# Patient Record
Sex: Female | Born: 1975
Health system: Southern US, Community
[De-identification: ages and names within clinical notes are randomized; demographics above are authoritative.]

## PROBLEM LIST (undated history)

## (undated) DIAGNOSIS — F32A Depression, unspecified: Secondary | ICD-10-CM

## (undated) DIAGNOSIS — J45909 Unspecified asthma, uncomplicated: Secondary | ICD-10-CM

## (undated) DIAGNOSIS — R87629 Unspecified abnormal cytological findings in specimens from vagina: Secondary | ICD-10-CM

## (undated) DIAGNOSIS — G459 Transient cerebral ischemic attack, unspecified: Secondary | ICD-10-CM

## (undated) DIAGNOSIS — F419 Anxiety disorder, unspecified: Secondary | ICD-10-CM

## (undated) DIAGNOSIS — M549 Dorsalgia, unspecified: Secondary | ICD-10-CM

## (undated) DIAGNOSIS — E119 Type 2 diabetes mellitus without complications: Secondary | ICD-10-CM

## (undated) DIAGNOSIS — G43109 Migraine with aura, not intractable, without status migrainosus: Secondary | ICD-10-CM

## (undated) DIAGNOSIS — E785 Hyperlipidemia, unspecified: Secondary | ICD-10-CM

## (undated) DIAGNOSIS — L732 Hidradenitis suppurativa: Secondary | ICD-10-CM

## (undated) HISTORY — DX: Depression, unspecified: F32.A

## (undated) HISTORY — DX: Migraine with aura, not intractable, without status migrainosus: G43.109

## (undated) HISTORY — PX: CERVICAL BIOPSY  W/ LOOP ELECTRODE EXCISION: SUR135

## (undated) HISTORY — DX: Type 2 diabetes mellitus without complications: E11.9

## (undated) HISTORY — PX: LEEP: SHX91

## (undated) HISTORY — DX: Hyperlipidemia, unspecified: E78.5

## (undated) HISTORY — DX: Anxiety disorder, unspecified: F41.9

## (undated) HISTORY — DX: Unspecified abnormal cytological findings in specimens from vagina: R87.629

---

## 1998-01-22 ENCOUNTER — Emergency Department (HOSPITAL_COMMUNITY): Admission: EM | Admit: 1998-01-22 | Discharge: 1998-01-22 | Payer: Self-pay | Admitting: Emergency Medicine

## 1998-07-03 ENCOUNTER — Emergency Department (HOSPITAL_COMMUNITY): Admission: EM | Admit: 1998-07-03 | Discharge: 1998-07-03 | Payer: Self-pay | Admitting: Emergency Medicine

## 1998-09-29 ENCOUNTER — Encounter: Admission: RE | Admit: 1998-09-29 | Discharge: 1998-09-29 | Payer: Self-pay | Admitting: Obstetrics

## 1999-02-11 ENCOUNTER — Inpatient Hospital Stay (HOSPITAL_COMMUNITY): Admission: AD | Admit: 1999-02-11 | Discharge: 1999-02-11 | Payer: Self-pay | Admitting: *Deleted

## 1999-03-19 ENCOUNTER — Inpatient Hospital Stay (HOSPITAL_COMMUNITY): Admission: AD | Admit: 1999-03-19 | Discharge: 1999-03-19 | Payer: Self-pay | Admitting: Obstetrics & Gynecology

## 1999-07-25 ENCOUNTER — Inpatient Hospital Stay (HOSPITAL_COMMUNITY): Admission: AD | Admit: 1999-07-25 | Discharge: 1999-07-25 | Payer: Self-pay | Admitting: Obstetrics & Gynecology

## 1999-07-27 ENCOUNTER — Inpatient Hospital Stay (HOSPITAL_COMMUNITY): Admission: AD | Admit: 1999-07-27 | Discharge: 1999-07-27 | Payer: Self-pay | Admitting: Obstetrics & Gynecology

## 2001-06-25 ENCOUNTER — Emergency Department (HOSPITAL_COMMUNITY): Admission: EM | Admit: 2001-06-25 | Discharge: 2001-06-25 | Payer: Self-pay | Admitting: Emergency Medicine

## 2001-11-07 ENCOUNTER — Encounter: Payer: Self-pay | Admitting: Emergency Medicine

## 2001-11-07 ENCOUNTER — Emergency Department (HOSPITAL_COMMUNITY): Admission: EM | Admit: 2001-11-07 | Discharge: 2001-11-08 | Payer: Self-pay | Admitting: Emergency Medicine

## 2002-03-09 ENCOUNTER — Emergency Department (HOSPITAL_COMMUNITY): Admission: EM | Admit: 2002-03-09 | Discharge: 2002-03-09 | Payer: Self-pay

## 2002-08-07 ENCOUNTER — Emergency Department (HOSPITAL_COMMUNITY): Admission: EM | Admit: 2002-08-07 | Discharge: 2002-08-07 | Payer: Self-pay | Admitting: Emergency Medicine

## 2002-11-22 ENCOUNTER — Emergency Department (HOSPITAL_COMMUNITY): Admission: EM | Admit: 2002-11-22 | Discharge: 2002-11-23 | Payer: Self-pay | Admitting: Emergency Medicine

## 2003-03-08 ENCOUNTER — Emergency Department (HOSPITAL_COMMUNITY): Admission: EM | Admit: 2003-03-08 | Discharge: 2003-03-08 | Payer: Self-pay | Admitting: Emergency Medicine

## 2003-08-03 ENCOUNTER — Encounter: Admission: RE | Admit: 2003-08-03 | Discharge: 2003-08-03 | Payer: Self-pay | Admitting: Obstetrics and Gynecology

## 2003-08-03 ENCOUNTER — Other Ambulatory Visit: Admission: RE | Admit: 2003-08-03 | Discharge: 2003-08-03 | Payer: Self-pay | Admitting: Obstetrics & Gynecology

## 2003-08-03 ENCOUNTER — Encounter (INDEPENDENT_AMBULATORY_CARE_PROVIDER_SITE_OTHER): Payer: Self-pay | Admitting: *Deleted

## 2004-01-16 ENCOUNTER — Emergency Department (HOSPITAL_COMMUNITY): Admission: EM | Admit: 2004-01-16 | Discharge: 2004-01-16 | Payer: Self-pay | Admitting: Family Medicine

## 2004-01-17 ENCOUNTER — Inpatient Hospital Stay (HOSPITAL_COMMUNITY): Admission: AD | Admit: 2004-01-17 | Discharge: 2004-01-17 | Payer: Self-pay | Admitting: Family Medicine

## 2004-02-08 ENCOUNTER — Emergency Department (HOSPITAL_COMMUNITY): Admission: EM | Admit: 2004-02-08 | Discharge: 2004-02-08 | Payer: Self-pay | Admitting: *Deleted

## 2004-06-13 ENCOUNTER — Ambulatory Visit: Payer: Self-pay | Admitting: Obstetrics & Gynecology

## 2004-08-08 ENCOUNTER — Ambulatory Visit: Payer: Self-pay | Admitting: Obstetrics & Gynecology

## 2004-08-08 ENCOUNTER — Other Ambulatory Visit: Admission: RE | Admit: 2004-08-08 | Discharge: 2004-08-08 | Payer: Self-pay | Admitting: Obstetrics & Gynecology

## 2004-08-16 ENCOUNTER — Inpatient Hospital Stay (HOSPITAL_COMMUNITY): Admission: AD | Admit: 2004-08-16 | Discharge: 2004-08-16 | Payer: Self-pay | Admitting: Obstetrics and Gynecology

## 2004-08-22 ENCOUNTER — Ambulatory Visit: Payer: Self-pay | Admitting: Obstetrics and Gynecology

## 2004-09-24 ENCOUNTER — Emergency Department (HOSPITAL_COMMUNITY): Admission: EM | Admit: 2004-09-24 | Discharge: 2004-09-24 | Payer: Self-pay | Admitting: Emergency Medicine

## 2004-12-12 ENCOUNTER — Ambulatory Visit: Payer: Self-pay | Admitting: Obstetrics and Gynecology

## 2005-06-04 ENCOUNTER — Emergency Department (HOSPITAL_COMMUNITY): Admission: EM | Admit: 2005-06-04 | Discharge: 2005-06-05 | Payer: Self-pay | Admitting: Emergency Medicine

## 2005-06-12 ENCOUNTER — Encounter (INDEPENDENT_AMBULATORY_CARE_PROVIDER_SITE_OTHER): Payer: Self-pay | Admitting: *Deleted

## 2005-06-12 ENCOUNTER — Ambulatory Visit: Payer: Self-pay | Admitting: Obstetrics & Gynecology

## 2006-05-14 ENCOUNTER — Emergency Department (HOSPITAL_COMMUNITY): Admission: EM | Admit: 2006-05-14 | Discharge: 2006-05-14 | Payer: Self-pay | Admitting: Emergency Medicine

## 2006-05-16 ENCOUNTER — Inpatient Hospital Stay (HOSPITAL_COMMUNITY): Admission: AD | Admit: 2006-05-16 | Discharge: 2006-05-16 | Payer: Self-pay | Admitting: Obstetrics and Gynecology

## 2006-05-30 ENCOUNTER — Ambulatory Visit: Payer: Self-pay | Admitting: Obstetrics and Gynecology

## 2006-06-26 ENCOUNTER — Ambulatory Visit (HOSPITAL_COMMUNITY): Admission: RE | Admit: 2006-06-26 | Discharge: 2006-06-26 | Payer: Self-pay | Admitting: Obstetrics and Gynecology

## 2006-10-31 ENCOUNTER — Emergency Department (HOSPITAL_COMMUNITY): Admission: EM | Admit: 2006-10-31 | Discharge: 2006-10-31 | Payer: Self-pay | Admitting: Family Medicine

## 2007-02-13 ENCOUNTER — Emergency Department (HOSPITAL_COMMUNITY): Admission: EM | Admit: 2007-02-13 | Discharge: 2007-02-13 | Payer: Self-pay | Admitting: Emergency Medicine

## 2008-11-01 ENCOUNTER — Emergency Department (HOSPITAL_COMMUNITY): Admission: EM | Admit: 2008-11-01 | Discharge: 2008-11-01 | Payer: Self-pay | Admitting: Emergency Medicine

## 2009-07-08 ENCOUNTER — Emergency Department (HOSPITAL_COMMUNITY): Admission: EM | Admit: 2009-07-08 | Discharge: 2009-07-08 | Payer: Self-pay | Admitting: Emergency Medicine

## 2009-07-11 ENCOUNTER — Emergency Department (HOSPITAL_COMMUNITY): Admission: EM | Admit: 2009-07-11 | Discharge: 2009-07-11 | Payer: Self-pay | Admitting: Emergency Medicine

## 2009-08-01 ENCOUNTER — Emergency Department (HOSPITAL_COMMUNITY): Admission: EM | Admit: 2009-08-01 | Discharge: 2009-08-01 | Payer: Self-pay | Admitting: Emergency Medicine

## 2009-11-14 ENCOUNTER — Emergency Department (HOSPITAL_COMMUNITY): Admission: EM | Admit: 2009-11-14 | Discharge: 2009-11-14 | Payer: Self-pay | Admitting: Emergency Medicine

## 2009-11-15 ENCOUNTER — Emergency Department (HOSPITAL_COMMUNITY): Admission: EM | Admit: 2009-11-15 | Discharge: 2009-11-15 | Payer: Self-pay | Admitting: Emergency Medicine

## 2009-12-29 ENCOUNTER — Ambulatory Visit (HOSPITAL_COMMUNITY): Admission: RE | Admit: 2009-12-29 | Discharge: 2009-12-29 | Payer: Self-pay | Admitting: Specialist

## 2010-02-14 ENCOUNTER — Emergency Department (HOSPITAL_COMMUNITY): Admission: EM | Admit: 2010-02-14 | Discharge: 2010-02-14 | Payer: Self-pay | Admitting: Family Medicine

## 2010-08-14 ENCOUNTER — Emergency Department (HOSPITAL_COMMUNITY)
Admission: EM | Admit: 2010-08-14 | Discharge: 2010-08-14 | Payer: Self-pay | Source: Home / Self Care | Admitting: Emergency Medicine

## 2010-08-24 ENCOUNTER — Emergency Department (HOSPITAL_COMMUNITY)
Admission: EM | Admit: 2010-08-24 | Discharge: 2010-08-24 | Payer: Self-pay | Source: Home / Self Care | Admitting: Emergency Medicine

## 2010-09-24 ENCOUNTER — Emergency Department (HOSPITAL_COMMUNITY)
Admission: EM | Admit: 2010-09-24 | Discharge: 2010-09-24 | Payer: Self-pay | Source: Home / Self Care | Admitting: Emergency Medicine

## 2010-09-30 ENCOUNTER — Encounter: Payer: Self-pay | Admitting: *Deleted

## 2010-10-01 ENCOUNTER — Encounter: Payer: Self-pay | Admitting: Specialist

## 2010-10-19 ENCOUNTER — Inpatient Hospital Stay (HOSPITAL_COMMUNITY)
Admission: AD | Admit: 2010-10-19 | Discharge: 2010-10-19 | Disposition: A | Discharge status: Home / Self Care | Payer: Medicaid Other | Source: Ambulatory Visit | Attending: Obstetrics & Gynecology | Admitting: Obstetrics & Gynecology

## 2010-10-19 DIAGNOSIS — R350 Frequency of micturition: Secondary | ICD-10-CM

## 2010-10-19 DIAGNOSIS — A5901 Trichomonal vulvovaginitis: Secondary | ICD-10-CM

## 2010-10-19 DIAGNOSIS — N3090 Cystitis, unspecified without hematuria: Secondary | ICD-10-CM

## 2010-10-19 LAB — URINE MICROSCOPIC-ADD ON

## 2010-10-19 LAB — URINALYSIS, ROUTINE W REFLEX MICROSCOPIC
Ketones, ur: 15 mg/dL — AB
Protein, ur: NEGATIVE mg/dL

## 2010-10-19 LAB — POCT PREGNANCY, URINE: Preg Test, Ur: NEGATIVE

## 2010-12-14 LAB — URINALYSIS, ROUTINE W REFLEX MICROSCOPIC
Bilirubin Urine: NEGATIVE
Glucose, UA: NEGATIVE mg/dL
Ketones, ur: NEGATIVE mg/dL
Protein, ur: NEGATIVE mg/dL
Specific Gravity, Urine: 1.02 (ref 1.005–1.030)

## 2010-12-14 LAB — URINE MICROSCOPIC-ADD ON

## 2010-12-14 LAB — PREGNANCY, URINE: Preg Test, Ur: NEGATIVE

## 2011-01-26 NOTE — Group Therapy Note (Signed)
NAME:  Samantha [name], Samantha [name] NO.:  [id number]   MEDICAL RECORD NO.:  [id number]          PATIENT TYPE:  WOC   LOCATION:  [facility]                   FACILITY:  [facility]   PHYSICIAN:  Elsie Lincoln, MD      DATE OF BIRTH:  December 12, 1975   DATE OF SERVICE:  06/13/2004                                    CLINIC NOTE   REASON FOR VISIT:  The patient is a 35 year old G1 P1-0-0-1 female who was  [facility] for high-grade dysplasia on biopsy.  Pap  history is as follows:  Normal Pap smear July 2001; inflammatory changes  July 2002; inflammation on August 2003; high-grade SIL on November 2004; low-  grade SIL on May 2005; ASC-H on August 2005.  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  Today we discussed  options of LEEP.  The patient understands that there will be an excision of  the cervix and a burning of the margins.  I do not believe this patient is a  good candidate for cryo, given it is a high-grade lesion.  The patient  understands that future pregnancies may be at risk for cervical stenosis or  cervical incompetence.  She also understands that high-grade lesions have  approximately 10% chance of developing cancer in the next 10 years.  The  patient does elect to go ahead with LEEP.  Will schedule in the following  month.      KL/MEDQ  D:  06/13/2004  T:  06/13/2004  Job:  [id number]

## 2011-01-26 NOTE — Group Therapy Note (Signed)
NAME:  Samantha [name], Samantha [name] NO.:  [id number]   MEDICAL RECORD NO.:  [id number]          PATIENT TYPE:  WOC   LOCATION:  [facility]                   FACILITY:  [facility]   PHYSICIAN:  Argentina Donovan, MD        DATE OF BIRTH:  08-11-1976   DATE OF SERVICE:  05/30/2006                                    CLINIC NOTE   The patient is a 35 year old, gravida 1, para 1-0-0-1, with child age 39,  African American female who was recently seen in the [facility]U with the abrupt  onset of right lower quadrant pain.  She was evaluated there and an  ultrasound showed that she had a complex cyst and solid mass of the right  adnexa measuring 3 x 5 x 4.  Diagnosis of that was hydrosalpinx cystic  ovarian neoplasm or endometrioma.  The patient has fairly regular periods,  has been trying to get pregnant for a year without success, with a partner  who has a 92-year-old by another partner, and since she was seen in the MAU,  the pain has gotten much, much better and the patient is almost asymptomatic  at this point.  She [facility] which  was done months after her last normal one, which was done following a LEEP  procedure for CIN I.  I have told her that if this one turns out to be  normal she can go back to once a year on her Pap smears.   PLAN:  1. To follow up the ovarian cyst with an ultrasound in mid October, i.e. 6      weeks after the first.  2. Order a hysterosalpingogram as the patient has regular periods, a      probable fertile partner, and no other significant past history except      for the LEEP, as to why she might not be getting pregnant.  3. We will see her for a follow-up near the end of October following the      hysterosalpingogram and ultrasound.           ______________________________  Argentina Donovan, MD     PR/MEDQ  D:  05/30/2006  T:  06/01/2006  Job:  [id number]

## 2011-01-26 NOTE — Group Therapy Note (Signed)
NAME:  Samantha [name], Samantha [name] NO.:  [id number]   MEDICAL RECORD NO.:  [id number]           PATIENT TYPE:   LOCATION:  [facility]                     FACILITY:   PHYSICIAN:  Elsie Lincoln, MD           DATE OF BIRTH:   DATE OF SERVICE:                                    CLINIC NOTE   ADDENDUM:  job [phone]   PHYSICAL EXAMINATION:  ABDOMEN:  Soft, nontender, nondistended.  Inguinal  region no enlarged lymph nodes.  GENITALIA:  Tanner V.  Vagina pink, no lesions.  Normal rugae.  Cervix  closed, nontender.  Difficult to pass the endocervical brush secondary to  history of a LEEP.  Uterus small, anteverted, nontender.  Adnexa no masses,  nontender.  Bladder nontender.  Urethra nontender.           ______________________________  Elsie Lincoln, MD     KL/MEDQ  D:  06/12/2005  T:  06/13/2005  Job:  [id number]

## 2011-01-26 NOTE — Group Therapy Note (Signed)
NAME:  Samantha [name], Samantha [name] NO.:  [id number]   MEDICAL RECORD NO.:  [id number]          PATIENT TYPE:  WOC   LOCATION:  [facility]                   FACILITY:  [facility]   PHYSICIAN:  Argentina Donovan, MD        DATE OF BIRTH:  Aug 18, 1976   DATE OF SERVICE:  12/12/2004                                    CLINIC NOTE   CHIEF COMPLAINT:  Followup Pap smear from LEEP procedure done in November of  2005.   HISTORY OF PRESENT ILLNESS:  The patient is in today to get a followup Pap  smear.  She had a LEEP procedure done in November which showed low-grade  squamous intraepithelial lesion and margins free of dysplasia.  At that  time, it was recommended that she undergo routine Pap smear testing, and  that is why she is returning to clinic today.  She also complained today of  lower abdominal pain and urinary frequency and also some vaginal discharge.  She denies recent period.  She is on the Depo shot.  Denies fevers or  chills.  Denies nausea, vomiting, diarrhea or constipation.  Nothing seems  to make her pain better.  Nothing seems to make it worse.  She describes a  sharp, tight pain that is described in her lower abdomen, pointing to the  suprapubic region.   PHYSICAL EXAMINATION:  GENERAL:  This is a well-developed, well-nourished  African-American female in no acute distress.  CARDIOVASCULAR:  Regular rate and rhythm.  No murmurs, rubs or gallops  noted.  LUNGS:  Clear to auscultation bilaterally.  ABDOMEN:  Soft, nontender, and nondistended with normoactive bowel sounds.  PELVIC:  On speculum exam, the patient has normal vaginal mucosa with normal  rugations.  Her cervix appears to have changes consistent with being status  post LEEP, but no lesions noted today.  She has no discharge from the  cervical os.  When attempting to test for GC and chlamydia, it was noted  that the os was tightly closed, and this was difficult to collect.  A Pap  smear, wet prep and GC and  chlamydia are collected as well as a urinalysis.   ASSESSMENT:  1.  Repeat Pap.  Given the patient's history, the repeat Pap is considered      prudent at this time.  We have collected this, and we will update her      with the results when they become available.  2.  Urinary frequency, and abdominal pain.  I am concerned that she may have      a urinary tract infection or some vaginal type of infection.  Therefore,      I have collected the urinalysis as well as the GC, chlamydia and wet      prep.  These results should be available tomorrow with the exception of      the GC and chlamydia, and the patient is advised to call back at that      time for the results.  She will do this, and any needed medications will      be called  in for her.  3.  Pre-pregnancy planning.  The patient has begun to think about      considering a pregnancy.  She has some questions about this relating to      her LEEP procedure.  I have advised her to return either to family      planning or to make an appointment here for pre-pregnancy counseling.   DICTATED BY:  Ursula Beath, MD.      PR/MEDQ  D:  12/12/2004  T:  12/12/2004  Job:  [id number]
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
## 2011-01-29 ENCOUNTER — Emergency Department (HOSPITAL_COMMUNITY)
Admission: EM | Admit: 2011-01-29 | Discharge: 2011-01-29 | Discharge status: Against Medical Advice | Payer: Medicaid Other | Attending: Emergency Medicine | Admitting: Emergency Medicine

## 2011-01-29 DIAGNOSIS — R51 Headache: Secondary | ICD-10-CM | POA: Insufficient documentation

## 2011-01-29 DIAGNOSIS — R1110 Vomiting, unspecified: Secondary | ICD-10-CM | POA: Insufficient documentation

## 2011-01-29 DIAGNOSIS — R5381 Other malaise: Secondary | ICD-10-CM | POA: Insufficient documentation

## 2011-04-26 ENCOUNTER — Inpatient Hospital Stay (HOSPITAL_COMMUNITY)
Admission: AD | Admit: 2011-04-26 | Discharge: 2011-04-27 | Disposition: A | Discharge status: Home / Self Care | Payer: Medicaid Other | Source: Ambulatory Visit | Attending: Obstetrics & Gynecology | Admitting: Obstetrics & Gynecology

## 2011-04-26 ENCOUNTER — Encounter (HOSPITAL_COMMUNITY): Payer: Self-pay | Admitting: *Deleted

## 2011-04-26 DIAGNOSIS — N760 Acute vaginitis: Secondary | ICD-10-CM | POA: Insufficient documentation

## 2011-04-26 DIAGNOSIS — A084 Viral intestinal infection, unspecified: Secondary | ICD-10-CM | POA: Diagnosis present

## 2011-04-26 DIAGNOSIS — B9689 Other specified bacterial agents as the cause of diseases classified elsewhere: Secondary | ICD-10-CM | POA: Insufficient documentation

## 2011-04-26 DIAGNOSIS — A499 Bacterial infection, unspecified: Secondary | ICD-10-CM | POA: Insufficient documentation

## 2011-04-26 DIAGNOSIS — A088 Other specified intestinal infections: Secondary | ICD-10-CM | POA: Insufficient documentation

## 2011-04-26 DIAGNOSIS — R109 Unspecified abdominal pain: Secondary | ICD-10-CM | POA: Insufficient documentation

## 2011-04-26 DIAGNOSIS — N730 Acute parametritis and pelvic cellulitis: Secondary | ICD-10-CM

## 2011-04-26 HISTORY — DX: Dorsalgia, unspecified: M54.9

## 2011-04-26 LAB — URINALYSIS, ROUTINE W REFLEX MICROSCOPIC
Ketones, ur: NEGATIVE mg/dL
Protein, ur: NEGATIVE mg/dL
Urobilinogen, UA: 1 mg/dL (ref 0.0–1.0)

## 2011-04-26 LAB — CBC
MCH: 31 pg (ref 26.0–34.0)
MCHC: 33.7 g/dL (ref 30.0–36.0)
WBC: 20.3 10*3/uL — ABNORMAL HIGH (ref 4.0–10.5)

## 2011-04-26 LAB — POCT PREGNANCY, URINE: Preg Test, Ur: NEGATIVE

## 2011-04-26 LAB — URINE MICROSCOPIC-ADD ON

## 2011-04-26 MED ORDER — DIPHENOXYLATE-ATROPINE 2.5-0.025 MG PO TABS
2.0000 | ORAL_TABLET | Freq: Once | ORAL | Status: AC
  Administered 2011-04-26: 2 via ORAL
  Filled 2011-04-26: qty 2

## 2011-04-26 MED ORDER — ONDANSETRON 8 MG PO TBDP
8.0000 mg | ORAL_TABLET | Freq: Once | ORAL | Status: AC
  Administered 2011-04-26: 8 mg via ORAL
  Filled 2011-04-26: qty 1

## 2011-04-26 MED ORDER — IBUPROFEN 800 MG PO TABS
800.0000 mg | ORAL_TABLET | Freq: Once | ORAL | Status: AC
  Administered 2011-04-26: 800 mg via ORAL
  Filled 2011-04-26: qty 1

## 2011-04-26 MED ORDER — ACETAMINOPHEN 500 MG PO TABS
1000.0000 mg | ORAL_TABLET | Freq: Once | ORAL | Status: AC
  Administered 2011-04-26: 1000 mg via ORAL
  Filled 2011-04-26: qty 2

## 2011-04-26 NOTE — Progress Notes (Signed)
Pt in c/o lower abdominal cramping since 2 days ago.  States pain has progressively gotten worse.  Reports nausea and vomiting x2 days.  Reports fever of a 100.6 since 1930.  Took advil this morning, did not help. LMP was last Monday- Friday, had sex on Saturday and condom broke, reports bleeding x3 days after that.  No bleeding or discharge presently.

## 2011-04-26 NOTE — Progress Notes (Signed)
PT  SAYS HAD CYCLE ON 8-6- THRU 8-10  THEN STOPPED . HAD SEX ON 8-11- CONDOM BROKE . BEGAN BLEEDING AGAIN  ON Sunday 8-12 UNTIL YESTERDAY. TODAY - NO BLEEDING.  HAS CRAMPS- STARTED YESTERDAY AND NAUSEA. . NO VOMITING.  SAYS HER TEMP  AT 8PM WAS 100.6-   TOOK ADVIL THIS AM- NO RELIEF.

## 2011-04-27 DIAGNOSIS — A084 Viral intestinal infection, unspecified: Secondary | ICD-10-CM | POA: Diagnosis present

## 2011-04-27 LAB — GC/CHLAMYDIA PROBE AMP, URINE: GC Probe Amp, Urine: NEGATIVE

## 2011-04-27 MED ORDER — PROMETHAZINE HCL 12.5 MG PO TABS: 25.0000 mg | ORAL_TABLET | Freq: Four times a day (QID) | ORAL | Status: DC | PRN

## 2011-04-27 MED ORDER — DIPHENOXYLATE-ATROPINE 2.5-0.025 MG PO TABS: 1.0000 | ORAL_TABLET | Freq: Four times a day (QID) | ORAL | Status: AC | PRN

## 2011-04-27 MED ORDER — ACETAMINOPHEN 500 MG PO TABS: 500.0000 mg | ORAL_TABLET | Freq: Four times a day (QID) | ORAL | Status: AC | PRN

## 2011-04-27 MED ORDER — METRONIDAZOLE 500 MG PO TABS: 500.0000 mg | ORAL_TABLET | Freq: Two times a day (BID) | ORAL | Status: AC

## 2011-04-27 MED ORDER — DOXYCYCLINE HYCLATE 50 MG PO CAPS: 100.0000 mg | ORAL_CAPSULE | Freq: Two times a day (BID) | ORAL | Status: AC

## 2011-04-27 MED ORDER — IBUPROFEN 100 MG PO TABS: 800.0000 mg | ORAL_TABLET | Freq: Three times a day (TID) | ORAL | Status: DC | PRN

## 2011-04-27 NOTE — ED Provider Notes (Signed)
History     Chief Complaint  Patient presents with  . Abdominal Pain   HPI N/V/D x 2 days. + abdominal pain. Fever today of 100.6. Took Ibuprofen for pain this morning, no other meds. No vaginal bleeding or discharge.    OB History    Grav Para Term Preterm Abortions TAB SAB Ect Mult Living   2    1  1   1       Past Medical History  Diagnosis Date  . Back pain     Past Surgical History  Procedure Date  . No past surgeries     No family history on file.  History  Substance Use Topics  . Smoking status: Current Everyday Smoker    Types: Cigars  . Smokeless tobacco: Not on file  . Alcohol Use: Yes     occasionally    Allergies: No Known Allergies  Prescriptions prior to admission  Medication Sig Dispense Refill  . ibuprofen (ADVIL,MOTRIN) 100 MG tablet Take 100 mg by mouth every 6 (six) hours as needed.          Review of Systems  Constitutional: Positive for fever, chills and malaise/fatigue.  HENT: Negative.   Eyes: Negative.   Respiratory: Negative.   Cardiovascular: Negative.   Gastrointestinal: Positive for nausea, vomiting, abdominal pain and diarrhea. Negative for constipation and blood in stool.  Genitourinary: Negative.   Musculoskeletal: Negative.   Neurological: Negative.   Psychiatric/Behavioral: Negative.    Physical Exam   Blood pressure 114/66, pulse 91, temperature 98.6 F (37 C), temperature source Oral, resp. rate 18, height 5\' 4"  (1.626 m), weight 67.359 kg (148 lb 8 oz), last menstrual period 04/16/2011.  Physical Exam  Constitutional: She appears well-developed and well-nourished. She appears distressed.  Cardiovascular: Normal rate, regular rhythm and normal heart sounds.   Respiratory: Effort normal and breath sounds normal.  GI: Soft. She exhibits no distension and no mass. Bowel sounds are increased. There is no tenderness. There is no rebound and no guarding.  Genitourinary: There is no rash or lesion on the right labia. There  is no rash or lesion on the left labia. Uterus is tender. Uterus is not enlarged. Right adnexum displays tenderness. Right adnexum displays no mass and no fullness. Left adnexum displays tenderness. Left adnexum displays no mass and no fullness. No bleeding around the vagina. Vaginal discharge found.    MAU Course  Procedures Results for orders placed during the hospital encounter of 04/26/11 (from the past 24 hour(s))  URINALYSIS, ROUTINE W REFLEX MICROSCOPIC     Status: Abnormal   Collection Time   04/26/11  9:18 PM      Component Value Range   Color, Urine YELLOW  YELLOW    Appearance CLEAR  CLEAR    Specific Gravity, Urine 1.020  1.005 - 1.030    pH 6.0  5.0 - 8.0    Glucose, UA NEGATIVE  NEGATIVE (mg/dL)   Hgb urine dipstick MODERATE (*) NEGATIVE    Bilirubin Urine NEGATIVE  NEGATIVE    Ketones, ur NEGATIVE  NEGATIVE (mg/dL)   Protein, ur NEGATIVE  NEGATIVE (mg/dL)   Urobilinogen, UA 1.0  0.0 - 1.0 (mg/dL)   Nitrite NEGATIVE  NEGATIVE    Leukocytes, UA NEGATIVE  NEGATIVE   URINE MICROSCOPIC-ADD ON     Status: Abnormal   Collection Time   04/26/11  9:18 PM      Component Value Range   Squamous Epithelial / LPF RARE  RARE  WBC, UA 0-2  <3 (WBC/hpf)   RBC / HPF 3-6  <3 (RBC/hpf)   Bacteria, UA FEW (*) RARE   CBC     Status: Abnormal   Collection Time   04/26/11 10:05 PM      Component Value Range   WBC 20.3 (*) 4.0 - 10.5 (K/uL)   RBC 4.48  3.87 - 5.11 (MIL/uL)   Hemoglobin 13.9  12.0 - 15.0 (g/dL)   HCT 46.9  62.9 - 52.8 (%)   MCV 92.2  78.0 - 100.0 (fL)   MCH 31.0  26.0 - 34.0 (pg)   MCHC 33.7  30.0 - 36.0 (g/dL)   RDW 41.3  24.4 - 01.0 (%)   Platelets 225  150 - 400 (K/uL)  POCT PREGNANCY, URINE     Status: Normal   Collection Time   04/26/11 10:07 PM      Component Value Range   Preg Test, Ur NEGATIVE      Offered pt pelvic u/s d/t adnexal tenderness, pt declined d/t transportation  Assessment and Plan  35 yo female with viral gastroenteritis and BV, ? PID Rx  as below F/U at [facility] if GI symptoms worsening, otherwise f/u with PCP Medication List  As of 04/27/2011  3:31 AM   START taking these medications         acetaminophen 500 MG tablet   Commonly known as: TYLENOL   Take 1 tablet (500 mg total) by mouth every 6 (six) hours as needed for pain or fever.      diphenoxylate-atropine 2.5-0.025 MG per tablet   Commonly known as: LOMOTIL   Take 1 tablet by mouth 4 (four) times daily as needed for diarrhea/loose stools.      doxycycline 50 MG capsule   Commonly known as: VIBRAMYCIN   Take 2 capsules (100 mg total) by mouth 2 (two) times daily.      ibuprofen 100 MG tablet   Commonly known as: ADVIL,MOTRIN   Take 8 tablets (800 mg total) by mouth every 8 (eight) hours as needed for pain or fever.      metroNIDAZOLE 500 MG tablet   Commonly known as: FLAGYL   Take 1 tablet (500 mg total) by mouth 2 (two) times daily.      promethazine 12.5 MG tablet   Commonly known as: PHENERGAN   Take 2 tablets (25 mg total) by mouth every 6 (six) hours as needed for nausea.          Where to get your medications    These are the prescriptions that you need to pick up.   You may get these medications from any pharmacy.         acetaminophen 500 MG tablet   doxycycline 50 MG capsule   ibuprofen 100 MG tablet   metroNIDAZOLE 500 MG tablet   promethazine 12.5 MG tablet         Information on where to get these meds is not yet available. Ask your nurse or doctor.         diphenoxylate-atropine 2.5-0.025 MG per tablet             Nevan Creighton 04/27/2011, 12:41 AM

## 2011-11-22 ENCOUNTER — Inpatient Hospital Stay (HOSPITAL_COMMUNITY): Payer: Medicaid Other

## 2011-11-22 ENCOUNTER — Inpatient Hospital Stay (HOSPITAL_COMMUNITY)
Admission: AD | Admit: 2011-11-22 | Discharge: 2011-11-22 | Disposition: A | Discharge status: Home / Self Care | Payer: Medicaid Other | Source: Ambulatory Visit | Attending: Obstetrics & Gynecology | Admitting: Obstetrics & Gynecology

## 2011-11-22 ENCOUNTER — Encounter (HOSPITAL_COMMUNITY): Payer: Self-pay | Admitting: *Deleted

## 2011-11-22 DIAGNOSIS — N938 Other specified abnormal uterine and vaginal bleeding: Secondary | ICD-10-CM | POA: Insufficient documentation

## 2011-11-22 DIAGNOSIS — R109 Unspecified abdominal pain: Secondary | ICD-10-CM | POA: Insufficient documentation

## 2011-11-22 DIAGNOSIS — N760 Acute vaginitis: Secondary | ICD-10-CM | POA: Insufficient documentation

## 2011-11-22 DIAGNOSIS — B9689 Other specified bacterial agents as the cause of diseases classified elsewhere: Secondary | ICD-10-CM | POA: Insufficient documentation

## 2011-11-22 DIAGNOSIS — N949 Unspecified condition associated with female genital organs and menstrual cycle: Secondary | ICD-10-CM | POA: Insufficient documentation

## 2011-11-22 DIAGNOSIS — A499 Bacterial infection, unspecified: Secondary | ICD-10-CM | POA: Insufficient documentation

## 2011-11-22 DIAGNOSIS — N739 Female pelvic inflammatory disease, unspecified: Secondary | ICD-10-CM | POA: Insufficient documentation

## 2011-11-22 LAB — URINALYSIS, ROUTINE W REFLEX MICROSCOPIC
Ketones, ur: NEGATIVE mg/dL
Leukocytes, UA: NEGATIVE
Nitrite: NEGATIVE
Specific Gravity, Urine: >1.03 — ABNORMAL HIGH (ref 1.005–1.030)
Urobilinogen, UA: 0.2 mg/dL (ref 0.0–1.0)
pH: 6 (ref 5.0–8.0)

## 2011-11-22 LAB — WET PREP, GENITAL: Yeast Wet Prep HPF POC: NONE SEEN

## 2011-11-22 LAB — CBC
MCH: 30.7 pg (ref 26.0–34.0)
MCV: 93.1 fL (ref 78.0–100.0)
Platelets: 223 10*3/uL (ref 150–400)
RBC: 4.2 MIL/uL (ref 3.87–5.11)
RDW: 13.1 % (ref 11.5–15.5)
WBC: 12.9 10*3/uL — ABNORMAL HIGH (ref 4.0–10.5)

## 2011-11-22 LAB — URINE MICROSCOPIC-ADD ON

## 2011-11-22 MED ORDER — KETOROLAC TROMETHAMINE 60 MG/2ML IM SOLN
60.0000 mg | Freq: Once | INTRAMUSCULAR | Status: AC
  Administered 2011-11-22: 60 mg via INTRAMUSCULAR
  Filled 2011-11-22: qty 2

## 2011-11-22 MED ORDER — DOXYCYCLINE HYCLATE 100 MG PO TABS: 100.0000 mg | ORAL_TABLET | Freq: Two times a day (BID) | ORAL | Status: AC

## 2011-11-22 MED ORDER — METRONIDAZOLE 500 MG PO TABS: 500.0000 mg | ORAL_TABLET | Freq: Two times a day (BID) | ORAL | Status: AC

## 2011-11-22 MED ORDER — CEFTRIAXONE SODIUM 250 MG IJ SOLR
250.0000 mg | Freq: Once | INTRAMUSCULAR | Status: AC
  Administered 2011-11-22: 250 mg via INTRAMUSCULAR
  Filled 2011-11-22: qty 250

## 2011-11-22 NOTE — MAU Note (Signed)
Pt in c/o sharp pains in lower abdomen and lower back.  States pain started last week but went away, returned 2 days ago stronger.  LMP 10/12/11.  Reports small amount of bleeding that started on way to MAU.  Denies any discharge.

## 2011-11-22 NOTE — MAU Provider Note (Signed)
Attestation of Attending Supervision of Advanced Practitioner: Evaluation and management procedures were performed by the PA/NP/CNM/OB Fellow under my supervision/collaboration. Chart reviewed, and agree with management and plan.  Jaynie Collins, M.D. 11/22/2011 8:43 PM

## 2011-11-22 NOTE — MAU Provider Note (Signed)
History     CSN: [id number]  Arrival date and time: 11/22/11 1602 Pt is not pregnant with PMP 10/12/2011 and LMP onset today.  She has been having menses frequently every couple of weeks for several months. She has been having lower back pain radiating around to her sides for 5 days.  She has been nauseated.  She has taken Tylenol 2 tables and today at work was given APAP-Parabrom-Pyrilamine. She has had some vaginal discharge.  She had IC on Sat and pain got better on Sunday and restarted yesterday.  The pain is worse when she walks.  The pain is better when she is still.   She denies UTI symptoms.  She had a regular BM 3 days ago.  She feels a lot of pressure now and feels like she needs to use the bathroom but doesn't because of the pain.  She has a history of Gonorrhea and Chlamydia.  She had chills on Saturday.  She has had nausea and vomited 1 time today around 2 pm.    No chief complaint on file.  HPI    Past Medical History  Diagnosis Date  . Back pain     Past Surgical History  Procedure Date  . No past surgeries     No family history on file.  History  Substance Use Topics  . Smoking status: Current Everyday Smoker    Types: Cigars  . Smokeless tobacco: Not on file  . Alcohol Use: Yes     occasionally    Allergies: No Known Allergies  Prescriptions prior to admission  Medication Sig Dispense Refill  . APAP-Parabrom-Pyrilamine 500-25-15 MG TABS Take 1 tablet by mouth every 6 (six) hours as needed. Patient states that her boss gave them to her.        Review of Systems  Constitutional: Positive for chills. Negative for fever.  Gastrointestinal: Positive for nausea and abdominal pain. Negative for vomiting, diarrhea and constipation.  Genitourinary: Negative for dysuria, urgency, frequency, hematuria and flank pain.   Physical Exam   Blood pressure 111/70, pulse 96, temperature 98.1 F (36.7 C), temperature source Oral, resp. rate 18, height 5' 4.5" (1.638 m),  weight 133 lb (60.328 kg), last menstrual period 11/22/2011, SpO2 100.00%.  Physical Exam  Vitals reviewed. Constitutional: She is oriented to person, place, and time. She appears well-developed and well-nourished.  HENT:  Head: Normocephalic.  Eyes: Pupils are equal, round, and reactive to light.  Neck: Normal range of motion. Neck supple.  Cardiovascular: Normal rate.   Respiratory: Effort normal.  GI: Soft. She exhibits no distension. There is no tenderness. There is no rebound and no guarding.  Genitourinary:       Mod amount of BRB in vault; cervix appears nullip-hx of C/S, NT; uterus NSSC NT; left adnexal tenderness without rebound; right adnexal minimallly tender- without appreciable enlargement  Musculoskeletal: Normal range of motion.  Neurological: She is alert and oriented to person, place, and time.  Skin: Skin is warm and dry.  Psychiatric: She has a normal mood and affect.    MAU Course  Procedures Results for orders placed during the hospital encounter of 11/22/11 (from the past 24 hour(s))  URINALYSIS, ROUTINE W REFLEX MICROSCOPIC     Status: Abnormal   Collection Time   11/22/11  4:20 PM      Component Value Range   Color, Urine AMBER (*) YELLOW    APPearance HAZY (*) CLEAR    Specific Gravity, Urine >1.030 (*) 1.005 - 1.030  pH 6.0  5.0 - 8.0    Glucose, UA NEGATIVE  NEGATIVE (mg/dL)   Hgb urine dipstick LARGE (*) NEGATIVE    Bilirubin Urine SMALL (*) NEGATIVE    Ketones, ur NEGATIVE  NEGATIVE (mg/dL)   Protein, ur NEGATIVE  NEGATIVE (mg/dL)   Urobilinogen, UA 0.2  0.0 - 1.0 (mg/dL)   Nitrite NEGATIVE  NEGATIVE    Leukocytes, UA NEGATIVE  NEGATIVE   URINE MICROSCOPIC-ADD ON     Status: Abnormal   Collection Time   11/22/11  4:20 PM      Component Value Range   Squamous Epithelial / LPF RARE  RARE    WBC, UA 0-2  <3 (WBC/hpf)   RBC / HPF 3-6  <3 (RBC/hpf)   Bacteria, UA FEW (*) RARE    Urine-Other MUCOUS PRESENT    POCT PREGNANCY, URINE     Status:  Normal   Collection Time   11/22/11  4:28 PM      Component Value Range   Preg Test, Ur NEGATIVE  NEGATIVE   CBC     Status: Abnormal   Collection Time   11/22/11  4:56 PM      Component Value Range   WBC 12.9 (*) 4.0 - 10.5 (K/uL)   RBC 4.20  3.87 - 5.11 (MIL/uL)   Hemoglobin 12.9  12.0 - 15.0 (g/dL)   HCT 16.1  09.6 - 04.5 (%)   MCV 93.1  78.0 - 100.0 (fL)   MCH 30.7  26.0 - 34.0 (pg)   MCHC 33.0  30.0 - 36.0 (g/dL)   RDW 40.9  81.1 - 91.4 (%)   Platelets 223  150 - 400 (K/uL)  WET PREP, GENITAL     Status: Abnormal   Collection Time   11/22/11  5:45 PM      Component Value Range   Yeast Wet Prep HPF POC NONE SEEN  NONE SEEN    Trich, Wet Prep NONE SEEN  NONE SEEN    Clue Cells Wet Prep HPF POC FEW (*) NONE SEEN    WBC, Wet Prep HPF POC FEW (*) NONE SEEN    Toradol 60 mg IM given with pain relief to 2/10 Clinical Data: Pelvic pain. Abnormal uterine bleeding.  Leukocytosis. Negative pregnancy test. LMP 11/22/2011.  TRANSABDOMINAL AND TRANSVAGINAL ULTRASOUND OF PELVIS  Technique: Both transabdominal and transvaginal ultrasound  examinations of the pelvis were performed. Transabdominal  technique was performed for global imaging of the pelvis including  uterus, ovaries, adnexal regions, and pelvic cul-de-sac.  It was necessary to proceed with endovaginal exam following the  transabdominal exam to visualize the endometrium and cystic adnexal  masses.  Comparison: 06/26/2006  Findings:  Uterus: 7.9 x 4.1 x 4.8 cm. Retroverted. No fibroids identified.  Several small cystic foci are seen in the inner myometrial  junctional zone, consistent with adenomyosis.  Endometrium: Double layer thickness measures 7 mm transvaginally.  Right ovary: 4.2 x 2.6 x 2.4 cm. The ovary is normal in  appearance. There is a moderate hydrosalpinx seen adjacent to the  ovary which contains low-level internal echoes, slowly increased  since previous study. This is consistent with a chronic hydro-pyo-    salpinx.  Left ovary: Is difficult to visualize. A new fluid-filled tubular  structure containing low level internal echoes is seen involving  the ovary and adnexa, which is also consistent with a hydro-pyo-  salpinx.  Other Findings: No free fluid  IMPRESSION:  1. New left hydro-pyo-salpinx, and mild increase in chronic  right  hydro-pyo-salpinx, consistent with pelvic inflammatory disease.  2. Retroverted uterus with adenomyosis. No fibroids identified.  Original Report Authenticated By: Danae Orleans, M.D.            External Result Report     Assessment and Plan  PID- Rocephin 250 mg Im in MAU Prescription for Doxycycline 100 mg BID for 10 days BV- prescription for Flagyl 500 mg BID for 7 days AUB and infertility F/u in GYN clinic  [facility] 11/22/2011, 4:42 PM

## 2011-11-22 NOTE — MAU Note (Signed)
Sharp pains in abd and back, started 2 days ago.  Has taken ibuprofen, not helped.  Threw up earlier shortly after taking pain medication.  Has been getting hot and breaking into sweat.

## 2011-11-22 NOTE — Discharge Instructions (Signed)
Bacterial Vaginosis Bacterial vaginosis (BV) is a vaginal infection where the normal balance of bacteria in the vagina is disrupted. The normal balance is then replaced by an overgrowth of certain bacteria. There are several different kinds of bacteria that can cause BV. BV is the most common vaginal infection in women of childbearing age. CAUSES   The cause of BV is not fully understood. BV develops when there is an increase or imbalance of harmful bacteria.   Some activities or behaviors can upset the normal balance of bacteria in the vagina and put women at increased risk including:   Having a new sex partner or multiple sex partners.   Douching.   Using an intrauterine device (IUD) for contraception.   It is not clear what role sexual activity plays in the development of BV. However, women that have never had sexual intercourse are rarely infected with BV.  Women do not get BV from toilet seats, bedding, swimming pools or from touching objects around them.  SYMPTOMS   Grey vaginal discharge.   A fish-like odor with discharge, especially after sexual intercourse.   Itching or burning of the vagina and vulva.   Burning or pain with urination.   Some women have no signs or symptoms at all.  DIAGNOSIS  Your caregiver must examine the vagina for signs of BV. Your caregiver will perform lab tests and look at the sample of vaginal fluid through a microscope. They will look for bacteria and abnormal cells (clue cells), a pH test higher than 4.5, and a positive amine test all associated with BV.  RISKS AND COMPLICATIONS   Pelvic inflammatory disease (PID).   Infections following gynecology surgery.   Developing HIV.   Developing herpes virus.  TREATMENT  Sometimes BV will clear up without treatment. However, all women with symptoms of BV should be treated to avoid complications, especially if gynecology surgery is planned. Female partners generally do not need to be treated. However,  BV may spread between female sex partners so treatment is helpful in preventing a recurrence of BV.   BV may be treated with antibiotics. The antibiotics come in either pill or vaginal cream forms. Either can be used with nonpregnant or pregnant women, but the recommended dosages differ. These antibiotics are not harmful to the baby.   BV can recur after treatment. If this happens, a second round of antibiotics will often be prescribed.   Treatment is important for pregnant women. If not treated, BV can cause a premature delivery, especially for a pregnant woman who had a premature birth in the past. All pregnant women who have symptoms of BV should be checked and treated.   For chronic reoccurrence of BV, treatment with a type of prescribed gel vaginally twice a week is helpful.  HOME CARE INSTRUCTIONS   Finish all medication as directed by your caregiver.   Do not have sex until treatment is completed.   Tell your sexual partner that you have a vaginal infection. They should see their caregiver and be treated if they have problems, such as a mild rash or itching.   Practice safe sex. Use condoms. Only have 1 sex partner.  PREVENTION  Basic prevention steps can help reduce the risk of upsetting the natural balance of bacteria in the vagina and developing BV:  Do not have sexual intercourse (be abstinent).   Do not douche.   Use all of the medicine prescribed for treatment of BV, even if the signs and symptoms go away.     Tell your sex partner if you have BV. That way, they can be treated, if needed, to prevent reoccurrence.  SEEK MEDICAL CARE IF:   Your symptoms are not improving after 3 days of treatment.   You have increased discharge, pain, or fever.  MAKE SURE YOU:   Understand these instructions.   Will watch your condition.   Will get help right away if you are not doing well or get worse.  FOR MORE INFORMATION  Division of STD Prevention (DSTDP), Centers for Disease  Control and Prevention: www.cdc.gov/std American [employer] Health Association (ASHA): www.ashastd.org  Document Released: 08/27/2005 Document Revised: 08/16/2011 Document Reviewed: 02/17/2009 Exit[employer] Patient Information 2012 [employer]. 

## 2011-12-20 ENCOUNTER — Ambulatory Visit (INDEPENDENT_AMBULATORY_CARE_PROVIDER_SITE_OTHER): Payer: Medicaid Other | Admitting: Advanced Practice Midwife

## 2011-12-20 ENCOUNTER — Other Ambulatory Visit (HOSPITAL_COMMUNITY)
Admission: RE | Admit: 2011-12-20 | Discharge: 2011-12-20 | Disposition: A | Discharge status: Home / Self Care | Payer: Medicaid Other | Source: Ambulatory Visit | Attending: Advanced Practice Midwife | Admitting: Advanced Practice Midwife

## 2011-12-20 ENCOUNTER — Encounter: Payer: Self-pay | Admitting: Advanced Practice Midwife

## 2011-12-20 VITALS — BP 125/80 | HR 63 | Temp 98.3°F | Ht 64.0 in | Wt 134.1 lb

## 2011-12-20 DIAGNOSIS — Z87898 Personal history of other specified conditions: Secondary | ICD-10-CM

## 2011-12-20 DIAGNOSIS — Z01419 Encounter for gynecological examination (general) (routine) without abnormal findings: Secondary | ICD-10-CM | POA: Insufficient documentation

## 2011-12-20 DIAGNOSIS — N920 Excessive and frequent menstruation with regular cycle: Secondary | ICD-10-CM

## 2011-12-20 DIAGNOSIS — Z8742 Personal history of other diseases of the female genital tract: Secondary | ICD-10-CM

## 2011-12-20 DIAGNOSIS — Z124 Encounter for screening for malignant neoplasm of cervix: Secondary | ICD-10-CM

## 2011-12-20 DIAGNOSIS — Z113 Encounter for screening for infections with a predominantly sexual mode of transmission: Secondary | ICD-10-CM | POA: Insufficient documentation

## 2011-12-20 LAB — CBC
Hemoglobin: 12.5 g/dL (ref 12.0–15.0)
MCH: 30.4 pg (ref 26.0–34.0)
MCHC: 32.6 g/dL (ref 30.0–36.0)
Platelets: 251 10*3/uL (ref 150–400)
RDW: 13.9 % (ref 11.5–15.5)

## 2011-12-20 LAB — TSH: TSH: 0.918 u[IU]/mL (ref 0.350–4.500)

## 2011-12-20 LAB — POCT PREGNANCY, URINE: Preg Test, Ur: NEGATIVE

## 2011-12-20 MED ORDER — MEDROXYPROGESTERONE ACETATE 150 MG/ML IM SUSP
150.0000 mg | Freq: Once | INTRAMUSCULAR | Status: AC
  Administered 2011-12-20: 150 mg via INTRAMUSCULAR

## 2011-12-20 NOTE — Progress Notes (Signed)
Subjective:     Samantha [name] is a 36 y.o. female here for a routine exam.  Current complaints: New onset menorrhagia, passing clots and longer duration of menses (7-10 days of bleeding vs 3-5 before) x ~3 cycles, poor appetite, unexplained weight loss. Here for F/U from MAU visit 11/22/11 for PID, Tx outpt. Pt states abd pain, fever and chills have resolved. She completed the course of ABX, but vomited a few doses.   Gynecologic History Patient's last menstrual period was 11/22/2011. Contraception: condoms and Last IC 3 weeks ago Last Pap: < 1 year ago. To inadequate specimen. Did not F/U as instructed. Hx HSIL ~8 years ago, LEEP per pt. 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 Last mammogram: NA. Results were: NA LMP 12/20/11  Obstetric History OB History    Grav Para Term Preterm Abortions TAB SAB Ect Mult Living   2    1  1   1      # Outc Date GA Lbr Len/2nd Wgt Sex Del Anes PTL Lv   1 GRA            2 SAB                The following portions of the patient's history were reviewed and updated as appropriate: allergies, current medications, past medical history, past social history, past surgical history and problem list.  Review of Systems Pertinent items are noted in HPI.    Objective:    BP 125/80  Pulse 63  Temp(Src) 98.3 F (36.8 C) (Oral)  Ht 5\' 4"  (1.626 m)  Wt 134 lb 1.6 oz (60.827 kg)  BMI 23.02 kg/m2  LMP 11/22/2011 General appearance: alert, cooperative and no distress Neck: thyroid not enlarged, symmetric, no tenderness/mass/nodules Lungs: clear to auscultation bilaterally Heart: regular rate and rhythm, S1, S2 normal, no murmur, click, rub or gallop Abdomen: soft, non-tender; bowel sounds normal; no masses,  no organomegaly Pelvic: cervix normal in appearance, no adnexal masses or tenderness, no cervical motion tenderness, uterus normal size, shape, and consistency and vagina normal without discharge   Initially scant brown blood in vault. After cytobrush introduced into os large  amount of serosanguinous, odorless fluid followed by frank DRB flowed out.   Assessment:   1. Menorrhagia  Pregnancy, urine POC, medroxyPROGESTERone (DEPO-PROVERA) injection 150 mg, CBC, TSH  2. Cervical cancer screening  Cytology - PAP, GC/CT  3. History of abnormal Pap smear    PID resolved   Plan:   F/U 12 weeks   Dorathy Kinsman 12/20/2011 4:58 PM

## 2011-12-20 NOTE — Patient Instructions (Signed)

## 2012-03-06 ENCOUNTER — Ambulatory Visit: Payer: Medicaid Other

## 2012-03-17 ENCOUNTER — Ambulatory Visit (INDEPENDENT_AMBULATORY_CARE_PROVIDER_SITE_OTHER): Payer: Medicaid Other | Admitting: *Deleted

## 2012-03-17 VITALS — BP 123/87 | HR 87 | Ht 64.0 in

## 2012-03-17 DIAGNOSIS — Z3049 Encounter for surveillance of other contraceptives: Secondary | ICD-10-CM

## 2012-03-17 MED ORDER — MEDROXYPROGESTERONE ACETATE 150 MG/ML IM SUSP
150.0000 mg | Freq: Once | INTRAMUSCULAR | Status: AC
  Administered 2012-03-17: 150 mg via INTRAMUSCULAR

## 2012-06-02 ENCOUNTER — Ambulatory Visit: Payer: Self-pay

## 2012-07-02 ENCOUNTER — Inpatient Hospital Stay (HOSPITAL_COMMUNITY)
Admission: AD | Admit: 2012-07-02 | Discharge: 2012-07-02 | Disposition: A | Discharge status: Home / Self Care | Payer: Self-pay | Source: Ambulatory Visit | Attending: Obstetrics and Gynecology | Admitting: Obstetrics and Gynecology

## 2012-07-02 ENCOUNTER — Encounter (HOSPITAL_COMMUNITY): Payer: Self-pay | Admitting: *Deleted

## 2012-07-02 DIAGNOSIS — N938 Other specified abnormal uterine and vaginal bleeding: Secondary | ICD-10-CM | POA: Insufficient documentation

## 2012-07-02 DIAGNOSIS — E860 Dehydration: Secondary | ICD-10-CM | POA: Insufficient documentation

## 2012-07-02 DIAGNOSIS — Z113 Encounter for screening for infections with a predominantly sexual mode of transmission: Secondary | ICD-10-CM

## 2012-07-02 DIAGNOSIS — R300 Dysuria: Secondary | ICD-10-CM | POA: Insufficient documentation

## 2012-07-02 DIAGNOSIS — N949 Unspecified condition associated with female genital organs and menstrual cycle: Secondary | ICD-10-CM | POA: Insufficient documentation

## 2012-07-02 LAB — URINALYSIS, ROUTINE W REFLEX MICROSCOPIC
Bilirubin Urine: NEGATIVE
Ketones, ur: NEGATIVE mg/dL
Leukocytes, UA: NEGATIVE
Nitrite: NEGATIVE
Protein, ur: NEGATIVE mg/dL
Urobilinogen, UA: 0.2 mg/dL (ref 0.0–1.0)

## 2012-07-02 LAB — URINE MICROSCOPIC-ADD ON

## 2012-07-02 LAB — WET PREP, GENITAL: Yeast Wet Prep HPF POC: NONE SEEN

## 2012-07-02 NOTE — MAU Provider Note (Signed)
History     CSN: [id number]  Arrival date and time: 07/02/12 1727   First Provider Initiated Contact with Patient 07/02/12 1812      Chief Complaint  Patient presents with  . Vaginal Discharge   HPI Samantha [name] 36 y.o.   Comes to MAU as she has had intermittent bleeding and wants to be checked for STDs.  Having some dysuria and wonders if she has a UTI.  OB History    Grav Para Term Preterm Abortions TAB SAB Ect Mult Living   2    1  1   1       Past Medical History  Diagnosis Date  . Back pain     Past Surgical History  Procedure Date  . Leep     Family History  Problem Relation Age of Onset  . Anesthesia problems Neg Hx   . Other Neg Hx     History  Substance Use Topics  . Smoking status: Current Every Day Smoker -- 1.0 packs/day    Types: Cigars  . Smokeless tobacco: Not on file  . Alcohol Use: No     occasionally    Allergies: No Known Allergies  Prescriptions prior to admission  Medication Sig Dispense Refill  . medroxyPROGESTERone (DEPO-PROVERA) 150 MG/ML injection Inject 150 mg into the muscle every 3 (three) months.        Review of Systems  Constitutional: Negative for fever.  Gastrointestinal: Negative for nausea, vomiting and abdominal pain.  Genitourinary: Positive for dysuria.       Vaginal spotting   Physical Exam   Blood pressure 132/90, pulse 87, temperature 99 F (37.2 C), temperature source Oral, resp. rate 16, height 5\' 4"  (1.626 m), weight 58.968 kg (130 lb).  Physical Exam  Nursing note and vitals reviewed. Constitutional: She is oriented to person, place, and time. She appears well-developed and well-nourished. No distress.  HENT:  Head: Normocephalic.  Eyes: EOM are normal.  Neck: Neck supple.  GI: Soft. There is no tenderness.  Genitourinary:       Speculum exam: Vulva  - clitoral piercing noted Vagina - Small amount of creamy discharge, no odor, minimal amount of old blood seen Cervix - No contact bleeding,  cervical os stenosed and would not admit swab  Bimanual exam: Cervix closed Uterus non tender, normal size Adnexa non tender, no masses bilaterally GC/Chlam, wet prep done Chaperone present for exam.  Musculoskeletal: Normal range of motion.  Neurological: She is alert and oriented to person, place, and time.  Skin: Skin is warm and dry.  Psychiatric: She has a normal mood and affect.    MAU Course  Procedures  MDM Results for orders placed during the hospital encounter of 07/02/12 (from the past 24 hour(s))  URINALYSIS, ROUTINE W REFLEX MICROSCOPIC     Status: Abnormal   Collection Time   07/02/12  5:45 PM      Component Value Range   Color, Urine YELLOW  YELLOW   APPearance CLEAR  CLEAR   Specific Gravity, Urine >1.030 (*) 1.005 - 1.030   pH 6.0  5.0 - 8.0   Glucose, UA NEGATIVE  NEGATIVE mg/dL   Hgb urine dipstick SMALL (*) NEGATIVE   Bilirubin Urine NEGATIVE  NEGATIVE   Ketones, ur NEGATIVE  NEGATIVE mg/dL   Protein, ur NEGATIVE  NEGATIVE mg/dL   Urobilinogen, UA 0.2  0.0 - 1.0 mg/dL   Nitrite NEGATIVE  NEGATIVE   Leukocytes, UA NEGATIVE  NEGATIVE  URINE MICROSCOPIC-ADD ON  Status: Abnormal   Collection Time   07/02/12  5:45 PM      Component Value Range   Squamous Epithelial / LPF FEW (*) RARE   RBC / HPF 3-6  <3 RBC/hpf   Bacteria, UA RARE  RARE   Urine-Other MUCOUS PRESENT    WET PREP, GENITAL     Status: Abnormal   Collection Time   07/02/12  6:20 PM      Component Value Range   Yeast Wet Prep HPF POC NONE SEEN  NONE SEEN   Trich, Wet Prep NONE SEEN  NONE SEEN   Clue Cells Wet Prep HPF POC FEW (*) NONE SEEN   WBC, Wet Prep HPF POC FEW (*) NONE SEEN      Assessment and Plan  No infection found Dehydration  Plan Drink at least 8 8-oz glasses of water every day. Cultures pending Condoms always for STD/HIV protection  Samantha [name] 07/02/2012, 6:13 PM

## 2012-07-02 NOTE — MAU Provider Note (Signed)
Attestation of Attending Supervision of Advanced Practitioner: Evaluation and management procedures were performed by the PA/NP/CNM/OB Fellow under my supervision/collaboration. Chart reviewed and agree with management and plan.  Tiberius Loftus V 07/02/2012 10:50 PM    

## 2012-07-02 NOTE — MAU Note (Signed)
Concerned about being passed a STD;

## 2012-10-16 ENCOUNTER — Encounter (HOSPITAL_COMMUNITY): Payer: Self-pay | Admitting: *Deleted

## 2012-10-16 ENCOUNTER — Inpatient Hospital Stay (HOSPITAL_COMMUNITY)
Admission: AD | Admit: 2012-10-16 | Discharge: 2012-10-16 | Disposition: A | Discharge status: Home / Self Care | Payer: Self-pay | Source: Ambulatory Visit | Attending: Obstetrics & Gynecology | Admitting: Obstetrics & Gynecology

## 2012-10-16 DIAGNOSIS — B9689 Other specified bacterial agents as the cause of diseases classified elsewhere: Secondary | ICD-10-CM | POA: Insufficient documentation

## 2012-10-16 DIAGNOSIS — M545 Low back pain, unspecified: Secondary | ICD-10-CM | POA: Insufficient documentation

## 2012-10-16 DIAGNOSIS — A499 Bacterial infection, unspecified: Secondary | ICD-10-CM | POA: Insufficient documentation

## 2012-10-16 DIAGNOSIS — N911 Secondary amenorrhea: Secondary | ICD-10-CM

## 2012-10-16 DIAGNOSIS — N912 Amenorrhea, unspecified: Secondary | ICD-10-CM | POA: Insufficient documentation

## 2012-10-16 DIAGNOSIS — N760 Acute vaginitis: Secondary | ICD-10-CM | POA: Insufficient documentation

## 2012-10-16 LAB — URINALYSIS, ROUTINE W REFLEX MICROSCOPIC
Bilirubin Urine: NEGATIVE
Ketones, ur: NEGATIVE mg/dL
Nitrite: NEGATIVE
Protein, ur: NEGATIVE mg/dL
pH: 6 (ref 5.0–8.0)

## 2012-10-16 LAB — URINE MICROSCOPIC-ADD ON

## 2012-10-16 LAB — POCT PREGNANCY, URINE: Preg Test, Ur: NEGATIVE

## 2012-10-16 LAB — WET PREP, GENITAL: WBC, Wet Prep HPF POC: NONE SEEN

## 2012-10-16 MED ORDER — METRONIDAZOLE 500 MG PO TABS: 500.0000 mg | ORAL_TABLET | Freq: Two times a day (BID) | ORAL | Status: DC

## 2012-10-16 MED ORDER — NIFEDIPINE 10 MG PO CAPS: 20.0000 mg | ORAL_CAPSULE | Freq: Once | ORAL | Status: DC

## 2012-10-16 MED ORDER — MEDROXYPROGESTERONE ACETATE 10 MG PO TABS: 10.0000 mg | ORAL_TABLET | Freq: Every day | ORAL | Status: DC

## 2012-10-16 NOTE — MAU Provider Note (Signed)
Chief Complaint: Possible Pregnancy and Flank Pain    HPI: Samantha [name] is a 37 y.o. G2P1011 who presents with amenorrhea since LMP 07/15/12. Also having low back pain. No increased stress or life change. Menses monthly and regular prior to that even when on Depo for 6 months (last taken last summer).   Pertinent ROS:No irritative vaginitis or new sexual partner. No hypothyroid sx except small amount of weight gain. No urinary sx.      Obstetrical Hx: SVD  Problem list, past medical history, Ob/Gyn history, surgical history, family history and social history reviewed and updated as appropriate.  No prescriptions prior to admission    No Known Allergies  Blood pressure 109/73, pulse 66, temperature 98.6 F (37 C), temperature source Oral, resp. rate 16, height 5' 4.5" (1.638 m), weight 143 lb 12.8 oz (65.227 kg), last menstrual period 07/15/2012, SpO2 100.00%.  Physical Exam: General: WN, WD female in NAD HEENT: Normocephalic Cor: RRR without murmur Resp: CTA bilaterally Abd: Soft, NT, minimally tender right lower paraspinous area Back: Negative CVAT Ext: No edema Speculum exam: NEFG, moderate gray-white vaginal discharge, no blood Bimanual: Uterus NSSP, no adnexal tenderness or masses blood    Lab Results: Results for orders placed during the hospital encounter of 10/16/12 (from the past 24 hour(s))  URINALYSIS, ROUTINE W REFLEX MICROSCOPIC     Status: Abnormal   Collection Time   10/16/12 12:25 PM      Component Value Range   Color, Urine YELLOW  YELLOW   APPearance CLEAR  CLEAR   Specific Gravity, Urine >1.030 (*) 1.005 - 1.030   pH 6.0  5.0 - 8.0   Glucose, UA NEGATIVE  NEGATIVE mg/dL   Hgb urine dipstick MODERATE (*) NEGATIVE   Bilirubin Urine NEGATIVE  NEGATIVE   Ketones, ur NEGATIVE  NEGATIVE mg/dL   Protein, ur NEGATIVE  NEGATIVE mg/dL   Urobilinogen, UA 0.2  0.0 - 1.0 mg/dL   Nitrite NEGATIVE  NEGATIVE   Leukocytes, UA NEGATIVE  NEGATIVE  URINE MICROSCOPIC-ADD  ON     Status: Abnormal   Collection Time   10/16/12 12:25 PM      Component Value Range   Squamous Epithelial / LPF FEW (*) RARE   WBC, UA 0-2  <3 WBC/hpf   RBC / HPF 3-6  <3 RBC/hpf   Bacteria, UA FEW (*) RARE   Urine-Other MUCOUS PRESENT    POCT PREGNANCY, URINE     Status: Normal   Collection Time   10/16/12 12:33 PM      Component Value Range   Preg Test, Ur NEGATIVE  NEGATIVE  WET PREP, GENITAL     Status: Abnormal   Collection Time   10/16/12  1:10 PM      Component Value Range   Yeast Wet Prep HPF POC NONE SEEN  NONE SEEN   Trich, Wet Prep NONE SEEN  NONE SEEN   Clue Cells Wet Prep HPF POC MANY (*) NONE SEEN   WBC, Wet Prep HPF POC NONE SEEN  NONE SEEN      Assessment 1. BV (bacterial vaginosis)   2. Amenorrhea, secondary     Plan Consult with Dr. Debroah Loop   Medication List     As of 10/16/2012  2:04 PM    TAKE these medications         medroxyPROGESTERone 10 MG tablet   Commonly known as: PROVERA   Take 1 tablet (10 mg total) by mouth daily.  Rx: Flagyl 500 mg po bid x 7d Keep menstrual calendar F/U [facility] Samantha [name], CNM

## 2012-10-16 NOTE — MAU Note (Signed)
Patient states she had not had a period since 11-5. Had a negative home pregnancy test last month. Has been having right flank pain for a couple of weeks. Denies any bleeding or discharge. Some occasional nausea, no vomiting.

## 2012-10-17 LAB — GC/CHLAMYDIA PROBE AMP: CT Probe RNA: NEGATIVE

## 2012-11-09 ENCOUNTER — Emergency Department (HOSPITAL_COMMUNITY)
Admission: EM | Admit: 2012-11-09 | Discharge: 2012-11-09 | Disposition: A | Discharge status: Home / Self Care | Payer: Self-pay | Attending: Emergency Medicine | Admitting: Emergency Medicine

## 2012-11-09 ENCOUNTER — Encounter (HOSPITAL_COMMUNITY): Payer: Self-pay | Admitting: Nurse Practitioner

## 2012-11-09 DIAGNOSIS — H6123 Impacted cerumen, bilateral: Secondary | ICD-10-CM

## 2012-11-09 DIAGNOSIS — F17200 Nicotine dependence, unspecified, uncomplicated: Secondary | ICD-10-CM | POA: Insufficient documentation

## 2012-11-09 DIAGNOSIS — Z8739 Personal history of other diseases of the musculoskeletal system and connective tissue: Secondary | ICD-10-CM | POA: Insufficient documentation

## 2012-11-09 DIAGNOSIS — H6120 Impacted cerumen, unspecified ear: Secondary | ICD-10-CM | POA: Insufficient documentation

## 2012-11-09 MED ORDER — DOCUSATE SODIUM 50 MG/5ML PO LIQD
50.0000 mg | Freq: Once | ORAL | Status: AC
  Administered 2012-11-09: 10 mg

## 2012-11-09 MED ORDER — DOCUSATE SODIUM 50 MG/5ML PO LIQD
50.0000 mg | Freq: Once | ORAL | Status: DC
  Administered 2012-11-09: 10 mg via ORAL
  Filled 2012-11-09: qty 10

## 2012-11-09 NOTE — ED Provider Notes (Signed)
History     CSN: [id number]  Arrival date & time 11/09/12  1048   First MD Initiated Contact with Patient 11/09/12 1109      Chief Complaint  Patient presents with  . Otalgia    (Consider location/radiation/quality/duration/timing/severity/associated sxs/prior treatment) HPI Comments: Patient presents today with a feeling that she has something stuck in her right ear.  She reports that she is not having any actual pain, but does have a feeling of fullness in her ear.  She also feels that the her hearing is decreased.  Symptoms have been present for a couple of weeks and are gradually worsening.  She has tried putting a q-tip in her ears to clean it out without relief.  She denies any fever or chills.  No prior history of ear infections.  No ear trauma.  The history is provided by the patient.    Past Medical History  Diagnosis Date  . Back pain     Past Surgical History  Procedure Laterality Date  . Leep      Family History  Problem Relation Age of Onset  . Anesthesia problems Neg Hx   . Other Neg Hx     History  Substance Use Topics  . Smoking status: Current Every Day Smoker -- 1.00 packs/day    Types: Cigars  . Smokeless tobacco: Not on file  . Alcohol Use: Yes     Comment: occasionally    OB History   Grav Para Term Preterm Abortions TAB SAB Ect Mult Living   2    1  1   1       Review of Systems  Constitutional: Negative for fever and chills.  HENT: Negative for congestion, rhinorrhea and ear discharge.        Ear fullness Decreased hearing    Allergies  Review of patient's allergies indicates no known allergies.  Home Medications   Current Outpatient Rx  Name  Route  Sig  Dispense  Refill  . acetaminophen (TYLENOL) 500 MG tablet   Oral   Take 500 mg by mouth every 6 (six) hours as needed for pain.           BP 119/78  Pulse 84  Temp(Src) 98.3 F (36.8 C) (Oral)  Resp 15  SpO2 97%  LMP 07/15/2012  Physical Exam  Nursing note and  vitals reviewed. Constitutional: She appears well-developed and well-nourished.  HENT:  Head: Normocephalic and atraumatic.  Right Ear: Tympanic membrane and external ear normal. No drainage or swelling. No mastoid tenderness.  Left Ear: Tympanic membrane normal. No drainage or swelling. No mastoid tenderness.  Mouth/Throat: Oropharynx is clear and moist.  Bilateral cerumen impaction  Cardiovascular: Normal rate, regular rhythm and normal heart sounds.   Pulmonary/Chest: Effort normal and breath sounds normal.  Neurological: She is alert.  Skin: Skin is warm and dry. No erythema.  Psychiatric: She has a normal mood and affect.    ED Course  Procedures (including critical care time)  Labs Reviewed - No data to display No results found.   No diagnosis found.    MDM  Patient with bilateral cerumen impaction.  Both ears irrigated while in the ED and symptoms improved.  No signs of ear infection.        Pascal Lux [place], PA-C 11/09/12 1701

## 2012-11-09 NOTE — ED Notes (Signed)
C/o r ear pain since yesterday "Feels like theres something stuck in it>" tried to clean it with a qtip with no relief

## 2012-11-11 NOTE — ED Provider Notes (Signed)
Medical screening examination/treatment/procedure(s) were performed by non-physician practitioner and as supervising physician I was immediately available for consultation/collaboration.   Laray Anger, DO 11/11/12 1315

## 2012-11-20 ENCOUNTER — Emergency Department (HOSPITAL_COMMUNITY): Payer: Self-pay

## 2012-11-20 ENCOUNTER — Emergency Department (HOSPITAL_COMMUNITY)
Admission: EM | Admit: 2012-11-20 | Discharge: 2012-11-20 | Disposition: A | Discharge status: Home / Self Care | Payer: Self-pay | Attending: Emergency Medicine | Admitting: Emergency Medicine

## 2012-11-20 ENCOUNTER — Encounter (HOSPITAL_COMMUNITY): Payer: Self-pay

## 2012-11-20 DIAGNOSIS — R112 Nausea with vomiting, unspecified: Secondary | ICD-10-CM | POA: Insufficient documentation

## 2012-11-20 DIAGNOSIS — K529 Noninfective gastroenteritis and colitis, unspecified: Secondary | ICD-10-CM

## 2012-11-20 DIAGNOSIS — Z8739 Personal history of other diseases of the musculoskeletal system and connective tissue: Secondary | ICD-10-CM | POA: Insufficient documentation

## 2012-11-20 DIAGNOSIS — Z3202 Encounter for pregnancy test, result negative: Secondary | ICD-10-CM | POA: Insufficient documentation

## 2012-11-20 DIAGNOSIS — R197 Diarrhea, unspecified: Secondary | ICD-10-CM | POA: Insufficient documentation

## 2012-11-20 DIAGNOSIS — K5289 Other specified noninfective gastroenteritis and colitis: Secondary | ICD-10-CM | POA: Insufficient documentation

## 2012-11-20 DIAGNOSIS — F17200 Nicotine dependence, unspecified, uncomplicated: Secondary | ICD-10-CM | POA: Insufficient documentation

## 2012-11-20 LAB — COMPREHENSIVE METABOLIC PANEL
ALT: <5 U/L (ref 0–35)
AST: 11 U/L (ref 0–37)
Alkaline Phosphatase: 79 U/L (ref 39–117)
CO2: 26 mEq/L (ref 19–32)
GFR calc Af Amer: >90 mL/min (ref >90)
Glucose, Bld: 101 mg/dL — ABNORMAL HIGH (ref 70–99)
Potassium: 3.3 mEq/L — ABNORMAL LOW (ref 3.5–5.1)
Sodium: 138 mEq/L (ref 135–145)
Total Protein: 8.6 g/dL — ABNORMAL HIGH (ref 6.0–8.3)

## 2012-11-20 LAB — CBC WITH DIFFERENTIAL/PLATELET
Basophils Absolute: 0 10*3/uL (ref 0.0–0.1)
Eosinophils Absolute: 0 10*3/uL (ref 0.0–0.7)
Lymphocytes Relative: 19 % (ref 12–46)
Lymphs Abs: 2 10*3/uL (ref 0.7–4.0)
Neutrophils Relative %: 72 % (ref 43–77)
Platelets: 234 10*3/uL (ref 150–400)
RBC: 4.71 MIL/uL (ref 3.87–5.11)
RDW: 12.5 % (ref 11.5–15.5)
WBC: 10.7 10*3/uL — ABNORMAL HIGH (ref 4.0–10.5)

## 2012-11-20 LAB — POCT PREGNANCY, URINE: Preg Test, Ur: NEGATIVE

## 2012-11-20 LAB — URINALYSIS, MICROSCOPIC ONLY
Glucose, UA: NEGATIVE mg/dL
Specific Gravity, Urine: 1.03 (ref 1.005–1.030)
Urobilinogen, UA: 0.2 mg/dL (ref 0.0–1.0)

## 2012-11-20 MED ORDER — DICYCLOMINE HCL 20 MG PO TABS: 20.0000 mg | ORAL_TABLET | Freq: Two times a day (BID) | ORAL | Status: DC

## 2012-11-20 MED ORDER — SODIUM CHLORIDE 0.9 % IV BOLUS (SEPSIS)
1000.0000 mL | Freq: Once | INTRAVENOUS | Status: AC
  Administered 2012-11-20: 1000 mL via INTRAVENOUS

## 2012-11-20 MED ORDER — PROMETHAZINE HCL 25 MG PO TABS: 25.0000 mg | ORAL_TABLET | Freq: Four times a day (QID) | ORAL | Status: DC | PRN

## 2012-11-20 MED ORDER — MORPHINE SULFATE 4 MG/ML IJ SOLN
4.0000 mg | Freq: Once | INTRAMUSCULAR | Status: AC
  Administered 2012-11-20: 4 mg via INTRAVENOUS
  Filled 2012-11-20: qty 1

## 2012-11-20 MED ORDER — ONDANSETRON HCL 4 MG/2ML IJ SOLN
4.0000 mg | Freq: Once | INTRAMUSCULAR | Status: AC
  Administered 2012-11-20: 4 mg via INTRAVENOUS
  Filled 2012-11-20: qty 2

## 2012-11-20 NOTE — ED Notes (Signed)
Patient transported to CT 

## 2012-11-20 NOTE — ED Provider Notes (Signed)
Medical screening examination/treatment/procedure(s) were performed by non-physician practitioner and as supervising physician I was immediately available for consultation/collaboration.  Marwan T Powers, MD 11/20/12 1639 

## 2012-11-20 NOTE — ED Notes (Addendum)
Patient C/O having abdominal pain since Sunday. States that her period began Saturday and has not stopped. Patient C/O right flank pain and diarrhea also.  States that her urine has become darker and also has some dysuria.  C/O pain when she defecates.  Abdomen is tender to palpation in all quadrants and in her right flank.

## 2012-11-20 NOTE — ED Provider Notes (Signed)
History     CSN: [id number]  Arrival date & time 11/20/12  0945   First MD Initiated Contact with Patient 11/20/12 1023      Chief Complaint  Patient presents with  . Abdominal Pain    (Consider location/radiation/quality/duration/timing/severity/associated sxs/prior treatment) HPI Comments: Patient is a 37 year old female with no significant past medical history who presents with a 4 day history of abdominal pain. The pain is located in her generalized abdomen and does not radiate. The pain is described as cramping and severe. The pain started gradually and progressively worsened since the onset. No alleviating/aggravating factors. The patient has tried ibuprofen and tylenol for symptoms without relief. Associated symptoms include severe right flank pain, NVD. Patient denies fever, headache, chest pain, SOB, dysuria, constipation, abnormal vaginal bleeding/discharge. Patient reports not having a period since November 2013 until 5 days ago when she started her period.      Past Medical History  Diagnosis Date  . Back pain     Past Surgical History  Procedure Laterality Date  . Leep      Family History  Problem Relation Age of Onset  . Anesthesia problems Neg Hx   . Other Neg Hx     History  Substance Use Topics  . Smoking status: Current Every Day Smoker -- 1.00 packs/day    Types: Cigars  . Smokeless tobacco: Not on file  . Alcohol Use: Yes     Comment: occasionally    OB History   Grav Para Term Preterm Abortions TAB SAB Ect Mult Living   2    1  1   1       Review of Systems  Gastrointestinal: Positive for nausea, vomiting, abdominal pain and diarrhea.  Genitourinary: Positive for flank pain.  All other systems reviewed and are negative.    Allergies  Review of patient's allergies indicates no known allergies.  Home Medications   Current Outpatient Rx  Name  Route  Sig  Dispense  Refill  . acetaminophen (TYLENOL) 500 MG tablet   Oral   Take 500 mg  by mouth every 6 (six) hours as needed for pain.           BP 104/77  Pulse 108  Temp(Src) 99.3 F (37.4 C) (Oral)  Resp 22  SpO2 99%  LMP 11/15/2012  Physical Exam  Nursing note and vitals reviewed. Constitutional: She is oriented to person, place, and time. She appears well-developed and well-nourished. No distress.  HENT:  Head: Normocephalic and atraumatic.  Eyes: Conjunctivae and EOM are normal. Pupils are equal, round, and reactive to light. No scleral icterus.  Neck: Normal range of motion.  Cardiovascular: Normal rate and regular rhythm.  Exam reveals no gallop and no friction rub.   No murmur heard. Pulmonary/Chest: Effort normal and breath sounds normal. She has no wheezes. She has no rales. She exhibits no tenderness.  Abdominal: Soft. She exhibits no distension. There is tenderness. There is no rebound and no guarding.  Generalized tenderness to palpation. No focal tenderness or peritoneal signs.   Genitourinary:  Right CVA tenderness.   Musculoskeletal: Normal range of motion.  Neurological: She is alert and oriented to person, place, and time.  Speech is goal-oriented. Moves limbs without ataxia.   Skin: Skin is warm and dry.  Psychiatric: She has a normal mood and affect. Her behavior is normal.    ED Course  Procedures (including critical care time)  Labs Reviewed  CBC WITH DIFFERENTIAL - Abnormal; Notable  for the following:    WBC 10.7 (*)    All other components within normal limits  COMPREHENSIVE METABOLIC PANEL - Abnormal; Notable for the following:    Potassium 3.3 (*)    Glucose, Bld 101 (*)    Total Protein 8.6 (*)    All other components within normal limits  URINALYSIS, MICROSCOPIC ONLY - Abnormal; Notable for the following:    APPearance CLOUDY (*)    Hgb urine dipstick LARGE (*)    Bilirubin Urine SMALL (*)    Ketones, ur 15 (*)    Protein, ur 30 (*)    Bacteria, UA FEW (*)    Squamous Epithelial / LPF MANY (*)    All other components  within normal limits  LIPASE, BLOOD  POCT PREGNANCY, URINE   Ct Abdomen Pelvis Wo Contrast  11/20/2012  *RADIOLOGY REPORT*  Clinical Data: Umbilical and right lower quadrant pain with nausea, vomiting and diarrhea.  CT ABDOMEN AND PELVIS WITHOUT CONTRAST  Technique:  Multidetector CT imaging of the abdomen and pelvis was performed following the standard protocol without intravenous contrast.  Comparison: None.  Findings: Lung bases show no acute findings.  Heart size normal. No pericardial or pleural effusion.  Liver, gallbladder, adrenal glands, kidneys, spleen, pancreas, stomach and bowel, including appendix, are unremarkable.  Probable small pelvic free fluid.  Uterus is visualized.  Ovaries are not well visualized.  No pathologically enlarged lymph nodes.  No worrisome lytic or sclerotic lesions.  IMPRESSION:  Suspect small pelvic free fluid.  No additional findings to explain the patient's given symptoms.  No evidence of acute appendicitis. No urinary stones or obstruction.   Original Report Authenticated By: Leanna Battles, M.D.      1. Gastroenteritis       MDM  10:30 AM Labs pending. Patient will have morphine, zofran, and fluids. Patient afebrile and tachycardic.   11:48 AM Labs unrmarkable. Hemoglobin in urine likely due to patient's menstrual cycle. I will order a CT abdomen pelvis to rule out kidney stone.   3:09 PM CT negative for stone. Patient likely has gastroenteritis. Vitals stable. Patient will be discharged with bentyl and phenergan. Patient instructed to return to the ED with worsening or concerning symptoms.       Emilia Beck, PA-C 11/20/12 1510

## 2012-11-20 NOTE — ED Notes (Addendum)
Pt. Reports symptoms began Sunday night, vomiting, diarrhea  , abdomina pain, severe pain on the RT. Side of abdomen,  Denies any problems voiding.  Pt. Is alert and oriented X4, Skin is p/w/d,  Pt. Reports has not had a period since November and began vaginal bleeding March 8th.  Pt. Reports not sexually active since November,  Pt. Is having chills.

## 2012-11-22 ENCOUNTER — Encounter (HOSPITAL_COMMUNITY): Payer: Self-pay | Admitting: Emergency Medicine

## 2012-11-22 ENCOUNTER — Emergency Department (HOSPITAL_COMMUNITY): Payer: Self-pay

## 2012-11-22 ENCOUNTER — Emergency Department (HOSPITAL_COMMUNITY)
Admission: EM | Admit: 2012-11-22 | Discharge: 2012-11-23 | Disposition: A | Discharge status: Home / Self Care | Payer: Self-pay | Attending: Emergency Medicine | Admitting: Emergency Medicine

## 2012-11-22 DIAGNOSIS — R112 Nausea with vomiting, unspecified: Secondary | ICD-10-CM | POA: Insufficient documentation

## 2012-11-22 DIAGNOSIS — R197 Diarrhea, unspecified: Secondary | ICD-10-CM | POA: Insufficient documentation

## 2012-11-22 DIAGNOSIS — Z3202 Encounter for pregnancy test, result negative: Secondary | ICD-10-CM | POA: Insufficient documentation

## 2012-11-22 DIAGNOSIS — F17200 Nicotine dependence, unspecified, uncomplicated: Secondary | ICD-10-CM | POA: Insufficient documentation

## 2012-11-22 DIAGNOSIS — N7093 Salpingitis and oophoritis, unspecified: Secondary | ICD-10-CM | POA: Insufficient documentation

## 2012-11-22 LAB — BASIC METABOLIC PANEL
BUN: 6 mg/dL (ref 6–23)
CO2: 24 mEq/L (ref 19–32)
Calcium: 9.8 mg/dL (ref 8.4–10.5)
Chloride: 98 mEq/L (ref 96–112)
Creatinine, Ser: 0.77 mg/dL (ref 0.50–1.10)
GFR calc Af Amer: >90 mL/min (ref >90)
GFR calc non Af Amer: >90 mL/min (ref >90)
Glucose, Bld: 137 mg/dL — ABNORMAL HIGH (ref 70–99)
Potassium: 3.1 mEq/L — ABNORMAL LOW (ref 3.5–5.1)
Sodium: 135 mEq/L (ref 135–145)

## 2012-11-22 LAB — URINALYSIS, ROUTINE W REFLEX MICROSCOPIC
Ketones, ur: 15 mg/dL — AB
Leukocytes, UA: NEGATIVE
Nitrite: NEGATIVE
Protein, ur: 100 mg/dL — AB
Urobilinogen, UA: 0.2 mg/dL (ref 0.0–1.0)
pH: 6 (ref 5.0–8.0)

## 2012-11-22 LAB — CBC WITH DIFFERENTIAL/PLATELET
Basophils Absolute: 0 10*3/uL (ref 0.0–0.1)
Eosinophils Relative: 0 % (ref 0–5)
HCT: 41.9 % (ref 36.0–46.0)
Lymphocytes Relative: 11 % — ABNORMAL LOW (ref 12–46)
Lymphs Abs: 1.8 10*3/uL (ref 0.7–4.0)
MCV: 89.5 fL (ref 78.0–100.0)
Monocytes Relative: 11 % (ref 3–12)
Neutro Abs: 12.4 10*3/uL — ABNORMAL HIGH (ref 1.7–7.7)
RBC: 4.68 MIL/uL (ref 3.87–5.11)
RDW: 12.5 % (ref 11.5–15.5)
WBC: 16 10*3/uL — ABNORMAL HIGH (ref 4.0–10.5)

## 2012-11-22 LAB — URINE MICROSCOPIC-ADD ON

## 2012-11-22 LAB — PREGNANCY, URINE: Preg Test, Ur: NEGATIVE

## 2012-11-22 LAB — WET PREP, GENITAL
Trich, Wet Prep: NONE SEEN
Yeast Wet Prep HPF POC: NONE SEEN

## 2012-11-22 MED ORDER — MORPHINE SULFATE 4 MG/ML IJ SOLN
4.0000 mg | Freq: Once | INTRAMUSCULAR | Status: AC
  Administered 2012-11-22: 4 mg via INTRAVENOUS
  Filled 2012-11-22: qty 1

## 2012-11-22 MED ORDER — PROMETHAZINE HCL 25 MG/ML IJ SOLN
12.5000 mg | Freq: Once | INTRAMUSCULAR | Status: AC
  Administered 2012-11-22: 12.5 mg via INTRAVENOUS
  Filled 2012-11-22: qty 1

## 2012-11-22 MED ORDER — ACETAMINOPHEN 325 MG PO TABS
650.0000 mg | ORAL_TABLET | Freq: Once | ORAL | Status: AC
  Administered 2012-11-22: 650 mg via ORAL
  Filled 2012-11-22: qty 2

## 2012-11-22 MED ORDER — DEXTROSE 5 % IV SOLN
1.0000 g | Freq: Once | INTRAVENOUS | Status: AC
  Administered 2012-11-22: 23:00:00 via INTRAVENOUS
  Filled 2012-11-22: qty 10

## 2012-11-22 MED ORDER — SODIUM CHLORIDE 0.9 % IV BOLUS (SEPSIS)
1000.0000 mL | Freq: Once | INTRAVENOUS | Status: AC
  Administered 2012-11-22: 1000 mL via INTRAVENOUS

## 2012-11-22 MED ORDER — ONDANSETRON HCL 4 MG/2ML IJ SOLN
4.0000 mg | Freq: Once | INTRAMUSCULAR | Status: AC
  Administered 2012-11-22: 4 mg via INTRAVENOUS
  Filled 2012-11-22: qty 2

## 2012-11-22 MED ORDER — POTASSIUM CHLORIDE 10 MEQ/100ML IV SOLN
10.0000 meq | Freq: Once | INTRAVENOUS | Status: AC
  Administered 2012-11-22: 10 meq via INTRAVENOUS
  Filled 2012-11-22: qty 100

## 2012-11-22 NOTE — ED Notes (Addendum)
Pt c/o continuing symptoms of abdominal pain with n/v and fever. Pt reports unable to tolerate PO food or fluids. Prescribed medications ineffective. Pt last took prescribed meds at 1000.

## 2012-11-22 NOTE — ED Provider Notes (Signed)
  Medical screening examination/treatment/procedure(s) were conducted as a shared visit with non-physician practitioner(s) and myself.  I personally evaluated the patient during the encounter  Pt with fever, abd pain and n/v.  She has had RLQ pain on my exam and on PA exam with some evidence for adnexal abnormality - has leukocytosis and on [place] has possible TOA with bilateral complex adnexal structures.  [facility] has d/w Gyn who accepted in transfer.  Vida Roller, MD 11/23/12 0000

## 2012-11-22 NOTE — ED Provider Notes (Signed)
History     CSN: [id number]  Arrival date & time 11/22/12  1414   First MD Initiated Contact with Patient 11/22/12 1604      Chief Complaint  Patient presents with  . Abdominal Pain  . Nausea  . Emesis    (Consider location/radiation/quality/duration/timing/severity/associated sxs/prior treatment) Patient is a 37 y.o. female presenting with abdominal pain and vomiting. The history is provided by the patient. No language interpreter was used.  Abdominal Pain Pain location:  RUQ and RLQ Pain quality: sharp   Pain radiates to:  R flank Pain severity:  Moderate Progression:  Worsening Associated symptoms: vomiting   Associated symptoms comment:  Abdominal pain, nausea, vomiting, diarrhea for 6 days. She reports a fever also with Tmax 101. She was seen and evaluated 11/20/12 in this department and discharged home with Phenergan which has not controlled her symptoms. She reports lightheadedness when she stands with diaphoresis, then continued vomiting. Her abdominal pain was generalized initially but is not right upper and right lower quadrant with radiation into the right flank. No dysuria, vaginal discharge, bloody bowel movements or hematemesis. Emesis Associated symptoms: abdominal pain     Past Medical History  Diagnosis Date  . Back pain     Past Surgical History  Procedure Laterality Date  . Leep      Family History  Problem Relation Age of Onset  . Anesthesia problems Neg Hx   . Other Neg Hx     History  Substance Use Topics  . Smoking status: Current Every Day Smoker -- 1.00 packs/day    Types: Cigars  . Smokeless tobacco: Not on file  . Alcohol Use: Yes     Comment: occasionally    OB History   Grav Para Term Preterm Abortions TAB SAB Ect Mult Living   2    1  1   1       Review of Systems  Gastrointestinal: Positive for vomiting and abdominal pain.    Allergies  Review of patient's allergies indicates no known allergies.  Home Medications    Current Outpatient Rx  Name  Route  Sig  Dispense  Refill  . acetaminophen (TYLENOL) 500 MG tablet   Oral   Take 500 mg by mouth every 6 (six) hours as needed for pain.         Marland Kitchen dicyclomine (BENTYL) 20 MG tablet   Oral   Take 1 tablet (20 mg total) by mouth 2 (two) times daily.   20 tablet   0   . promethazine (PHENERGAN) 25 MG tablet   Oral   Take 1 tablet (25 mg total) by mouth every 6 (six) hours as needed for nausea.   12 tablet   0     BP 125/69  Pulse 118  Temp(Src) 99.4 F (37.4 C) (Oral)  Resp 18  SpO2 97%  LMP 11/15/2012  Physical Exam  ED Course  Procedures (including critical care time)  Labs Reviewed  URINE CULTURE  URINALYSIS, ROUTINE W REFLEX MICROSCOPIC  CBC WITH DIFFERENTIAL  BASIC METABOLIC PANEL  PREGNANCY, URINE   Results for orders placed during the hospital encounter of 11/22/12  WET PREP, GENITAL      Result Value Range   Yeast Wet Prep HPF POC NONE SEEN  NONE SEEN   Trich, Wet Prep NONE SEEN  NONE SEEN   Clue Cells Wet Prep HPF POC MANY (*) NONE SEEN   WBC, Wet Prep HPF POC FEW (*) NONE SEEN  URINALYSIS, ROUTINE W  REFLEX MICROSCOPIC      Result Value Range   Color, Urine AMBER (*) YELLOW   APPearance CLOUDY (*) CLEAR   Specific Gravity, Urine 1.027  1.005 - 1.030   pH 6.0  5.0 - 8.0   Glucose, UA NEGATIVE  NEGATIVE mg/dL   Hgb urine dipstick MODERATE (*) NEGATIVE   Bilirubin Urine SMALL (*) NEGATIVE   Ketones, ur 15 (*) NEGATIVE mg/dL   Protein, ur 295 (*) NEGATIVE mg/dL   Urobilinogen, UA 0.2  0.0 - 1.0 mg/dL   Nitrite NEGATIVE  NEGATIVE   Leukocytes, UA NEGATIVE  NEGATIVE  CBC WITH DIFFERENTIAL      Result Value Range   WBC 16.0 (*) 4.0 - 10.5 K/uL   RBC 4.68  3.87 - 5.11 MIL/uL   Hemoglobin 14.3  12.0 - 15.0 g/dL   HCT 62.1  30.8 - 65.7 %   MCV 89.5  78.0 - 100.0 fL   MCH 30.6  26.0 - 34.0 pg   MCHC 34.1  30.0 - 36.0 g/dL   RDW 84.6  96.2 - 95.2 %   Platelets 245  150 - 400 K/uL   Neutrophils Relative 78 (*) 43 -  77 %   Lymphocytes Relative 11 (*) 12 - 46 %   Monocytes Relative 11  3 - 12 %   Eosinophils Relative 0  0 - 5 %   Basophils Relative 0  0 - 1 %   Neutro Abs 12.4 (*) 1.7 - 7.7 K/uL   Lymphs Abs 1.8  0.7 - 4.0 K/uL   Monocytes Absolute 1.8 (*) 0.1 - 1.0 K/uL   Eosinophils Absolute 0.0  0.0 - 0.7 K/uL   Basophils Absolute 0.0  0.0 - 0.1 K/uL   WBC Morphology ATYPICAL LYMPHOCYTES    BASIC METABOLIC PANEL      Result Value Range   Sodium 135  135 - 145 mEq/L   Potassium 3.1 (*) 3.5 - 5.1 mEq/L   Chloride 98  96 - 112 mEq/L   CO2 24  19 - 32 mEq/L   Glucose, Bld 137 (*) 70 - 99 mg/dL   BUN 6  6 - 23 mg/dL   Creatinine, Ser 8.41  0.50 - 1.10 mg/dL   Calcium 9.8  8.4 - 32.4 mg/dL   GFR calc non Af Amer >90  >90 mL/min   GFR calc Af Amer >90  >90 mL/min  PREGNANCY, URINE      Result Value Range   Preg Test, Ur NEGATIVE  NEGATIVE  URINE MICROSCOPIC-ADD ON      Result Value Range   Squamous Epithelial / LPF FEW (*) RARE   RBC / HPF 3-6  <3 RBC/hpf   Urine-Other MUCOUS PRESENT     Ct Abdomen Pelvis Wo Contrast  11/20/2012  *RADIOLOGY REPORT*  Clinical Data: Umbilical and right lower quadrant pain with nausea, vomiting and diarrhea.  CT ABDOMEN AND PELVIS WITHOUT CONTRAST  Technique:  Multidetector CT imaging of the abdomen and pelvis was performed following the standard protocol without intravenous contrast.  Comparison: None.  Findings: Lung bases show no acute findings.  Heart size normal. No pericardial or pleural effusion.  Liver, gallbladder, adrenal glands, kidneys, spleen, pancreas, stomach and bowel, including appendix, are unremarkable.  Probable small pelvic free fluid.  Uterus is visualized.  Ovaries are not well visualized.  No pathologically enlarged lymph nodes.  No worrisome lytic or sclerotic lesions.  IMPRESSION:  Suspect small pelvic free fluid.  No additional findings to explain the  patient's given symptoms.  No evidence of acute appendicitis. No urinary stones or  obstruction.   Original Report Authenticated By: Leanna Battles, M.D.    US Transvaginal Non-ob  11/22/2012  *RADIOLOGY REPORT*  Clinical Data: Abdominal pain.  Nausea, emesis.  Right lower quadrant pain.  Prior LEEP procedure.  LMP 11/15/2012.  TRANSABDOMINAL AND TRANSVAGINAL ULTRASOUND OF PELVIS Technique:  Both transabdominal and transvaginal ultrasound examinations of the pelvis were performed. Transabdominal technique was performed for global imaging of the pelvis including uterus, ovaries, adnexal regions, and pelvic cul-de-sac.  It was necessary to proceed with endovaginal exam following the transabdominal exam to visualize the uterus, endometrium, ovaries, adnexal regions.  Comparison:  CT of the abdomen and pelvis 11/20/2012  Findings:  Uterus: Retroflexed.  8.1 x 4.2 x 5.3 cm.  Endometrium: Small amountof lower uterine segment fluid.  10 mm in thickness.  Right ovary:  8.1 x 7.2 x 5.3 cm complex cystic /tubular adnexal mass  Left ovary: 6.4 x 3.8 x 4.5 cm complex cystic mass.  Other findings: Trace free pelvic fluid identified.  IMPRESSION:  1.  Retroverted uterus with small amount of lower uterine segment fluid. 2.  Complex bilateral adnexal masses.  On the right, the appearance favors tubal ovarian abscess.  Neoplasms are a consideration bilaterally.  Correlation with other clinical data is recommended. Follow-up is recommended to document resolution.   Original Report Authenticated By: Norva Pavlov, M.D.    US Pelvis Complete  11/22/2012  *RADIOLOGY REPORT*  Clinical Data: Abdominal pain.  Nausea, emesis.  Right lower quadrant pain.  Prior LEEP procedure.  LMP 11/15/2012.  TRANSABDOMINAL AND TRANSVAGINAL ULTRASOUND OF PELVIS Technique:  Both transabdominal and transvaginal ultrasound examinations of the pelvis were performed. Transabdominal technique was performed for global imaging of the pelvis including uterus, ovaries, adnexal regions, and pelvic cul-de-sac.  It was necessary to proceed  with endovaginal exam following the transabdominal exam to visualize the uterus, endometrium, ovaries, adnexal regions.  Comparison:  CT of the abdomen and pelvis 11/20/2012  Findings:  Uterus: Retroflexed.  8.1 x 4.2 x 5.3 cm.  Endometrium: Small amountof lower uterine segment fluid.  10 mm in thickness.  Right ovary:  8.1 x 7.2 x 5.3 cm complex cystic /tubular adnexal mass  Left ovary: 6.4 x 3.8 x 4.5 cm complex cystic mass.  Other findings: Trace free pelvic fluid identified.  IMPRESSION:  1.  Retroverted uterus with small amount of lower uterine segment fluid. 2.  Complex bilateral adnexal masses.  On the right, the appearance favors tubal ovarian abscess.  Neoplasms are a consideration bilaterally.  Correlation with other clinical data is recommended. Follow-up is recommended to document resolution.   Original Report Authenticated By: Norva Pavlov, M.D.    No results found.   No diagnosis found.  1. TOA, right 2. Febrile illness 3. Pelvic pain   MDM  Pain and nausea difficult to manage in ED - multiple IV doses given. Pelvic exam done to evaluate abdominal pain given recent normal CT and no other source of symptoms. Bilateral adnexal masses, right favored to be TOA, which clinically correlates with presentation. Discussed with Dr. Shawnie Pons at [facility] who accepts patient for transfer and admission.        Arnoldo Hooker, PA-C 11/22/12 2211

## 2012-11-23 ENCOUNTER — Inpatient Hospital Stay (HOSPITAL_COMMUNITY)
Admission: AD | Admit: 2012-11-23 | Discharge: 2012-11-29 | DRG: 759 | Disposition: A | Discharge status: Home / Self Care | Payer: Self-pay | Source: Ambulatory Visit | Attending: Family Medicine | Admitting: Family Medicine

## 2012-11-23 ENCOUNTER — Encounter (HOSPITAL_COMMUNITY): Payer: Self-pay

## 2012-11-23 DIAGNOSIS — A088 Other specified intestinal infections: Secondary | ICD-10-CM | POA: Diagnosis present

## 2012-11-23 DIAGNOSIS — R109 Unspecified abdominal pain: Secondary | ICD-10-CM | POA: Diagnosis present

## 2012-11-23 DIAGNOSIS — N7093 Salpingitis and oophoritis, unspecified: Principal | ICD-10-CM | POA: Diagnosis present

## 2012-11-23 LAB — CBC WITH DIFFERENTIAL/PLATELET
Band Neutrophils: 0 % (ref 0–10)
Basophils Absolute: 0 10*3/uL (ref 0.0–0.1)
Basophils Relative: 0 % (ref 0–1)
Eosinophils Absolute: 0 10*3/uL (ref 0.0–0.7)
Eosinophils Relative: 0 % (ref 0–5)
HCT: 37.2 % (ref 36.0–46.0)
Hemoglobin: 12.5 g/dL (ref 12.0–15.0)
Lymphs Abs: 5.4 10*3/uL — ABNORMAL HIGH (ref 0.7–4.0)
MCH: 30.7 pg (ref 26.0–34.0)
MCHC: 33.6 g/dL (ref 30.0–36.0)
Myelocytes: 0 %
Neutro Abs: 12.2 10*3/uL — ABNORMAL HIGH (ref 1.7–7.7)
Neutrophils Relative %: 66 % (ref 43–77)
Promyelocytes Absolute: 0 %
RBC: 4.07 MIL/uL (ref 3.87–5.11)

## 2012-11-23 LAB — INFLUENZA PANEL BY PCR (TYPE A & B)
H1N1 flu by pcr: NOT DETECTED
Influenza A By PCR: NEGATIVE

## 2012-11-23 MED ORDER — IBUPROFEN 600 MG PO TABS
600.0000 mg | ORAL_TABLET | Freq: Four times a day (QID) | ORAL | Status: DC
  Administered 2012-11-23 – 2012-11-29 (×18): 600 mg via ORAL
  Filled 2012-11-23 (×19): qty 1

## 2012-11-23 MED ORDER — ZOLPIDEM TARTRATE 5 MG PO TABS: 5.0000 mg | ORAL_TABLET | Freq: Every evening | ORAL | Status: DC | PRN

## 2012-11-23 MED ORDER — DOCUSATE SODIUM 100 MG PO CAPS
100.0000 mg | ORAL_CAPSULE | Freq: Two times a day (BID) | ORAL | Status: DC
  Administered 2012-11-23 – 2012-11-29 (×6): 100 mg via ORAL
  Filled 2012-11-23 (×10): qty 1

## 2012-11-23 MED ORDER — ACETAMINOPHEN 500 MG PO TABS
1000.0000 mg | ORAL_TABLET | Freq: Once | ORAL | Status: AC
  Administered 2012-11-23: 1000 mg via ORAL
  Filled 2012-11-23: qty 1

## 2012-11-23 MED ORDER — ONDANSETRON HCL 4 MG/2ML IJ SOLN
4.0000 mg | Freq: Once | INTRAMUSCULAR | Status: AC
  Administered 2012-11-23: 4 mg via INTRAVENOUS
  Filled 2012-11-23: qty 2

## 2012-11-23 MED ORDER — PIPERACILLIN-TAZOBACTAM 3.375 G IVPB
3.3750 g | Freq: Four times a day (QID) | INTRAVENOUS | Status: DC
  Administered 2012-11-23 – 2012-11-29 (×26): 3.375 g via INTRAVENOUS
  Filled 2012-11-23 (×29): qty 50

## 2012-11-23 MED ORDER — ONDANSETRON HCL 4 MG/2ML IJ SOLN
4.0000 mg | Freq: Four times a day (QID) | INTRAMUSCULAR | Status: DC | PRN
  Administered 2012-11-24 – 2012-11-25 (×4): 4 mg via INTRAVENOUS
  Filled 2012-11-23 (×5): qty 2

## 2012-11-23 MED ORDER — OXYCODONE-ACETAMINOPHEN 5-325 MG PO TABS
1.0000 | ORAL_TABLET | ORAL | Status: DC | PRN
  Administered 2012-11-23: 2 via ORAL
  Administered 2012-11-26: 1 via ORAL
  Filled 2012-11-23: qty 1
  Filled 2012-11-23 (×2): qty 2

## 2012-11-23 MED ORDER — DIPHENHYDRAMINE HCL 50 MG/ML IJ SOLN
12.5000 mg | Freq: Four times a day (QID) | INTRAMUSCULAR | Status: DC | PRN
  Administered 2012-11-27 – 2012-11-28 (×3): 12.5 mg via INTRAVENOUS
  Filled 2012-11-23 (×3): qty 1

## 2012-11-23 MED ORDER — ONDANSETRON HCL 4 MG/2ML IJ SOLN
4.0000 mg | Freq: Four times a day (QID) | INTRAMUSCULAR | Status: DC | PRN
  Administered 2012-11-23 (×2): 4 mg via INTRAVENOUS
  Filled 2012-11-23: qty 2

## 2012-11-23 MED ORDER — INFLUENZA VIRUS VACC SPLIT PF IM SUSP: 0.2500 mL | INTRAMUSCULAR | Status: DC

## 2012-11-23 MED ORDER — PNEUMOCOCCAL VAC POLYVALENT 25 MCG/0.5ML IJ INJ
0.5000 mL | INJECTION | INTRAMUSCULAR | Status: DC
  Filled 2012-11-23: qty 0.5

## 2012-11-23 MED ORDER — NALOXONE HCL 0.4 MG/ML IJ SOLN: 0.4000 mg | INTRAMUSCULAR | Status: DC | PRN

## 2012-11-23 MED ORDER — ONDANSETRON HCL 4 MG PO TABS: 4.0000 mg | ORAL_TABLET | Freq: Four times a day (QID) | ORAL | Status: DC | PRN

## 2012-11-23 MED ORDER — PRENATAL MULTIVITAMIN CH
1.0000 | ORAL_TABLET | Freq: Every day | ORAL | Status: DC
  Administered 2012-11-26: 1 via ORAL
  Filled 2012-11-23 (×3): qty 1

## 2012-11-23 MED ORDER — PANTOPRAZOLE SODIUM 40 MG PO TBEC
40.0000 mg | DELAYED_RELEASE_TABLET | Freq: Every day | ORAL | Status: DC
  Administered 2012-11-23 – 2012-11-29 (×5): 40 mg via ORAL
  Filled 2012-11-23 (×8): qty 1

## 2012-11-23 MED ORDER — HYDROMORPHONE HCL PF 1 MG/ML IJ SOLN: 1.0000 mg | INTRAMUSCULAR | Status: DC | PRN

## 2012-11-23 MED ORDER — ALUM & MAG HYDROXIDE-SIMETH 200-200-20 MG/5ML PO SUSP
30.0000 mL | ORAL | Status: DC | PRN
  Administered 2012-11-28: 30 mL via ORAL
  Filled 2012-11-23: qty 30

## 2012-11-23 MED ORDER — HYDROMORPHONE HCL PF 1 MG/ML IJ SOLN
1.0000 mg | Freq: Once | INTRAMUSCULAR | Status: AC
  Administered 2012-11-23: 1 mg via INTRAVENOUS
  Filled 2012-11-23: qty 1

## 2012-11-23 MED ORDER — DIPHENHYDRAMINE HCL 12.5 MG/5ML PO ELIX
12.5000 mg | ORAL_SOLUTION | Freq: Four times a day (QID) | ORAL | Status: DC | PRN
  Filled 2012-11-23: qty 5

## 2012-11-23 MED ORDER — HYDROMORPHONE 0.3 MG/ML IV SOLN
INTRAVENOUS | Status: DC
  Administered 2012-11-23: 0.9 mg via INTRAVENOUS
  Administered 2012-11-23: 0.3 mg via INTRAVENOUS
  Administered 2012-11-23: 02:00:00 via INTRAVENOUS
  Filled 2012-11-23: qty 25

## 2012-11-23 MED ORDER — LACTATED RINGERS IV SOLN
INTRAVENOUS | Status: DC
  Administered 2012-11-23 – 2012-11-27 (×14): via INTRAVENOUS

## 2012-11-23 MED ORDER — SODIUM CHLORIDE 0.9 % IJ SOLN: 9.0000 mL | INTRAMUSCULAR | Status: DC | PRN

## 2012-11-23 NOTE — Progress Notes (Signed)
Subjective: Patient reports tolerating PO and no problems voiding.  Pt ate regular diet for breakfast.  She reports that her pain began 1 week prev.  She was seen in the ED March 13th with c/o N/V and pelvic pain. Was given anti-nausea meds.  She reports that the pain become progressively worse and she presented again in yesterday.  Was transferred overnight for eval of presumed bilateral TOA.    Pt reports a h/o STI's in the past. Denies h/o TOA's.  She  Denies sexual activity since November of 2013.    She reports improvement of pain and nausea since admission.    Objective: I have reviewed patient's vital signs, intake and output, medications, labs and radiology results. BP 102/63  Pulse 92  Temp(Src) 98.7 F (37.1 C) (Oral)  Resp 23  SpO2 99%  LMP 11/15/2012 Tmax 101.6 @ ~0500 General: alert and mild distress Resp: clear to auscultation bilaterally Cardio: regular rate and rhythm, S1, S2 normal, no murmur, click, rub or gallop GI: abnormal findings:  generalized tenderness in lower quadrants bilaterally.  No rebound or guarding.  CBC    Component Value Date/Time   WBC 18.5* 11/23/2012 0530   RBC 4.07 11/23/2012 0530   HGB 12.5 11/23/2012 0530   HCT 37.2 11/23/2012 0530   PLT 234 11/23/2012 0530   MCV 91.4 11/23/2012 0530   MCH 30.7 11/23/2012 0530   MCHC 33.6 11/23/2012 0530   RDW 12.8 11/23/2012 0530   LYMPHSABS 5.4* 11/23/2012 0530   MONOABS 0.9 11/23/2012 0530   EOSABS 0.0 11/23/2012 0530   BASOSABS 0.0 11/23/2012 0530   3/13/2014CT ABDOMEN AND PELVIS WITHOUT CONTRAST  Technique: Multidetector CT imaging of the abdomen and pelvis was  performed following the standard protocol without intravenous  contrast.  Comparison: None.  Findings: Lung bases show no acute findings. Heart size normal.  No pericardial or pleural effusion.  Liver, gallbladder, adrenal glands, kidneys, spleen, pancreas,  stomach and bowel, including appendix, are unremarkable. Probable  small pelvic free  fluid. Uterus is visualized. Ovaries are not  well visualized. No pathologically enlarged lymph nodes. No  worrisome lytic or sclerotic lesions.  IMPRESSION:  Suspect small pelvic free fluid. No additional findings to explain  the patient's given symptoms. No evidence of acute appendicitis.  No urinary stones or obstruction.  [id number] TRANSABDOMINAL AND TRANSVAGINAL ULTRASOUND OF PELVIS  Technique: Both transabdominal and transvaginal ultrasound  examinations of the pelvis were performed. Transabdominal technique  was performed for global imaging of the pelvis including uterus,  ovaries, adnexal regions, and pelvic cul-de-sac.  It was necessary to proceed with endovaginal exam following the  transabdominal exam to visualize the uterus, endometrium, ovaries,  adnexal regions.  Comparison: CT of the abdomen and pelvis 11/20/2012  Findings:  Uterus: Retroflexed. 8.1 x 4.2 x 5.3 cm.  Endometrium: Small amountof lower uterine segment fluid. 10 mm in  thickness.  Right ovary: 8.1 x 7.2 x 5.3 cm complex cystic /tubular adnexal  mass  Left ovary: 6.4 x 3.8 x 4.5 cm complex cystic mass.  Other findings: Trace free pelvic fluid identified.  IMPRESSION:  1. Retroverted uterus with small amount of lower uterine segment  fluid.  2. Complex bilateral adnexal masses. On the right, the appearance  favors tubal ovarian abscess. Neoplasms are a consideration  bilaterally. Correlation with other clinical data is recommended.  Follow-up is recommended to document resolution.    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  Will cont atbx for now and follow Keep Zosyn CBC in am Consider percutaneous drainage with IR if doe not improve in 48 hours or if she doe not continue to improve      LOS: 0 days    HARRAWAY-SMITH, Chasitee Zenker 11/23/2012, 10:50 AM

## 2012-11-23 NOTE — MAU Note (Signed)
Patient presents via [facility] from [facility] with onset of right lower abdominal pain 6 days ago, bilateral TOA

## 2012-11-23 NOTE — MAU Provider Note (Signed)
History     CSN: [id number]  Arrival date and time: 11/23/12 0024   None     Chief Complaint  Patient presents with  . Abdominal Pain   HPI Samantha [name] is a 37 y.o. female who presents to MAU [facility] from [facility] ED with abdominal pain. The patient had a complete evaluation including labs, CT scan and exam. Patient transferred to MAU with Dx of bilateral TOA. The patient continues to be uncomfortable despite IV pain medication and antiemetics. She rates her pain as 9/10. The pain is located in the lower abdomen with radiation to the lower back. She describes the pain as sharp and constant.  LMP 11/15/12. Unsure of last pap smear but has been a long time ago. Only child is 32 years old. Was using Depo Provera for birthcontrol but last injection was last year. No birth control now. Last sexual intercourse November 2013 with partner of one year. Hx of trichomonas, gonorrhea and Chlamydia.  Associated symptoms include nausea, vomiting and diarrhea and low grade fever.  The history was provided by the patient and her medical record.  OB History   Grav Para Term Preterm Abortions TAB SAB Ect Mult Living   2    1  1   1       Past Medical History  Diagnosis Date  . Back pain   . Medical history non-contributory     Past Surgical History  Procedure Laterality Date  . Leep      Family History  Problem Relation Age of Onset  . Anesthesia problems Neg Hx   . Other Neg Hx     History  Substance Use Topics  . Smoking status: Current Every Day Smoker -- 1.00 packs/day    Types: Cigars  . Smokeless tobacco: Not on file  . Alcohol Use: Yes     Comment: occasionally    Allergies: No Known Allergies  Prescriptions prior to admission  Medication Sig Dispense Refill  . acetaminophen (TYLENOL) 500 MG tablet Take 500 mg by mouth every 6 (six) hours as needed for pain.      Marland Kitchen dicyclomine (BENTYL) 20 MG tablet Take 1 tablet (20 mg total) by mouth 2 (two) times daily.  20 tablet  0   . promethazine (PHENERGAN) 25 MG tablet Take 1 tablet (25 mg total) by mouth every 6 (six) hours as needed for nausea.  12 tablet  0    Review of Systems  Constitutional: Positive for fever, chills and weight loss.  HENT: Negative for ear pain, nosebleeds, congestion, sore throat and neck pain.   Eyes: Negative for blurred vision, double vision, photophobia and pain.  Respiratory: Positive for cough. Negative for shortness of breath and wheezing.   Cardiovascular: Negative for chest pain, palpitations and leg swelling.  Gastrointestinal: Positive for nausea, vomiting, abdominal pain and diarrhea. Negative for heartburn and constipation.  Genitourinary: Positive for dysuria and frequency. Negative for urgency.  Musculoskeletal: Positive for back pain. Negative for myalgias.  Skin: Negative for itching and rash.  Neurological: Positive for dizziness and headaches. Negative for sensory change, speech change and seizures.  Endo/Heme/Allergies: Does not bruise/bleed easily.  Psychiatric/Behavioral: Negative for depression and substance abuse. The patient is not nervous/anxious.    Physical Exam   Blood pressure 117/73, pulse 89, temperature 100.5 F (38.1 C), temperature source Oral, resp. rate 18, last menstrual period 11/15/2012.  Physical Exam  Nursing note and vitals reviewed. Constitutional: She is oriented to person, place, and time.  She appears well-developed and well-nourished. No distress.  HENT:  Head: Normocephalic and atraumatic.  Eyes: EOM are normal.  Neck: Neck supple.  Cardiovascular: Normal rate and regular rhythm.   Respiratory: Effort normal and breath sounds normal.  GI: Soft. There is tenderness in the right lower quadrant, suprapubic area and left lower quadrant. There is no rebound, no guarding and no CVA tenderness.  Genitourinary: Vagina normal.  Musculoskeletal: Normal range of motion.  Neurological: She is alert and oriented to person, place, and time.   Skin: Skin is warm and dry.  Psychiatric: She has a normal mood and affect. Her behavior is normal. Judgment and thought content normal.   Results for orders placed during the hospital encounter of 11/22/12 (from the past 24 hour(s))  CBC WITH DIFFERENTIAL     Status: Abnormal   Collection Time    11/22/12  4:32 PM      Result Value Range   WBC 16.0 (*) 4.0 - 10.5 K/uL   RBC 4.68  3.87 - 5.11 MIL/uL   Hemoglobin 14.3  12.0 - 15.0 g/dL   HCT 16.1  09.6 - 04.5 %   MCV 89.5  78.0 - 100.0 fL   MCH 30.6  26.0 - 34.0 pg   MCHC 34.1  30.0 - 36.0 g/dL   RDW 40.9  81.1 - 91.4 %   Platelets 245  150 - 400 K/uL   Neutrophils Relative 78 (*) 43 - 77 %   Lymphocytes Relative 11 (*) 12 - 46 %   Monocytes Relative 11  3 - 12 %   Eosinophils Relative 0  0 - 5 %   Basophils Relative 0  0 - 1 %   Neutro Abs 12.4 (*) 1.7 - 7.7 K/uL   Lymphs Abs 1.8  0.7 - 4.0 K/uL   Monocytes Absolute 1.8 (*) 0.1 - 1.0 K/uL   Eosinophils Absolute 0.0  0.0 - 0.7 K/uL   Basophils Absolute 0.0  0.0 - 0.1 K/uL   WBC Morphology ATYPICAL LYMPHOCYTES    BASIC METABOLIC PANEL     Status: Abnormal   Collection Time    11/22/12  4:32 PM      Result Value Range   Sodium 135  135 - 145 mEq/L   Potassium 3.1 (*) 3.5 - 5.1 mEq/L   Chloride 98  96 - 112 mEq/L   CO2 24  19 - 32 mEq/L   Glucose, Bld 137 (*) 70 - 99 mg/dL   BUN 6  6 - 23 mg/dL   Creatinine, Ser 7.82  0.50 - 1.10 mg/dL   Calcium 9.8  8.4 - 95.6 mg/dL   GFR calc non Af Amer >90  >90 mL/min   GFR calc Af Amer >90  >90 mL/min  URINALYSIS, ROUTINE W REFLEX MICROSCOPIC     Status: Abnormal   Collection Time    11/22/12  6:22 PM      Result Value Range   Color, Urine AMBER (*) YELLOW   APPearance CLOUDY (*) CLEAR   Specific Gravity, Urine 1.027  1.005 - 1.030   pH 6.0  5.0 - 8.0   Glucose, UA NEGATIVE  NEGATIVE mg/dL   Hgb urine dipstick MODERATE (*) NEGATIVE   Bilirubin Urine SMALL (*) NEGATIVE   Ketones, ur 15 (*) NEGATIVE mg/dL   Protein, ur 213 (*)  NEGATIVE mg/dL   Urobilinogen, UA 0.2  0.0 - 1.0 mg/dL   Nitrite NEGATIVE  NEGATIVE   Leukocytes, UA NEGATIVE  NEGATIVE  PREGNANCY,  URINE     Status: None   Collection Time    11/22/12  6:22 PM      Result Value Range   Preg Test, Ur NEGATIVE  NEGATIVE  URINE MICROSCOPIC-ADD ON     Status: Abnormal   Collection Time    11/22/12  6:22 PM      Result Value Range   Squamous Epithelial / LPF FEW (*) RARE   RBC / HPF 3-6  <3 RBC/hpf   Urine-Other MUCOUS PRESENT    WET PREP, GENITAL     Status: Abnormal   Collection Time    11/22/12  7:38 PM      Result Value Range   Yeast Wet Prep HPF POC NONE SEEN  NONE SEEN   Trich, Wet Prep NONE SEEN  NONE SEEN   Clue Cells Wet Prep HPF POC MANY (*) NONE SEEN   WBC, Wet Prep HPF POC FEW (*) NONE SEEN    US Transvaginal Non-ob  11/22/2012  *RADIOLOGY REPORT*  Clinical Data: Abdominal pain.  Nausea, emesis.  Right lower quadrant pain.  Prior LEEP procedure.  LMP 11/15/2012.  TRANSABDOMINAL AND TRANSVAGINAL ULTRASOUND OF PELVIS Technique:  Both transabdominal and transvaginal ultrasound examinations of the pelvis were performed. Transabdominal technique was performed for global imaging of the pelvis including uterus, ovaries, adnexal regions, and pelvic cul-de-sac.  It was necessary to proceed with endovaginal exam following the transabdominal exam to visualize the uterus, endometrium, ovaries, adnexal regions.  Comparison:  CT of the abdomen and pelvis 11/20/2012  Findings:  Uterus: Retroflexed.  8.1 x 4.2 x 5.3 cm.  Endometrium: Small amountof lower uterine segment fluid.  10 mm in thickness.  Right ovary:  8.1 x 7.2 x 5.3 cm complex cystic /tubular adnexal mass  Left ovary: 6.4 x 3.8 x 4.5 cm complex cystic mass.  Other findings: Trace free pelvic fluid identified.  IMPRESSION:  1.  Retroverted uterus with small amount of lower uterine segment fluid. 2.  Complex bilateral adnexal masses.  On the right, the appearance favors tubal ovarian abscess.  Neoplasms  are a consideration bilaterally.  Correlation with other clinical data is recommended. Follow-up is recommended to document resolution.   Original Report Authenticated By: Norva Pavlov, M.D.    US Pelvis Complete  11/22/2012  *RADIOLOGY REPORT*  Clinical Data: Abdominal pain.  Nausea, emesis.  Right lower quadrant pain.  Prior LEEP procedure.  LMP 11/15/2012.  TRANSABDOMINAL AND TRANSVAGINAL ULTRASOUND OF PELVIS Technique:  Both transabdominal and transvaginal ultrasound examinations of the pelvis were performed. Transabdominal technique was performed for global imaging of the pelvis including uterus, ovaries, adnexal regions, and pelvic cul-de-sac.  It was necessary to proceed with endovaginal exam following the transabdominal exam to visualize the uterus, endometrium, ovaries, adnexal regions.  Comparison:  CT of the abdomen and pelvis 11/20/2012  Findings:  Uterus: Retroflexed.  8.1 x 4.2 x 5.3 cm.  Endometrium: Small amountof lower uterine segment fluid.  10 mm in thickness.  Right ovary:  8.1 x 7.2 x 5.3 cm complex cystic /tubular adnexal mass  Left ovary: 6.4 x 3.8 x 4.5 cm complex cystic mass.  Other findings: Trace free pelvic fluid identified.  IMPRESSION:  1.  Retroverted uterus with small amount of lower uterine segment fluid. 2.  Complex bilateral adnexal masses.  On the right, the appearance favors tubal ovarian abscess.  Neoplasms are a consideration bilaterally.  Correlation with other clinical data is recommended. Follow-up is recommended to document resolution.   Original Report Authenticated By:  Norva Pavlov, M.D.     MAU Course: Dr. Shawnie Pons to MAU to evaluate.  Procedures  Assessment: 37 y.o. female with bilateral TOA  Plan:  Admit for IV antibiotics   Admission orders written by me.   Aprile Dickenson, RN, FNP, [facility] 11/23/2012, 12:32 AM

## 2012-11-23 NOTE — H&P (Signed)
Chief Complaint   Patient presents with   .  Abdominal Pain    HPI: Samantha [name] is a 37 y.o. female who presents to [facility] from [facility] ED with abdominal pain. The patient had a complete evaluation including labs, CT scan and exam. Patient transferred to MAU with Dx of bilateral TOA. The patient continues to be uncomfortable despite IV pain medication and antiemetics. She rates her pain as 9/10. The pain is located in the lower abdomen with radiation to the lower back. She describes the pain as sharp and constant. LMP 11/15/12. Unsure of last pap smear but has been a long time ago. Only child is 49 years old. Was using Depo Provera for birthcontrol but last injection was last year. No birth control now. Last sexual intercourse November 2013 with partner of one year. Hx of trichomonas, gonorrhea and Chlamydia. Associated symptoms include nausea, vomiting and diarrhea and low grade fever. The history was provided by the patient and her medical record.   OB History    Grav  Para  Term  Preterm  Abortions  TAB  SAB  Ect  Mult  Living    2     1   1    1       Past Medical History   Diagnosis  Date   .  Back pain    .  Medical history non-contributory     Past Surgical History   Procedure  Laterality  Date   .  Leep      Family History   Problem  Relation  Age of Onset   .  Anesthesia problems  Neg Hx    .  Other  Neg Hx     History   Substance Use Topics   .  Smoking status:  Current Every Day Smoker -- 1.00 packs/day     Types:  Cigars   .  Smokeless tobacco:  Not on file   .  Alcohol Use:  Yes      Comment: occasionally    Allergies: No Known Allergies   Prescriptions prior to admission   Medication  Sig  Dispense  Refill   .  acetaminophen (TYLENOL) 500 MG tablet  Take 500 mg by mouth every 6 (six) hours as needed for pain.     Marland Kitchen  dicyclomine (BENTYL) 20 MG tablet  Take 1 tablet (20 mg total) by mouth 2 (two) times daily.  20 tablet  0   .  promethazine (PHENERGAN)  25 MG tablet  Take 1 tablet (25 mg total) by mouth every 6 (six) hours as needed for nausea.  12 tablet  0    Review of Systems  Constitutional: Positive for fever, chills and weight loss.  HENT: Negative for ear pain, nosebleeds, congestion, sore throat and neck pain.  Eyes: Negative for blurred vision, double vision, photophobia and pain.  Respiratory: Positive for cough. Negative for shortness of breath and wheezing.  Cardiovascular: Negative for chest pain, palpitations and leg swelling.  Gastrointestinal: Positive for nausea, vomiting, abdominal pain and diarrhea. Negative for heartburn and constipation.  Genitourinary: Positive for dysuria and frequency. Negative for urgency.  Musculoskeletal: Positive for back pain. Negative for myalgias.  Skin: Negative for itching and rash.  Neurological: Positive for dizziness and headaches. Negative for sensory change, speech change and seizures.  Endo/Heme/Allergies: Does not bruise/bleed easily.  Psychiatric/Behavioral: Negative for depression and substance abuse. The patient is not nervous/anxious.   Physical Exam   Blood  pressure 117/73, pulse 89, temperature 100.5 F (38.1 C), temperature source Oral, resp. rate 18, last menstrual period 11/15/2012.  Physical Exam  Nursing note and vitals reviewed.  Constitutional: She is oriented to person, place, and time. She appears well-developed and well-nourished. No distress.  HENT:  Head: Normocephalic and atraumatic.  Eyes: EOM are normal.  Neck: Neck supple.  Cardiovascular: Normal rate and regular rhythm.  Respiratory: Effort normal and breath sounds normal.  GI: Soft. There is tenderness in the right lower quadrant, suprapubic area and left lower quadrant. There is no rebound, no guarding and no CVA tenderness.  Genitourinary: Vagina normal.  Musculoskeletal: Normal range of motion.  Neurological: She is alert and oriented to person, place, and time.  Skin: Skin is warm and dry.   Psychiatric: She has a normal mood and affect. Her behavior is normal. Judgment and thought content normal.    CBC WITH DIFFERENTIAL Status: Abnormal    Collection Time    11/22/12 4:32 PM   Result  Value  Range    WBC  16.0 (*)  4.0 - 10.5 K/uL    RBC  4.68  3.87 - 5.11 MIL/uL    Hemoglobin  14.3  12.0 - 15.0 g/dL    HCT  16.1  09.6 - 04.5 %    MCV  89.5  78.0 - 100.0 fL    MCH  30.6  26.0 - 34.0 pg    MCHC  34.1  30.0 - 36.0 g/dL    RDW  40.9  81.1 - 91.4 %    Platelets  245  150 - 400 K/uL    Neutrophils Relative  78 (*)  43 - 77 %    Lymphocytes Relative  11 (*)  12 - 46 %    Monocytes Relative  11  3 - 12 %    Eosinophils Relative  0  0 - 5 %    Basophils Relative  0  0 - 1 %    Neutro Abs  12.4 (*)  1.7 - 7.7 K/uL    Lymphs Abs  1.8  0.7 - 4.0 K/uL    Monocytes Absolute  1.8 (*)  0.1 - 1.0 K/uL    Eosinophils Absolute  0.0  0.0 - 0.7 K/uL    Basophils Absolute  0.0  0.0 - 0.1 K/uL    WBC Morphology  ATYPICAL LYMPHOCYTES    BASIC METABOLIC PANEL Status: Abnormal    Collection Time    11/22/12 4:32 PM   Result  Value  Range    Sodium  135  135 - 145 mEq/L    Potassium  3.1 (*)  3.5 - 5.1 mEq/L    Chloride  98  96 - 112 mEq/L    CO2  24  19 - 32 mEq/L    Glucose, Bld  137 (*)  70 - 99 mg/dL    BUN  6  6 - 23 mg/dL    Creatinine, Ser  7.82  0.50 - 1.10 mg/dL    Calcium  9.8  8.4 - 10.5 mg/dL    GFR calc non Af Amer  >90  >90 mL/min    GFR calc Af Amer  >90  >90 mL/min   URINALYSIS, ROUTINE W REFLEX MICROSCOPIC Status: Abnormal    Collection Time    11/22/12 6:22 PM   Result  Value  Range    Color, Urine  AMBER (*)  YELLOW    APPearance  CLOUDY (*)  CLEAR  Specific Gravity, Urine  1.027  1.005 - 1.030    pH  6.0  5.0 - 8.0    Glucose, UA  NEGATIVE  NEGATIVE mg/dL    Hgb urine dipstick  MODERATE (*)  NEGATIVE    Bilirubin Urine  SMALL (*)  NEGATIVE    Ketones, ur  15 (*)  NEGATIVE mg/dL    Protein, ur  409 (*)  NEGATIVE mg/dL    Urobilinogen, UA  0.2  0.0 -  1.0 mg/dL    Nitrite  NEGATIVE  NEGATIVE    Leukocytes, UA  NEGATIVE  NEGATIVE   PREGNANCY, URINE Status: None    Collection Time    11/22/12 6:22 PM   Result  Value  Range    Preg Test, Ur  NEGATIVE  NEGATIVE   URINE MICROSCOPIC-ADD ON Status: Abnormal    Collection Time    11/22/12 6:22 PM   Result  Value  Range    Squamous Epithelial / LPF  FEW (*)  RARE    RBC / HPF  3-6  <3 RBC/hpf    Urine-Other  MUCOUS PRESENT    WET PREP, GENITAL Status: Abnormal    Collection Time    11/22/12 7:38 PM   Result  Value  Range    Yeast Wet Prep HPF POC  NONE SEEN  NONE SEEN    Trich, Wet Prep  NONE SEEN  NONE SEEN    Clue Cells Wet Prep HPF POC  MANY (*)  NONE SEEN    WBC, Wet Prep HPF POC  FEW (*)  NONE SEEN   US Transvaginal Non-ob  11/22/2012 *RADIOLOGY REPORT* Clinical Data: Abdominal pain. Nausea, emesis. Right lower quadrant pain. Prior LEEP procedure. LMP 11/15/2012. TRANSABDOMINAL AND TRANSVAGINAL ULTRASOUND OF PELVIS Technique: Both transabdominal and transvaginal ultrasound examinations of the pelvis were performed. Transabdominal technique was performed for global imaging of the pelvis including uterus, ovaries, adnexal regions, and pelvic cul-de-sac. It was necessary to proceed with endovaginal exam following the transabdominal exam to visualize the uterus, endometrium, ovaries, adnexal regions. Comparison: CT of the abdomen and pelvis 11/20/2012 Findings: Uterus: Retroflexed. 8.1 x 4.2 x 5.3 cm. Endometrium: Small amountof lower uterine segment fluid. 10 mm in thickness. Right ovary: 8.1 x 7.2 x 5.3 cm complex cystic /tubular adnexal mass Left ovary: 6.4 x 3.8 x 4.5 cm complex cystic mass. Other findings: Trace free pelvic fluid identified. IMPRESSION: 1. Retroverted uterus with small amount of lower uterine segment fluid. 2. Complex bilateral adnexal masses. On the right, the appearance favors tubal ovarian abscess. Neoplasms are a consideration bilaterally. Correlation with other clinical  data is recommended. Follow-up is recommended to document resolution. Original Report Authenticated By: Norva Pavlov, M.D.  US Pelvis Complete  11/22/2012 *RADIOLOGY REPORT* Clinical Data: Abdominal pain. Nausea, emesis. Right lower quadrant pain. Prior LEEP procedure. LMP 11/15/2012. TRANSABDOMINAL AND TRANSVAGINAL ULTRASOUND OF PELVIS Technique: Both transabdominal and transvaginal ultrasound examinations of the pelvis were performed. Transabdominal technique was performed for global imaging of the pelvis including uterus, ovaries, adnexal regions, and pelvic cul-de-sac. It was necessary to proceed with endovaginal exam following the transabdominal exam to visualize the uterus, endometrium, ovaries, adnexal regions. Comparison: CT of the abdomen and pelvis 11/20/2012 Findings: Uterus: Retroflexed. 8.1 x 4.2 x 5.3 cm. Endometrium: Small amountof lower uterine segment fluid. 10 mm in thickness. Right ovary: 8.1 x 7.2 x 5.3 cm complex cystic /tubular adnexal mass Left ovary: 6.4 x 3.8 x 4.5 cm complex cystic mass. Other findings: Trace free pelvic fluid  identified. IMPRESSION: 1. Retroverted uterus with small amount of lower uterine segment fluid. 2. Complex bilateral adnexal masses. On the right, the appearance favors tubal ovarian abscess. Neoplasms are a consideration bilaterally. Correlation with other clinical data is recommended. Follow-up is recommended to document resolution. Original Report Authenticated By: Norva Pavlov, M.D.   Assessment: Patient Active Problem List  Diagnosis  . Viral gastroenteritis  . History of abnormal Pap smear  . History of PID  . TOA (tubo-ovarian abscess)   Plan: Admit IVF IV Abx Anti-emetics

## 2012-11-24 ENCOUNTER — Inpatient Hospital Stay (HOSPITAL_COMMUNITY): Payer: Self-pay

## 2012-11-24 DIAGNOSIS — N7003 Acute salpingitis and oophoritis: Secondary | ICD-10-CM

## 2012-11-24 LAB — URINE CULTURE

## 2012-11-24 LAB — BASIC METABOLIC PANEL
Calcium: 8.5 mg/dL (ref 8.4–10.5)
Creatinine, Ser: 0.7 mg/dL (ref 0.50–1.10)
GFR calc Af Amer: >90 mL/min (ref >90)
GFR calc non Af Amer: >90 mL/min (ref >90)
Sodium: 139 mEq/L (ref 135–145)

## 2012-11-24 LAB — CBC
Platelets: 261 10*3/uL (ref 150–400)
RBC: 3.95 MIL/uL (ref 3.87–5.11)
RDW: 12.9 % (ref 11.5–15.5)
WBC: 22.3 10*3/uL — ABNORMAL HIGH (ref 4.0–10.5)

## 2012-11-24 LAB — PROTIME-INR
INR: 1.19 (ref 0.00–1.49)
Prothrombin Time: 14.9 seconds (ref 11.6–15.2)

## 2012-11-24 LAB — GC/CHLAMYDIA PROBE AMP: CT Probe RNA: NEGATIVE

## 2012-11-24 MED ORDER — INFLUENZA VIRUS VACC SPLIT PF IM SUSP: 0.2500 mL | INTRAMUSCULAR | Status: AC

## 2012-11-24 MED ORDER — PNEUMOCOCCAL VAC POLYVALENT 25 MCG/0.5ML IJ INJ
0.5000 mL | INJECTION | INTRAMUSCULAR | Status: AC
  Filled 2012-11-24: qty 0.5

## 2012-11-24 MED ORDER — HYDROMORPHONE HCL PF 1 MG/ML IJ SOLN
1.0000 mg | INTRAMUSCULAR | Status: DC | PRN
  Administered 2012-11-24 – 2012-11-25 (×3): 1 mg via INTRAVENOUS
  Filled 2012-11-24 (×3): qty 1

## 2012-11-24 MED ORDER — IOHEXOL 300 MG/ML  SOLN
100.0000 mL | Freq: Once | INTRAMUSCULAR | Status: AC | PRN
  Administered 2012-11-24: 100 mL via INTRAVENOUS

## 2012-11-24 NOTE — Progress Notes (Signed)
UR completed 

## 2012-11-24 NOTE — Progress Notes (Signed)
Subjective: Patient reports nausea and vomiting.  Pt c/o nausea not responsive to meds at present.  Not able to hold down po pain meds,  Reports that her pain is improved slightly.   Objective: I have reviewed patient's vital signs, intake and output, medications, labs, microbiology and radiology results.BP 122/78  Pulse 80  Temp(Src) 97.9 F (36.6 C) (Oral)  Resp 16  SpO2 100%  LMP 11/15/2012 Pt vomiting in a bag when I entered the room  General: alert and moderate distress Resp: clear to auscultation bilaterally Cardio: regular rate and rhythm, S1, S2 normal, no murmur, click, rub or gallop GI: normal findings: bowel sounds normal and abnormal findings:  diffusely tender.  Improved from initial exam Extremities: extremities normal, atraumatic, no cyanosis or edema CBC    Component Value Date/Time   WBC 22.3* 11/24/2012 0530   RBC 3.95 11/24/2012 0530   HGB 12.0 11/24/2012 0530   HCT 36.2 11/24/2012 0530   PLT 261 11/24/2012 0530   MCV 91.6 11/24/2012 0530   MCH 30.4 11/24/2012 0530   MCHC 33.1 11/24/2012 0530   RDW 12.9 11/24/2012 0530   LYMPHSABS 5.4* 11/23/2012 0530   MONOABS 0.9 11/23/2012 0530   EOSABS 0.0 11/23/2012 0530   BASOSABS 0.0 11/23/2012 0530      Assessment/Plan: Pt with bilateral TOA's.  Continued n/v/  Pain slightly improved.  WBC increasing.  Will proceed with CT guided perc drainage.   Keep Zosyn NPO Coags/BMP  now   LOS: 1 day    Samantha [name] 11/24/2012, 7:41 AM

## 2012-11-24 NOTE — Progress Notes (Signed)
Aware of request for IR to perc drain pelvic fluid collection. Dr. Grace Isaac has reviewed Pelvic CT and feels right sided fluid collection is amenable to perc drainage via transgluteal approach. Pt is to be NPO p MN. Have tentatively on schedule for approx 100p tomorrow, pt will need to be here by approx 1200 to be seen and consented.

## 2012-11-25 ENCOUNTER — Other Ambulatory Visit (HOSPITAL_COMMUNITY): Payer: Self-pay

## 2012-11-25 ENCOUNTER — Encounter (HOSPITAL_COMMUNITY): Payer: Self-pay

## 2012-11-25 ENCOUNTER — Ambulatory Visit (HOSPITAL_COMMUNITY)
Admit: 2012-11-25 | Discharge: 2012-11-25 | Disposition: A | Discharge status: Home / Self Care | Payer: Self-pay | Attending: Obstetrics & Gynecology | Admitting: Obstetrics & Gynecology

## 2012-11-25 MED ORDER — FENTANYL CITRATE 0.05 MG/ML IJ SOLN
INTRAMUSCULAR | Status: AC | PRN
  Administered 2012-11-25 (×2): 50 ug via INTRAVENOUS
  Administered 2012-11-25: 100 ug via INTRAVENOUS
  Administered 2012-11-25: 50 ug via INTRAVENOUS

## 2012-11-25 MED ORDER — MIDAZOLAM HCL 2 MG/2ML IJ SOLN
INTRAMUSCULAR | Status: AC | PRN
  Administered 2012-11-25 (×2): 1 mg via INTRAVENOUS
  Administered 2012-11-25: 2 mg via INTRAVENOUS
  Administered 2012-11-25: 1 mg via INTRAVENOUS

## 2012-11-25 NOTE — ED Notes (Signed)
[facility] dispatch called and made aware that [name] is ready for transport back to [facility]. [place] with Maisie Fus.

## 2012-11-25 NOTE — Progress Notes (Signed)
Subjective: Patient reports nausea and vomiting and continued pain.  She is scheduled to undergo IR drainage of the TOA today, has been NPO since last night.    Objective: I have reviewed patient's vital signs, intake and output, medications, labs, microbiology and radiology results.BP 128/77  Pulse 64  Temp(Src) 98.3 F (36.8 C) (Oral)  Resp 18  SpO2 100%  LMP 11/15/2012  General: alert and moderate distress Resp: clear to auscultation bilaterally Cardio: regular rate and rhythm, S1, S2 normal, no murmur, click, rub or gallop GI: normal findings: bowel sounds normal and abnormal findings:  diffusely tender.  Improved from initial exam Extremities: extremities normal, atraumatic, no cyanosis or edema  Results for orders placed during the hospital encounter of 11/23/12 (from the past 48 hour(s))  CBC     Status: Abnormal   Collection Time    11/24/12  5:30 AM      Result Value Range   WBC 22.3 (*) 4.0 - 10.5 K/uL   RBC 3.95  3.87 - 5.11 MIL/uL   Hemoglobin 12.0  12.0 - 15.0 g/dL   HCT 16.1  09.6 - 04.5 %   MCV 91.6  78.0 - 100.0 fL   MCH 30.4  26.0 - 34.0 pg   MCHC 33.1  30.0 - 36.0 g/dL   RDW 40.9  81.1 - 91.4 %   Platelets 261  150 - 400 K/uL  APTT     Status: None   Collection Time    11/24/12  8:15 AM      Result Value Range   aPTT 34  24 - 37 seconds  PROTIME-INR     Status: None   Collection Time    11/24/12  8:15 AM      Result Value Range   Prothrombin Time 14.9  11.6 - 15.2 seconds   INR 1.19  0.00 - 1.49  BASIC METABOLIC PANEL     Status: Abnormal   Collection Time    11/24/12  8:15 AM      Result Value Range   Sodium 139  135 - 145 mEq/L   Potassium 3.1 (*) 3.5 - 5.1 mEq/L   Chloride 101  96 - 112 mEq/L   CO2 29  19 - 32 mEq/L   Glucose, Bld 97  70 - 99 mg/dL   BUN 3 (*) 6 - 23 mg/dL   Creatinine, Ser 7.82  0.50 - 1.10 mg/dL   Calcium 8.5  8.4 - 95.6 mg/dL   GFR calc non Af Amer >90  >90 mL/min   GFR calc Af Amer >90  >90 mL/min   Comment:           The eGFR has been calculated     using the CKD EPI equation.     This calculation has not been     validated in all clinical     situations.     eGFR's persistently     <90 mL/min signify     possible Chronic Kidney Disease.    Assessment/Plan: Pt with bilateral TOA's.  Continued pain with leukocytosis.  Will proceed with CT guided perc drainage today.   Continue Zosyn.  Will continue routine GYN care after IR drainage.   LOS: 2 days    Shey Bartmess A 11/25/2012, 12:04 PM

## 2012-11-25 NOTE — H&P (Signed)
Clarinda Obi is an 37 y.o. female.   Chief Complaint: right pelvic pain HPI: Patient with history of PID, right pelvic pain , leukocytosis and finding of bilateral hydro/pyosalpinx on recent CT presents today for CT guided aspiration /possible drainage of a right pelvic/?tubo-ovarian abscess.  Past Medical History  Diagnosis Date  . Back pain   . Medical history non-contributory     Past Surgical History  Procedure Laterality Date  . Leep      Family History  Problem Relation Age of Onset  . Anesthesia problems Neg Hx   . Other Neg Hx    Social History:  reports that she has been smoking Cigars.  She does not have any smokeless tobacco history on file. She reports that  drinks alcohol. She reports that she does not use illicit drugs.  Allergies: No Known Allergies  No current facility-administered medications for this encounter. No current outpatient prescriptions on file. Facility-Administered Medications Ordered in Other Encounters: alum & mag hydroxide-simeth (MAALOX/MYLANTA) 200-200-20 MG/5ML suspension 30 mL, 30 mL, Oral, Q4H PRN, Janne Napoleon, NP;  diphenhydrAMINE (BENADRYL) 12.5 MG/5ML elixir 12.5 mg, 12.5 mg, Oral, Q6H PRN, Janne Napoleon, NP;  diphenhydrAMINE (BENADRYL) injection 12.5 mg, 12.5 mg, Intravenous, Q6H PRN, Janne Napoleon, NP docusate sodium (COLACE) capsule 100 mg, 100 mg, Oral, BID, Hope M Neese, NP, 100 mg at 11/24/12 2146;  HYDROmorphone (DILAUDID) injection 1 mg, 1 mg, Intravenous, Q2H PRN, Willodean Rosenthal, MD, 1 mg at 11/24/12 1048;  ibuprofen (ADVIL,MOTRIN) tablet 600 mg, 600 mg, Oral, QID, Willodean Rosenthal, MD, 600 mg at 11/25/12 0541 influenza  inactive virus vaccine (FLUZONE/FLUARIX) injection 0.25 mL, 0.25 mL, Intramuscular, Tomorrow-1000, Reva Bores, MD;  lactated ringers infusion, , Intravenous, Continuous, Janne Napoleon, NP, Last Rate: 125 mL/hr at 11/25/12 0831;  naloxone [facility]) injection 0.4 mg, 0.4 mg, Intravenous, PRN, Janne Napoleon, NP;   ondansetron [facility]) injection 4 mg, 4 mg, Intravenous, Q6H PRN, Janne Napoleon, NP, 4 mg at 11/24/12 1520 ondansetron (ZOFRAN) injection 4 mg, 4 mg, Intravenous, Q6H PRN, Janne Napoleon, NP, 4 mg at 11/23/12 1647;  ondansetron (ZOFRAN) tablet 4 mg, 4 mg, Oral, Q6H PRN, Janne Napoleon, NP;  oxyCODONE-acetaminophen (PERCOCET/ROXICET) 5-325 MG per tablet 1-2 tablet, 1-2 tablet, Oral, Q4H PRN, Willodean Rosenthal, MD, 2 tablet at 11/23/12 1801;  pantoprazole (PROTONIX) EC tablet 40 mg, 40 mg, Oral, Daily, Willodean Rosenthal, MD, 40 mg at 11/23/12 1239 piperacillin-tazobactam (ZOSYN) IVPB 3.375 g, 3.375 g, Intravenous, Q6H, Hope M Neese, NP, 3.375 g at 11/25/12 1610;  pneumococcal 23 valent vaccine (PNU-IMMUNE) injection 0.5 mL, 0.5 mL, Intramuscular, Tomorrow-1000, Reva Bores, MD;  prenatal multivitamin tablet 1 tablet, 1 tablet, Oral, Q1200, Hope M Neese, NP;  sodium chloride 0.9 % injection 9 mL, 9 mL, Intravenous, PRN, Janne Napoleon, NP zolpidem (AMBIEN) tablet 5 mg, 5 mg, Oral, QHS PRN, Janne Napoleon, NP   Results for orders placed during the hospital encounter of 11/23/12 (from the past 48 hour(s))  CBC     Status: Abnormal   Collection Time    11/24/12  5:30 AM      Result Value Range   WBC 22.3 (*) 4.0 - 10.5 K/uL   RBC 3.95  3.87 - 5.11 MIL/uL   Hemoglobin 12.0  12.0 - 15.0 g/dL   HCT 96.0  45.4 - 09.8 %   MCV 91.6  78.0 - 100.0 fL   MCH 30.4  26.0 - 34.0 pg   MCHC 33.1  30.0 - 36.0 g/dL   RDW 11.9  14.7 - 82.9 %   Platelets 261  150 - 400 K/uL  APTT     Status: None   Collection Time    11/24/12  8:15 AM      Result Value Range   aPTT 34  24 - 37 seconds  PROTIME-INR     Status: None   Collection Time    11/24/12  8:15 AM      Result Value Range   Prothrombin Time 14.9  11.6 - 15.2 seconds   INR 1.19  0.00 - 1.49  BASIC METABOLIC PANEL     Status: Abnormal   Collection Time    11/24/12  8:15 AM      Result Value Range   Sodium 139  135 - 145 mEq/L   Potassium 3.1 (*) 3.5 -  5.1 mEq/L   Chloride 101  96 - 112 mEq/L   CO2 29  19 - 32 mEq/L   Glucose, Bld 97  70 - 99 mg/dL   BUN 3 (*) 6 - 23 mg/dL   Creatinine, Ser 5.62  0.50 - 1.10 mg/dL   Calcium 8.5  8.4 - 13.0 mg/dL   GFR calc non Af Amer >90  >90 mL/min   GFR calc Af Amer >90  >90 mL/min   Comment:            The eGFR has been calculated     using the CKD EPI equation.     This calculation has not been     validated in all clinical     situations.     eGFR's persistently     <90 mL/min signify     possible Chronic Kidney Disease.   Ct Pelvis W Contrast  11/24/2012  *RADIOLOGY REPORT*  Clinical Data:  Right-sided pelvic pain.  CT PELVIS WITH CONTRAST  Technique:  Multidetector CT imaging of the pelvis was performed using the standard protocol following the bolus administration of intravenous contrast.  Contrast: OMNIPAQUE IOHEXOL 300 MG/ML  SOLN  Comparison:  11/22/2012 ultrasound. CT scan 11/20/2012.  Findings:  Complex serpiginous adnexal fluid filled structures are noted bilaterally.  Findings most likely due to bilateral complex hydrosalpinx.  Tuboovarian abscess is possible on the right but felt to be less likely. Similar appearance to prior CT scan.  The uterus is unremarkable.  The bladder is normal.  No pelvic adenopathy.  There is a small amount of free pelvic fluid.  IMPRESSION: Bilateral complex hydrosalpinx, right greater than left.   Original Report Authenticated By: Rudie Meyer, M.D.     Review of Systems  See H&P from 3/16 for complete ROS  Last menstrual period 11/15/2012. Physical Exam  Constitutional: She is oriented to person, place, and time. She appears well-developed and well-nourished.  Cardiovascular: Normal rate and regular rhythm.   Respiratory: Effort normal.  Few insp wheezes, scatt rhonchi  GI: Soft. Bowel sounds are normal. There is no tenderness.  Musculoskeletal: Normal range of motion. She exhibits no edema.  Neurological: She is alert and oriented to person,  place, and time.     Assessment/Plan Pt with hx PID, leukocytosis, right pelvic pain and evidence of bilateral(R>L) hydro/pyosalpinx on recent CT. Plan is for CT guided aspiration/drainage of right pelvic ?tuboovarian abscess today. Details/risks of procedure d/w pt with her understanding and consent.  ALLRED,D KEVIN 11/25/2012, 12:16 PM

## 2012-11-25 NOTE — Procedures (Signed)
Interventional Radiology Procedure Note  Procedure: Successful placement of 40F drain into RIGHT tubo-ovarian abscess.  100 ml purulent, foul smelling and bloody fluid aspirated and sent for culture. Complications: None Recommendations: - Maintain to bulb suction - Once drainage < 20mL per day and leukocytosis has normalized, drain may be removed.   Signed,  Sterling Big, MD Vascular & Interventional Radiologist [facility] Radiology

## 2012-11-25 NOTE — H&P (Signed)
Agree with PA note.  The left pyosalpinx is significantly smaller than the right and will hopefully respond to IV abx.  We will place a drain in the right TOA.  Signed,  Sterling Big, MD Vascular & Interventional Radiologist [facility] Radiology

## 2012-11-26 MED ORDER — NICOTINE 7 MG/24HR TD PT24
7.0000 mg | MEDICATED_PATCH | Freq: Every day | TRANSDERMAL | Status: DC
  Administered 2012-11-26 – 2012-11-28 (×3): 7 mg via TRANSDERMAL
  Filled 2012-11-26 (×5): qty 1

## 2012-11-26 NOTE — Progress Notes (Signed)
11/26/12 1100  Clinical Encounter Type  Visited With Patient  Visit Type Initial;Spiritual support;Social support  Referral From Chaplain;Nurse  Spiritual Encounters  Spiritual Needs Emotional   Made initial visit to introduce spiritual care and to offer support and encouragement.  Ms Boissonneault reports that she is feeling much better, in terms of pain and nausea/vomiting, after the surgery.  She was in good spirits overall, using humor and gratitude to cope, though she's getting cabin fever and looking forward to being well enough to go home.    Provided pastoral presence, listening, and reflection, as well as some magazines for recreation/distraction.  Pt is aware of ongoing chaplain availability.  Will follow for support.  [address] [place], [place] [phone]

## 2012-11-26 NOTE — MAU Provider Note (Signed)
Chart reviewed and agree with management and plan.  

## 2012-11-26 NOTE — Progress Notes (Signed)
POD#1 IR DRAINAGE OF TOA  Subjective: Patient reports mild pain.  Tolerating regular diet, ambulating.     Objective: I have reviewed patient's vital signs, intake and output, medications, labs, microbiology and radiology results.BP 122/79  Pulse 66  Temp(Src) 98.7 F (37.1 C) (Oral)  Resp 18  SpO2 96%  LMP 11/15/2012 I/O last 3 completed shifts: In: 3200 [P.O.:240; I.V.:2750; Other:10; IV Piggyback:200] Out: 2891 [Urine:2250; Emesis/NG output:600; Drains:40; Stool:1]  General: alert and moderate distress Resp: clear to auscultation bilaterally Cardio: regular rate and rhythm, S1, S2 normal, no murmur, click, rub or gallop GI: normal findings: bowel sounds normal and abnormal findings:  diffusely tender.  Improved from initial exam Extremities: extremities normal, atraumatic, no cyanosis or edema JP site: no erythema  Results for orders placed during the hospital encounter of 11/25/12 (from the past 48 hour(s))  CULTURE, ROUTINE-ABSCESS     Status: None   Collection Time    11/25/12  1:10 PM      Result Value Range   Specimen Description ABSCESS RT OVARIAN     Special Requests Normal     Gram Stain       Value: ABUNDANT WBC PRESENT,BOTH PMN AND MONONUCLEAR     RARE SQUAMOUS EPITHELIAL CELLS PRESENT     FEW GRAM POSITIVE COCCI IN PAIRS   Culture NO GROWTH 1 DAY     Report Status PENDING      Assessment/Plan: Pt with bilateral TOA's. POD#1 s/p CT guided percutaneous drainage. Continue Zosyn for now.   Encourage ambulation Oral analgesia as needed Will continue routine GYN care and start disposition planning   LOS: 3 days    Lomax Poehler A 11/26/2012, 10:36 AM

## 2012-11-27 LAB — CBC
MCHC: 33.2 g/dL (ref 30.0–36.0)
Platelets: 314 10*3/uL (ref 150–400)
RBC: 3.54 MIL/uL — ABNORMAL LOW (ref 3.87–5.11)
RDW: 13.1 % (ref 11.5–15.5)

## 2012-11-27 MED ORDER — PNEUMOCOCCAL VAC POLYVALENT 25 MCG/0.5ML IJ INJ
0.5000 mL | INJECTION | INTRAMUSCULAR | Status: AC
  Administered 2012-11-27: 0.5 mL via INTRAMUSCULAR

## 2012-11-27 MED ORDER — BISACODYL 5 MG PO TBEC
5.0000 mg | DELAYED_RELEASE_TABLET | Freq: Every day | ORAL | Status: DC | PRN
  Administered 2012-11-27: 5 mg via ORAL
  Filled 2012-11-27: qty 1

## 2012-11-27 MED ORDER — INFLUENZA VIRUS VACC SPLIT PF IM SUSP
0.5000 mL | Freq: Once | INTRAMUSCULAR | Status: AC
  Administered 2012-11-27: 0.5 mL via INTRAMUSCULAR

## 2012-11-27 NOTE — Progress Notes (Signed)
Subjective: Patient reports incisional pain, tolerating PO and + flatus.  She would like another Dulcolax so she can have a BM.  Objective: I have reviewed patient's vital signs, intake and output, medications, labs and microbiology.  General: alert Resp: clear to auscultation bilaterally Cardio: regular rate and rhythm, S1, S2 normal, no murmur, click, rub or gallop GI: soft, non-tender; bowel sounds normal; no masses,  no organomegaly Her Abd exam is improved by her account.  Assessment/Plan: Right TOA now s/p IR drainage (about 8 cc in the last 24 hours). I will check a CBC to follow her WBC. Dulcolax prn I did broach the subject of definitive cure for TOA is TAH/BSO. She is somewhat saddened by this as she says that she would like another child (She has a 36 yo daughter at [facility]). I have stressed that this is not a decision that she has to make in the immediate future.   LOS: 4 days    Jasmeet Gehl C. 11/27/2012, 8:57 AM

## 2012-11-27 NOTE — Progress Notes (Signed)
UR completed 

## 2012-11-28 LAB — CULTURE, ROUTINE-ABSCESS

## 2012-11-28 NOTE — Progress Notes (Signed)
Subjective: Patient reports tolerating PO, + flatus and no problems voiding.    Objective: I have reviewed patient's vital signs, intake and output, medications, labs and microbiology.  General: alert and cooperative Resp: clear to auscultation bilaterally Cardio: regular rate and rhythm, S1, S2 normal, no murmur, click, rub or gallop GI: soft, non-tender; bowel sounds normal; no masses,  no organomegaly IR drain- 24 cc in last 24 hours (needs to be less than 20cc in order to remove it per IR)  Assessment/Plan: TOA s/p IR drainage. Improving clinically Continue Zosyn   LOS: 5 days    Marcina Kinnison C. 11/28/2012, 7:36 AM

## 2012-11-29 LAB — CULTURE, BLOOD (SINGLE)

## 2012-11-29 MED ORDER — LEVOFLOXACIN 500 MG PO TABS: 500.0000 mg | ORAL_TABLET | Freq: Every day | ORAL | Status: DC

## 2012-11-29 MED ORDER — OXYCODONE-ACETAMINOPHEN 5-325 MG PO TABS: 1.0000 | ORAL_TABLET | ORAL | Status: DC | PRN

## 2012-11-29 MED ORDER — IBUPROFEN 600 MG PO TABS: 600.0000 mg | ORAL_TABLET | Freq: Four times a day (QID) | ORAL | Status: DC | PRN

## 2012-11-29 MED ORDER — DOXYCYCLINE HYCLATE 100 MG PO CAPS: 100.0000 mg | ORAL_CAPSULE | Freq: Two times a day (BID) | ORAL | Status: DC

## 2012-11-29 NOTE — Progress Notes (Signed)
Discharge instructions provided to patient at bedside.  Medications, activity instructions, follow up appointments, when to call the doctor and community resources discussed.  No questions at this time.  Patient left un in stable condition accompanied by staff with all personal belongings and prescriptions.  Osvaldo Angst, RN------------

## 2012-11-29 NOTE — Discharge Summary (Signed)
Physician Discharge Summary  Patient ID: Samantha [name] MRN: [id number] DOB/AGE: 10/06/1975 37 y.o.  Admit date: 11/23/2012 Discharge date: 11/29/2012  Admission Diagnoses:Right tubo-ovarian abscess  Discharge Diagnoses: Right tubo-ovarian abscess Principal Problem:   TOA (tubo-ovarian abscess)   Discharged Condition: good  Hospital Course: Admitted with lower abdominal pain and right TOA was diagnosed. IR at [facility] was consulted and CT guided placement of drain in R TOA was placed. She was continued on Zosyn and she had clinical improvement, drain removed intact on 11/29/12, discharged on oral ABX  Consults: interventional radiology  Significant Diagnostic Studies: radiology: CT scan: pelvic  and Ultrasound: pelvic  Treatments: antibiotics: Zosyn  Discharge Exam: Blood pressure 131/78, pulse 54, temperature 98.2 F (36.8 C), temperature source Oral, resp. rate 18, height 5\' 4"  (1.626 m), weight 130 lb (58.968 kg), last menstrual period 11/15/2012, SpO2 99.00%. General appearance: alert, cooperative and no distress GI: soft, non-tender; bowel sounds normal; no masses,  no organomegaly Drain removed from right buttocks intact and dressing applied Disposition: 01-Home or Self Care     Medication List    TAKE these medications       acetaminophen 500 MG tablet  Commonly known as:  TYLENOL  Take 500 mg by mouth every 6 (six) hours as needed for pain.     ibuprofen 600 MG tablet  Commonly known as:  ADVIL,MOTRIN  Take 1 tablet (600 mg total) by mouth every 6 (six) hours as needed for pain.     oxyCODONE-acetaminophen 5-325 MG per tablet  Commonly known as:  PERCOCET/ROXICET  Take 1-2 tablets by mouth every 4 (four) hours as needed.      Levaquin 500 mg and doxycycline 100 mg BID, 10 days     Follow-up Information   Follow up with [facility]. Call in 2 weeks.    Report if Sx worsen  Signed: ARNOLD,JAMES 11/29/2012, 10:23 AM

## 2012-12-01 ENCOUNTER — Telehealth: Payer: Self-pay | Admitting: General Practice

## 2012-12-01 NOTE — Telephone Encounter (Signed)
Patient called and left message stating she was just released the other day and was given a Rx for two antibiotics and she has picked up one antibiotic but cannot afford the other one and would like some assistance in helping purchasing the other. Called patient and told her I received her message about not being able to afford an antibiotic and confirmed with patient that was the levaquin and she stated yes. Told patient I called [facility] and they could get her the antibiotic for $10-$20 and they are filling that Rx now for her. Patient was satisfied, verbalized understanding, and had no further questions.

## 2012-12-02 ENCOUNTER — Telehealth: Payer: Self-pay

## 2012-12-02 MED ORDER — LEVOFLOXACIN 500 MG PO TABS: 500.0000 mg | ORAL_TABLET | Freq: Every day | ORAL | Status: DC

## 2012-12-02 MED ORDER — FLUCONAZOLE 150 MG PO TABS: 150.0000 mg | ORAL_TABLET | Freq: Once | ORAL | Status: DC

## 2012-12-02 NOTE — Telephone Encounter (Signed)
Pt called clinic and stated that she is at the [facility] pharmacy and they do not have the order for her antibiotic. I reviewed notes from Marylynn Pearson RN yesterday which states that the Rx for Levaquin was going to be sent to [facility] pharmacy. Per order list, it was not sent. I sent the order to [facility] pharmacy via [facility].  Pt also reports having vaginal itching which has started since she began taking the doxycycline 3 days ago. Rx for diflucan e-prescribed per protocol.  Pt voiced understanding.

## 2012-12-02 NOTE — Telephone Encounter (Signed)
Pt. Called in and left her name and number.  No other information was left.  I called patient back to find out her reason for her call and I got what I think was an answering machine, but there was no option to leave a message.  Will try to call patient back again.

## 2012-12-17 ENCOUNTER — Encounter: Payer: Self-pay | Admitting: Obstetrics & Gynecology

## 2012-12-17 ENCOUNTER — Ambulatory Visit (INDEPENDENT_AMBULATORY_CARE_PROVIDER_SITE_OTHER): Payer: Self-pay | Admitting: Obstetrics & Gynecology

## 2012-12-17 VITALS — BP 123/86 | HR 77 | Ht 64.0 in | Wt 145.2 lb

## 2012-12-17 DIAGNOSIS — A499 Bacterial infection, unspecified: Secondary | ICD-10-CM

## 2012-12-17 DIAGNOSIS — N7093 Salpingitis and oophoritis, unspecified: Secondary | ICD-10-CM

## 2012-12-17 DIAGNOSIS — N760 Acute vaginitis: Secondary | ICD-10-CM

## 2012-12-17 DIAGNOSIS — B9689 Other specified bacterial agents as the cause of diseases classified elsewhere: Secondary | ICD-10-CM

## 2012-12-17 NOTE — Patient Instructions (Signed)
Return to clinic for any scheduled appointments or for any gynecologic concerns as needed.   

## 2012-12-17 NOTE — Progress Notes (Signed)
GYNECOLOGY CLINIC PROGRESS NOTE  History:  37 y.o. [id number] here today for followup after recent admission for right TOA. That was drained by IR.  Drain has been removed, site healed well.  Denies any pain currently.  Feels that she has a yeast infection given all the antibiotics she was given. No other concerns.  The following portions of the patient's history were reviewed and updated as appropriate: allergies, current medications, past family history, past medical history, past social history, past surgical history and problem list.  Review of Systems:  Pertinent items are noted in HPI.  Objective:  Physical Exam BP 123/86  Pulse 77  Ht 5\' 4"  (1.626 m)  Wt 145 lb 3.2 oz (65.862 kg)  BMI 24.91 kg/m2  LMP 11/15/2012 Gen: NAD Abd: Soft, nontender and nondistended Pelvic: Normal appearing external genitalia; normal appearing vaginal mucosa and cervix.  White vaginal discharge seen, wet prep obtained.  Small uterus, no other palpable masses, no uterine or adnexal tenderness  Labs and Imaging Ct Abdomen Pelvis Wo Contrast  11/20/2012  *RADIOLOGY REPORT*  Clinical Data: Umbilical and right lower quadrant pain with nausea, vomiting and diarrhea.  CT ABDOMEN AND PELVIS WITHOUT CONTRAST  Technique:  Multidetector CT imaging of the abdomen and pelvis was performed following the standard protocol without intravenous contrast.  Comparison: None.  Findings: Lung bases show no acute findings.  Heart size normal. No pericardial or pleural effusion.  Liver, gallbladder, adrenal glands, kidneys, spleen, pancreas, stomach and bowel, including appendix, are unremarkable.  Probable small pelvic free fluid.  Uterus is visualized.  Ovaries are not well visualized.  No pathologically enlarged lymph nodes.  No worrisome lytic or sclerotic lesions.  IMPRESSION:  Suspect small pelvic free fluid.  No additional findings to explain the patient's given symptoms.  No evidence of acute appendicitis. No urinary stones or  obstruction.   Original Report Authenticated By: Leanna Battles, M.D.    Ct Pelvis W Contrast  11/24/2012  *RADIOLOGY REPORT*  Clinical Data:  Right-sided pelvic pain.  CT PELVIS WITH CONTRAST  Technique:  Multidetector CT imaging of the pelvis was performed using the standard protocol following the bolus administration of intravenous contrast.  Contrast: OMNIPAQUE IOHEXOL 300 MG/ML  SOLN  Comparison:  11/22/2012 ultrasound. CT scan 11/20/2012.  Findings:  Complex serpiginous adnexal fluid filled structures are noted bilaterally.  Findings most likely due to bilateral complex hydrosalpinx.  Tuboovarian abscess is possible on the right but felt to be less likely. Similar appearance to prior CT scan.  The uterus is unremarkable.  The bladder is normal.  No pelvic adenopathy.  There is a small amount of free pelvic fluid.  IMPRESSION: Bilateral complex hydrosalpinx, right greater than left.   Original Report Authenticated By: Rudie Meyer, M.D.    US Transvaginal Non-ob  11/22/2012  *RADIOLOGY REPORT*  Clinical Data: Abdominal pain.  Nausea, emesis.  Right lower quadrant pain.  Prior LEEP procedure.  LMP 11/15/2012.  TRANSABDOMINAL AND TRANSVAGINAL ULTRASOUND OF PELVIS Technique:  Both transabdominal and transvaginal ultrasound examinations of the pelvis were performed. Transabdominal technique was performed for global imaging of the pelvis including uterus, ovaries, adnexal regions, and pelvic cul-de-sac.  It was necessary to proceed with endovaginal exam following the transabdominal exam to visualize the uterus, endometrium, ovaries, adnexal regions.  Comparison:  CT of the abdomen and pelvis 11/20/2012  Findings:  Uterus: Retroflexed.  8.1 x 4.2 x 5.3 cm.  Endometrium: Small amountof lower uterine segment fluid.  10 mm in thickness.  Right ovary:  8.1 x 7.2 x 5.3 cm complex cystic /tubular adnexal mass  Left ovary: 6.4 x 3.8 x 4.5 cm complex cystic mass.  Other findings: Trace free pelvic fluid  identified.  IMPRESSION:  1.  Retroverted uterus with small amount of lower uterine segment fluid. 2.  Complex bilateral adnexal masses.  On the right, the appearance favors tubal ovarian abscess.  Neoplasms are a consideration bilaterally.  Correlation with other clinical data is recommended. Follow-up is recommended to document resolution.   Original Report Authenticated By: Norva Pavlov, M.D.    US Pelvis Complete  11/22/2012  *RADIOLOGY REPORT*  Clinical Data: Abdominal pain.  Nausea, emesis.  Right lower quadrant pain.  Prior LEEP procedure.  LMP 11/15/2012.  TRANSABDOMINAL AND TRANSVAGINAL ULTRASOUND OF PELVIS Technique:  Both transabdominal and transvaginal ultrasound examinations of the pelvis were performed. Transabdominal technique was performed for global imaging of the pelvis including uterus, ovaries, adnexal regions, and pelvic cul-de-sac.  It was necessary to proceed with endovaginal exam following the transabdominal exam to visualize the uterus, endometrium, ovaries, adnexal regions.  Comparison:  CT of the abdomen and pelvis 11/20/2012  Findings:  Uterus: Retroflexed.  8.1 x 4.2 x 5.3 cm.  Endometrium: Small amountof lower uterine segment fluid.  10 mm in thickness.  Right ovary:  8.1 x 7.2 x 5.3 cm complex cystic /tubular adnexal mass  Left ovary: 6.4 x 3.8 x 4.5 cm complex cystic mass.  Other findings: Trace free pelvic fluid identified.  IMPRESSION:  1.  Retroverted uterus with small amount of lower uterine segment fluid. 2.  Complex bilateral adnexal masses.  On the right, the appearance favors tubal ovarian abscess.  Neoplasms are a consideration bilaterally.  Correlation with other clinical data is recommended. Follow-up is recommended to document resolution.   Original Report Authenticated By: Norva Pavlov, M.D.    Ct Image Guided Drainage By Percutaneous Catheter  11/25/2012  *RADIOLOGY REPORT*  CT GUIDED ABSCESS DRAIN  Date: 11/25/2012  Clinical History: 37 year old female with  bilateral right larger than left tubal ovarian abscess sees.  She is transferred from [facility] for percutaneous drain placement within the right tubal ovarian abscess collection.  Procedures Performed: 1. CT guided abscess drain placement  Interventional Radiologist:  Sterling Big, MD  Sedation: Moderate (conscious) sedation was used.  Five mg Versed, 250 mcg Fentanyl were administered intravenously.  The patient's vital signs were monitored continuously by radiology nursing throughout the procedure.  Sedation Time: 28 minutes  PROCEDURE/FINDINGS:   Informed consent was obtained from the patient following explanation of the procedure, risks, benefits and alternatives. The patient understands, agrees and consents for the procedure. All questions were addressed. A time out was performed.  Maximal barrier sterile technique utilized including caps, mask, sterile gowns, sterile gloves, large sterile drape, hand hygiene, and betadine skin prep.  A planning axial CT scan was performed.  The right tubal ovarian abscess was identified.  A suitable skin entry site was marked. Local anesthesia was achieved by infiltration of 1% lidocaine. Under CT fluoroscopic guidance, an 18 gauge trocar needle was carefully advanced through the sacrospinous ligament and into the tubal ovarian abscess.  A wire was advanced into the fluid collection and the tract serially dilated to 12-French.  A 12- [place] Cook all-purpose drainage catheter was then advanced over the wire and formed within the tubal ovarian abscess. Approximately 100 ml of frankly purulent, foul-smelling and bloody fluid was then successfully aspirated. The cavity was then lavaged with 20 ml of sterile saline and  this was re aspirated.  The tube was secured to the skin with 0-prolene suture and bumper technique. A sterile bandage was placed. The drain was connected to bulb suction.  The patient tolerated the procedure well, there is no immediate complication.   IMPRESSION:  Successful placement of a 12-French drainage catheter into the right tubo-ovarian abscess under CT guidance.  Approximately 100 ml of frankly purulent and foul-smelling, bloody fluid was aspirated and sent for culture.  Drain management:  Maintain drain to bulb suction.  Once output is less than 20 ml per day for two consecutive days and leukocytosis and systemic symptoms have resolved, the drain can be removed.  Signed,  Sterling Big, MD Vascular & Interventional Radiologist [facility]   Original Report Authenticated By: Malachy Moan, M.D.     Assessment & Plan:  No pain after drainage of R TOA on 11/25/12.  Doing well Will follow up wet prep results and manage accordingly

## 2012-12-18 ENCOUNTER — Telehealth: Payer: Self-pay

## 2012-12-18 LAB — WET PREP, GENITAL: Trich, Wet Prep: NONE SEEN

## 2012-12-18 MED ORDER — METRONIDAZOLE 500 MG PO TABS: 500.0000 mg | ORAL_TABLET | Freq: Two times a day (BID) | ORAL | Status: AC

## 2012-12-18 NOTE — Addendum Note (Signed)
Addended by: Jaynie Collins A on: 12/18/2012 07:59 AM   Modules accepted: Orders

## 2012-12-18 NOTE — Telephone Encounter (Addendum)
Pt called and stated that someone called her yesterday.  She stated that may be about results from her visit yesterday 12/17/12.  Her Wet prep resulted in BV in which I informed pt and that we will send an Rx for flagyl.  Pt stated understanding and did not have any further questions.

## 2012-12-19 ENCOUNTER — Telehealth: Payer: Self-pay | Admitting: Obstetrics and Gynecology

## 2012-12-19 NOTE — Telephone Encounter (Addendum)
Message copied by Toula Moos on Fri Dec 19, 2012  8:20 AM  ------CALLED PATIENT; no voicemail available. Will try again.        Message from: Jaynie Collins A      Created: Thu Dec 18, 2012  7:59 AM       Wet prep shows BV, no yeast. Flagyl eprescribed. Please call to inform patient of results and let her know to pick up her prescription.       ------

## 2012-12-22 ENCOUNTER — Telehealth: Payer: Self-pay | Admitting: Obstetrics and Gynecology

## 2012-12-22 NOTE — Telephone Encounter (Signed)
Patient called states returning call. Advised patient of BV result from 12/18/12 and Rx Flagyl e-prescribed to pharmacy on file. Patient agrees and satisfied. Advised also that she will get a letter about this since we have been trying to call her. Patient satisfied.

## 2012-12-22 NOTE — Telephone Encounter (Signed)
Called pt and music kept playing but was unable to leave message.  Sent certified letter.

## 2012-12-24 ENCOUNTER — Telehealth: Payer: Self-pay | Admitting: *Deleted

## 2012-12-24 NOTE — Telephone Encounter (Signed)
I spoke with patient she will call us back when she is able to afford the medicine so we can resend.

## 2012-12-24 NOTE — Telephone Encounter (Signed)
Patient left a message stating that she can't afford to pick up her Flagyl for the BV.

## 2013-03-04 ENCOUNTER — Emergency Department (HOSPITAL_COMMUNITY)
Admission: EM | Admit: 2013-03-04 | Discharge: 2013-03-04 | Disposition: A | Discharge status: Home / Self Care | Payer: Self-pay | Attending: Emergency Medicine | Admitting: Emergency Medicine

## 2013-03-04 ENCOUNTER — Emergency Department (HOSPITAL_COMMUNITY): Payer: Self-pay

## 2013-03-04 ENCOUNTER — Encounter (HOSPITAL_COMMUNITY): Payer: Self-pay | Admitting: *Deleted

## 2013-03-04 DIAGNOSIS — F17200 Nicotine dependence, unspecified, uncomplicated: Secondary | ICD-10-CM | POA: Insufficient documentation

## 2013-03-04 DIAGNOSIS — R6883 Chills (without fever): Secondary | ICD-10-CM | POA: Insufficient documentation

## 2013-03-04 DIAGNOSIS — R209 Unspecified disturbances of skin sensation: Secondary | ICD-10-CM | POA: Insufficient documentation

## 2013-03-04 DIAGNOSIS — R51 Headache: Secondary | ICD-10-CM | POA: Insufficient documentation

## 2013-03-04 DIAGNOSIS — N390 Urinary tract infection, site not specified: Secondary | ICD-10-CM | POA: Insufficient documentation

## 2013-03-04 DIAGNOSIS — H53149 Visual discomfort, unspecified: Secondary | ICD-10-CM | POA: Insufficient documentation

## 2013-03-04 DIAGNOSIS — R1110 Vomiting, unspecified: Secondary | ICD-10-CM | POA: Insufficient documentation

## 2013-03-04 DIAGNOSIS — R519 Headache, unspecified: Secondary | ICD-10-CM

## 2013-03-04 DIAGNOSIS — R4789 Other speech disturbances: Secondary | ICD-10-CM | POA: Insufficient documentation

## 2013-03-04 LAB — CBC
HCT: 38.7 % (ref 36.0–46.0)
Hemoglobin: 13.9 g/dL (ref 12.0–15.0)
RDW: 12.8 % (ref 11.5–15.5)
WBC: 5.9 10*3/uL (ref 4.0–10.5)

## 2013-03-04 LAB — POCT I-STAT TROPONIN I: Troponin i, poc: 0.02 ng/mL (ref 0.00–0.08)

## 2013-03-04 LAB — RAPID URINE DRUG SCREEN, HOSP PERFORMED
Barbiturates: NOT DETECTED
Tetrahydrocannabinol: POSITIVE — AB

## 2013-03-04 LAB — URINE MICROSCOPIC-ADD ON

## 2013-03-04 LAB — URINALYSIS, ROUTINE W REFLEX MICROSCOPIC
Glucose, UA: NEGATIVE mg/dL
Ketones, ur: 15 mg/dL — AB
Protein, ur: 30 mg/dL — AB
pH: 6 (ref 5.0–8.0)

## 2013-03-04 LAB — COMPREHENSIVE METABOLIC PANEL
ALT: <5 U/L (ref 0–35)
Albumin: 3.6 g/dL (ref 3.5–5.2)
Alkaline Phosphatase: 64 U/L (ref 39–117)
BUN: 7 mg/dL (ref 6–23)
Chloride: 103 mEq/L (ref 96–112)
Glucose, Bld: 86 mg/dL (ref 70–99)
Potassium: 3.7 mEq/L (ref 3.5–5.1)
Sodium: 138 mEq/L (ref 135–145)
Total Bilirubin: 0.7 mg/dL (ref 0.3–1.2)

## 2013-03-04 LAB — D-DIMER, QUANTITATIVE: D-Dimer, Quant: 0.45 ug/mL-FEU (ref 0.00–0.48)

## 2013-03-04 MED ORDER — CIPROFLOXACIN HCL 500 MG PO TABS: 500.0000 mg | ORAL_TABLET | Freq: Two times a day (BID) | ORAL | Status: DC

## 2013-03-04 MED ORDER — SODIUM CHLORIDE 0.9 % IV BOLUS (SEPSIS)
1000.0000 mL | Freq: Once | INTRAVENOUS | Status: AC
  Administered 2013-03-04: 1000 mL via INTRAVENOUS

## 2013-03-04 MED ORDER — METOCLOPRAMIDE HCL 5 MG/ML IJ SOLN
10.0000 mg | Freq: Once | INTRAMUSCULAR | Status: AC
  Administered 2013-03-04: 10 mg via INTRAVENOUS
  Filled 2013-03-04: qty 2

## 2013-03-04 MED ORDER — KETOROLAC TROMETHAMINE 30 MG/ML IJ SOLN: 30.0000 mg | Freq: Once | INTRAMUSCULAR | Status: DC

## 2013-03-04 MED ORDER — SODIUM CHLORIDE 0.9 % IV BOLUS (SEPSIS)
500.0000 mL | Freq: Once | INTRAVENOUS | Status: AC
  Administered 2013-03-04: 500 mL via INTRAVENOUS

## 2013-03-04 MED ORDER — DIPHENHYDRAMINE HCL 50 MG/ML IJ SOLN
25.0000 mg | Freq: Once | INTRAMUSCULAR | Status: AC
  Administered 2013-03-04: 25 mg via INTRAVENOUS
  Filled 2013-03-04: qty 1

## 2013-03-04 NOTE — ED Provider Notes (Signed)
History    CSN: [id number] Arrival date & time 03/04/13  1055  First MD Initiated Contact with Patient 03/04/13 1322     Chief Complaint  Patient presents with  . Headache   (Consider location/radiation/quality/duration/timing/severity/associated sxs/prior Treatment) HPI  The patient is a 37 year old female was admitted to the emergency department for worsening right-sided arm numbness and tingling w/ associated gradual onset left sided throbbing headache. Patient states last evening her daughter told her she was having memory and speech difficulties, but did not come into the ED because after she awoke from her nap all of her symptoms had resolved except her headache. Patient states she came into the ED because her headache has still not resolved along with associated photophobia. Rates her pain currently at 8/10. Denies fevers, nausea, vomiting, visual disturbance. Patient denies any history of headaches in the past.   Past Medical History  Diagnosis Date  . Back pain   . Medical history non-contributory    Past Surgical History  Procedure Laterality Date  . Leep     Family History  Problem Relation Age of Onset  . Anesthesia problems Neg Hx   . Other Neg Hx    History  Substance Use Topics  . Smoking status: Current Every Day Smoker -- 1.00 packs/day    Types: Cigars  . Smokeless tobacco: Not on file  . Alcohol Use: Yes     Comment: occasionally   OB History   Grav Para Term Preterm Abortions TAB SAB Ect Mult Living   2    1  1   1      Review of Systems  Constitutional: Positive for chills. Negative for fever.  HENT: Negative for neck pain and neck stiffness.   Eyes: Positive for photophobia.  Respiratory: Negative for shortness of breath.   Cardiovascular: Negative for chest pain.  Gastrointestinal: Positive for vomiting. Negative for nausea.  Genitourinary: Negative.   Musculoskeletal: Negative.   Skin: Negative.   Neurological: Positive for speech  difficulty, numbness and headaches.    Allergies  Review of patient's allergies indicates no known allergies.  Home Medications   Current Outpatient Rx  Name  Route  Sig  Dispense  Refill  . acetaminophen (TYLENOL) 500 MG tablet   Oral   Take 1,000 mg by mouth every 6 (six) hours as needed for pain.         Marland Kitchen PRESCRIPTION MEDICATION   Oral   Take 1 tablet by mouth as needed (muscle relaxer).         . ciprofloxacin (CIPRO) 500 MG tablet   Oral   Take 1 tablet (500 mg total) by mouth 2 (two) times daily.   6 tablet   0    BP 105/59  Pulse 106  Temp(Src) 98.6 F (37 C) (Oral)  Resp 18  SpO2 99%  LMP 03/04/2013 Physical Exam  Constitutional: She is oriented to person, place, and time. She appears well-developed and well-nourished.  HENT:  Head: Normocephalic and atraumatic.  Eyes: Conjunctivae and EOM are normal. Pupils are equal, round, and reactive to light.  Cardiovascular: Normal rate, regular rhythm and normal heart sounds.   Pulmonary/Chest: Effort normal and breath sounds normal.  Abdominal: Soft. Bowel sounds are normal. There is no tenderness.  Neurological: She is alert and oriented to person, place, and time. She has normal strength. No cranial nerve deficit. GCS eye subscore is 4. GCS verbal subscore is 5. GCS motor subscore is 6.  Subjective decreased gross sensation LUE  and RLE.  No pronator drift. Heel-knee-shin intact bilaterally.  No aphasia.   Skin: Skin is warm and dry.    ED Course  Procedures (including critical care time)  Medications  metoCLOPramide (REGLAN) injection 10 mg (10 mg Intravenous Given 03/04/13 1458)  diphenhydrAMINE (BENADRYL) injection 25 mg (25 mg Intravenous Given 03/04/13 1452)  sodium chloride 0.9 % bolus 500 mL (0 mLs Intravenous Stopped 03/04/13 1519)  sodium chloride 0.9 % bolus 1,000 mL (1,000 mLs Intravenous New Bag/Given 03/04/13 1520)     Date: 03/04/2013  Rate: 86  Rhythm: normal sinus rhythm and premature  atrial contractions (PAC)  QRS Axis: normal  Intervals: borderline QTc prolongation  ST/T Wave abnormalities: normal  Conduction Disutrbances:none  Narrative Interpretation:   Old EKG Reviewed: none available   Labs Reviewed  URINE RAPID DRUG SCREEN (HOSP PERFORMED) - Abnormal; Notable for the following:    Tetrahydrocannabinol POSITIVE (*)    All other components within normal limits  URINALYSIS, ROUTINE W REFLEX MICROSCOPIC - Abnormal; Notable for the following:    Color, Urine RED (*)    APPearance CLOUDY (*)    Hgb urine dipstick LARGE (*)    Bilirubin Urine SMALL (*)    Ketones, ur 15 (*)    Protein, ur 30 (*)    Leukocytes, UA SMALL (*)    All other components within normal limits  URINE MICROSCOPIC-ADD ON - Abnormal; Notable for the following:    Squamous Epithelial / LPF FEW (*)    Bacteria, UA FEW (*)    All other components within normal limits  CBC  COMPREHENSIVE METABOLIC PANEL  PREGNANCY, URINE  D-DIMER, QUANTITATIVE  POCT I-STAT TROPONIN I   Ct Head Wo Contrast  03/04/2013   *RADIOLOGY REPORT*  Clinical Data:  Headaches, right arm parasthesias  CT HEAD WITHOUT CONTRAST  Technique:  Contiguous axial images were obtained from the base of the skull through the vertex without contrast  Comparison:  02/08/2004  Findings:  The brain has a normal appearance without evidence for hemorrhage, acute infarction, hydrocephalus, or mass lesion.  There is no extra axial fluid collection.  The skull and paranasal sinuses are normal.  IMPRESSION: Normal CT of the head without contrast.   Original Report Authenticated By: Judie Petit. Shick, M.D.   1. Headache   2. UTI (urinary tract infection)     MDM  On repeat examination headache and associated symptoms improved, no sensory deficits. VSS. Advised to follow up with neurologist to r/o any demyelinating process such as multiple sclerosis. No concern for acute emergent cause of headache at this time. Labs, imaging, and EKG reviewed. Patient  d/w with Dr. Silverio Lay, agrees with plan. Patient is stable at time of discharge          Jeannetta Ellis, PA-C 03/05/13 1044

## 2013-03-04 NOTE — ED Notes (Signed)
Pt in c/o headache, pt states that yesterday afternoon she was driving home from work yesterday and developed right arm numbness and tingling, pt states she had no use of her right arm, pt states she then developed a headache and numbness in her face, pt states her daughter told her that her face didn't look right. Pt also had episode of memory loss where she did not remember the name of her friend that she lives with and her daughters name. Pt states during all of this she maintained the headache, also developed episodes of vomiting. Pt states she was told by her daughter that when she was speaking to them last night her speech was incomprehensible. Pt states she eventually fell asleep, she then woke back up around 9pm last night and symptoms had all resolved except for the headache. Pt continues to c/o headache at this time. Pt alert and oriented at this time, moving all extremities equally, no neuro deficits noted. Pt denies return of any symptoms other than headache since last night.

## 2013-03-06 NOTE — ED Provider Notes (Signed)
Medical screening examination/treatment/procedure(s) were conducted as a shared visit with non-physician practitioner(s) and myself.  I personally evaluated the patient during the encounter  Samantha [name] is a 37 y.o. female here with headache, numbness and tingling. Yesterday, she was told by her daughter that she was forgetful and had some trouble speaking. Today, she has no trouble speaking but has some headache with mild blurry vision. Also has R arm numbness and L leg numbness. Vitals showed irregular HR between 90s- 110s, sinus arrhythmia. I didn't appreciate facial droop or decreased sensation anywhere. She has nl strength throughout. D-dimer neg. CT head nl. I doubt that she has a stroke. She has no follow up. I still consider MS but I think she is stable to see a neurologist and have workup outpatient. Will also recommend TSH outpatient to work up the tachycardia. Return precautions given.    Richardean Canal, MD 03/06/13 [phone]

## 2013-05-12 ENCOUNTER — Encounter: Payer: Self-pay | Admitting: *Deleted

## 2013-06-01 ENCOUNTER — Emergency Department (HOSPITAL_COMMUNITY)
Admission: EM | Admit: 2013-06-01 | Discharge: 2013-06-01 | Disposition: A | Discharge status: Home / Self Care | Payer: Medicaid Other | Attending: Emergency Medicine | Admitting: Emergency Medicine

## 2013-06-01 ENCOUNTER — Encounter (HOSPITAL_COMMUNITY): Payer: Self-pay | Admitting: Emergency Medicine

## 2013-06-01 ENCOUNTER — Emergency Department (HOSPITAL_COMMUNITY): Payer: Medicaid Other

## 2013-06-01 DIAGNOSIS — J069 Acute upper respiratory infection, unspecified: Secondary | ICD-10-CM | POA: Insufficient documentation

## 2013-06-01 DIAGNOSIS — Z789 Other specified health status: Secondary | ICD-10-CM | POA: Insufficient documentation

## 2013-06-01 DIAGNOSIS — J45909 Unspecified asthma, uncomplicated: Secondary | ICD-10-CM | POA: Insufficient documentation

## 2013-06-01 DIAGNOSIS — F17200 Nicotine dependence, unspecified, uncomplicated: Secondary | ICD-10-CM | POA: Insufficient documentation

## 2013-06-01 DIAGNOSIS — R5381 Other malaise: Secondary | ICD-10-CM | POA: Insufficient documentation

## 2013-06-01 DIAGNOSIS — IMO0001 Reserved for inherently not codable concepts without codable children: Secondary | ICD-10-CM | POA: Insufficient documentation

## 2013-06-01 MED ORDER — HYDROCODONE-HOMATROPINE 5-1.5 MG/5ML PO SYRP: 2.5000 mL | ORAL_SOLUTION | Freq: Four times a day (QID) | ORAL | Status: DC | PRN

## 2013-06-01 MED ORDER — DEXAMETHASONE SODIUM PHOSPHATE 10 MG/ML IJ SOLN
10.0000 mg | Freq: Once | INTRAMUSCULAR | Status: AC
  Administered 2013-06-01: 10 mg via INTRAMUSCULAR
  Filled 2013-06-01: qty 1

## 2013-06-01 MED ORDER — ALBUTEROL SULFATE (5 MG/ML) 0.5% IN NEBU
5.0000 mg | INHALATION_SOLUTION | Freq: Once | RESPIRATORY_TRACT | Status: AC
  Administered 2013-06-01: 5 mg via RESPIRATORY_TRACT
  Filled 2013-06-01: qty 1

## 2013-06-01 MED ORDER — ALBUTEROL SULFATE HFA 108 (90 BASE) MCG/ACT IN AERS
2.0000 | INHALATION_SPRAY | Freq: Once | RESPIRATORY_TRACT | Status: AC
  Administered 2013-06-01: 2 via RESPIRATORY_TRACT
  Filled 2013-06-01: qty 6.7

## 2013-06-01 MED ORDER — GUAIFENESIN 200 MG PO TABS: 400.0000 mg | ORAL_TABLET | ORAL | Status: DC | PRN
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
## 2013-06-01 NOTE — ED Notes (Signed)
Since Wednesday, chills, coughing, Friday night body aches, has taken alka seltzer and tylenol cold without results, states chest feels heavy, unable to sleep

## 2013-06-01 NOTE — Progress Notes (Signed)
[facility] provided pt with a list of primary care resources and a [facility] [employer] [facility] application.  °

## 2013-06-02 NOTE — ED Provider Notes (Signed)
Medical screening examination/treatment/procedure(s) were performed by non-physician practitioner and as supervising physician I was immediately available for consultation/collaboration.   Somer Trotter E Evie Crumpler, MD 06/02/13 1101 

## 2013-09-06 ENCOUNTER — Emergency Department (HOSPITAL_COMMUNITY): Payer: Medicaid Other

## 2013-09-06 ENCOUNTER — Emergency Department (HOSPITAL_COMMUNITY)
Admission: EM | Admit: 2013-09-06 | Discharge: 2013-09-07 | Disposition: A | Discharge status: Home / Self Care | Payer: Medicaid Other | Attending: Emergency Medicine | Admitting: Emergency Medicine

## 2013-09-06 ENCOUNTER — Encounter (HOSPITAL_COMMUNITY): Payer: Self-pay | Admitting: Emergency Medicine

## 2013-09-06 DIAGNOSIS — R197 Diarrhea, unspecified: Secondary | ICD-10-CM | POA: Insufficient documentation

## 2013-09-06 DIAGNOSIS — D72829 Elevated white blood cell count, unspecified: Secondary | ICD-10-CM | POA: Insufficient documentation

## 2013-09-06 DIAGNOSIS — R5381 Other malaise: Secondary | ICD-10-CM | POA: Insufficient documentation

## 2013-09-06 DIAGNOSIS — J111 Influenza due to unidentified influenza virus with other respiratory manifestations: Secondary | ICD-10-CM | POA: Insufficient documentation

## 2013-09-06 DIAGNOSIS — F17200 Nicotine dependence, unspecified, uncomplicated: Secondary | ICD-10-CM | POA: Insufficient documentation

## 2013-09-06 DIAGNOSIS — R52 Pain, unspecified: Secondary | ICD-10-CM | POA: Insufficient documentation

## 2013-09-06 LAB — CBC WITH DIFFERENTIAL/PLATELET
Basophils Relative: 0 % (ref 0–1)
Eosinophils Relative: 1 % (ref 0–5)
HCT: 39 % (ref 36.0–46.0)
Hemoglobin: 13.5 g/dL (ref 12.0–15.0)
Lymphs Abs: 4.6 10*3/uL — ABNORMAL HIGH (ref 0.7–4.0)
MCH: 31.2 pg (ref 26.0–34.0)
MCV: 90.1 fL (ref 78.0–100.0)
Monocytes Absolute: 0.8 10*3/uL (ref 0.1–1.0)
Neutro Abs: 5.3 10*3/uL (ref 1.7–7.7)
RBC: 4.33 MIL/uL (ref 3.87–5.11)

## 2013-09-06 MED ORDER — GUAIFENESIN-CODEINE 100-10 MG/5ML PO SOLN: 5.0000 mL | Freq: Three times a day (TID) | ORAL | Status: DC | PRN

## 2013-09-06 MED ORDER — PREDNISONE 20 MG PO TABS
60.0000 mg | ORAL_TABLET | Freq: Once | ORAL | Status: AC
  Administered 2013-09-07: 60 mg via ORAL
  Filled 2013-09-06: qty 3

## 2013-09-06 MED ORDER — IBUPROFEN 200 MG PO TABS
600.0000 mg | ORAL_TABLET | Freq: Once | ORAL | Status: AC
  Administered 2013-09-07: 600 mg via ORAL
  Filled 2013-09-06: qty 3

## 2013-09-06 MED ORDER — ALBUTEROL SULFATE HFA 108 (90 BASE) MCG/ACT IN AERS
2.0000 | INHALATION_SPRAY | Freq: Once | RESPIRATORY_TRACT | Status: AC
  Administered 2013-09-07: 2 via RESPIRATORY_TRACT

## 2013-09-06 MED ORDER — ALBUTEROL SULFATE (5 MG/ML) 0.5% IN NEBU
2.5000 mg | INHALATION_SOLUTION | Freq: Once | RESPIRATORY_TRACT | Status: AC
  Administered 2013-09-06: 2.5 mg via RESPIRATORY_TRACT

## 2013-09-06 NOTE — ED Notes (Signed)
Pt reports generalized body aches, dry cough, nasal congestion, and decreased appetite that started on 09/01/2013. Pt is A/O x4, in NAD, and vital signs are WDL.

## 2013-09-06 NOTE — ED Provider Notes (Signed)
CSN: [id number]     Arrival date & time 09/06/13  1911 History   First MD Initiated Contact with Patient 09/06/13 2235     Chief Complaint  Patient presents with  . Generalized Body Aches  . Cough   (Consider location/radiation/quality/duration/timing/severity/associated sxs/prior Treatment) Patient is a 37 y.o. female presenting with cough. The history is provided by the patient. No language interpreter was used.  Cough Cough characteristics:  Non-productive Severity:  Moderate Duration:  6 days Timing:  Intermittent Associated symptoms: chills, fever and sinus congestion   Fever:    Duration:  3 days   Timing:  Intermittent Pt is a 37 year old female who presents with non-productive cough, congestion, body aches and decreased appetite that started approx 5-6 days ago. She also reports a history of fever and chills. She reports that she has had a nagging cough that has been hanging on for a few days and has significant body aches and fatigue. She denies nausea, vomiting or diarrhea. She denies known sick exposure or recent travel.   Past Medical History  Diagnosis Date  . Back pain   . Medical history non-contributory    Past Surgical History  Procedure Laterality Date  . Leep     Family History  Problem Relation Age of Onset  . Anesthesia problems Neg Hx   . Other Neg Hx    History  Substance Use Topics  . Smoking status: Current Every Day Smoker -- 1.00 packs/day    Types: Cigars  . Smokeless tobacco: Not on file  . Alcohol Use: Yes     Comment: occasionally   OB History   Grav Para Term Preterm Abortions TAB SAB Ect Mult Living   2    1  1   1      Review of Systems  Constitutional: Positive for fever and chills.  Respiratory: Positive for cough.   Gastrointestinal: Positive for diarrhea. Negative for nausea, vomiting and abdominal pain.  Genitourinary: Negative for dysuria.  All other systems reviewed and are negative.    Allergies  Review of patient's  allergies indicates no known allergies.  Home Medications   Current Outpatient Rx  Name  Route  Sig  Dispense  Refill  . acetaminophen (TYLENOL) 500 MG tablet   Oral   Take 1,000 mg by mouth every 6 (six) hours as needed for mild pain or moderate pain.          Marland Kitchen guaiFENesin 200 MG tablet   Oral   Take 400 mg by mouth every 4 (four) hours as needed for cough or to loosen phlegm.         Marland Kitchen Phenylephrine-DM-GG-APAP (TYLENOL COLD MULTI-SYMPTOM) 5-10-200-325 MG TABS   Oral   Take 2 tablets by mouth every 4 (four) hours as needed (cold).          . sodium-potassium bicarbonate (ALKA-SELTZER GOLD) TBEF dissolvable tablet   Oral   Take 1 tablet by mouth daily as needed (cold).          BP 120/80  Pulse 83  Temp(Src) 98.8 F (37.1 C) (Oral)  Resp 19  Ht 5\' 4"  (1.626 m)  Wt 130 lb (58.968 kg)  BMI 22.30 kg/m2  SpO2 98%  LMP 08/11/2013 Physical Exam  Vitals reviewed. Constitutional: She is oriented to person, place, and time. She appears well-developed and well-nourished. No distress.  HENT:  Head: Normocephalic and atraumatic.  Neck: Normal range of motion. Neck supple. No JVD present. No tracheal deviation present. No  thyromegaly present.  Cardiovascular: Normal rate, regular rhythm, normal heart sounds and intact distal pulses.   Pulmonary/Chest: Effort normal. No respiratory distress. She has wheezes.  Mild expiratory wheezes bilateral upper and middle fields.  Abdominal: Soft. Bowel sounds are normal. She exhibits no distension. There is no tenderness.  Musculoskeletal: Normal range of motion.  Neurological: She is alert and oriented to person, place, and time.  Skin: Skin is warm and dry.  Psychiatric: She has a normal mood and affect. Her behavior is normal. Judgment and thought content normal.    ED Course  Procedures (including critical care time) Labs Review Labs Reviewed - No data to display Imaging Review No results found.  EKG Interpretation    None       MDM   1. Influenza    Mild leukocytosis. Cough, congestion and generalized body aches with history of fever. Afebrile here tonight. Mild wheezing bilaterally on exam that cleared with neb tx x 1. Discussed plan with pt and she understands, return precautions given. Stable for discharge home. Prescription for Robitussin AC given.     Irish Elders, NP 09/06/13 [phone]

## 2013-09-07 NOTE — ED Provider Notes (Signed)
Medical screening examination/treatment/procedure(s) were performed by non-physician practitioner and as supervising physician I was immediately available for consultation/collaboration.  Hurman Horn, MD 09/07/13 [phone]

## 2013-09-08 ENCOUNTER — Encounter (HOSPITAL_COMMUNITY): Payer: Self-pay | Admitting: Emergency Medicine

## 2013-09-08 ENCOUNTER — Emergency Department (HOSPITAL_COMMUNITY)
Admission: EM | Admit: 2013-09-08 | Discharge: 2013-09-08 | Disposition: A | Discharge status: Home / Self Care | Payer: Medicaid Other | Attending: Emergency Medicine | Admitting: Emergency Medicine

## 2013-09-08 DIAGNOSIS — B349 Viral infection, unspecified: Secondary | ICD-10-CM

## 2013-09-08 DIAGNOSIS — R197 Diarrhea, unspecified: Secondary | ICD-10-CM | POA: Insufficient documentation

## 2013-09-08 DIAGNOSIS — B9789 Other viral agents as the cause of diseases classified elsewhere: Secondary | ICD-10-CM | POA: Insufficient documentation

## 2013-09-08 DIAGNOSIS — R519 Headache, unspecified: Secondary | ICD-10-CM

## 2013-09-08 DIAGNOSIS — F17200 Nicotine dependence, unspecified, uncomplicated: Secondary | ICD-10-CM | POA: Insufficient documentation

## 2013-09-08 DIAGNOSIS — K089 Disorder of teeth and supporting structures, unspecified: Secondary | ICD-10-CM | POA: Insufficient documentation

## 2013-09-08 DIAGNOSIS — IMO0001 Reserved for inherently not codable concepts without codable children: Secondary | ICD-10-CM | POA: Insufficient documentation

## 2013-09-08 DIAGNOSIS — R062 Wheezing: Secondary | ICD-10-CM | POA: Insufficient documentation

## 2013-09-08 DIAGNOSIS — J3489 Other specified disorders of nose and nasal sinuses: Secondary | ICD-10-CM | POA: Insufficient documentation

## 2013-09-08 DIAGNOSIS — R51 Headache: Secondary | ICD-10-CM | POA: Insufficient documentation

## 2013-09-08 DIAGNOSIS — R05 Cough: Secondary | ICD-10-CM | POA: Insufficient documentation

## 2013-09-08 DIAGNOSIS — R059 Cough, unspecified: Secondary | ICD-10-CM | POA: Insufficient documentation

## 2013-09-08 DIAGNOSIS — R52 Pain, unspecified: Secondary | ICD-10-CM | POA: Insufficient documentation

## 2013-09-08 DIAGNOSIS — R110 Nausea: Secondary | ICD-10-CM | POA: Insufficient documentation

## 2013-09-08 MED ORDER — HYDROCODONE-ACETAMINOPHEN 5-325 MG PO TABS: 1.0000 | ORAL_TABLET | ORAL | Status: DC | PRN

## 2013-09-08 MED ORDER — AMOXICILLIN 500 MG PO CAPS: 500.0000 mg | ORAL_CAPSULE | Freq: Three times a day (TID) | ORAL | Status: DC

## 2013-09-08 MED ORDER — ALBUTEROL SULFATE HFA 108 (90 BASE) MCG/ACT IN AERS
2.0000 | INHALATION_SPRAY | Freq: Once | RESPIRATORY_TRACT | Status: DC
  Filled 2013-09-08: qty 6.7

## 2013-09-08 MED ORDER — IBUPROFEN 800 MG PO TABS: 800.0000 mg | ORAL_TABLET | Freq: Three times a day (TID) | ORAL | Status: DC | PRN

## 2013-09-08 NOTE — Progress Notes (Signed)
[facility] provided pt with a list of primary care resources and [facility] information.  °

## 2013-09-08 NOTE — ED Notes (Signed)
Pt reports flu like symptoms was seen for same  3 days ago.

## 2013-09-08 NOTE — ED Provider Notes (Signed)
CSN: [id number]     Arrival date & time 09/08/13  1340 History  This chart was scribed for non-physician practitioner Trixie Dredge, PA-C working with Enid Skeens, MD by Valera Castle, ED scribe. This patient was seen in room [facility] and the patient's care was started at 2:53 PM.  No chief complaint on file.   The history is provided by the patient. No language interpreter was used.   HPI Comments: Samantha [name] is a 37 y.o. female who presents to the Emergency Department complaining of sudden, moderate, constant, left sided facial pain/dental pain, onset yesterday. She was seen here for her flu symptoms 3 days ago. She reports that coughing, blowing her nose, and eating exacerbates her facial pain. She reports she still has body aches, cough, productive of green/yellow sputum, and rhinorrhea, with gray discharge. She denies bloody discharge from her nose. She also reports diarrhea, nausea, but denies vomiting. She reports taking Robitussin and Tylenol, with some relief. She denies fever, and any other associated symptoms. She reports h/o wisdom tooth extraction, and states she still has numb/strange feeling in her mouth.   PCP - Default, Provider, MD  Past Medical History  Diagnosis Date  . Back pain   . Medical history non-contributory    Past Surgical History  Procedure Laterality Date  . Leep     Family History  Problem Relation Age of Onset  . Anesthesia problems Neg Hx   . Other Neg Hx    History  Substance Use Topics  . Smoking status: Current Every Day Smoker -- 1.00 packs/day    Types: Cigars  . Smokeless tobacco: Not on file  . Alcohol Use: Yes     Comment: occasionally   OB History   Grav Para Term Preterm Abortions TAB SAB Ect Mult Living   2    1  1   1      Review of Systems  Constitutional: Negative for fever.  HENT: Positive for dental problem (left sided pain) and rhinorrhea (gray discharge). Negative for facial swelling.   Respiratory: Positive for cough  (productive of green/yellow sputum).   Gastrointestinal: Positive for nausea and diarrhea. Negative for vomiting.  Musculoskeletal: Positive for myalgias (generalized).    Allergies  Review of patient's allergies indicates no known allergies.  Home Medications   Current Outpatient Rx  Name  Route  Sig  Dispense  Refill  . acetaminophen (TYLENOL) 500 MG tablet   Oral   Take 1,000 mg by mouth every 6 (six) hours as needed for mild pain or moderate pain.          Marland Kitchen guaifenesin (ROBITUSSIN) 100 MG/5ML syrup   Oral   Take 200 mg by mouth 3 (three) times daily as needed for cough.         Marland Kitchen guaiFENesin-codeine 100-10 MG/5ML syrup   Oral   Take 5 mLs by mouth 3 (three) times daily as needed for cough.   120 mL   0    BP 111/69  Pulse 73  Temp(Src) 98.5 F (36.9 C) (Oral)  Resp 16  SpO2 97%  LMP 08/11/2013  Physical Exam  Nursing note and vitals reviewed. Constitutional: She appears well-developed and well-nourished. No distress.  HENT:  Head: Normocephalic and atraumatic.  Nose: Rhinorrhea present. Right sinus exhibits no maxillary sinus tenderness and no frontal sinus tenderness. Left sinus exhibits maxillary sinus tenderness. Left sinus exhibits no frontal sinus tenderness.  Mouth/Throat: Uvula is midline and mucous membranes are normal. Posterior oropharyngeal erythema present.  No oropharyngeal exudate or posterior oropharyngeal edema.  No facial swelling. Left upper 3rd molar tender to percussion.   Neck: Neck supple.  Cardiovascular: Normal rate, regular rhythm and normal heart sounds.  Exam reveals no gallop and no friction rub.   No murmur heard. Pulmonary/Chest: Effort normal. No respiratory distress. She has wheezes. She has no rales.  Coughing upon exam.  Abdominal: Soft. She exhibits no distension. There is no tenderness. There is no rebound and no guarding.  Lymphadenopathy:    She has no cervical adenopathy.  Neurological: She is alert.  Skin: She is not  diaphoretic.    ED Course  Procedures (including critical care time)  DIAGNOSTIC STUDIES: Oxygen Saturation is 97% on room air, normal by my interpretation.    COORDINATION OF CARE: 3:00 PM-Discussed treatment plan which includes Amoxicillin, Hydrocodone, and Ibuprofen with pt at bedside and pt agreed to plan. Will give pt referral to dentist.  Labs Review Labs Reviewed - No data to display Imaging Review Dg Chest 2 View  09/06/2013   CLINICAL DATA:  Cough with generalized body aches.  EXAM: CHEST  2 VIEW  COMPARISON:  06/01/2013  FINDINGS: Metallic foreign bodies projected over the left chest are stable. The heart size and mediastinal contours are within normal limits. Both lungs are clear. The visualized skeletal structures are unremarkable.  IMPRESSION: No active cardiopulmonary disease.   Electronically Signed   By: Burman Nieves M.D.   On: 09/06/2013 23:07    EKG Interpretation   None       MDM   1. Viral syndrome   2. Left facial pain    Pt with viral illness x 9 days and now with left maxillary pain/tenderness pt thinks is related to a tooth.  The tooth is tender to percussion but does not look abnormal.  I suspect this is dental pain vs left maxillary sinusitis.  If sinusitis in the setting of flu/influenza-like illness it may be viral, however I will give antibiotics to cover both for sinusitis and dental infection.  Dental follow up.  D/C home with norco, ibuprofen, amoxicillin.  Discussed findings, treatment, and follow up  with patient.  Pt given return precautions.  Pt verbalizes understanding and agrees with plan.        I personally performed the services described in this documentation, which was scribed in my presence. The recorded information has been reviewed and is accurate.    Trixie Dredge, PA-C 09/08/13 [phone]

## 2013-09-10 NOTE — ED Provider Notes (Signed)
Medical screening examination/treatment/procedure(s) were performed by non-physician practitioner and as supervising physician I was immediately available for consultation/collaboration.  EKG Interpretation   None         Mariea Clonts, MD 09/10/13 2217

## 2014-07-12 ENCOUNTER — Encounter (HOSPITAL_COMMUNITY): Payer: Self-pay | Admitting: Emergency Medicine

## 2014-12-13 ENCOUNTER — Encounter (HOSPITAL_COMMUNITY): Payer: Self-pay | Admitting: Emergency Medicine

## 2014-12-13 ENCOUNTER — Emergency Department (INDEPENDENT_AMBULATORY_CARE_PROVIDER_SITE_OTHER)
Admission: EM | Admit: 2014-12-13 | Discharge: 2014-12-13 | Disposition: A | Discharge status: Home / Self Care | Payer: PRIVATE HEALTH INSURANCE | Source: Home / Self Care | Attending: Family Medicine | Admitting: Family Medicine

## 2014-12-13 DIAGNOSIS — S0083XA Contusion of other part of head, initial encounter: Secondary | ICD-10-CM
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
## 2014-12-13 NOTE — Discharge Instructions (Signed)
See your doctor for physical exam and addressing other issues.

## 2014-12-13 NOTE — ED Notes (Signed)
Patient reports she has a lump on her forehead for years. Recently she reports that she has had problems with memory loss and concentrating. Patient is in NAD.

## 2014-12-19 ENCOUNTER — Encounter (HOSPITAL_COMMUNITY): Payer: Self-pay | Admitting: Emergency Medicine

## 2014-12-19 ENCOUNTER — Emergency Department (HOSPITAL_COMMUNITY)
Admission: EM | Admit: 2014-12-19 | Discharge: 2014-12-19 | Disposition: A | Discharge status: Home / Self Care | Payer: PRIVATE HEALTH INSURANCE | Attending: Emergency Medicine | Admitting: Emergency Medicine

## 2014-12-19 DIAGNOSIS — Z720 Tobacco use: Secondary | ICD-10-CM | POA: Diagnosis not present

## 2014-12-19 DIAGNOSIS — I8390 Asymptomatic varicose veins of unspecified lower extremity: Secondary | ICD-10-CM | POA: Diagnosis not present

## 2014-12-19 DIAGNOSIS — Z792 Long term (current) use of antibiotics: Secondary | ICD-10-CM | POA: Diagnosis not present

## 2014-12-19 DIAGNOSIS — H6122 Impacted cerumen, left ear: Secondary | ICD-10-CM | POA: Diagnosis not present

## 2014-12-19 DIAGNOSIS — M7989 Other specified soft tissue disorders: Secondary | ICD-10-CM | POA: Diagnosis present

## 2014-12-19 MED ORDER — STOCKING APPLICATOR REGULAR MISC: 1.0000 | Freq: Every day | Status: DC

## 2014-12-19 MED ORDER — HYDROGEN PEROXIDE 3 % EX SOLN: CUTANEOUS | Status: DC | PRN

## 2014-12-19 NOTE — ED Notes (Addendum)
Pt c/o bilateral leg pain and swelling for several weeks, pt has visible veins in back of legs. sts swelling worse the in morning and throughout day they become painful. Pt also reports she was cleaning her ears today and now she can't hear out of L ear.
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
## 2014-12-19 NOTE — Discharge Instructions (Signed)
Cerumen Impaction A cerumen impaction is when the wax in your ear forms a plug. This plug usually causes reduced hearing. Sometimes it also causes an earache or dizziness. Removing a cerumen impaction can be difficult and painful. The wax sticks to the ear canal. The canal is sensitive and bleeds easily. If you try to remove a heavy wax buildup with a cotton tipped swab, you may push it in further. Irrigation with water, suction, and small ear curettes may be used to clear out the wax. If the impaction is fixed to the skin in the ear canal, ear drops may be needed for a few days to loosen the wax. People who build up a lot of wax frequently can use ear wax removal products available in your local drugstore. SEEK MEDICAL CARE IF:  You develop an earache, increased hearing loss, or marked dizziness. Document Released: 10/04/2004 Document Revised: 11/19/2011 Document Reviewed: 11/24/2009 [facility] Patient Information 2015 [place], [place]. This information is not intended to replace advice given to you by your health care provider. Make sure you discuss any questions you have with your health care provider.  Ear Drops You have been diagnosed with a condition requiring you to put drops of medicine into your outer ear. HOME CARE INSTRUCTIONS   Put drops in the affected ear as instructed. After putting the drops in, you will need to lie down with the affected ear facing up for ten minutes so the drops will remain in the ear canal and run down and fill the canal. Continue using the ear drops for as long as directed by your health care provider.  Prior to getting up, put a cotton ball gently in your ear canal. Leave enough of the cotton ball out so it can be easily removed. Do not attempt to push this down into the canal with a cotton-tipped swab or other instrument.  Do not irrigate or wash out your ears if you have had a perforated eardrum or mastoid surgery, or unless instructed to do so by your health care  provider.  Keep appointments with your health care provider as instructed.  Finish all medicine, or use for the length of time prescribed by your health care provider. Continue the drops even if your problem seems to be doing well after a couple days, or continue as instructed. SEEK MEDICAL CARE IF:  You become worse or develop increasing pain.  You notice any unusual drainage from your ear (particularly if the drainage has a bad smell).  You develop hearing difficulties.  You experience a serious form of dizziness in which you feel as if the room is spinning, and you feel nauseated (vertigo).  The outside of your ear becomes red or swollen or both. This may be a sign of an allergic reaction. MAKE SURE YOU:   Understand these instructions.  Will watch your condition.  Will get help right away if you are not doing well or get worse. Document Released: 08/21/2001 Document Revised: 09/01/2013 Document Reviewed: 03/24/2013 [facility] Patient Information 2015 [place], [place]. This information is not intended to replace advice given to you by your health care provider. Make sure you discuss any questions you have with your health care provider. Varicose Veins Varicose veins are veins that have become enlarged and twisted. CAUSES This condition is the result of valves in the veins not working properly. Valves in the veins help return blood from the leg to the heart. If these valves are damaged, blood flows backwards and backs up into  the veins in the leg near the skin. This causes the veins to become larger. People who are on their feet a lot, who are pregnant, or who are overweight are more likely to develop varicose veins. SYMPTOMS   Bulging, twisted-appearing, bluish veins, most commonly found on the legs.  Leg pain or a feeling of heaviness. These symptoms may be worse at the end of the day.  Leg swelling.  Skin color changes. DIAGNOSIS  Varicose veins can usually be diagnosed with  an exam of your legs by your caregiver. He or she may recommend an ultrasound of your leg veins. TREATMENT  Most varicose veins can be treated at home.However, other treatments are available for people who have persistent symptoms or who want to treat the cosmetic appearance of the varicose veins. These include:  Laser treatment of very small varicose veins.  Medicine that is shot (injected) into the vein. This medicine hardens the walls of the vein and closes off the vein. This treatment is called sclerotherapy. Afterwards, you may need to wear clothing or bandages that apply pressure.  Surgery. HOME CARE INSTRUCTIONS   Do not stand or sit in one position for long periods of time. Do not sit with your legs crossed. Rest with your legs raised during the day.  Wear elastic stockings or support hose. Do not wear other tight, encircling garments around the legs, pelvis, or waist.  Walk as much as possible to increase blood flow.  Raise the foot of your bed at night with 2-inch blocks.  If you get a cut in the skin over the vein and the vein bleeds, lie down with your leg raised and press on it with a clean cloth until the bleeding stops. Then place a bandage (dressing) on the cut. See your caregiver if it continues to bleed or needs stitches. SEEK MEDICAL CARE IF:   The skin around your ankle starts to break down.  You have pain, redness, tenderness, or hard swelling developing in your leg over a vein.  You are uncomfortable due to leg pain. Document Released: 06/06/2005 Document Revised: 11/19/2011 Document Reviewed: 10/23/2010 [facility] Patient Information 2015 [place], [place]. This information is not intended to replace advice given to you by your health care provider. Make sure you discuss any questions you have with your health care provider.

## 2014-12-28 ENCOUNTER — Encounter (HOSPITAL_COMMUNITY): Payer: Self-pay

## 2014-12-28 ENCOUNTER — Emergency Department (HOSPITAL_COMMUNITY): Payer: PRIVATE HEALTH INSURANCE

## 2014-12-28 ENCOUNTER — Emergency Department (HOSPITAL_COMMUNITY)
Admission: EM | Admit: 2014-12-28 | Discharge: 2014-12-29 | Disposition: A | Discharge status: Home / Self Care | Payer: PRIVATE HEALTH INSURANCE | Attending: Emergency Medicine | Admitting: Emergency Medicine

## 2014-12-28 DIAGNOSIS — Z720 Tobacco use: Secondary | ICD-10-CM | POA: Insufficient documentation

## 2014-12-28 DIAGNOSIS — B349 Viral infection, unspecified: Secondary | ICD-10-CM | POA: Insufficient documentation

## 2014-12-28 DIAGNOSIS — R69 Illness, unspecified: Secondary | ICD-10-CM

## 2014-12-28 DIAGNOSIS — Z791 Long term (current) use of non-steroidal anti-inflammatories (NSAID): Secondary | ICD-10-CM | POA: Insufficient documentation

## 2014-12-28 DIAGNOSIS — Z3202 Encounter for pregnancy test, result negative: Secondary | ICD-10-CM | POA: Diagnosis not present

## 2014-12-28 DIAGNOSIS — J111 Influenza due to unidentified influenza virus with other respiratory manifestations: Secondary | ICD-10-CM | POA: Diagnosis not present

## 2014-12-28 DIAGNOSIS — R52 Pain, unspecified: Secondary | ICD-10-CM | POA: Diagnosis present

## 2014-12-28 LAB — CBC WITH DIFFERENTIAL/PLATELET
BASOS ABS: 0 10*3/uL (ref 0.0–0.1)
BASOS PCT: 1 % (ref 0–1)
Eosinophils Absolute: 0 10*3/uL (ref 0.0–0.7)
Eosinophils Relative: 1 % (ref 0–5)
HEMATOCRIT: 43.3 % (ref 36.0–46.0)
Hemoglobin: 14.9 g/dL (ref 12.0–15.0)
LYMPHS PCT: 24 % (ref 12–46)
Lymphs Abs: 1 10*3/uL (ref 0.7–4.0)
MCH: 31 pg (ref 26.0–34.0)
MCHC: 34.4 g/dL (ref 30.0–36.0)
MCV: 90.2 fL (ref 78.0–100.0)
Monocytes Absolute: 0.9 10*3/uL (ref 0.1–1.0)
Monocytes Relative: 21 % — ABNORMAL HIGH (ref 3–12)
NEUTROS ABS: 2.3 10*3/uL (ref 1.7–7.7)
NEUTROS PCT: 53 % (ref 43–77)
Platelets: 204 10*3/uL (ref 150–400)
RBC: 4.8 MIL/uL (ref 3.87–5.11)
RDW: 12.7 % (ref 11.5–15.5)
WBC: 4.3 10*3/uL (ref 4.0–10.5)

## 2014-12-28 LAB — COMPREHENSIVE METABOLIC PANEL
ALBUMIN: 4.1 g/dL (ref 3.5–5.2)
ALT: 9 U/L (ref 0–35)
AST: 20 U/L (ref 0–37)
Alkaline Phosphatase: 78 U/L (ref 39–117)
Anion gap: 13 (ref 5–15)
BILIRUBIN TOTAL: 0.7 mg/dL (ref 0.3–1.2)
BUN: 7 mg/dL (ref 6–23)
CALCIUM: 9.1 mg/dL (ref 8.4–10.5)
CHLORIDE: 102 mmol/L (ref 96–112)
CO2: 21 mmol/L (ref 19–32)
Creatinine, Ser: 0.91 mg/dL (ref 0.50–1.10)
GFR calc Af Amer: >90 mL/min (ref >90)
GFR, EST NON AFRICAN AMERICAN: 79 mL/min — AB (ref >90)
Glucose, Bld: 137 mg/dL — ABNORMAL HIGH (ref 70–99)
Potassium: 3.5 mmol/L (ref 3.5–5.1)
SODIUM: 136 mmol/L (ref 135–145)
Total Protein: 8.1 g/dL (ref 6.0–8.3)

## 2014-12-28 LAB — I-STAT CG4 LACTIC ACID, ED: Lactic Acid, Venous: 1.14 mmol/L (ref 0.5–2.0)

## 2014-12-28 MED ORDER — IBUPROFEN 800 MG PO TABS
800.0000 mg | ORAL_TABLET | Freq: Once | ORAL | Status: AC
  Administered 2014-12-28: 800 mg via ORAL
  Filled 2014-12-28: qty 1

## 2014-12-28 MED ORDER — ACETAMINOPHEN 325 MG PO TABS
ORAL_TABLET | ORAL | Status: AC
  Filled 2014-12-28: qty 2

## 2014-12-28 MED ORDER — IPRATROPIUM-ALBUTEROL 0.5-2.5 (3) MG/3ML IN SOLN
3.0000 mL | Freq: Once | RESPIRATORY_TRACT | Status: AC
  Administered 2014-12-28: 3 mL via RESPIRATORY_TRACT
  Filled 2014-12-28: qty 3

## 2014-12-28 MED ORDER — ACETAMINOPHEN 325 MG PO TABS
650.0000 mg | ORAL_TABLET | Freq: Once | ORAL | Status: AC
  Administered 2014-12-28: 650 mg via ORAL

## 2014-12-28 MED ORDER — HYDROCODONE-HOMATROPINE 5-1.5 MG/5ML PO SYRP
5.0000 mL | ORAL_SOLUTION | Freq: Once | ORAL | Status: AC
  Administered 2014-12-28: 5 mL via ORAL
  Filled 2014-12-28: qty 5
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
## 2014-12-28 NOTE — ED Notes (Signed)
Pt here for body aches, fever today 100.5, not feeling well, vomitng.

## 2014-12-28 NOTE — ED Notes (Signed)
Pt also reporting cough

## 2014-12-28 NOTE — ED Notes (Signed)
MD in room

## 2014-12-29 LAB — PREGNANCY, URINE: Preg Test, Ur: NEGATIVE

## 2014-12-29 LAB — URINALYSIS, ROUTINE W REFLEX MICROSCOPIC
Glucose, UA: NEGATIVE mg/dL
Ketones, ur: 15 mg/dL — AB
Leukocytes, UA: NEGATIVE
Nitrite: NEGATIVE
PROTEIN: 30 mg/dL — AB
SPECIFIC GRAVITY, URINE: 1.028 (ref 1.005–1.030)
Urobilinogen, UA: 1 mg/dL (ref 0.0–1.0)
pH: 5.5 (ref 5.0–8.0)

## 2014-12-29 LAB — URINE MICROSCOPIC-ADD ON

## 2014-12-29 MED ORDER — IBUPROFEN 800 MG PO TABS: 800.0000 mg | ORAL_TABLET | Freq: Three times a day (TID) | ORAL | Status: DC

## 2014-12-29 MED ORDER — HYDROCODONE-HOMATROPINE 5-1.5 MG/5ML PO SYRP: 10.0000 mL | ORAL_SOLUTION | Freq: Four times a day (QID) | ORAL | Status: DC | PRN

## 2014-12-29 NOTE — Discharge Instructions (Signed)
Suspected Influenza Influenza ("the flu") is a viral infection of the respiratory tract. It occurs more often in winter months because people spend more time in close contact with one another. Influenza can make you feel very sick. Influenza easily spreads from person to person (contagious). CAUSES  Influenza is caused by a virus that infects the respiratory tract. You can catch the virus by breathing in droplets from an infected person's cough or sneeze. You can also catch the virus by touching something that was recently contaminated with the virus and then touching your mouth, nose, or eyes. RISKS AND COMPLICATIONS You may be at risk for a more severe case of influenza if you smoke cigarettes, have diabetes, have chronic heart disease (such as heart failure) or lung disease (such as asthma), or if you have a weakened immune system. Elderly people and pregnant women are also at risk for more serious infections. The most common problem of influenza is a lung infection (pneumonia). Sometimes, this problem can require emergency medical care and may be life threatening. SIGNS AND SYMPTOMS  Symptoms typically last 4 to 10 days and may include:  Fever.  Chills.  Headache, body aches, and muscle aches.  Sore throat.  Chest discomfort and cough.  Poor appetite.  Weakness or feeling tired.  Dizziness.  Nausea or vomiting. DIAGNOSIS  Diagnosis of influenza is often made based on your history and a physical exam. A nose or throat swab test can be done to confirm the diagnosis. TREATMENT  In mild cases, influenza goes away on its own. Treatment is directed at relieving symptoms. For more severe cases, your health care provider may prescribe antiviral medicines to shorten the sickness. Antibiotic medicines are not effective because the infection is caused by a virus, not by bacteria. HOME CARE INSTRUCTIONS  Take medicines only as directed by your health care provider.  Use a cool mist  humidifier to make breathing easier.  Get plenty of rest until your temperature returns to normal. This usually takes 3 to 4 days.  Drink enough fluid to keep your urine clear or pale yellow.  Cover yourmouth and nosewhen coughing or sneezing,and wash your handswellto prevent thevirusfrom spreading.  Stay homefromwork orschool untilthe fever is gonefor at least 19full day. PREVENTION  An annual influenza vaccination (flu shot) is the best way to avoid getting influenza. An annual flu shot is now routinely recommended for all adults in the [place] IF:  You experiencechest pain, yourcough worsens,or you producemore mucus.  Youhave nausea,vomiting, ordiarrhea.  Your fever returns or gets worse. SEEK IMMEDIATE MEDICAL CARE IF:  You havetrouble breathing, you become short of breath,or your skin ornails becomebluish.  You have severe painor stiffnessin the neck.  You develop a sudden headache, or pain in the face or ear.  You have nausea or vomiting that you cannot control. MAKE SURE YOU:   Understand these instructions.  Will watch your condition.  Will get help right away if you are not doing well or get worse. Document Released: 08/24/2000 Document Revised: 01/11/2014 Document Reviewed: 11/26/2011 [facility] Patient Information 2015 [place], [place]. This information is not intended to replace advice given to you by your health care provider. Make sure you discuss any questions you have with your health care provider.

## 2014-12-29 NOTE — ED Notes (Signed)
Pt verbalized understanding of d/c instructions and has no further questions.  

## 2014-12-30 LAB — URINE CULTURE: Colony Count: 50000

## 2015-01-01 ENCOUNTER — Encounter (HOSPITAL_COMMUNITY): Payer: Self-pay | Admitting: Emergency Medicine

## 2015-01-01 DIAGNOSIS — R05 Cough: Secondary | ICD-10-CM | POA: Insufficient documentation

## 2015-01-01 DIAGNOSIS — Z79899 Other long term (current) drug therapy: Secondary | ICD-10-CM | POA: Insufficient documentation

## 2015-01-01 DIAGNOSIS — Z87891 Personal history of nicotine dependence: Secondary | ICD-10-CM | POA: Insufficient documentation

## 2015-01-01 DIAGNOSIS — R062 Wheezing: Secondary | ICD-10-CM | POA: Insufficient documentation

## 2015-01-01 DIAGNOSIS — R112 Nausea with vomiting, unspecified: Secondary | ICD-10-CM | POA: Insufficient documentation

## 2015-01-01 DIAGNOSIS — Z3202 Encounter for pregnancy test, result negative: Secondary | ICD-10-CM | POA: Diagnosis not present

## 2015-01-01 LAB — POC URINE PREG, ED: PREG TEST UR: NEGATIVE

## 2015-01-01 LAB — CBC WITH DIFFERENTIAL/PLATELET
Basophils Absolute: 0.1 10*3/uL (ref 0.0–0.1)
Basophils Relative: 1 % (ref 0–1)
Eosinophils Absolute: 0.1 10*3/uL (ref 0.0–0.7)
Eosinophils Relative: 1 % (ref 0–5)
HCT: 44.2 % (ref 36.0–46.0)
Hemoglobin: 15.1 g/dL — ABNORMAL HIGH (ref 12.0–15.0)
LYMPHS ABS: 3.6 10*3/uL (ref 0.7–4.0)
Lymphocytes Relative: 47 % — ABNORMAL HIGH (ref 12–46)
MCH: 30.3 pg (ref 26.0–34.0)
MCHC: 34.2 g/dL (ref 30.0–36.0)
MCV: 88.8 fL (ref 78.0–100.0)
MONOS PCT: 10 % (ref 3–12)
Monocytes Absolute: 0.7 10*3/uL (ref 0.1–1.0)
Neutro Abs: 3.1 10*3/uL (ref 1.7–7.7)
Neutrophils Relative %: 41 % — ABNORMAL LOW (ref 43–77)
Platelets: 178 10*3/uL (ref 150–400)
RBC: 4.98 MIL/uL (ref 3.87–5.11)
RDW: 12.4 % (ref 11.5–15.5)
WBC: 7.6 10*3/uL (ref 4.0–10.5)

## 2015-01-01 NOTE — ED Notes (Signed)
Patient here with complaint of vomiting and inability to tolerate PO intake for 6 days. States was recently seen for flu like illness on "tues or wed".

## 2015-01-02 ENCOUNTER — Emergency Department (HOSPITAL_COMMUNITY)
Admission: EM | Admit: 2015-01-02 | Discharge: 2015-01-02 | Disposition: A | Discharge status: Home / Self Care | Payer: PRIVATE HEALTH INSURANCE | Attending: Emergency Medicine | Admitting: Emergency Medicine

## 2015-01-02 DIAGNOSIS — R112 Nausea with vomiting, unspecified: Secondary | ICD-10-CM

## 2015-01-02 DIAGNOSIS — R05 Cough: Secondary | ICD-10-CM

## 2015-01-02 DIAGNOSIS — R059 Cough, unspecified: Secondary | ICD-10-CM

## 2015-01-02 LAB — URINE MICROSCOPIC-ADD ON

## 2015-01-02 LAB — COMPREHENSIVE METABOLIC PANEL
ALK PHOS: 66 U/L (ref 39–117)
ALT: 7 U/L (ref 0–35)
AST: 21 U/L (ref 0–37)
Albumin: 3.7 g/dL (ref 3.5–5.2)
Anion gap: 10 (ref 5–15)
BUN: 7 mg/dL (ref 6–23)
CO2: 26 mmol/L (ref 19–32)
CREATININE: 0.78 mg/dL (ref 0.50–1.10)
Calcium: 9.2 mg/dL (ref 8.4–10.5)
Chloride: 99 mmol/L (ref 96–112)
GFR calc non Af Amer: >90 mL/min (ref >90)
GLUCOSE: 118 mg/dL — AB (ref 70–99)
POTASSIUM: 3.2 mmol/L — AB (ref 3.5–5.1)
SODIUM: 135 mmol/L (ref 135–145)
TOTAL PROTEIN: 7.8 g/dL (ref 6.0–8.3)
Total Bilirubin: 0.5 mg/dL (ref 0.3–1.2)

## 2015-01-02 LAB — URINALYSIS, ROUTINE W REFLEX MICROSCOPIC
Glucose, UA: NEGATIVE mg/dL
Ketones, ur: 15 mg/dL — AB
Leukocytes, UA: NEGATIVE
Nitrite: NEGATIVE
Protein, ur: 30 mg/dL — AB
SPECIFIC GRAVITY, URINE: 1.025 (ref 1.005–1.030)
Urobilinogen, UA: 0.2 mg/dL (ref 0.0–1.0)
pH: 5.5 (ref 5.0–8.0)

## 2015-01-02 LAB — RAPID URINE DRUG SCREEN, HOSP PERFORMED
Amphetamines: NOT DETECTED
BARBITURATES: NOT DETECTED
BENZODIAZEPINES: NOT DETECTED
Cocaine: NOT DETECTED
Opiates: POSITIVE — AB
Tetrahydrocannabinol: POSITIVE — AB

## 2015-01-02 MED ORDER — BENZONATATE 100 MG PO CAPS: 200.0000 mg | ORAL_CAPSULE | Freq: Three times a day (TID) | ORAL | Status: DC | PRN

## 2015-01-02 MED ORDER — ONDANSETRON HCL 4 MG/2ML IJ SOLN
4.0000 mg | Freq: Once | INTRAMUSCULAR | Status: AC
  Administered 2015-01-02: 4 mg via INTRAVENOUS
  Filled 2015-01-02: qty 2

## 2015-01-02 MED ORDER — SODIUM CHLORIDE 0.9 % IV SOLN
1000.0000 mL | INTRAVENOUS | Status: DC
  Administered 2015-01-02: 1000 mL via INTRAVENOUS

## 2015-01-02 MED ORDER — ALBUTEROL SULFATE HFA 108 (90 BASE) MCG/ACT IN AERS: 1.0000 | INHALATION_SPRAY | Freq: Four times a day (QID) | RESPIRATORY_TRACT | Status: DC | PRN

## 2015-01-02 MED ORDER — SODIUM CHLORIDE 0.9 % IV SOLN
1000.0000 mL | Freq: Once | INTRAVENOUS | Status: AC
  Administered 2015-01-02: 1000 mL via INTRAVENOUS

## 2015-01-02 MED ORDER — ALBUTEROL SULFATE HFA 108 (90 BASE) MCG/ACT IN AERS: 1.0000 | INHALATION_SPRAY | RESPIRATORY_TRACT | Status: DC | PRN

## 2015-01-02 MED ORDER — ONDANSETRON 8 MG PO TBDP: 8.0000 mg | ORAL_TABLET | Freq: Three times a day (TID) | ORAL | Status: DC | PRN

## 2015-01-02 MED ORDER — IPRATROPIUM-ALBUTEROL 0.5-2.5 (3) MG/3ML IN SOLN
3.0000 mL | Freq: Once | RESPIRATORY_TRACT | Status: AC
  Administered 2015-01-02: 3 mL via RESPIRATORY_TRACT
  Filled 2015-01-02: qty 3

## 2015-01-02 NOTE — ED Provider Notes (Signed)
CSN: [id number]     Arrival date & time 01/01/15  2254 History  This chart was scribed for Linton Flemings, MD by Eustaquio Maize, ED Scribe. This patient was seen in room A01C/A01C and the patient's care was started at 12:28 AM.     Chief Complaint  Patient presents with  . Emesis   The history is provided by the patient. No language interpreter was used.     HPI Comments: Samantha [name] is a 39 y.o. female who presents to the Emergency Department complaining of vomiting that began approximately 2 days ago. Pt was seen in the ED on 4/19 (4 days ago) for generalized body aches, vomiting, and cough. Pt has been using Hycodan cough syrup with no relief. She denies fever, diarrhea, or any other symptoms. Pt also denies recent sick contact with similar symptoms.    Past Medical History  Diagnosis Date  . Back pain   . Medical history non-contributory    Past Surgical History  Procedure Laterality Date  . Leep     Family History  Problem Relation Age of Onset  . Anesthesia problems Neg Hx   . Other Neg Hx    History  Substance Use Topics  . Smoking status: Former Smoker -- 1.00 packs/day    Types: Cigars    Quit date: 12/25/2014  . Smokeless tobacco: Not on file  . Alcohol Use: Yes     Comment: occasionally   OB History    Gravida Para Term Preterm AB TAB SAB Ectopic Multiple Living   2    1  1   1      Review of Systems  Constitutional: Negative for fever.  HENT: Negative for rhinorrhea.   Respiratory: Positive for cough.   Cardiovascular: Negative for chest pain.  Gastrointestinal: Positive for nausea and vomiting. Negative for diarrhea.  Musculoskeletal: Negative for gait problem.  Neurological: Negative for speech difficulty.  Psychiatric/Behavioral: Negative for confusion.      Allergies  Review of patient's allergies indicates no known allergies.  Home Medications   Prior to Admission medications   Medication Sig Start Date End Date Taking? Authorizing Provider   acetaminophen (TYLENOL) 500 MG tablet Take 1,000 mg by mouth every 6 (six) hours as needed for mild pain or moderate pain.     Historical Provider, MD  amoxicillin (AMOXIL) 500 MG capsule Take 1 capsule (500 mg total) by mouth 3 (three) times daily. Patient not taking: Reported on 12/28/2014 09/08/13   Clayton Bibles, PA-C  guaiFENesin-codeine 100-10 MG/5ML syrup Take 5 mLs by mouth 3 (three) times daily as needed for cough. Patient not taking: Reported on 12/28/2014 09/06/13   Elisha Headland, NP  HYDROcodone-acetaminophen (NORCO/VICODIN) 5-325 MG per tablet Take 1 tablet by mouth every 4 (four) hours as needed for moderate pain or severe pain. Patient not taking: Reported on 12/28/2014 09/08/13   Clayton Bibles, PA-C  HYDROcodone-homatropine [facility]) 5-1.5 MG/5ML syrup Take 10 mLs by mouth every 6 (six) hours as needed for cough. 12/29/14   Charlesetta Shanks, MD  hydrogen peroxide ([facility] HYDROGEN PEROXIDE) 3 % external solution Apply topically as needed. Apply to left ear daily, leave in ear, then allow to flush. Patient not taking: Reported on 12/28/2014 12/19/14   Doy Hutching, MD  ibuprofen (ADVIL,MOTRIN) 800 MG tablet Take 1 tablet (800 mg total) by mouth every 8 (eight) hours as needed for fever, mild pain or moderate pain. Patient not taking: Reported on 12/28/2014 09/08/13   Clayton Bibles, PA-C  ibuprofen (ADVIL,MOTRIN) 800  MG tablet Take 1 tablet (800 mg total) by mouth 3 (three) times daily. 12/29/14   Charlesetta Shanks, MD  Misc. Devices (STOCKING APPLICATOR REGULAR) MISC 1 Device by Does not apply route daily. Patient not taking: Reported on 12/28/2014 12/19/14   Doy Hutching, MD   Triage Vitals: BP 113/80 mmHg  Pulse 113  Temp(Src) 98.7 F (37.1 C) (Oral)  Resp 24  Ht 5\' 4"  (1.626 m)  Wt 134 lb 9 oz (61.037 kg)  BMI 23.09 kg/m2  SpO2 95%   Physical Exam  Constitutional: She is oriented to person, place, and time. She appears well-developed and well-nourished.  HENT:  Head: Normocephalic and  atraumatic.  Nose: Nose normal.  Mouth/Throat: Oropharynx is clear and moist.  Eyes: Conjunctivae and EOM are normal. Pupils are equal, round, and reactive to light.  Neck: Normal range of motion. Neck supple. No JVD present. No tracheal deviation present. No thyromegaly present.  Cardiovascular: Normal rate, regular rhythm, normal heart sounds and intact distal pulses.  Exam reveals no gallop and no friction rub.   No murmur heard. Pulmonary/Chest: Effort normal. No stridor. No respiratory distress. She has wheezes. She has no rales. She exhibits no tenderness.  Abdominal: Soft. Bowel sounds are normal. She exhibits no distension and no mass. There is no tenderness. There is no rebound and no guarding.  Musculoskeletal: Normal range of motion. She exhibits no edema or tenderness.  Lymphadenopathy:    She has no cervical adenopathy.  Neurological: She is alert and oriented to person, place, and time. She displays normal reflexes. She exhibits normal muscle tone. Coordination normal.  Skin: Skin is warm and dry. No rash noted. No erythema. No pallor.  Psychiatric: She has a normal mood and affect. Her behavior is normal. Judgment and thought content normal.  Nursing note and vitals reviewed.   ED Course  Procedures (including critical care time)  DIAGNOSTIC STUDIES: Oxygen Saturation is 95% on RA, normal by my interpretation.    COORDINATION OF CARE: 12:32 AM-Discussed treatment plan which includes breathing treatment and albuterol inhaler with pt at bedside and pt agreed to plan.   Labs Review Labs Reviewed  CBC WITH DIFFERENTIAL/PLATELET - Abnormal; Notable for the following:    Hemoglobin 15.1 (*)    Neutrophils Relative % 41 (*)    Lymphocytes Relative 47 (*)    All other components within normal limits  COMPREHENSIVE METABOLIC PANEL - Abnormal; Notable for the following:    Potassium 3.2 (*)    Glucose, Bld 118 (*)    All other components within normal limits  URINALYSIS,  ROUTINE W REFLEX MICROSCOPIC - Abnormal; Notable for the following:    Color, Urine AMBER (*)    APPearance CLOUDY (*)    Hgb urine dipstick SMALL (*)    Bilirubin Urine SMALL (*)    Ketones, ur 15 (*)    Protein, ur 30 (*)    All other components within normal limits  URINE MICROSCOPIC-ADD ON - Abnormal; Notable for the following:    Squamous Epithelial / LPF MANY (*)    All other components within normal limits  URINE RAPID DRUG SCREEN (HOSP PERFORMED) - Abnormal; Notable for the following:    Opiates POSITIVE (*)    Tetrahydrocannabinol POSITIVE (*)    All other components within normal limits  POC URINE PREG, ED    Imaging Review No results found.   EKG Interpretation None      MDM   Final diagnoses:  Nausea and vomiting, vomiting of  unspecified type  Cough    I personally performed the services described in this documentation, which was scribed in my presence. The recorded information has been reviewed and is accurate.  39 year old female with nausea and vomiting.  She reports for 6 days, was seen in the emergency department 3 days ago without vomiting.  At that time.  She reports she has had persistent cough.  Symptoms started soon after starting Hycodan.  Patient has received IV fluids and Zofran here is feeling better.  Wheezing on exam resolved with DuoNeb.  Will discharge home with albuterol inhaler.       Linton Flemings, MD 01/02/15 [phone]

## 2015-01-02 NOTE — Discharge Instructions (Signed)
Nausea and Vomiting Nausea is a sick feeling that often comes before throwing up (vomiting). Vomiting is a reflex where stomach contents come out of your mouth. Vomiting can cause severe loss of body fluids (dehydration). Children and elderly adults can become dehydrated quickly, especially if they also have diarrhea. Nausea and vomiting are symptoms of a condition or disease. It is important to find the cause of your symptoms. CAUSES   Direct irritation of the stomach lining. This irritation can result from increased acid production (gastroesophageal reflux disease), infection, food poisoning, taking certain medicines (such as nonsteroidal anti-inflammatory drugs), alcohol use, or tobacco use.  Signals from the brain.These signals could be caused by a headache, heat exposure, an inner ear disturbance, increased pressure in the brain from injury, infection, a tumor, or a concussion, pain, emotional stimulus, or metabolic problems.  An obstruction in the gastrointestinal tract (bowel obstruction).  Illnesses such as diabetes, hepatitis, gallbladder problems, appendicitis, kidney problems, cancer, sepsis, atypical symptoms of a heart attack, or eating disorders.  Medical treatments such as chemotherapy and radiation.  Receiving medicine that makes you sleep (general anesthetic) during surgery. DIAGNOSIS Your caregiver may ask for tests to be done if the problems do not improve after a few days. Tests may also be done if symptoms are severe or if the reason for the nausea and vomiting is not clear. Tests may include:  Urine tests.  Blood tests.  Stool tests.  Cultures (to look for evidence of infection).  X-rays or other imaging studies. Test results can help your caregiver make decisions about treatment or the need for additional tests. TREATMENT You need to stay well hydrated. Drink frequently but in small amounts.You may wish to drink water, sports drinks, clear broth, or eat frozen  ice pops or gelatin dessert to help stay hydrated.When you eat, eating slowly may help prevent nausea.There are also some antinausea medicines that may help prevent nausea. HOME CARE INSTRUCTIONS   Take all medicine as directed by your caregiver.  If you do not have an appetite, do not force yourself to eat. However, you must continue to drink fluids.  If you have an appetite, eat a normal diet unless your caregiver tells you differently.  Eat a variety of complex carbohydrates (rice, wheat, potatoes, bread), lean meats, yogurt, fruits, and vegetables.  Avoid high-fat foods because they are more difficult to digest.  Drink enough water and fluids to keep your urine clear or pale yellow.  If you are dehydrated, ask your caregiver for specific rehydration instructions. Signs of dehydration may include:  Severe thirst.  Dry lips and mouth.  Dizziness.  Dark urine.  Decreasing urine frequency and amount.  Confusion.  Rapid breathing or pulse. SEEK IMMEDIATE MEDICAL CARE IF:   You have blood or brown flecks (like coffee grounds) in your vomit.  You have black or bloody stools.  You have a severe headache or stiff neck.  You are confused.  You have severe abdominal pain.  You have chest pain or trouble breathing.  You do not urinate at least once every 8 hours.  You develop cold or clammy skin.  You continue to vomit for longer than 24 to 48 hours.  You have a fever. MAKE SURE YOU:   Understand these instructions.  Will watch your condition.  Will get help right away if you are not doing well or get worse. Document Released: 08/27/2005 Document Revised: 11/19/2011 Document Reviewed: 01/24/2011 [employer] Patient Information 2015 [employer]. This information is not intended   to replace advice given to you by your health care provider. Make sure you discuss any questions you have with your health care provider.  Cough, Adult  A cough is a reflex that helps  clear your throat and airways. It can help heal the body or may be a reaction to an irritated airway. A cough may only last 2 or 3 weeks (acute) or may last more than 8 weeks (chronic).  CAUSES Acute cough:  Viral or bacterial infections. Chronic cough:  Infections.  Allergies.  Asthma.  Post-nasal drip.  Smoking.  Heartburn or acid reflux.  Some medicines.  Chronic lung problems (COPD).  Cancer. SYMPTOMS   Cough.  Fever.  Chest pain.  Increased breathing rate.  High-pitched whistling sound when breathing (wheezing).  Colored mucus that you cough up (sputum). TREATMENT   A bacterial cough may be treated with antibiotic medicine.  A viral cough must run its course and will not respond to antibiotics.  Your caregiver may recommend other treatments if you have a chronic cough. HOME CARE INSTRUCTIONS   Only take over-the-counter or prescription medicines for pain, discomfort, or fever as directed by your caregiver. Use cough suppressants only as directed by your caregiver.  Use a cold steam vaporizer or humidifier in your bedroom or home to help loosen secretions.  Sleep in a semi-upright position if your cough is worse at night.  Rest as needed.  Stop smoking if you smoke. SEEK IMMEDIATE MEDICAL CARE IF:   You have pus in your sputum.  Your cough starts to worsen.  You cannot control your cough with suppressants and are losing sleep.  You begin coughing up blood.  You have difficulty breathing.  You develop pain which is getting worse or is uncontrolled with medicine.  You have a fever. MAKE SURE YOU:   Understand these instructions.  Will watch your condition.  Will get help right away if you are not doing well or get worse. Document Released: 02/23/2011 Document Revised: 11/19/2011 Document Reviewed: 02/23/2011 [facility] Patient Information 2015 [place], [place]. This information is not intended to replace advice given to you by your health  care provider. Make sure you discuss any questions you have with your health care provider.

## 2015-04-11 DIAGNOSIS — G459 Transient cerebral ischemic attack, unspecified: Secondary | ICD-10-CM

## 2015-04-11 HISTORY — DX: Transient cerebral ischemic attack, unspecified: G45.9

## 2015-04-21 ENCOUNTER — Observation Stay (HOSPITAL_COMMUNITY): Payer: PRIVATE HEALTH INSURANCE

## 2015-04-21 ENCOUNTER — Encounter (HOSPITAL_COMMUNITY): Payer: Self-pay | Admitting: Emergency Medicine

## 2015-04-21 ENCOUNTER — Emergency Department (HOSPITAL_COMMUNITY): Payer: PRIVATE HEALTH INSURANCE

## 2015-04-21 ENCOUNTER — Observation Stay (HOSPITAL_COMMUNITY)
Admission: EM | Admit: 2015-04-21 | Discharge: 2015-04-22 | Disposition: A | Discharge status: Home / Self Care | Payer: PRIVATE HEALTH INSURANCE | Attending: Internal Medicine | Admitting: Internal Medicine

## 2015-04-21 DIAGNOSIS — R200 Anesthesia of skin: Secondary | ICD-10-CM | POA: Insufficient documentation

## 2015-04-21 DIAGNOSIS — E785 Hyperlipidemia, unspecified: Secondary | ICD-10-CM | POA: Diagnosis not present

## 2015-04-21 DIAGNOSIS — R202 Paresthesia of skin: Secondary | ICD-10-CM

## 2015-04-21 DIAGNOSIS — J45909 Unspecified asthma, uncomplicated: Secondary | ICD-10-CM | POA: Diagnosis not present

## 2015-04-21 DIAGNOSIS — F17290 Nicotine dependence, other tobacco product, uncomplicated: Secondary | ICD-10-CM | POA: Diagnosis not present

## 2015-04-21 DIAGNOSIS — R471 Dysarthria and anarthria: Secondary | ICD-10-CM | POA: Diagnosis not present

## 2015-04-21 DIAGNOSIS — F17210 Nicotine dependence, cigarettes, uncomplicated: Secondary | ICD-10-CM | POA: Diagnosis not present

## 2015-04-21 DIAGNOSIS — R4701 Aphasia: Secondary | ICD-10-CM | POA: Diagnosis not present

## 2015-04-21 DIAGNOSIS — G459 Transient cerebral ischemic attack, unspecified: Principal | ICD-10-CM

## 2015-04-21 DIAGNOSIS — E236 Other disorders of pituitary gland: Secondary | ICD-10-CM | POA: Diagnosis not present

## 2015-04-21 DIAGNOSIS — R0989 Other specified symptoms and signs involving the circulatory and respiratory systems: Secondary | ICD-10-CM

## 2015-04-21 DIAGNOSIS — H538 Other visual disturbances: Secondary | ICD-10-CM | POA: Diagnosis not present

## 2015-04-21 DIAGNOSIS — R51 Headache: Secondary | ICD-10-CM | POA: Diagnosis not present

## 2015-04-21 LAB — CBC WITH DIFFERENTIAL/PLATELET
BASOS PCT: 1 % (ref 0–1)
Basophils Absolute: 0.1 10*3/uL (ref 0.0–0.1)
Eosinophils Absolute: 0.1 10*3/uL (ref 0.0–0.7)
Eosinophils Relative: 2 % (ref 0–5)
HCT: 43.4 % (ref 36.0–46.0)
Hemoglobin: 14.5 g/dL (ref 12.0–15.0)
LYMPHS ABS: 3.6 10*3/uL (ref 0.7–4.0)
Lymphocytes Relative: 48 % — ABNORMAL HIGH (ref 12–46)
MCH: 30.6 pg (ref 26.0–34.0)
MCHC: 33.4 g/dL (ref 30.0–36.0)
MCV: 91.6 fL (ref 78.0–100.0)
Monocytes Absolute: 0.5 10*3/uL (ref 0.1–1.0)
Monocytes Relative: 7 % (ref 3–12)
NEUTROS PCT: 44 % (ref 43–77)
Neutro Abs: 3.4 10*3/uL (ref 1.7–7.7)
PLATELETS: 214 10*3/uL (ref 150–400)
RBC: 4.74 MIL/uL (ref 3.87–5.11)
RDW: 12.6 % (ref 11.5–15.5)
WBC: 7.7 10*3/uL (ref 4.0–10.5)

## 2015-04-21 LAB — URINE MICROSCOPIC-ADD ON

## 2015-04-21 LAB — URINALYSIS, ROUTINE W REFLEX MICROSCOPIC
Bilirubin Urine: NEGATIVE
GLUCOSE, UA: NEGATIVE mg/dL
Ketones, ur: NEGATIVE mg/dL
Leukocytes, UA: NEGATIVE
NITRITE: NEGATIVE
Protein, ur: NEGATIVE mg/dL
SPECIFIC GRAVITY, URINE: 1.01 (ref 1.005–1.030)
Urobilinogen, UA: 0.2 mg/dL (ref 0.0–1.0)
pH: 6 (ref 5.0–8.0)

## 2015-04-21 LAB — BASIC METABOLIC PANEL
Anion gap: 7 (ref 5–15)
BUN: 10 mg/dL (ref 6–20)
CALCIUM: 9.5 mg/dL (ref 8.9–10.3)
CO2: 24 mmol/L (ref 22–32)
Chloride: 107 mmol/L (ref 101–111)
Creatinine, Ser: 0.81 mg/dL (ref 0.44–1.00)
GFR calc Af Amer: >60 mL/min (ref >60)
GFR calc non Af Amer: >60 mL/min (ref >60)
GLUCOSE: 81 mg/dL (ref 65–99)
Potassium: 3.9 mmol/L (ref 3.5–5.1)
Sodium: 138 mmol/L (ref 135–145)

## 2015-04-21 LAB — ANTITHROMBIN III: AntiThromb III Func: 115 % (ref 75–120)

## 2015-04-21 LAB — I-STAT BETA HCG BLOOD, ED (MC, WL, AP ONLY): I-stat hCG, quantitative: <5 m[IU]/mL (ref <5)

## 2015-04-21 MED ORDER — PROCHLORPERAZINE EDISYLATE 5 MG/ML IJ SOLN
10.0000 mg | Freq: Once | INTRAMUSCULAR | Status: AC
  Administered 2015-04-21: 10 mg via INTRAVENOUS
  Filled 2015-04-21: qty 2

## 2015-04-21 MED ORDER — METHYLPREDNISOLONE SODIUM SUCC 125 MG IJ SOLR
125.0000 mg | Freq: Once | INTRAMUSCULAR | Status: AC
  Administered 2015-04-21: 125 mg via INTRAVENOUS
  Filled 2015-04-21: qty 2

## 2015-04-21 MED ORDER — SODIUM CHLORIDE 0.9 % IV SOLN
INTRAVENOUS | Status: DC
  Administered 2015-04-21: 23:00:00 via INTRAVENOUS

## 2015-04-21 MED ORDER — ALBUTEROL SULFATE (2.5 MG/3ML) 0.083% IN NEBU: 2.5000 mg | INHALATION_SOLUTION | Freq: Four times a day (QID) | RESPIRATORY_TRACT | Status: DC | PRN

## 2015-04-21 MED ORDER — DIPHENHYDRAMINE HCL 50 MG/ML IJ SOLN
25.0000 mg | Freq: Once | INTRAMUSCULAR | Status: AC
  Administered 2015-04-21: 25 mg via INTRAVENOUS
  Filled 2015-04-21: qty 1

## 2015-04-21 MED ORDER — ACETAMINOPHEN 325 MG PO TABS
650.0000 mg | ORAL_TABLET | ORAL | Status: DC | PRN
  Administered 2015-04-21 – 2015-04-22 (×2): 650 mg via ORAL
  Filled 2015-04-21 (×2): qty 2

## 2015-04-21 MED ORDER — STROKE: EARLY STAGES OF RECOVERY BOOK
Freq: Once | Status: AC
  Administered 2015-04-21: 23:00:00

## 2015-04-21 MED ORDER — ACETAMINOPHEN 650 MG RE SUPP: 650.0000 mg | RECTAL | Status: DC | PRN

## 2015-04-21 MED ORDER — ASPIRIN EC 81 MG PO TBEC
81.0000 mg | DELAYED_RELEASE_TABLET | Freq: Every day | ORAL | Status: DC
  Administered 2015-04-22: 81 mg via ORAL
  Filled 2015-04-21: qty 1

## 2015-04-21 MED ORDER — ASPIRIN 81 MG PO CHEW
324.0000 mg | CHEWABLE_TABLET | Freq: Once | ORAL | Status: AC
  Administered 2015-04-21: 324 mg via ORAL
  Filled 2015-04-21: qty 4

## 2015-04-21 MED ORDER — NICOTINE 14 MG/24HR TD PT24
14.0000 mg | MEDICATED_PATCH | Freq: Every day | TRANSDERMAL | Status: DC
  Administered 2015-04-21 – 2015-04-22 (×2): 14 mg via TRANSDERMAL
  Filled 2015-04-21 (×2): qty 1

## 2015-04-21 MED ORDER — MAGNESIUM SULFATE 2 GM/50ML IV SOLN
2.0000 g | Freq: Once | INTRAVENOUS | Status: AC
  Administered 2015-04-21: 2 g via INTRAVENOUS
  Filled 2015-04-21: qty 50

## 2015-04-21 MED ORDER — ALBUTEROL SULFATE HFA 108 (90 BASE) MCG/ACT IN AERS: 1.0000 | INHALATION_SPRAY | Freq: Four times a day (QID) | RESPIRATORY_TRACT | Status: DC | PRN

## 2015-04-21 MED ORDER — SODIUM CHLORIDE 0.9 % IV BOLUS (SEPSIS)
1000.0000 mL | Freq: Once | INTRAVENOUS | Status: AC
  Administered 2015-04-21: 1000 mL via INTRAVENOUS

## 2015-04-21 MED ORDER — ENOXAPARIN SODIUM 40 MG/0.4ML ~~LOC~~ SOLN
40.0000 mg | SUBCUTANEOUS | Status: DC
  Administered 2015-04-21: 40 mg via SUBCUTANEOUS
  Filled 2015-04-21: qty 0.4

## 2015-04-21 MED ORDER — NICOTINE 21 MG/24HR TD PT24: 21.0000 mg | MEDICATED_PATCH | Freq: Every day | TRANSDERMAL | Status: DC

## 2015-04-21 NOTE — Progress Notes (Signed)
Pt arrived at [facility]. Pt alet and oriented. C/O of Headache 9/10. VSS. Safety measures in place. Call light within reach. Will continue to monitor.  Docia Barrier, Therapist, sports.

## 2015-04-21 NOTE — H&P (Signed)
Date: 04/21/2015               Patient Name:  Samantha [name] MRN: [id number]  DOB: 12/19/1975 Age / Sex: 39 y.o., female   PCP: Provider Default, MD         Medical Service: Internal Medicine Teaching Service         Attending Physician: Dr. Oval Linsey, MD    First Contact: Dr. Melburn Hake Pager: [phone]  Second Contact: Dr. Redmond Pulling Pager: [phone]       After Hours (After 5p/  First Contact Pager: [phone]  weekends / holidays): Second Contact Pager: [phone]   Chief Complaint: "I couldn't get my words out this morning."  History of Present Illness: Samantha [name] is a 39 year-old African-American lady with history of tobacco use presenting for an episode of right-sided paresthesias, dysarthria, word-finding difficulty, and headache. Early this morning, around 0300, she awoke with a strange tingling in her right hand. She stayed awake for about 30 minutes, the tingling dissipated, and she went back to bed. She awoke at 0500 and felt her normal self. An hour later while at work, at 0600, she felt a similar tingling sensation in her right hand that extended up her arm into her face over the next few hours. She also had a left-sided headache during this time that was not worsened by light or loud noise. She also "saw some stars" but denied vertigo or double vision. During this episode, she was trying to explain her symptoms to her co-workers but couldn't get the words out; when she was able to get words out, they were very slurred. She felt very frustrated during this time. Her co-workers called 911 and she came to the ED. She has no history of migraines, headache, sickle cell anemia, or strokes. She does endorse a similar event of tingling in her arm about a year ago.  In the ED, her vital signs were stable and CT of her head was negative. her dysarthria and paresthesias dissipated around 1500. She was given 325mg  aspirin but was outside of the tPa window.  Meds: Current Facility-Administered  Medications  Medication Dose Route Frequency Provider Last Rate Last Dose  . acetaminophen (TYLENOL) tablet 650 mg  650 mg Oral Q4H PRN Francesca Oman, DO       Or  . acetaminophen (TYLENOL) suppository 650 mg  650 mg Rectal Q4H PRN Francesca Oman, DO      . [START ON 04/22/2015] aspirin EC tablet 81 mg  81 mg Oral Daily Francesca Oman, DO   81 mg at 04/21/15 1748  . nicotine (NICODERM CQ - dosed in mg/24 hours) patch 14 mg  14 mg Transdermal Daily Francesca Oman, DO       Current Outpatient Prescriptions  Medication Sig Dispense Refill  . albuterol (PROVENTIL HFA;VENTOLIN HFA) 108 (90 BASE) MCG/ACT inhaler Inhale 1-2 puffs into the lungs every 6 (six) hours as needed for wheezing or shortness of breath. 1 Inhaler 0  . benzonatate (TESSALON) 100 MG capsule Take 2 capsules (200 mg total) by mouth 3 (three) times daily as needed for cough. (Patient not taking: Reported on 04/21/2015) 21 capsule 0  . ibuprofen (ADVIL,MOTRIN) 800 MG tablet Take 1 tablet (800 mg total) by mouth 3 (three) times daily. (Patient not taking: Reported on 04/21/2015) 21 tablet 0  . Misc. Devices (STOCKING APPLICATOR REGULAR) MISC 1 Device by Does not apply route daily. (Patient not taking: Reported on 12/28/2014) 1 each 0  .  ondansetron (ZOFRAN ODT) 8 MG disintegrating tablet Take 1 tablet (8 mg total) by mouth every 8 (eight) hours as needed for nausea or vomiting. (Patient not taking: Reported on 04/21/2015) 20 tablet 0    Allergies: Allergies as of 04/21/2015  . (No Known Allergies)   Past Medical History  Diagnosis Date  . Back pain   . Medical history non-contributory    Past Surgical History  Procedure Laterality Date  . Leep     Family History  Problem Relation Age of Onset  . Anesthesia problems Neg Hx   . Other Neg Hx    Social History   Social History  . Marital Status: Single    Spouse Name: N/A  . Number of Children: N/A  . Years of Education: N/A   Occupational History  . Not on file.   Social  History Main Topics  . Smoking status: Former Smoker -- 1.00 packs/day    Types: Cigars    Quit date: 12/25/2014  . Smokeless tobacco: Not on file  . Alcohol Use: Yes     Comment: occasionally  . Drug Use: No  . Sexual Activity: Yes    Birth Control/ Protection: None, Injection   Other Topics Concern  . Not on file   Social History Narrative    Review of Systems  Constitutional: Negative for fever, chills and weight loss.  HENT: Negative for hearing loss.   Eyes: Positive for blurred vision. Negative for double vision, photophobia and pain.  Respiratory: Negative for cough and hemoptysis.   Cardiovascular: Negative for chest pain, palpitations and orthopnea.  Gastrointestinal: Negative for nausea, vomiting and abdominal pain.  Genitourinary: Negative for dysuria.  Musculoskeletal: Negative for myalgias and neck pain.  Skin: Negative for rash.  Neurological: Positive for dizziness, tingling, sensory change, speech change and headaches. Negative for focal weakness, seizures and loss of consciousness.    Physical Exam: Blood pressure 110/67, pulse 77, temperature 98 F (36.7 C), temperature source Oral, resp. rate 18, height 5\' 4"  (1.626 m), weight 61.236 kg (135 lb), SpO2 99 %.  General: resting in bed HEENT: PERRL, EOMI, no scleral icterus Cardiac: RRR, no rubs, murmurs or gallops Pulm: clear to auscultation bilaterally, moving normal volumes of air Abd: soft, nontender, nondistended, BS present Ext: warm and well perfused, no pedal edema Neuro: Alert and oriented X3, cranial nerves II-XII intact. Strength 5/5 throughout. Sensation in tact to light touch throughout. DTRs 2+ throughout. No ataxia. Gait not assessed.  Lab results: Basic Metabolic Panel:  Recent Labs  04/21/15 1126  NA 138  K 3.9  CL 107  CO2 24  GLUCOSE 81  BUN 10  CREATININE 0.81  CALCIUM 9.5   CBC:  Recent Labs  04/21/15 1126  WBC 7.7  NEUTROABS 3.4  HGB 14.5  HCT 43.4  MCV 91.6  PLT  214   Imaging results:  Ct Head Wo Contrast  04/21/2015   CLINICAL DATA:  Numbness involving the right hand and arm. Dysphagia. Memory trouble. Difficulty speaking.  EXAM: CT HEAD WITHOUT CONTRAST  TECHNIQUE: Contiguous axial images were obtained from the base of the skull through the vertex without intravenous contrast.  COMPARISON:  03/04/2013 02/08/2004; brain MRI- 02/08/2004  FINDINGS: Gray-white differentiation is maintained. No CT evidence of acute large territory infarct. No intraparenchymal extra-axial mass or hemorrhage. Unchanged size and configuration of the ventricles and basilar cisterns. No midline shift. Air-fluid level noted within the left maxillary sinus. There is scattered opacification of the left anterior ethmoidal air  cells. There is underpneumatization of the bilateral frontal sinuses. The remaining paranasal sinuses and mastoid air cells are normally aerated. Regional soft tissues appear normal. No displaced calvarial fracture.  IMPRESSION: 1. Negative noncontrast head CT. 2. Sinus disease including an air-fluid level within left maxillary sinus, nonspecific though could be seen in the setting of acute sinusitis. Clinical correlation is advised.   Electronically Signed   By: Sandi Mariscal M.D.   On: 04/21/2015 12:28   Other results: EKG: Rate 60, NSR, normal axis, no Q waves, good PR interval, isolated TWI in aVr, V1, V3.  Assessment & Plan by Problem: Ms. Dunton is a 39 year-old [place] American lady with history of tobacco abuse presenting with a transient episode of right-sided paresthesias, dysarthria, word-finding difficulty, and left temporal headache, concerning for transient ischemic attack versus complex migraine. I am favoring a transient ischemic attack given the resolution of her symptoms and her headache was not very severe. Her neurologic exam was entirely benign. Her risk factors include smoking and a notable family history; her sister has an extensive history of  atherosclerotic disease at a young age. Her first myocardial infarction was at 29 and she has had multiple strokes.   Transient paresthesias, dysarthria, word-finding difficulty, left temporal headache: Differential includes transient ischemic attack versus complex migraine, favoring the former. She will need full stroke work-up per neurology recommendations including: A1C, lipid panel, MRI/MRA of brain without contrast, transthoracic echocardiogram, carotid dopplers, telemetry, and hypercoagulation panel. For her headache, she has been responding well to acetaminophen.  Dispo: Disposition is deferred at this time, awaiting improvement of current medical problems. Anticipated discharge in approximately 2-4 day(s).   The patient does not know have a current PCP (Provider Default, MD) and does need an [facility] hospital follow-up appointment after discharge.  The patient does not have transportation limitations that hinder transportation to clinic appointments.  Signed: Loleta Chance, MD 04/21/2015, 7:30 PM

## 2015-04-21 NOTE — ED Notes (Signed)
Patient now reports she has been experiencing numbness to her right thumb for a few days.

## 2015-04-21 NOTE — Progress Notes (Addendum)
Unable to assess neuro and VS at 2200. Pt at a procedure. Pt came back at 2300.  Docia Barrier, RN

## 2015-04-21 NOTE — ED Notes (Addendum)
Pt arrives via [facility] for c/o numbness of rt hand/arm, dysphagia and memory trouble than began around 0600. pt was at work when symptoms began. After ems arrival, symptoms cleared up. Pt alert, oriented, no neuro deficits noted. Pt ambulatory from ems stretcher to ed stretcher.

## 2015-04-21 NOTE — ED Notes (Signed)
Internal med at bedside.
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
## 2015-04-21 NOTE — ED Notes (Signed)
Attempted report 
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
## 2015-04-21 NOTE — ED Notes (Signed)
Neuro at bedside.

## 2015-04-21 NOTE — ED Notes (Signed)
Pa updating family at bedside

## 2015-04-22 ENCOUNTER — Observation Stay (HOSPITAL_BASED_OUTPATIENT_CLINIC_OR_DEPARTMENT_OTHER): Payer: PRIVATE HEALTH INSURANCE

## 2015-04-22 ENCOUNTER — Observation Stay (HOSPITAL_COMMUNITY): Payer: PRIVATE HEALTH INSURANCE

## 2015-04-22 DIAGNOSIS — G459 Transient cerebral ischemic attack, unspecified: Secondary | ICD-10-CM

## 2015-04-22 DIAGNOSIS — R202 Paresthesia of skin: Secondary | ICD-10-CM | POA: Diagnosis not present

## 2015-04-22 DIAGNOSIS — E236 Other disorders of pituitary gland: Secondary | ICD-10-CM | POA: Diagnosis not present

## 2015-04-22 DIAGNOSIS — E785 Hyperlipidemia, unspecified: Secondary | ICD-10-CM | POA: Diagnosis not present

## 2015-04-22 DIAGNOSIS — F17290 Nicotine dependence, other tobacco product, uncomplicated: Secondary | ICD-10-CM

## 2015-04-22 DIAGNOSIS — R4701 Aphasia: Secondary | ICD-10-CM | POA: Diagnosis not present

## 2015-04-22 LAB — LIPID PANEL
CHOL/HDL RATIO: 3.8 ratio
CHOLESTEROL: 202 mg/dL — AB (ref 0–200)
HDL: 53 mg/dL (ref >40)
LDL Cholesterol: 139 mg/dL — ABNORMAL HIGH (ref 0–99)
TRIGLYCERIDES: 49 mg/dL (ref <150)
VLDL: 10 mg/dL (ref 0–40)

## 2015-04-22 MED ORDER — ASPIRIN 81 MG PO TBEC: 81.0000 mg | DELAYED_RELEASE_TABLET | Freq: Every day | ORAL | Status: DC

## 2015-04-22 MED ORDER — ATORVASTATIN CALCIUM 40 MG PO TABS: 40.0000 mg | ORAL_TABLET | Freq: Every day | ORAL | Status: DC

## 2015-04-22 MED ORDER — ATORVASTATIN CALCIUM 40 MG PO TABS
40.0000 mg | ORAL_TABLET | Freq: Every day | ORAL | Status: DC
  Administered 2015-04-22: 40 mg via ORAL
  Filled 2015-04-22: qty 1

## 2015-04-22 MED ORDER — NICOTINE 14 MG/24HR TD PT24: 14.0000 mg | MEDICATED_PATCH | Freq: Every day | TRANSDERMAL | Status: DC

## 2015-04-22 MED ORDER — WHITE PETROLATUM GEL
Status: AC
  Administered 2015-04-22: 15:00:00
  Filled 2015-04-22: qty 1

## 2015-04-22 NOTE — Evaluation (Addendum)
Speech Language Pathology Evaluation Patient Details Name: Samantha [name] MRN: [id number] DOB: 02-15-1976 Today's Date: 04/22/2015 Time: 8144-8185 SLP Time Calculation (min) (ACUTE ONLY): 20 min  Problem List:  Patient Active Problem List   Diagnosis Date Noted  . TIA (transient ischemic attack) 04/21/2015  . TOA (tubo-ovarian abscess) 11/23/2012  . History of abnormal Pap smear 12/20/2011  . History of PID 12/20/2011   Past Medical History:  Past Medical History  Diagnosis Date  . Back pain   . Medical history non-contributory    Past Surgical History:  Past Surgical History  Procedure Laterality Date  . Leep     HPI:  39 year-old African-American lady with history of tobacco use presented to ED on 04/21/15 for an episode of right-sided paresthesias, dysarthria, word-finding difficulty, and headache. She also had a left-sided headache during this time that was not worsened by light or loud noise.  During this episode, she was trying to explain her symptoms to her co-workers but couldn't get the words out; when she was able to get words out, they were very slurred. She felt very frustrated during this time. Her co-workers called 911 and she came to the ED. She has no history of migraines, headache, sickle cell anemia, or strokes. She does endorse a similar event of tingling in her arm about a year ago. MRA on 04/21/15 negative for neurological findings.  Assessment / Plan / Recommendation Clinical Impression   Pt's speech was intelligible in complex conversation and auditory comprehension and verbal expression tasks were all [facility]; pt's cognitive skills appear to be at baseline (9th grade education level indicated by pt) with tasks given during SLE.  No ST f/u indicated at this time as initial symptoms of dysarthria and aphasia have resolved.    SLP Assessment  Patient does not need any further Speech Language Pathology Services    Follow Up Recommendations  None    Frequency and  Duration   n/a     Pertinent Vitals/Pain Pain Assessment: No/denies pain   SLP Goals   N/A  SLP Evaluation Prior Functioning  Cognitive/Linguistic Baseline: Within functional limits Type of Home: Apartment  Lives With: Daughter Available Help at Discharge: Family;Available 24 hours/day Education:  (9th grade education) Vocation: Full time employment   Cognition  Overall Cognitive Status: Within Functional Limits for tasks assessed Arousal/Alertness: Awake/alert Orientation Level: Oriented X4    Comprehension  Auditory Comprehension Overall Auditory Comprehension: Appears within functional limits for tasks assessed Reading Comprehension Reading Status: Not tested    Expression Expression Primary Mode of Expression: Verbal Verbal Expression Overall Verbal Expression: Appears within functional limits for tasks assessed Pragmatics: No impairment Non-Verbal Means of Communication: Not applicable Written Expression Dominant Hand: Right Written Expression: Not tested   Oral / Motor Oral Motor/Sensory Function Overall Oral Motor/Sensory Function: Appears within functional limits for tasks assessed Facial Symmetry: Within Functional Limits Motor Speech Overall Motor Speech: Appears within functional limits for tasks assessed Respiration: Within functional limits Phonation: Normal Resonance: Within functional limits Articulation: Within functional limitis Intelligibility: Intelligible Motor Planning: Witnin functional limits Motor Speech Errors: Not applicable        Bricelyn Freestone,PAT, M.S, CCC-SLP 04/22/2015, 12:31 PM

## 2015-04-22 NOTE — Progress Notes (Signed)
*  PRELIMINARY RESULTS* Vascular Ultrasound Carotid Duplex (Doppler) has been completed.  Findings suggest 1-39% internal carotid artery stenosis bilaterally. Vertebral arteries are patent with antegrade flow.  04/22/2015 10:53 AM Maudry Mayhew, RVT, RDCS, RDMS

## 2015-04-22 NOTE — Progress Notes (Signed)
Patient ID: Samantha [name], female   DOB: 04-25-1976, 39 y.o.   MRN: [id number]   Subjective: Samantha [name] slept well and is feeling her normal self this morning. She denies any headache or tingling in her arm. She was amenable to undergoing several tests today to help find treatable risk factors for what seems to be a transient ischemic attack.  Objective: Vital signs in last 24 hours: Filed Vitals:   04/22/15 0000 04/22/15 0200 04/22/15 0400 04/22/15 0600  BP: 108/65 105/64 196/63 109/74  Pulse: 58 59 57 60  Temp: 99.1 F (37.3 C) 98.1 F (36.7 C) 99.1 F (37.3 C) 99.3 F (37.4 C)  TempSrc: Oral Oral Oral Oral  Resp: 18 19 18 16   Height:      Weight:      SpO2: 95% 99% 100% 99%   General: resting in bed HEENT: no scleral icterus Cardiac: RRR, no rubs, murmurs or gallops Pulm: clear to auscultation bilaterally, moving normal volumes of air Abd: soft, nontender, nondistended, BS present Ext: warm and well perfused, no pedal edema  Lab Results:Fasting Lipid Panel:  Recent Labs Lab 04/22/15 0635  CHOL 202*  HDL 53  LDLCALC 139*  TRIG 49  CHOLHDL 3.8   Studies/Results: Mr Brain Wo Contrast 04/21/2015 IMPRESSION: MRI HEAD IMPRESSION:  1. Normal brain MRI with no acute intracranial infarct or other abnormality identified. 2. Empty sella. This is likely an incidental finding, with no other findings to suggest elevated intracranial pressures. 3. Moderate left maxillary and ethmoidal sinus disease.  MRA HEAD IMPRESSION:  Normal MRA of the intracranial circulation.   Electronically Signed   By: Jeannine Boga M.D.   On: 04/21/2015 23:30   Medications: I have reviewed the patient's current medications. Scheduled Meds: . aspirin EC  81 mg Oral Daily  . enoxaparin (LOVENOX) injection  40 mg Subcutaneous Q24H  . nicotine  14 mg Transdermal Daily   Continuous Infusions:  PRN Meds:.acetaminophen **OR** acetaminophen, albuterol   Assessment/Plan:  Transient ischemic attack: Her  symptoms of transient localized paresthesias and word-finding difficulty seem most consistent with transient ischemic attack; her symptoms have completely resolved. She is followed by neurology and is undergoing tests for hypercoagulability, carotid dopplers, transthoracic echocardiogram, and physical therapy, speech, and occupational therapy consultations. Pending the results of these tests, she may be clear for discharge this afternoon. -Will follow-up transthoracic echocardiogram and ensure she is seen by physical and occupational therapy today -Possible discharge today pending TTE results -Discharge with 81mg  aspirin daily  Hyperlipidemia: Her total cholesterol was 202, LDL was 139. We will start her on a statin. 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 However this finding should be noted so providers will be more inclined to include IIH in their differential should she present with headache, vision loss, and papilledema in the future  Dispo: Disposition is deferred at this time, awaiting improvement of current medical problems.  Anticipated discharge in approximately 0-1 day(s).   The patient does not have a current PCP (Provider Default, MD) and does need an [facility] hospital follow-up appointment after discharge.  The patient does not have transportation limitations that hinder transportation to clinic appointments.  .Services Needed at time of discharge: Y = Yes, Blank = No PT:   OT:   RN:   Equipment:   Other:  Loleta Chance, MD 04/22/2015, 12:52 PM
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
## 2015-04-22 NOTE — Evaluation (Signed)
Physical Therapy Evaluation Patient Details Name: Samantha [name] MRN: [id number] DOB: 1975/09/26 Today's Date: 04/22/2015   History of Present Illness  year-old African-American lady with history of tobacco use presented to ED on 04/21/15 for an episode of right-sided paresthesias, dysarthria, word-finding difficulty, and headache. MRI negative.  Clinical Impression  Patient seen for evaluation, pt reports resolution of symptoms. Patient mobilizing independently, performed increased ambulation, DGI balance assessment and stair negotiation. At this time feel patient is independent and safe for d/c home. No further acute PT needs. Will sign off.    Follow Up Recommendations No PT follow up    Equipment Recommendations  None recommended by PT    Recommendations for Other Services       Precautions / Restrictions Restrictions Weight Bearing Restrictions: No      Mobility  Bed Mobility Overal bed mobility: Independent                Transfers Overall transfer level: Independent Equipment used: None                Ambulation/Gait Ambulation/Gait assistance: Independent Ambulation Distance (Feet): 640 Feet Assistive device: None Gait Pattern/deviations: WFL(Within Functional Limits)   Gait velocity interpretation: at or above normal speed for age/gender    Stairs Stairs: Yes Stairs assistance: Modified independent (Device/Increase time) Stair Management: One rail Right;Alternating pattern Number of Stairs: 12 General stair comments: No physical assist required  Wheelchair Mobility    Modified Rankin (Stroke Patients Only)       Balance                                 Standardized Balance Assessment Standardized Balance Assessment : Dynamic Gait Index   Dynamic Gait Index Level Surface: Normal Change in Gait Speed: Normal Gait with Horizontal Head Turns: Normal Gait with Vertical Head Turns: Normal Gait and Pivot Turn: Normal Step Over  Obstacle: Normal Step Around Obstacles: Normal Steps: Mild Impairment Total Score: 23       Pertinent Vitals/Pain Pain Assessment: No/denies pain    Home Living Family/patient expects to be discharged to:: Private residence   Available Help at Discharge: Family;Available 24 hours/day Type of Home: Apartment Home Access: Stairs to enter Entrance Stairs-Rails: Can reach both Entrance Stairs-Number of Steps: 2 Home Layout: Two level Home Equipment: None      Prior Function Level of Independence: Independent               Hand Dominance   Dominant Hand: Right    Extremity/Trunk Assessment   Upper Extremity Assessment: Overall WFL for tasks assessed           Lower Extremity Assessment: Overall WFL for tasks assessed         Communication   Communication: No difficulties  Cognition Arousal/Alertness: Awake/alert Behavior During Therapy: WFL for tasks assessed/performed Overall Cognitive Status: Within Functional Limits for tasks assessed                      General Comments      Exercises        Assessment/Plan    PT Assessment Patent does not need any further PT services  PT Diagnosis     PT Problem List    PT Treatment Interventions     PT Goals (Current goals can be found in the Care Plan section) Acute Rehab PT Goals Patient Stated Goal: to go home PT  Goal Formulation: All assessment and education complete, DC therapy    Frequency     Barriers to discharge        Co-evaluation               End of Session Equipment Utilized During Treatment: Gait belt Activity Tolerance: Patient tolerated treatment well Patient left: in bed (sitting EOB) Nurse Communication: Mobility status         Time: 1344-1401 PT Time Calculation (min) (ACUTE ONLY): 17 min   Charges:   PT Evaluation $Initial PT Evaluation Tier I: 1 Procedure     PT G Codes:   PT G-Codes **NOT FOR INPATIENT CLASS** Functional Assessment Tool Used:  DGI Functional Limitation: Mobility: Walking and moving around Mobility: Walking and Moving Around Current Status (W4665): 0 percent impaired, limited or restricted Mobility: Walking and Moving Around Goal Status (L9357): 0 percent impaired, limited or restricted Mobility: Walking and Moving Around Discharge Status [phone]): 0 percent impaired, limited or restricted    Duncan Dull 04/22/2015, 2:05 PM Alben Deeds, [place] DPT  [phone]

## 2015-04-22 NOTE — Progress Notes (Signed)
Echocardiogram 2D Echocardiogram has been performed.  Samantha [name] 04/22/2015, 1:13 PM
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
## 2015-04-22 NOTE — Progress Notes (Signed)
Pt discharged with family at this time taking all personal belongings at 18:35pm. Neuro intact, no noted distress. IV discontinued, dry dressing applied. Discharge instructions provided with verbal understanding pt to pick up prescriptions and made aware of follow up appts. Pt denied pain or discomfort.

## 2015-04-23 LAB — HEMOGLOBIN A1C
Hgb A1c MFr Bld: 5.8 % — ABNORMAL HIGH (ref 4.8–5.6)
Mean Plasma Glucose: 120 mg/dL

## 2015-04-24 LAB — PROTEIN C, TOTAL: Protein C, Total: 111 % (ref 70–140)

## 2015-04-24 LAB — HOMOCYSTEINE: Homocysteine: 4.8 umol/L (ref 0.0–15.0)

## 2015-04-25 LAB — BETA-2-GLYCOPROTEIN I ABS, IGG/M/A: Beta-2 Glyco I IgG: <9 GPI IgG units (ref 0–20)

## 2015-04-25 LAB — SICKLE CELL SCREEN: Sickle Cell Screen: NEGATIVE

## 2015-04-25 LAB — CARDIOLIPIN ANTIBODIES, IGG, IGM, IGA
Anticardiolipin IgA: <9 APL U/mL (ref 0–11)
Anticardiolipin IgG: <9 GPL U/mL (ref 0–14)
Anticardiolipin IgM: <9 MPL U/mL (ref 0–12)

## 2015-04-26 LAB — LUPUS ANTICOAGULANT PANEL
DRVVT: 36.2 s (ref 0.0–55.1)
PTT Lupus Anticoagulant: 41 s (ref 0.0–50.0)

## 2015-04-26 LAB — PROTEIN S ACTIVITY: PROTEIN S ACTIVITY: 79 % (ref 60–145)

## 2015-04-26 LAB — PROTEIN S, TOTAL: Protein S Ag, Total: 110 % (ref 58–150)

## 2015-04-26 LAB — PROTEIN C ACTIVITY: Protein C Activity: 155 % — ABNORMAL HIGH (ref 74–151)

## 2015-04-27 ENCOUNTER — Telehealth: Payer: Self-pay | Admitting: Internal Medicine

## 2015-04-27 LAB — PROTHROMBIN GENE MUTATION

## 2015-04-27 LAB — FACTOR 5 LEIDEN

## 2015-04-27 NOTE — Telephone Encounter (Signed)
Call to patient to follow up from hospital stay and to verify pt is aware of appointment for 05/04/15 at 10:15 lmtcb to confirm and if pt has any questions

## 2015-05-04 ENCOUNTER — Ambulatory Visit (INDEPENDENT_AMBULATORY_CARE_PROVIDER_SITE_OTHER): Payer: PRIVATE HEALTH INSURANCE | Admitting: Internal Medicine

## 2015-05-04 ENCOUNTER — Encounter: Payer: Self-pay | Admitting: Internal Medicine

## 2015-05-04 VITALS — BP 112/71 | HR 62 | Temp 98.4°F | Ht 64.0 in | Wt 143.0 lb

## 2015-05-04 DIAGNOSIS — R21 Rash and other nonspecific skin eruption: Secondary | ICD-10-CM

## 2015-05-04 DIAGNOSIS — R630 Anorexia: Secondary | ICD-10-CM | POA: Insufficient documentation

## 2015-05-04 DIAGNOSIS — R002 Palpitations: Secondary | ICD-10-CM | POA: Insufficient documentation

## 2015-05-04 DIAGNOSIS — G459 Transient cerebral ischemic attack, unspecified: Secondary | ICD-10-CM | POA: Insufficient documentation

## 2015-05-04 DIAGNOSIS — Z8673 Personal history of transient ischemic attack (TIA), and cerebral infarction without residual deficits: Secondary | ICD-10-CM | POA: Diagnosis not present

## 2015-05-04 DIAGNOSIS — Z6824 Body mass index (BMI) 24.0-24.9, adult: Secondary | ICD-10-CM

## 2015-05-04 DIAGNOSIS — E785 Hyperlipidemia, unspecified: Secondary | ICD-10-CM

## 2015-05-04 DIAGNOSIS — I639 Cerebral infarction, unspecified: Secondary | ICD-10-CM

## 2015-05-04 DIAGNOSIS — F17210 Nicotine dependence, cigarettes, uncomplicated: Secondary | ICD-10-CM

## 2015-05-04 DIAGNOSIS — R7309 Other abnormal glucose: Secondary | ICD-10-CM

## 2015-05-04 DIAGNOSIS — R0602 Shortness of breath: Secondary | ICD-10-CM

## 2015-05-04 DIAGNOSIS — Z720 Tobacco use: Secondary | ICD-10-CM | POA: Insufficient documentation

## 2015-05-04 DIAGNOSIS — F17200 Nicotine dependence, unspecified, uncomplicated: Secondary | ICD-10-CM

## 2015-05-04 DIAGNOSIS — R7303 Prediabetes: Secondary | ICD-10-CM | POA: Insufficient documentation

## 2015-05-04 MED ORDER — BUPROPION HCL ER (SR) 150 MG PO TB12: ORAL_TABLET | ORAL | Status: DC

## 2015-05-04 MED ORDER — IBUPROFEN 800 MG PO TABS: 800.0000 mg | ORAL_TABLET | Freq: Three times a day (TID) | ORAL | Status: DC

## 2015-05-04 MED ORDER — ALBUTEROL SULFATE HFA 108 (90 BASE) MCG/ACT IN AERS: 1.0000 | INHALATION_SPRAY | Freq: Four times a day (QID) | RESPIRATORY_TRACT | Status: DC | PRN

## 2015-05-04 NOTE — Assessment & Plan Note (Signed)
  Assessment: Progress toward smoking cessation:   none Barriers to progress toward smoking cessation:   cravings, habit Comments: Patient reports that she continues to smoke 4 cigarettes/day since hospital discharge. Notes that the patches helped during her hospitalization but no longer seem to be helping. Wants to quit.   Plan: Instruction/counseling given:  I counseled patient on the dangers of tobacco use, advised patient to stop smoking, and reviewed strategies to maximize success. Educational resources provided:    Self management tools provided:    Medications to assist with smoking cessation:  Wellbutrin  Patient agreed to the following self-care plans for smoking cessation:    Other plans: Started patient on Wellbutrin today. Discussed how to take it and setting quit date 1 week after starting the medication.

## 2015-05-04 NOTE — Assessment & Plan Note (Signed)
Patient noted to have a HgbA1c of 5.8. Does endorse some increased thirst and urination. Will continue to monitor, won't start any medications today.

## 2015-05-04 NOTE — Assessment & Plan Note (Signed)
Patient reports rash that began after her hospitalization. Has appeared at various places on her body (arms, legs, abdomen, back) beginning with a small bump that gradually spreads to erythematous rash and disappears after a few days. Notes itching but no pain. She has been applying a hydrocortisone cream that she does not believe has helped much. She believes the rash is improving. Denies any bug bites, is not outdoors much, no pets.   On exam today she has an area of faint erythema on left arm and lower back, no macules or papules.   Conservative treatment. The rash appears to be resolving on its own. Not concerning for a drug rash (recently started a statin).

## 2015-05-04 NOTE — Assessment & Plan Note (Addendum)
Patient recently admitted on 04/21/15 for CVA. She had a 1 day history of right sided numbness, dysarthria, difficulty with word finding and headache. Symptoms had resolved by the time she got to the ED. She reported that she had a similar episode 1 year ago. Neurologic exam was unremarkable at the hospital. CT and MRI/MRA on 8/11 was negative for ischemia or any other abnormalities but did note an incidental finding of empty sella tursica. Carotid dopplers showed 1-39% stenosis bilaterally. ECHO EF 50-55% and unremarkable. HgbA1c 5.8. She was noted to have hyperlipidemia (total cholesterol 202, LDL 139) and started on atorvastain 40 mg daily. Discharged on ASA 81 mg daily.   Has a strong family history of atherosclerotic disease. Extensive hypercoagulability work up done in the hospital all of which has come back negative.    In clinic today, she reports continued headaches. Left temporal, sharp stabbing non-radiating pain. Denies any N/V, aura or phonophobia but does not some photophobia. HA occuring 3-4 times a week. She has not taken anything for the headaches. She also notes continued mild difficulty with word finding but dysarthria has resolved. Also complains of palpitations happening every few days.   On exam today, she has decreased sensation to pinprick on her right face and right arm. Otherwise normal neuro exam.   She continues to be an everyday smoker. Smoking 4 cigarettes/day. Notes patches are not helping.   - She is scheduled to follow up with Neuro  - Ibuprofen 800 mg tid prn headaches - Continue ASA and Statin - Will set her up with an event monitor for the palpitations. ? A Fib causing the stroke. - Wellbutrin for smoking cessation.

## 2015-05-04 NOTE — Assessment & Plan Note (Signed)
Patient reports long history of SOB. Has had an albuterol inhaler in the past that seemed to resolve the symptoms. She does exhibit wheezes on exam today. Never had any PFTs done. Has a 15 year smoking history.   Asthma vs COPD   - Albuterol inhaler - PFTs

## 2015-05-04 NOTE — Assessment & Plan Note (Signed)
Lipid Panel     Component Value Date/Time   CHOL 202* 04/22/2015 0635   TRIG 49 04/22/2015 0635   HDL 53 04/22/2015 0635   CHOLHDL 3.8 04/22/2015 0635   VLDL 10 04/22/2015 0635   LDLCALC 139* 04/22/2015 3361     Patient noted to have an increased cholesterol during hospitalization. On Atorvastain 40 mg daily. Continue current medications.

## 2015-05-04 NOTE — Assessment & Plan Note (Signed)
Patient reports decreased appetite today. This has been a problem since her hospitalization. Reports early satiety as well. Does not have any difficulty swallowing or pain with swallowing. No abdominal pain N/V. Weight is down 8 lb since discharge. She is working on smoking cessation, may have improvement in appetite following quitting smoking. Will continue to monitor appetite and weights in the future.

## 2015-05-04 NOTE — Patient Instructions (Signed)
Thank you for coming in today  - Use the Ibuprofen as needed for your headaches - I have given you a prescription for Wellbutrin today. You will take 1 pill for 3 days and then 1 pill twice a day thereafter. Set your quit date 1 week after starting the medication.  - I have given you a new prescription for an Albuterol inhaler today. We will set you up for some lung testing and call you when we have an appointment for you. - I am giving you a referral to cardiology. We will call you with the appointment.  - If your rash and appetite do not improve in the next 2-3 weeks, call the clinic for an appointment.

## 2015-05-04 NOTE — Progress Notes (Signed)
Patient ID: Samantha [name], female   DOB: [birth year], 39 y.o.   MRN: [id number]   Subjective:   Patient ID: Samantha [name] female   DOB: Jan 25, 1976 39 y.o.   MRN: [id number]  HPI: [name] is a 39 y.o. female with a history of HLD, TIA presents to clinic today for hospital follow up for CVA.   For details of today's visit please refer to problem based charting.  Past Medical History  Diagnosis Date  . Back pain   . Medical history non-contributory    Current Outpatient Prescriptions  Medication Sig Dispense Refill  . albuterol (PROVENTIL HFA;VENTOLIN HFA) 108 (90 BASE) MCG/ACT inhaler Inhale 1-2 puffs into the lungs every 6 (six) hours as needed for wheezing or shortness of breath. 1 Inhaler 0  . aspirin EC 81 MG EC tablet Take 1 tablet (81 mg total) by mouth daily. To prevent another stroke 90 tablet 3  . atorvastatin (LIPITOR) 40 MG tablet Take 1 tablet (40 mg total) by mouth daily at 6 PM. To keep cholesterol levels low 90 tablet 3  . ibuprofen (ADVIL,MOTRIN) 800 MG tablet Take 1 tablet (800 mg total) by mouth 3 (three) times daily. (Patient not taking: Reported on 04/21/2015) 21 tablet 0  . nicotine (NICODERM CQ - DOSED IN MG/24 HOURS) 14 mg/24hr patch Place 1 patch (14 mg total) onto the skin daily. 28 patch 0   No current facility-administered medications for this visit.   Family History  Problem Relation Age of Onset  . Anesthesia problems Neg Hx   . Other Neg Hx    Social History   Social History  . Marital Status: Single    Spouse Name: N/A  . Number of Children: N/A  . Years of Education: N/A   Social History Main Topics  . Smoking status: Former Smoker -- 1.00 packs/day    Types: Cigars    Quit date: 12/25/2014  . Smokeless tobacco: None  . Alcohol Use: Yes     Comment: occasionally  . Drug Use: No  . Sexual Activity: Yes    Birth Control/ Protection: None, Injection   Other Topics Concern  . None   Social History Narrative   Review of Systems: Review of  Systems  Constitutional: Negative for fever and chills.  Eyes: Negative for blurred vision and double vision.  Respiratory: Negative for cough and shortness of breath.   Cardiovascular: Positive for palpitations. Negative for chest pain.  Gastrointestinal: Negative for nausea, vomiting, abdominal pain, diarrhea, constipation and blood in stool.  Skin: Positive for itching and rash.  Neurological: Positive for sensory change, speech change and headaches. Negative for dizziness, tingling and focal weakness.   Objective:  Physical Exam: Filed Vitals:   05/04/15 1043  BP: 112/71  Pulse: 62  Temp: 98.4 F (36.9 C)  TempSrc: Oral  Height: 5\' 4"  (1.626 m)  Weight: 143 lb (64.864 kg)  SpO2: 100%   GENERAL- alert, co-operative, appears as stated age, not in any distress. HEENT- Atraumatic, normocephalic, PERRL, EOMI, oral mucosa appears moist, good and intact dentition. No carotid bruit, no cervical LN enlargement, thyroid does not appear enlarged, neck supple. CARDIAC- RRR, no murmurs, rubs or gallops. RESP- Moving equal volumes of air, diffuse wheezes, no crackles. ABDOMEN- Soft, nontender, no guarding or rebound, no palpable masses or organomegaly, bowel sounds present. NEURO- No obvious Cr N abnormality, strenght upper and lower extremities- 5/5, Decreased sensation to pinprick on right face and right arm, Gait- Normal. EXTREMITIES- pulse 2+, symmetric, no pedal  edema. SKIN- Warm, dry, areas of erythema on left arm and lower back, no macules or papules  PSYCH- Normal mood and affect, appropriate thought content and speech.  Assessment & Plan:   Case discussed with Dr. Eppie Gibson. Please refer to Problem based carting for further details of today's visit.

## 2015-05-04 NOTE — Assessment & Plan Note (Signed)
Patient reports palpations occuring every few days. Last less than a minute, taking a deep breath seems to help. She notes that she first noticed them after she was discharged.  Will get her set up with an event recorder to try and catch one of these episodes. ? A Fib that caused the CVA.

## 2015-05-05 NOTE — Progress Notes (Signed)
I saw and evaluated the patient. I personally confirmed the key portions of Dr. Shanna Cisco history and exam and reviewed pertinent patient test results. The assessment, diagnosis, and plan were formulated together and I agree with the documentation in the resident's note with the following exception:  It is far too premature to label Ms. Wolfinger as having pre-diabetes based upon a single Hgb A1C reading of 5.8.  Before labeling her as such I would want a repeat Hgb A1C in the future while she has been well, to see if it remains above the arbitrary cut-off of 5.7 for the artificial label of "pre-diabetes".  It should be appreciated that she is of normal weight, does not have hypertension, and has a triglycerides level of only 49.  Clinically I would estimate her risk of developing diabetes as exceedingly low without a significant change in her weight or a secondary cause of diabetes developing (steroid induced, pancreatitis with pancreatic insufficiency, etc).

## 2015-05-11 ENCOUNTER — Ambulatory Visit (HOSPITAL_COMMUNITY)
Admission: RE | Admit: 2015-05-11 | Discharge: 2015-05-11 | Disposition: A | Discharge status: Home / Self Care | Payer: PRIVATE HEALTH INSURANCE | Source: Ambulatory Visit | Attending: Internal Medicine | Admitting: Internal Medicine

## 2015-05-11 DIAGNOSIS — R0602 Shortness of breath: Secondary | ICD-10-CM | POA: Insufficient documentation

## 2015-05-11 LAB — PULMONARY FUNCTION TEST
DL/VA % PRED: 114 %
DL/VA: 5.62 ml/min/mmHg/L
DLCO UNC % PRED: 84 %
DLCO unc: 21.63 ml/min/mmHg
FEF 25-75 POST: 1.9 L/s
FEF 25-75 Pre: 1.49 L/sec
FEF2575-%Change-Post: 27 %
FEF2575-%PRED-POST: 64 %
FEF2575-%Pred-Pre: 50 %
FEV1-%CHANGE-POST: 6 %
FEV1-%PRED-PRE: 74 %
FEV1-%Pred-Post: 78 %
FEV1-Post: 2.09 L
FEV1-Pre: 1.97 L
FEV1FVC-%CHANGE-POST: 9 %
FEV1FVC-%Pred-Pre: 86 %
FEV6-%Change-Post: -2 %
FEV6-%PRED-POST: 82 %
FEV6-%Pred-Pre: 85 %
FEV6-Post: 2.62 L
FEV6-Pre: 2.7 L
FEV6FVC-%Change-Post: 0 %
FEV6FVC-%Pred-Post: 102 %
FEV6FVC-%Pred-Pre: 101 %
FVC-%Change-Post: -3 %
FVC-%PRED-POST: 81 %
FVC-%Pred-Pre: 84 %
FVC-Post: 2.62 L
FVC-Pre: 2.71 L
POST FEV1/FVC RATIO: 80 %
PRE FEV6/FVC RATIO: 100 %
Post FEV6/FVC ratio: 100 %
Pre FEV1/FVC ratio: 73 %
RV % pred: 89 %
RV: 1.43 L
TLC % pred: 72 %
TLC: 3.78 L

## 2015-05-11 MED ORDER — ALBUTEROL SULFATE (2.5 MG/3ML) 0.083% IN NEBU
2.5000 mg | INHALATION_SOLUTION | Freq: Once | RESPIRATORY_TRACT | Status: AC
  Administered 2015-05-11: 2.5 mg via RESPIRATORY_TRACT

## 2015-06-13 ENCOUNTER — Telehealth: Payer: Self-pay | Admitting: *Deleted

## 2015-06-13 NOTE — Telephone Encounter (Signed)
Pt called with c/o rapid heart beat.  She was last seen on 8/24 with Dr Charlynn Grimes. She had the same complaint at that time and has been waiting for an Event Monitor.  She has not heard from [place]. Today at work she started having sensation of heart palpations, some SOB and became very anxious.The lasted  about an hour.  She calmed herself down by talked to her boss at work and slowly the feeling went away.    This happened last week and she reports that she had severe sweating that required using a rag, several times to wipe her off.  These events are happening about every other day.   Pt # [phone] An event monitor was ordered, will call and see where that stands. Please advise.

## 2015-06-13 NOTE — Telephone Encounter (Signed)
An event monitor is appropriate. If the rapid heart rate is happening more frequently, please ask her to come into clinic within the next few days for a follow up evaluation. If she is having worsening symptoms like lightheadedness or fainting, she will need to go to the ED.

## 2015-06-14 ENCOUNTER — Ambulatory Visit (INDEPENDENT_AMBULATORY_CARE_PROVIDER_SITE_OTHER): Payer: PRIVATE HEALTH INSURANCE

## 2015-06-14 ENCOUNTER — Other Ambulatory Visit: Payer: Self-pay | Admitting: Internal Medicine

## 2015-06-14 DIAGNOSIS — I4891 Unspecified atrial fibrillation: Secondary | ICD-10-CM

## 2015-06-14 DIAGNOSIS — R002 Palpitations: Secondary | ICD-10-CM

## 2015-06-14 DIAGNOSIS — G459 Transient cerebral ischemic attack, unspecified: Secondary | ICD-10-CM

## 2015-09-06 ENCOUNTER — Emergency Department (INDEPENDENT_AMBULATORY_CARE_PROVIDER_SITE_OTHER)
Admission: EM | Admit: 2015-09-06 | Discharge: 2015-09-06 | Disposition: A | Discharge status: Home / Self Care | Payer: PRIVATE HEALTH INSURANCE | Source: Home / Self Care | Attending: Emergency Medicine | Admitting: Emergency Medicine

## 2015-09-06 ENCOUNTER — Encounter (HOSPITAL_COMMUNITY): Payer: Self-pay | Admitting: *Deleted

## 2015-09-06 DIAGNOSIS — J40 Bronchitis, not specified as acute or chronic: Secondary | ICD-10-CM | POA: Diagnosis not present

## 2015-09-06 DIAGNOSIS — H6122 Impacted cerumen, left ear: Secondary | ICD-10-CM

## 2015-09-06 HISTORY — DX: Transient cerebral ischemic attack, unspecified: G45.9

## 2015-09-06 MED ORDER — IPRATROPIUM-ALBUTEROL 0.5-2.5 (3) MG/3ML IN SOLN
RESPIRATORY_TRACT | Status: AC
  Filled 2015-09-06: qty 3

## 2015-09-06 MED ORDER — HYDROCODONE-ACETAMINOPHEN 5-325 MG PO TABS
1.0000 | ORAL_TABLET | Freq: Once | ORAL | Status: AC
Start: 2015-09-06 — End: 2015-09-06
  Administered 2015-09-06: 1 via ORAL

## 2015-09-06 MED ORDER — IBUPROFEN 800 MG PO TABS
ORAL_TABLET | ORAL | Status: AC
  Filled 2015-09-06: qty 1

## 2015-09-06 MED ORDER — PREDNISONE 50 MG PO TABS: ORAL_TABLET | ORAL | Status: DC

## 2015-09-06 MED ORDER — ALBUTEROL SULFATE HFA 108 (90 BASE) MCG/ACT IN AERS: 2.0000 | INHALATION_SPRAY | RESPIRATORY_TRACT | Status: DC | PRN

## 2015-09-06 MED ORDER — NEOMYCIN-POLYMYXIN-HC 3.5-10000-1 OT SUSP: 4.0000 [drp] | Freq: Three times a day (TID) | OTIC | Status: DC

## 2015-09-06 MED ORDER — IPRATROPIUM-ALBUTEROL 0.5-2.5 (3) MG/3ML IN SOLN
3.0000 mL | Freq: Once | RESPIRATORY_TRACT | Status: AC
  Administered 2015-09-06: 3 mL via RESPIRATORY_TRACT

## 2015-09-06 MED ORDER — AZITHROMYCIN 250 MG PO TABS: ORAL_TABLET | ORAL | Status: DC

## 2015-09-06 MED ORDER — HYDROCODONE-ACETAMINOPHEN 5-325 MG PO TABS
ORAL_TABLET | ORAL | Status: AC
  Filled 2015-09-06: qty 1

## 2015-09-06 MED ORDER — IBUPROFEN 800 MG PO TABS
800.0000 mg | ORAL_TABLET | Freq: Once | ORAL | Status: AC
  Administered 2015-09-06: 800 mg via ORAL

## 2015-09-06 MED ORDER — HYDROCODONE-HOMATROPINE 5-1.5 MG/5ML PO SYRP: 5.0000 mL | ORAL_SOLUTION | Freq: Four times a day (QID) | ORAL | Status: DC | PRN

## 2015-09-06 NOTE — ED Provider Notes (Addendum)
CSN: [id number]     Arrival date & time 09/06/15  1332 History   First MD Initiated Contact with Patient 09/06/15 1521     Chief Complaint  Patient presents with  . Otalgia  . Generalized Body Aches   (Consider location/radiation/quality/duration/timing/severity/associated sxs/prior Treatment) HPI  She is a 39 year old woman here for evaluation of body aches and left ear pain. She states her body has been sore for about a week. She also reports cold symptoms including nasal congestion, rhinorrhea, sore throat, and cough. She states the sore throat has resolved. She denies any wheezing or shortness of breath. No documented fever. She does report nausea, but no vomiting. Her appetite is decreased, but she is tolerating liquids well. She also states her left ear feels clogged up and is painful.  Past Medical History  Diagnosis Date  . Back pain   . Medical history non-contributory   . Stroke ([place])   . TIA (transient ischemic attack)    Past Surgical History  Procedure Laterality Date  . Leep     Family History  Problem Relation Age of Onset  . Anesthesia problems Neg Hx   . Other Neg Hx    Social History  Substance Use Topics  . Smoking status: Current Every Day Smoker -- 1.00 packs/day    Last Attempt to Quit: 12/25/2014  . Smokeless tobacco: None  . Alcohol Use: No   OB History    Gravida Para Term Preterm AB TAB SAB Ectopic Multiple Living   2    1  1   1      Review of Systems As in history of present illness Allergies  Review of patient's allergies indicates no known allergies.  Home Medications   Prior to Admission medications   Medication Sig Start Date End Date Taking? Authorizing Provider  MedroxyPROGESTERone Acetate (DEPO-PROVERA IM) Inject into the muscle.   Yes Historical Provider, MD  albuterol (PROVENTIL HFA;VENTOLIN HFA) 108 (90 Base) MCG/ACT inhaler Inhale 2 puffs into the lungs every 4 (four) hours as needed for wheezing or shortness of breath (cough).  09/06/15   Melony Overly, MD  aspirin EC 81 MG EC tablet Take 1 tablet (81 mg total) by mouth daily. To prevent another stroke 04/22/15   Loleta Chance, MD  atorvastatin (LIPITOR) 40 MG tablet Take 1 tablet (40 mg total) by mouth daily at 6 PM. To keep cholesterol levels low 04/22/15   Loleta Chance, MD  azithromycin (ZITHROMAX Z-PAK) 250 MG tablet Take 2 pills today, then 1 pill daily until gone. 09/06/15   Melony Overly, MD  buPROPion [facility] SR) 150 MG 12 hr tablet qdaily x 3 days, then bid 05/04/15 08/04/15  Maryellen Pile, MD  HYDROcodone-homatropine [facility]) 5-1.5 MG/5ML syrup Take 5 mLs by mouth every 6 (six) hours as needed for cough. 09/06/15   Melony Overly, MD  ibuprofen (ADVIL,MOTRIN) 800 MG tablet Take 1 tablet (800 mg total) by mouth 3 (three) times daily. 05/04/15   Maryellen Pile, MD  neomycin-polymyxin-hydrocortisone (CORTISPORIN) 3.5-10000-1 otic suspension Place 4 drops into the left ear 3 (three) times daily. For 5 days 09/06/15   Melony Overly, MD  predniSONE (DELTASONE) 50 MG tablet Take 1 pill daily for 5 days. 09/06/15   Melony Overly, MD   Meds Ordered and Administered this Visit   Medications  ibuprofen (ADVIL,MOTRIN) tablet 800 mg (800 mg Oral Given 09/06/15 1532)  ipratropium-albuterol (DUONEB) 0.5-2.5 (3) MG/3ML nebulizer solution 3 mL (3 mLs Nebulization Given 09/06/15 1546)  HYDROcodone-acetaminophen (NORCO/VICODIN) 5-325 MG per tablet 1 tablet (1 tablet Oral Given 09/06/15 1616)    BP 118/82 mmHg  Pulse 70  Temp(Src) 98.6 F (37 C) (Oral)  Resp 20  SpO2 100% No data found.   Physical Exam  Constitutional: She is oriented to person, place, and time. She appears well-developed and well-nourished. No distress.  Appears tired and emotional  HENT:  Mouth/Throat: Oropharynx is clear and moist. No oropharyngeal exudate.  Nasal discharge present. Left cerumen impaction.  Neck: Neck supple.  Cardiovascular: Normal rate, regular rhythm and normal heart sounds.   No  murmur heard. Pulmonary/Chest: Effort normal. No respiratory distress. She has wheezes (worse in the bases). She has no rales.  Neurological: She is alert and oriented to person, place, and time.    ED Course  Procedures (including critical care time)  Labs Review Labs Reviewed - No data to display  Imaging Review No results found.    MDM   1. Bronchitis   2. Cerumen impaction, left    Lungs are clear after DuoNeb. Left ear washed out. Left TM has some peripheral erythema. Light reflexes present. There is no effusion.  Treat for bronchitis with prednisone, azithromycin, albuterol, and Hycodan. Cortisporin for the left ear for the next 5 days. Return precautions reviewed.    Melony Overly, MD 09/06/15 [id number]  Melony Overly, MD 10/09/15 1308

## 2015-09-06 NOTE — ED Notes (Signed)
C/O body aches, cough, sore throat (sore throat has resolved), feeling cold x 4 days.  Pt has been under significant stress - 3 loved ones have passed away this week.  Has been taking Nyquil and Robitussin.  Unsure if fevers.

## 2015-09-06 NOTE — Discharge Instructions (Signed)
You have bronchitis. Take azithromycin and prednisone as prescribed. Use hycodan as needed for cough. Use the albuterol every 4 hours as needed for wheezing or cough. You should see improvement in the next 3-5 days. If you develop fevers, difficulty breathing, or are just not getting better, please come back or go to the emergency room.  We removed a lot of wax from her left ear. The ear canal is a little irritated. Use the Cortisporin drops 5 days.

## 2015-10-19 ENCOUNTER — Emergency Department (HOSPITAL_COMMUNITY): Payer: PRIVATE HEALTH INSURANCE

## 2015-10-19 ENCOUNTER — Encounter (HOSPITAL_COMMUNITY): Payer: Self-pay | Admitting: Emergency Medicine

## 2015-10-19 ENCOUNTER — Observation Stay (HOSPITAL_COMMUNITY)
Admission: EM | Admit: 2015-10-19 | Discharge: 2015-10-20 | Disposition: A | Discharge status: Home / Self Care | Payer: PRIVATE HEALTH INSURANCE | Attending: Internal Medicine | Admitting: Internal Medicine

## 2015-10-19 DIAGNOSIS — G43109 Migraine with aura, not intractable, without status migrainosus: Principal | ICD-10-CM | POA: Diagnosis present

## 2015-10-19 DIAGNOSIS — Z720 Tobacco use: Secondary | ICD-10-CM | POA: Diagnosis present

## 2015-10-19 DIAGNOSIS — J329 Chronic sinusitis, unspecified: Secondary | ICD-10-CM | POA: Diagnosis not present

## 2015-10-19 DIAGNOSIS — Z79899 Other long term (current) drug therapy: Secondary | ICD-10-CM | POA: Diagnosis not present

## 2015-10-19 DIAGNOSIS — R002 Palpitations: Secondary | ICD-10-CM | POA: Diagnosis not present

## 2015-10-19 DIAGNOSIS — E785 Hyperlipidemia, unspecified: Secondary | ICD-10-CM | POA: Diagnosis present

## 2015-10-19 DIAGNOSIS — Z7982 Long term (current) use of aspirin: Secondary | ICD-10-CM | POA: Diagnosis not present

## 2015-10-19 DIAGNOSIS — F1290 Cannabis use, unspecified, uncomplicated: Secondary | ICD-10-CM | POA: Diagnosis present

## 2015-10-19 DIAGNOSIS — J45909 Unspecified asthma, uncomplicated: Secondary | ICD-10-CM | POA: Diagnosis not present

## 2015-10-19 DIAGNOSIS — Z8673 Personal history of transient ischemic attack (TIA), and cerebral infarction without residual deficits: Secondary | ICD-10-CM | POA: Diagnosis not present

## 2015-10-19 DIAGNOSIS — F17210 Nicotine dependence, cigarettes, uncomplicated: Secondary | ICD-10-CM | POA: Insufficient documentation

## 2015-10-19 DIAGNOSIS — Z8249 Family history of ischemic heart disease and other diseases of the circulatory system: Secondary | ICD-10-CM | POA: Insufficient documentation

## 2015-10-19 DIAGNOSIS — R51 Headache: Secondary | ICD-10-CM | POA: Diagnosis present

## 2015-10-19 DIAGNOSIS — G459 Transient cerebral ischemic attack, unspecified: Secondary | ICD-10-CM | POA: Diagnosis present

## 2015-10-19 DIAGNOSIS — R5381 Other malaise: Secondary | ICD-10-CM

## 2015-10-19 DIAGNOSIS — F17200 Nicotine dependence, unspecified, uncomplicated: Secondary | ICD-10-CM | POA: Diagnosis present

## 2015-10-19 DIAGNOSIS — R5383 Other fatigue: Secondary | ICD-10-CM

## 2015-10-19 LAB — URINE MICROSCOPIC-ADD ON

## 2015-10-19 LAB — URINALYSIS, ROUTINE W REFLEX MICROSCOPIC
Bilirubin Urine: NEGATIVE
Glucose, UA: NEGATIVE mg/dL
Ketones, ur: NEGATIVE mg/dL
Leukocytes, UA: NEGATIVE
Nitrite: NEGATIVE
PROTEIN: NEGATIVE mg/dL
SPECIFIC GRAVITY, URINE: 1.027 (ref 1.005–1.030)
pH: 6 (ref 5.0–8.0)

## 2015-10-19 LAB — CBC WITH DIFFERENTIAL/PLATELET
BASOS ABS: 0 10*3/uL (ref 0.0–0.1)
BASOS PCT: 0 %
EOS PCT: 2 %
Eosinophils Absolute: 0.1 10*3/uL (ref 0.0–0.7)
HEMATOCRIT: 41.8 % (ref 36.0–46.0)
Hemoglobin: 13.7 g/dL (ref 12.0–15.0)
Lymphocytes Relative: 59 %
Lymphs Abs: 3.1 10*3/uL (ref 0.7–4.0)
MCH: 30.2 pg (ref 26.0–34.0)
MCHC: 32.8 g/dL (ref 30.0–36.0)
MCV: 92.3 fL (ref 78.0–100.0)
MONO ABS: 0.5 10*3/uL (ref 0.1–1.0)
MONOS PCT: 9 %
Neutro Abs: 1.6 10*3/uL — ABNORMAL LOW (ref 1.7–7.7)
Neutrophils Relative %: 30 %
PLATELETS: 220 10*3/uL (ref 150–400)
RBC: 4.53 MIL/uL (ref 3.87–5.11)
RDW: 12.3 % (ref 11.5–15.5)
WBC: 5.3 10*3/uL (ref 4.0–10.5)

## 2015-10-19 LAB — COMPREHENSIVE METABOLIC PANEL
ALBUMIN: 3.7 g/dL (ref 3.5–5.0)
ALT: 9 U/L — ABNORMAL LOW (ref 14–54)
ANION GAP: 12 (ref 5–15)
AST: 22 U/L (ref 15–41)
Alkaline Phosphatase: 61 U/L (ref 38–126)
BILIRUBIN TOTAL: 0.7 mg/dL (ref 0.3–1.2)
BUN: 14 mg/dL (ref 6–20)
CHLORIDE: 106 mmol/L (ref 101–111)
CO2: 23 mmol/L (ref 22–32)
Calcium: 9.3 mg/dL (ref 8.9–10.3)
Creatinine, Ser: 0.81 mg/dL (ref 0.44–1.00)
GFR calc Af Amer: >60 mL/min (ref >60)
GLUCOSE: 98 mg/dL (ref 65–99)
POTASSIUM: 4 mmol/L (ref 3.5–5.1)
Sodium: 141 mmol/L (ref 135–145)
TOTAL PROTEIN: 7 g/dL (ref 6.5–8.1)

## 2015-10-19 NOTE — ED Provider Notes (Signed)
CSN: [id number]     Arrival date & time 10/19/15  2020 History   First MD Initiated Contact with Patient 10/19/15 2208     Chief Complaint  Patient presents with  . Headache  . Emesis  . Diarrhea  . Numbness  . Weakness  . Fatigue     (Consider location/radiation/quality/duration/timing/severity/associated sxs/prior Treatment) The history is provided by the patient and medical records. No language interpreter was used.     Samantha [name] is a 40 y.o. female  with a hx of TIA presents to the Emergency Department complaining of gradual, persistent, progressively worsening chills, feeling hot, vomiting x1 (stomach contents), 1 episode of loose stool, headache, generalized body aches, fatigue onset this morning.  Pt reports she had right face and arm paresthesias onset 7:20pm and lasting 20 min then totally resolved and has not returned.  Pt reports diplopia around 7:00pm and resolved with the paresthesia.  Pt reports no sick contacts.  Pt reports previous CVA in Summer 2016 and was dx due to memory loss. No aggravating or alleviating factors.  No treatments PTA.  Pt denies fever, neck pain, chest pain, SOB, abd pain, weakness, dizziness, syncope, dysuria, hematuria.  Pt reports she was taking an unknown blood thinner but ran out several months ago and has not refilled the Rx.  She does not have a PCP.  Headache is throbbing in nature, on the left side of her head and behind her left eye.    Record review shows that patient was admitted in August 2016 for TIA symptoms. At that time, her MRI was normal. Patient followed up with internal medicine teaching service. It appears as if she was discharged on aspirin however she is not taking this.   Past Medical History  Diagnosis Date  . Back pain   . Medical history non-contributory   . Stroke ([name])   . TIA (transient ischemic attack)    Past Surgical History  Procedure Laterality Date  . Leep     Family History  Problem Relation Age of Onset   . Anesthesia problems Neg Hx   . Other Neg Hx    Social History  Substance Use Topics  . Smoking status: Current Every Day Smoker -- 1.00 packs/day    Last Attempt to Quit: 12/25/2014  . Smokeless tobacco: None  . Alcohol Use: No   OB History    Gravida Para Term Preterm AB TAB SAB Ectopic Multiple Living   2    1  1   1      Review of Systems  Constitutional: Positive for fatigue. Negative for fever, diaphoresis, appetite change and unexpected weight change.  HENT: Negative for mouth sores.   Eyes: Negative for visual disturbance.  Respiratory: Negative for cough, chest tightness, shortness of breath and wheezing.   Cardiovascular: Negative for chest pain.  Gastrointestinal: Positive for vomiting and diarrhea. Negative for nausea, abdominal pain and constipation.  Endocrine: Negative for polydipsia, polyphagia and polyuria.  Genitourinary: Negative for dysuria, urgency, frequency and hematuria.  Musculoskeletal: Positive for myalgias. Negative for back pain and neck stiffness.  Skin: Negative for rash.  Allergic/Immunologic: Negative for immunocompromised state.  Neurological: Positive for weakness and headaches. Negative for syncope and light-headedness.  Hematological: Does not bruise/bleed easily.  Psychiatric/Behavioral: Negative for sleep disturbance. The patient is not nervous/anxious.       Allergies  Review of patient's allergies indicates no known allergies.  Home Medications   Prior to Admission medications   Medication Sig Start Date End  Date Taking? Authorizing Provider  albuterol (PROVENTIL HFA;VENTOLIN HFA) 108 (90 Base) MCG/ACT inhaler Inhale 2 puffs into the lungs every 4 (four) hours as needed for wheezing or shortness of breath (cough). 09/06/15  Yes Melony Overly, MD  MedroxyPROGESTERone Acetate (DEPO-PROVERA IM) Inject into the muscle. Patient unsure of dose   Yes Historical Provider, MD  aspirin EC 81 MG EC tablet Take 1 tablet (81 mg total) by mouth  daily. To prevent another stroke Patient not taking: Reported on 10/19/2015 04/22/15   Loleta Chance, MD  atorvastatin (LIPITOR) 40 MG tablet Take 1 tablet (40 mg total) by mouth daily at 6 PM. To keep cholesterol levels low Patient not taking: Reported on 10/19/2015 04/22/15   Loleta Chance, MD  azithromycin (ZITHROMAX Z-PAK) 250 MG tablet Take 2 pills today, then 1 pill daily until gone. Patient not taking: Reported on 10/19/2015 09/06/15   Melony Overly, MD  buPROPion [facility] SR) 150 MG 12 hr tablet qdaily x 3 days, then bid 05/04/15 08/04/15  Maryellen Pile, MD  HYDROcodone-homatropine [facility]) 5-1.5 MG/5ML syrup Take 5 mLs by mouth every 6 (six) hours as needed for cough. Patient not taking: Reported on 10/19/2015 09/06/15   Melony Overly, MD  ibuprofen (ADVIL,MOTRIN) 800 MG tablet Take 1 tablet (800 mg total) by mouth 3 (three) times daily. Patient not taking: Reported on 10/19/2015 05/04/15   Maryellen Pile, MD  neomycin-polymyxin-hydrocortisone (CORTISPORIN) 3.5-10000-1 otic suspension Place 4 drops into the left ear 3 (three) times daily. For 5 days Patient not taking: Reported on 10/19/2015 09/06/15   Melony Overly, MD  predniSONE (DELTASONE) 50 MG tablet Take 1 pill daily for 5 days. Patient not taking: Reported on 10/19/2015 09/06/15   Melony Overly, MD   BP 101/75 mmHg  Pulse 63  Temp(Src) 98.9 F (37.2 C) (Oral)  Resp 18  Ht 5\' 4"  (1.626 m)  Wt 62.795 kg  BMI 23.75 kg/m2  SpO2 98% Physical Exam  Constitutional: She is oriented to person, place, and time. She appears well-developed and well-nourished. No distress.  HENT:  Head: Normocephalic and atraumatic.  Right Ear: Tympanic membrane, external ear and ear canal normal.  Left Ear: Tympanic membrane, external ear and ear canal normal.  Nose: Nose normal. No epistaxis. Right sinus exhibits no maxillary sinus tenderness and no frontal sinus tenderness. Left sinus exhibits no maxillary sinus tenderness and no frontal sinus tenderness.   Mouth/Throat: Uvula is midline, oropharynx is clear and moist and mucous membranes are normal. Mucous membranes are not pale and not cyanotic. No oropharyngeal exudate, posterior oropharyngeal edema, posterior oropharyngeal erythema or tonsillar abscesses.  Eyes: Conjunctivae and EOM are normal. Pupils are equal, round, and reactive to light. No scleral icterus.  No horizontal, vertical or rotational nystagmus  Neck: Normal range of motion and full passive range of motion without pain. Neck supple.  Full active and passive ROM without pain No midline or paraspinal tenderness No nuchal rigidity or meningeal signs  Cardiovascular: Normal rate, regular rhythm, normal heart sounds and intact distal pulses.   Pulmonary/Chest: Effort normal and breath sounds normal. No stridor. No respiratory distress. She has no wheezes. She has no rales.  Clear and equal breath sounds without focal wheezes, rhonchi, rales  Abdominal: Soft. Bowel sounds are normal. There is no tenderness. There is no rebound and no guarding.  Musculoskeletal: Normal range of motion.  Lymphadenopathy:    She has no cervical adenopathy.  Neurological: She is alert and oriented to person, place, and time.  She has normal reflexes. No cranial nerve deficit. She exhibits normal muscle tone. Coordination normal.  Mental Status:  Alert, oriented, thought content appropriate. Speech fluent without evidence of aphasia. Able to follow 2 step commands without difficulty.  Cranial Nerves:  II:  Peripheral visual fields grossly normal, pupils equal, round, reactive to light III,IV, VI: ptosis not present, extra-ocular motions intact bilaterally  V,VII: smile symmetric, facial light touch sensation equal VIII: hearing grossly normal bilaterally  IX,X: midline uvula rise  XI: bilateral shoulder shrug equal and strong XII: midline tongue extension  Motor:  5/5 in upper and lower extremities bilaterally including strong and equal grip strength  and dorsiflexion/plantar flexion Sensory: Pinprick and light touch normal in all extremities.  Deep Tendon Reflexes: 2+ and symmetric  Cerebellar: normal finger-to-nose with bilateral upper extremities Gait: normal gait and balance CV: distal pulses palpable throughout   Skin: Skin is warm and dry. No rash noted. She is not diaphoretic.  Psychiatric: She has a normal mood and affect. Her behavior is normal. Judgment and thought content normal.  Nursing note and vitals reviewed.   ED Course  Procedures (including critical care time) Labs Review Labs Reviewed  CBC WITH DIFFERENTIAL/PLATELET - Abnormal; Notable for the following:    Neutro Abs 1.6 (*)    All other components within normal limits  COMPREHENSIVE METABOLIC PANEL - Abnormal; Notable for the following:    ALT 9 (*)    All other components within normal limits  URINALYSIS, ROUTINE W REFLEX MICROSCOPIC (NOT AT [facility]) - Abnormal; Notable for the following:    Hgb urine dipstick MODERATE (*)    All other components within normal limits  URINE MICROSCOPIC-ADD ON - Abnormal; Notable for the following:    Squamous Epithelial / LPF 0-5 (*)    Bacteria, UA RARE (*)    All other components within normal limits  POC URINE PREG, ED    Imaging Review Ct Head Wo Contrast  10/20/2015  CLINICAL DATA:  Stroke-like symptoms. Numbness in arms and face. Diplopia. Symptoms have now resolved. EXAM: CT HEAD WITHOUT CONTRAST TECHNIQUE: Contiguous axial images were obtained from the base of the skull through the vertex without intravenous contrast. COMPARISON:  MRI brain 04/21/2015.  CT head 04/21/2015. FINDINGS: No evidence for acute infarction, hemorrhage, mass lesion, hydrocephalus, or extra-axial fluid. Normal cerebral volume. No white matter disease. Calvarium intact. Chronic and acute sinus disease in the LEFT maxillary and LEFT ethmoid regions, incompletely evaluated. No mastoid or middle ear fluid. Similar sinus pathology on the previous exam.  IMPRESSION: CT head shows no acute or focal intracranial abnormality. LEFT ethmoid and maxillary sinus disease, incompletely evaluated, but with suggestion of layering fluid in the LEFT maxillary sinus. Electronically Signed   By: Staci Righter M.D.   On: 10/20/2015 00:01   I have personally reviewed and evaluated these images and lab results as part of my medical decision-making.    MDM   Final diagnoses:  Transient cerebral ischemia, unspecified transient cerebral ischemia type  Malaise and fatigue   Fantasy Compitello presents with numerous complaints however diplopia, right face and right arm paresthesias we did concern of possible TIA. Labs are reassuring. Vital signs are stable.  12:29 AM CT without acute abnormality.  Pt admitted to internal medicine teaching service for TIA work-up as she has been off her ASA.  BP 101/75 mmHg  Pulse 63  Temp(Src) 98.9 F (37.2 C) (Oral)  Resp 18  Ht 5\' 4"  (1.626 m)  Wt 62.795 kg  BMI 23.75 kg/m2  SpO2 98%   Abigail Butts, PA-C 10/20/15 [id number]  Leonard Schwartz, MD 10/20/15 1459

## 2015-10-19 NOTE — ED Notes (Signed)
Pt. presents with multiple complaints : headache , nausea and emesis , diarrhea , generalized weakness , fatigue , " not feeling well / feels tired " , brief numbness at right face and right arm and generalized body aches  onset today . Alert and oriented , denies fever or chills , respirations unlabored , speech clear with no facial asymmetry , equal strong grips with no arm drift/ambulatory. Samantha [name]

## 2015-10-20 ENCOUNTER — Observation Stay (HOSPITAL_COMMUNITY): Payer: PRIVATE HEALTH INSURANCE

## 2015-10-20 ENCOUNTER — Encounter (HOSPITAL_COMMUNITY): Payer: Self-pay | Admitting: Internal Medicine

## 2015-10-20 DIAGNOSIS — J329 Chronic sinusitis, unspecified: Secondary | ICD-10-CM | POA: Diagnosis present

## 2015-10-20 DIAGNOSIS — F1290 Cannabis use, unspecified, uncomplicated: Secondary | ICD-10-CM | POA: Diagnosis present

## 2015-10-20 DIAGNOSIS — G43109 Migraine with aura, not intractable, without status migrainosus: Secondary | ICD-10-CM | POA: Diagnosis present

## 2015-10-20 DIAGNOSIS — F17200 Nicotine dependence, unspecified, uncomplicated: Secondary | ICD-10-CM

## 2015-10-20 DIAGNOSIS — E785 Hyperlipidemia, unspecified: Secondary | ICD-10-CM

## 2015-10-20 LAB — RAPID URINE DRUG SCREEN, HOSP PERFORMED
Amphetamines: NOT DETECTED
BARBITURATES: NOT DETECTED
Benzodiazepines: NOT DETECTED
Cocaine: NOT DETECTED
Opiates: NOT DETECTED
TETRAHYDROCANNABINOL: POSITIVE — AB

## 2015-10-20 LAB — LIPID PANEL
CHOLESTEROL: 187 mg/dL (ref 0–200)
HDL: 55 mg/dL (ref >40)
LDL Cholesterol: 121 mg/dL — ABNORMAL HIGH (ref 0–99)
TRIGLYCERIDES: 53 mg/dL (ref <150)
Total CHOL/HDL Ratio: 3.4 RATIO
VLDL: 11 mg/dL (ref 0–40)

## 2015-10-20 LAB — MAGNESIUM: MAGNESIUM: 1.9 mg/dL (ref 1.7–2.4)

## 2015-10-20 LAB — TSH: TSH: 2.14 u[IU]/mL (ref 0.350–4.500)

## 2015-10-20 LAB — HIV ANTIBODY (ROUTINE TESTING W REFLEX): HIV Screen 4th Generation wRfx: NONREACTIVE

## 2015-10-20 LAB — ETHANOL

## 2015-10-20 LAB — MRSA PCR SCREENING: MRSA BY PCR: NEGATIVE

## 2015-10-20 MED ORDER — VERAPAMIL HCL 40 MG PO TABS
40.0000 mg | ORAL_TABLET | Freq: Two times a day (BID) | ORAL | Status: DC
  Administered 2015-10-20: 40 mg via ORAL
  Filled 2015-10-20 (×2): qty 1

## 2015-10-20 MED ORDER — AMOXICILLIN-POT CLAVULANATE 875-125 MG PO TABS: 1.0000 | ORAL_TABLET | Freq: Two times a day (BID) | ORAL | Status: DC

## 2015-10-20 MED ORDER — NICOTINE 21 MG/24HR TD PT24: 21.0000 mg | MEDICATED_PATCH | Freq: Every day | TRANSDERMAL | Status: DC

## 2015-10-20 MED ORDER — ONDANSETRON HCL 4 MG/2ML IJ SOLN: 4.0000 mg | Freq: Four times a day (QID) | INTRAMUSCULAR | Status: DC | PRN

## 2015-10-20 MED ORDER — ATORVASTATIN CALCIUM 40 MG PO TABS
40.0000 mg | ORAL_TABLET | Freq: Every day | ORAL | Status: DC
  Administered 2015-10-20: 40 mg via ORAL
  Filled 2015-10-20: qty 1

## 2015-10-20 MED ORDER — ENOXAPARIN SODIUM 40 MG/0.4ML ~~LOC~~ SOLN
40.0000 mg | SUBCUTANEOUS | Status: DC
  Administered 2015-10-20: 40 mg via SUBCUTANEOUS
  Filled 2015-10-20: qty 0.4

## 2015-10-20 MED ORDER — FLUTICASONE PROPIONATE 50 MCG/ACT NA SUSP
2.0000 | Freq: Every day | NASAL | Status: DC
  Administered 2015-10-20: 2 via NASAL
  Filled 2015-10-20: qty 16

## 2015-10-20 MED ORDER — SODIUM CHLORIDE 0.9 % IV SOLN: INTRAVENOUS | Status: AC

## 2015-10-20 MED ORDER — NICOTINE 21 MG/24HR TD PT24
21.0000 mg | MEDICATED_PATCH | Freq: Every day | TRANSDERMAL | Status: DC
  Administered 2015-10-20: 21 mg via TRANSDERMAL
  Filled 2015-10-20: qty 1

## 2015-10-20 MED ORDER — STROKE: EARLY STAGES OF RECOVERY BOOK
Freq: Once | Status: AC
  Administered 2015-10-20: 03:00:00
  Filled 2015-10-20: qty 1

## 2015-10-20 MED ORDER — ACETAMINOPHEN 325 MG PO TABS
650.0000 mg | ORAL_TABLET | Freq: Four times a day (QID) | ORAL | Status: DC | PRN
  Administered 2015-10-20: 650 mg via ORAL
  Filled 2015-10-20: qty 2

## 2015-10-20 MED ORDER — ALBUTEROL SULFATE (2.5 MG/3ML) 0.083% IN NEBU: 5.0000 mg | INHALATION_SOLUTION | Freq: Four times a day (QID) | RESPIRATORY_TRACT | Status: DC | PRN

## 2015-10-20 MED ORDER — ASPIRIN EC 81 MG PO TBEC
81.0000 mg | DELAYED_RELEASE_TABLET | Freq: Every day | ORAL | Status: DC
  Administered 2015-10-20: 81 mg via ORAL
  Filled 2015-10-20: qty 1

## 2015-10-20 MED ORDER — ASPIRIN 300 MG RE SUPP: 300.0000 mg | Freq: Once | RECTAL | Status: AC

## 2015-10-20 MED ORDER — VERAPAMIL HCL 40 MG PO TABS
40.0000 mg | ORAL_TABLET | Freq: Two times a day (BID) | ORAL | Status: DC
Start: 2015-10-20 — End: 2016-06-26

## 2015-10-20 MED ORDER — SENNOSIDES-DOCUSATE SODIUM 8.6-50 MG PO TABS
1.0000 | ORAL_TABLET | Freq: Every evening | ORAL | Status: DC | PRN
  Filled 2015-10-20: qty 1

## 2015-10-20 MED ORDER — ASPIRIN 325 MG PO TABS
325.0000 mg | ORAL_TABLET | Freq: Once | ORAL | Status: AC
  Administered 2015-10-20: 325 mg via ORAL
  Filled 2015-10-20: qty 1

## 2015-10-20 NOTE — Discharge Instructions (Signed)
Samantha [name], it was a pleasure taking care of you in the hospital. Once again, there is no sign you had a stroke. Your symptoms are consistent with an extremely severe migraine called "complicated migraine." To treat this, we will start with a low dose of Verapamil 40 mg twice a day; however, this dose can be significantly increased if necessary. The goal would be to reduce the frequency of these headaches.   I have made a follow up appointment for you with [facility] at [facility] Day (2/14) to help treat this type of severe headache. I have made a back up appointment in our general medicine clinic on 2/16.

## 2015-10-20 NOTE — Care Management Note (Signed)
Case Management Note  Patient Details  Name: Samantha [name] MRN: [id number] Date of Birth: 1976/04/02  Subjective/Objective:  Patient admitted with TIA. MRI results negative. Patient is from home with family.                Action/Plan: Awaiting further testing and PT/OT recommendations. CM following for d/c needs.  Expected Discharge Date:                  Expected Discharge Plan:     In-House Referral:     Discharge planning Services     Post Acute Care Choice:    Choice offered to:     DME Arranged:    DME Agency:     HH Arranged:    HH Agency:     Status of Service:  In process, will continue to follow  Medicare Important Message Given:    Date Medicare IM Given:    Medicare IM give by:    Date Additional Medicare IM Given:    Additional Medicare Important Message give by:     If discussed at [place] of Stay Meetings, dates discussed:    Additional Comments:  Pollie Friar, RN 10/20/2015, 10:55 AM

## 2015-10-20 NOTE — Discharge Summary (Signed)
Name: Samantha [name] MRN: [id number] DOB: [birth year] 40 y.o. PCP: Provider Default, MD  Date of Admission: 10/19/2015  9:58 PM Date of Discharge: 10/20/2015 Attending Physician: Oval Linsey, MD  Discharge Diagnosis: Active Problem: 1. Complex Migraine Chronic Problems: 2. Tobacco Use Disorder 3. Asthma 4. Hyperlipidemia 5. Sinusitis  Discharge Medications:   Medication List    STOP taking these medications        ibuprofen 800 MG tablet  Commonly known as:  ADVIL,MOTRIN      TAKE these medications        albuterol 108 (90 Base) MCG/ACT inhaler  Commonly known as:  PROVENTIL HFA;VENTOLIN HFA  Inhale 2 puffs into the lungs every 4 (four) hours as needed for wheezing or shortness of breath (cough).     aspirin 81 MG EC tablet  Take 1 tablet (81 mg total) by mouth daily.      atorvastatin 40 MG tablet  Commonly known as:  LIPITOR  Take 1 tablet (40 mg total) by mouth daily at 6 PM. To keep cholesterol levels low     buPROPion 150 MG 12 hr tablet  Commonly known as:  WELLBUTRIN SR  qdaily x 3 days, then bid     DEPO-PROVERA IM  Inject into the muscle. Patient unsure of dose     nicotine 21 mg/24hr patch  Commonly known as:  NICODERM CQ - dosed in mg/24 hours  Place 1 patch (21 mg total) onto the skin daily.     verapamil 40 MG tablet  Commonly known as:  CALAN  Take 1 tablet (40 mg total) by mouth 2 (two) times daily.        Disposition and follow-up:   SamanthaSamantha [name] was discharged from [facility] in good condition.  At the hospital follow up visit please address:  1.  Patient was admitted for a severe headache as described below and transient focal neurological deficits that had resolved by admission. She has had extensive workup of previous episodes for an ischemic etiology, including 30 day cardiac monitoring and multiple normal brain MRIs. Her her concomitant pulsatile, unilateral headache is far more consistent with complex migraine. In  the setting of asthma and wheezing noted on exam, it was concluded the propranolol would be a poor option for prophylaxis. A low dose of verapamil 40 mg BID was initiated with room to titrate up if her blood pressure can tolerate it. 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   2.  Labs / imaging needed at time of follow-up: None  3.  Pending labs/ test needing follow-up: None  Follow-up Appointments:     Follow-up Information    Follow up with Lenor Coffin, MD On 10/25/2015.   Specialty:  Neurology   Why:  Arrive at 1:30 pm.   Contact information:   [address] [place] [address] [phone]       Follow up with Lucious Groves, DO. Go on 10/27/2015.   Specialty:  Internal Medicine   Why:  at 3:15 pm.   Contact information:   [place] [address] [phone]       Discharge Instructions: Discharge Instructions    Ambulatory referral to Neurology    Complete by:  As directed   An appointment is requested in approximately: Already has an appointment on 2/14.     Diet - low sodium heart healthy  Complete by:  As directed      Increase activity slowly    Complete by:  As directed           Samantha [name], it was a pleasure taking care of you in the hospital. Once again, there is no sign you had a stroke. Your symptoms are consistent with an extremely severe migraine called "complicated migraine." To treat this, we will start with a low dose of Verapamil 40 mg twice a day; however, this dose can be significantly increased if necessary. The goal would be to reduce the frequency of these headaches.   I have made a follow up appointment for you with [facility] at Valentine's Day (2/14) to help treat this type of severe headache. I have made a back up appointment in our general medicine clinic on 2/16.  Procedures Performed:  Dg  Chest 2 View  10/20/2015  CLINICAL DATA:  40 year old female with transient ischemic attack, and weakness. EXAM: CHEST  2 VIEW COMPARISON:  Radiograph dated 04/21/2015 FINDINGS: The heart size and mediastinal contours are within normal limits. Both lungs are clear. The visualized skeletal structures are unremarkable. Multiple small metallic densities noted over the posterior thoracic wall similar to prior study. IMPRESSION: No active cardiopulmonary disease. Electronically Signed   By: Anner Crete M.D.   On: 10/20/2015 01:52   Ct Head Wo Contrast  10/20/2015  CLINICAL DATA:  Stroke-like symptoms. Numbness in arms and face. Diplopia. Symptoms have now resolved. EXAM: CT HEAD WITHOUT CONTRAST TECHNIQUE: Contiguous axial images were obtained from the base of the skull through the vertex without intravenous contrast. COMPARISON:  MRI brain 04/21/2015.  CT head 04/21/2015. FINDINGS: No evidence for acute infarction, hemorrhage, mass lesion, hydrocephalus, or extra-axial fluid. Normal cerebral volume. No white matter disease. Calvarium intact. Chronic and acute sinus disease in the LEFT maxillary and LEFT ethmoid regions, incompletely evaluated. No mastoid or middle ear fluid. Similar sinus pathology on the previous exam. IMPRESSION: CT head shows no acute or focal intracranial abnormality. LEFT ethmoid and maxillary sinus disease, incompletely evaluated, but with suggestion of layering fluid in the LEFT maxillary sinus. Electronically Signed   By: Staci Righter M.D.   On: 10/20/2015 00:01   Mr Brain Wo Contrast  10/20/2015  CLINICAL DATA:  40 year old female with headache, right hand numbness progressing to right shoulder and face numbness, and diplopia at 1815 hours yesterday. Symptoms other than headache resolved. Initial encounter. EXAM: MRI HEAD WITHOUT CONTRAST MRA HEAD WITHOUT CONTRAST TECHNIQUE: Multiplanar, multiecho pulse sequences of the brain and surrounding structures were obtained without  intravenous contrast. Angiographic images of the head were obtained using MRA technique without contrast. COMPARISON:  Head CT without contrast 10/19/2015. Brain MRI and intracranial MRA 04/21/2015 FINDINGS: MRI HEAD FINDINGS Major intracranial vascular flow voids are stable. Cerebral volume remains normal. Partially empty sella re- demonstrated and unchanged. No restricted diffusion to suggest acute infarction. No midline shift, mass effect, evidence of mass lesion, ventriculomegaly, extra-axial collection or acute intracranial hemorrhage. Cervicomedullary junction within normal limits. Negative visualized cervical spine. Pear[place] Cables and white matter signal is within normal limits for age throughout the brain. No chronic cerebral blood products. Visible internal auditory structures appear normal. Mastoids are clear. Chronic but increased left maxillary sinus inflammatory changes with combined fluid and mucous retention cysts. Chronic ethmoid sinus and frontoethmoidal recess opacification appears stable. Other paranasal sinuses are clear. Negative orbit and scalp soft tissues. Stable bone marrow signal. MRA HEAD FINDINGS Stable antegrade flow in the posterior  circulation with codominant distal vertebral arteries. PICA origins remain patent. Normal vertebrobasilar junction. No basilar stenosis. Normal SCA and PCA origins. Left posterior communicating artery is present. Bilateral PCA branches are within normal limits. Stable antegrade flow in both ICA siphons. Normal ophthalmic and left posterior communicating artery origins. Normal carotid termini, MCA and ACA origins. Diminutive or absent anterior communicating artery. Visualized bilateral ACA branches are within normal limits. Visualized bilateral MCA branches are stable and within normal limits. IMPRESSION: 1. Stable and normal for age noncontrast MRI appearance of the brain. 2. Stable and normal intracranial MRA. 3. Acute on chronic left paranasal sinusitis, mildly  progressed since August. Electronically Signed   By: Genevie Ann M.D.   On: 10/20/2015 10:24   Mr Jodene Nam Head/brain Wo Cm  10/20/2015  CLINICAL DATA:  40 year old female with headache, right hand numbness progressing to right shoulder and face numbness, and diplopia at 1815 hours yesterday. Symptoms other than headache resolved. Initial encounter. EXAM: MRI HEAD WITHOUT CONTRAST MRA HEAD WITHOUT CONTRAST TECHNIQUE: Multiplanar, multiecho pulse sequences of the brain and surrounding structures were obtained without intravenous contrast. Angiographic images of the head were obtained using MRA technique without contrast. COMPARISON:  Head CT without contrast 10/19/2015. Brain MRI and intracranial MRA 04/21/2015 FINDINGS: MRI HEAD FINDINGS Major intracranial vascular flow voids are stable. Cerebral volume remains normal. Partially empty sella re- demonstrated and unchanged. No restricted diffusion to suggest acute infarction. No midline shift, mass effect, evidence of mass lesion, ventriculomegaly, extra-axial collection or acute intracranial hemorrhage. Cervicomedullary junction within normal limits. Negative visualized cervical spine. [place] Cables and white matter signal is within normal limits for age throughout the brain. No chronic cerebral blood products. Visible internal auditory structures appear normal. Mastoids are clear. Chronic but increased left maxillary sinus inflammatory changes with combined fluid and mucous retention cysts. Chronic ethmoid sinus and frontoethmoidal recess opacification appears stable. Other paranasal sinuses are clear. Negative orbit and scalp soft tissues. Stable bone marrow signal. MRA HEAD FINDINGS Stable antegrade flow in the posterior circulation with codominant distal vertebral arteries. PICA origins remain patent. Normal vertebrobasilar junction. No basilar stenosis. Normal SCA and PCA origins. Left posterior communicating artery is present. Bilateral PCA branches are within normal limits.  Stable antegrade flow in both ICA siphons. Normal ophthalmic and left posterior communicating artery origins. Normal carotid termini, MCA and ACA origins. Diminutive or absent anterior communicating artery. Visualized bilateral ACA branches are within normal limits. Visualized bilateral MCA branches are stable and within normal limits. IMPRESSION: 1. Stable and normal for age noncontrast MRI appearance of the brain. 2. Stable and normal intracranial MRA. 3. Acute on chronic left paranasal sinusitis, mildly progressed since August. Electronically Signed   By: Genevie Ann M.D.   On: 10/20/2015 10:24    Admission HPI: 40 y/o woman with PMHx of TIA presents to ED after waking up from a nap around 6:15pm with headache, then developed diplopia and R hand numbness that progressed to shoulder and R face, which resolved approximately 20 minutes later. This numbness was accompanied with subjective weakness but not witnessed. She denies any LOC or involuntary movements. Afterwards all symptoms were improved except for left sided throbbing headache, photophobia, and nausea which were present throughout the episode.  Earlier today she reports feeling fever and chills, one episode of vomiting, and body aches since waking up this morning. These symptoms caused her to go home from work and take a nap in the afternoon. She was previously feeling at baseline. She states she does not  have frequent headaches and denies any personal or family history of migraine. However on chart review she was having several recurrent headaches after her hospitalization in August.  Her home mediations are lipitor and aspirin but she was not taking these due to running out. These were prescribed after a previous episode in 04/2015 that was diagnosed as TIA after full workup. She continues to smoke 1 ppd cigarettes. She uses depo-medrol for contraception. She does have a strong family history of atherosclerotic disease including MI in sister at 79  y/o.  Hospital Course by problem list:  Complex Migraine: Given that her headaches were pulsatile, lasted at least 4 hours but less than 72, were unilateral, were associated with nausea and vomiting, and were disabling, she meets diagnostic criteria for migraine. The presence of focal neurologic deficits indicated that these migraines are complex in nature. Reviewing this and previous presentations, she denied seeing "stars," but was noted to see stars during her August 11 admission. For this admission, she said at first that she "saw double," but upon further inquiry, it seemed like she saw that objects had an outline, more consistent with aura. She said that her headache episodes do not occur frequently (at least twice a year), but when they do, they can be rather severe and may be associated with lacrimation. Previous ischemic workup from the 04/2015 episode showed  normal echocardiography, brain MRI, and 30 day cardiac monitoring. Brain MRI from this episode was reassuring again. Therefore, it was concluded that her symptoms were likely unrelated to a TIA or CVA, and in tandem with her other symptoms, were more consistent with complex migraine. 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 Unfortunately, her known history of asthma and wheezing on exam made propranolol migraine prophylaxis a relative contraindication. It was noted that her birth control was not estrogen-based. Verapamil 40 mg BID was initiated , which would concurrently address RCVS and possibly migraine. She was set up with [facility] for an outpatient visit.   Tobacco Use Disorder: Patient was counseled on smoking cessation and provided a nicotine patch.  Asthma: Wheezing was noted on exam. Home albuterol was available prn.  Hyperlipidemia: She was continued on Atorvastatin 40 mg daily  Sinusitis: Left  paranasal sinusitis noted on CT.   Discharge Vitals:   BP 101/56 mmHg  Pulse 74  Temp(Src) 98.8 F (37.1 C) (Oral)  Resp 16  Ht 5\' 4"  (1.626 m)  Wt 138 lb 8 oz (62.823 kg)  BMI 23.76 kg/m2  SpO2 100%  Discharge Labs:  Results for orders placed or performed during the hospital encounter of 10/19/15 (from the past 24 hour(s))  Urinalysis, Routine w reflex microscopic (not at [facility])     Status: Abnormal   Collection Time: 10/19/15  8:33 PM  Result Value Ref Range   Color, Urine YELLOW YELLOW   APPearance CLEAR CLEAR   Specific Gravity, Urine 1.027 1.005 - 1.030   pH 6.0 5.0 - 8.0   Glucose, UA NEGATIVE NEGATIVE mg/dL   Hgb urine dipstick MODERATE (A) NEGATIVE   Bilirubin Urine NEGATIVE NEGATIVE   Ketones, ur NEGATIVE NEGATIVE mg/dL   Protein, ur NEGATIVE NEGATIVE mg/dL   Nitrite NEGATIVE NEGATIVE   Leukocytes, UA NEGATIVE NEGATIVE  Urine microscopic-add on     Status: Abnormal   Collection Time: 10/19/15  8:33 PM  Result Value Ref Range   Squamous Epithelial / LPF 0-5 (A) NONE SEEN  WBC, UA 0-5 0 - 5 WBC/hpf   RBC / HPF 6-30 0 - 5 RBC/hpf   Bacteria, UA RARE (A) NONE SEEN   Urine-Other MUCOUS PRESENT   CBC with Differential     Status: Abnormal   Collection Time: 10/19/15  8:53 PM  Result Value Ref Range   WBC 5.3 4.0 - 10.5 K/uL   RBC 4.53 3.87 - 5.11 MIL/uL   Hemoglobin 13.7 12.0 - 15.0 g/dL   HCT 41.8 36.0 - 46.0 %   MCV 92.3 78.0 - 100.0 fL   MCH 30.2 26.0 - 34.0 pg   MCHC 32.8 30.0 - 36.0 g/dL   RDW 12.3 11.5 - 15.5 %   Platelets 220 150 - 400 K/uL   Neutrophils Relative % 30 %   Neutro Abs 1.6 (L) 1.7 - 7.7 K/uL   Lymphocytes Relative 59 %   Lymphs Abs 3.1 0.7 - 4.0 K/uL   Monocytes Relative 9 %   Monocytes Absolute 0.5 0.1 - 1.0 K/uL   Eosinophils Relative 2 %   Eosinophils Absolute 0.1 0.0 - 0.7 K/uL   Basophils Relative 0 %   Basophils Absolute 0.0 0.0 - 0.1 K/uL  Comprehensive metabolic panel     Status: Abnormal   Collection Time: 10/19/15  8:53 PM   Result Value Ref Range   Sodium 141 135 - 145 mmol/L   Potassium 4.0 3.5 - 5.1 mmol/L   Chloride 106 101 - 111 mmol/L   CO2 23 22 - 32 mmol/L   Glucose, Bld 98 65 - 99 mg/dL   BUN 14 6 - 20 mg/dL   Creatinine, Ser 0.81 0.44 - 1.00 mg/dL   Calcium 9.3 8.9 - 10.3 mg/dL   Total Protein 7.0 6.5 - 8.1 g/dL   Albumin 3.7 3.5 - 5.0 g/dL   AST 22 15 - 41 U/L   ALT 9 (L) 14 - 54 U/L   Alkaline Phosphatase 61 38 - 126 U/L   Total Bilirubin 0.7 0.3 - 1.2 mg/dL   GFR calc non Af Amer >60 >60 mL/min   GFR calc Af Amer >60 >60 mL/min   Anion gap 12 5 - 15  Ethanol     Status: None   Collection Time: 10/20/15  1:10 AM  Result Value Ref Range   Alcohol, Ethyl (B) <5 <5 mg/dL  Lipid panel     Status: Abnormal   Collection Time: 10/20/15  1:10 AM  Result Value Ref Range   Cholesterol 187 0 - 200 mg/dL   Triglycerides 53 <150 mg/dL   HDL 55 >40 mg/dL   Total CHOL/HDL Ratio 3.4 RATIO   VLDL 11 0 - 40 mg/dL   LDL Cholesterol 121 (H) 0 - 99 mg/dL  TSH     Status: None   Collection Time: 10/20/15  1:10 AM  Result Value Ref Range   TSH 2.140 0.350 - 4.500 uIU/mL  Magnesium     Status: None   Collection Time: 10/20/15  1:10 AM  Result Value Ref Range   Magnesium 1.9 1.7 - 2.4 mg/dL  MRSA PCR Screening     Status: None   Collection Time: 10/20/15  2:24 AM  Result Value Ref Range   MRSA by PCR NEGATIVE NEGATIVE  Urine rapid drug screen (hosp performed)not at [facility]     Status: Abnormal   Collection Time: 10/20/15  7:56 AM  Result Value Ref Range   Opiates NONE DETECTED NONE DETECTED   Cocaine NONE DETECTED NONE DETECTED  Benzodiazepines NONE DETECTED NONE DETECTED   Amphetamines NONE DETECTED NONE DETECTED   Tetrahydrocannabinol POSITIVE (A) NONE DETECTED   Barbiturates NONE DETECTED NONE DETECTED    Signed: Liberty Handy, MD 10/20/2015, 12:07 PM

## 2015-10-20 NOTE — Progress Notes (Signed)
Pt back from MRI 

## 2015-10-20 NOTE — Progress Notes (Signed)
Pt alert and oriented x4. Respirations even and unlabored, bilateral symmetrical rise and fall of chest. Skin warm and dry. In no acute distress. Denies needs. Pt to MRI

## 2015-10-20 NOTE — ED Notes (Signed)
Patient transported to X-ray 

## 2015-10-20 NOTE — H&P (Signed)
Date: 10/20/2015               Patient Name:  Samantha [name] MRN: [id number]  DOB: 1975-10-15 Age / Sex: 40 y.o., female   PCP: Provider Default, MD         Medical Service: Internal Medicine Teaching Service         Attending Physician: Dr. Oval Linsey, MD    First Contact: Dr. Marijean Bravo Pager: [phone]  Second Contact: Dr. Marvel Plan Pager: [phone]       After Hours (After 5p/  First Contact Pager: [phone]  weekends / holidays): Second Contact Pager: [phone]   Chief Complaint: Headache, Right arm and facial numbness and weakness  History of Present Illness: 40 y/o woman with PMHx of TIA presents to ED after waking up from a nap around 6:15pm with headache, then developed diplopia and R hand numbness that progressed to shoulder and R face, which resolved approximately 20 minutes later. This numbness was accompanied with subjective weakness but not witnessed. She denies any LOC or involuntary movements. Afterwards all symptoms were improved except for left sided throbbing headache, photophobia, and nausea which were present throughout the episode.  Earlier today she reports feeling fever and chills, one episode of vomiting, and body aches since waking up this morning. These symptoms caused her to go home from work and take a nap in the afternoon. She was previously feeling at baseline. She states she does not have frequent headaches and denies any personal or family history of migraine. However on chart review she was having several recurrent headaches after her hospitalization in August.  Her home mediations are lipitor and aspirin but she was not taking these due to running out. These were prescribed after a previous episode in 04/2015 that was diagnosed as TIA after full workup. She continues to smoke 1 ppd cigarettes. She uses depo-medrol for contraception. She does have a strong family history of atherosclerotic disease including MI in sister at 70 y/o.  Meds: Current Facility-Administered  Medications  Medication Dose Route Frequency Provider Last Rate Last Dose  .  stroke: mapping our early stages of recovery book   Does not apply Once Marjan Rabbani, MD      . 0.9 %  sodium chloride infusion   Intravenous Continuous Marjan Rabbani, MD      . acetaminophen (TYLENOL) tablet 650 mg  650 mg Oral Q6H PRN Juluis Mire, MD      . aspirin EC tablet 81 mg  81 mg Oral Daily Marjan Rabbani, MD      . aspirin suppository 300 mg  300 mg Rectal Once Juluis Mire, MD       Or  . aspirin tablet 325 mg  325 mg Oral Once Marjan Rabbani, MD      . atorvastatin (LIPITOR) tablet 40 mg  40 mg Oral Daily Marjan Rabbani, MD      . enoxaparin (LOVENOX) injection 40 mg  40 mg Subcutaneous Q24H Marjan Rabbani, MD      . fluticasone (FLONASE) 50 MCG/ACT nasal spray 2 spray  2 spray Each Nare Daily Marjan Rabbani, MD      . nicotine (NICODERM CQ - dosed in mg/24 hours) patch 21 mg  21 mg Transdermal Daily Marjan Rabbani, MD      . ondansetron (ZOFRAN) injection 4 mg  4 mg Intravenous Q6H PRN Marjan Rabbani, MD      . senna-docusate (Senokot-S) tablet 1 tablet  1 tablet Oral QHS PRN Juluis Mire, MD  Allergies: Allergies as of 10/19/2015  . (No Known Allergies)   Past Medical History  Diagnosis Date  . Back pain   . Medical history non-contributory   . Stroke ([place])   . TIA (transient ischemic attack)    Past Surgical History  Procedure Laterality Date  . Leep     Family History  Problem Relation Age of Onset  . Anesthesia problems Neg Hx   . Other Neg Hx   . Migraines Neg Hx    Social History   Social History  . Marital Status: Single    Spouse Name: N/A  . Number of Children: N/A  . Years of Education: N/A   Occupational History  . Not on file.   Social History Main Topics  . Smoking status: Current Every Day Smoker -- 1.00 packs/day    Last Attempt to Quit: 12/25/2014  . Smokeless tobacco: Not on file  . Alcohol Use: No  . Drug Use: No  . Sexual Activity: Not  on file   Other Topics Concern  . Not on file   Social History Narrative    Review of Systems: Review of Systems  Constitutional: Positive for fever and chills.  HENT: Negative for hearing loss.   Eyes: Positive for blurred vision, double vision and photophobia.  Respiratory: Negative for shortness of breath.   Cardiovascular: Positive for palpitations.  Gastrointestinal: Positive for nausea, vomiting and diarrhea.  Genitourinary: Negative for dysuria.  Musculoskeletal: Positive for myalgias.  Skin: Negative for rash.  Neurological: Positive for tingling, sensory change, focal weakness and headaches.  Endo/Heme/Allergies: Positive for environmental allergies.  Psychiatric/Behavioral: Negative for memory loss.    Physical Exam: Blood pressure 119/65, pulse 64, temperature 98.8 F (37.1 C), temperature source Oral, resp. rate 18, height 5\' 4"  (1.626 m), weight 62.823 kg (138 lb 8 oz), SpO2 100 %.  GENERAL- alert, co-operative, NAD HEENT- Atraumatic, PERRL, EOMI, oral mucosa slightly dry, no cervical LN enlargement CARDIAC- RRR, no murmurs, rubs or gallops. RESP- CTAB, no wheezes or crackles. ABDOMEN- Soft, nontender, no guarding or rebound, normoactive bowel sounds present BACK- Normal curvature, no paraspinal tenderness, no CVA tenderness. NEURO- No obvious Cr N abnormality, strength upper and lower extremities- 5/5, Sensation intact globally EXTREMITIES- pulse 2+, symmetric, no pedal edema. SKIN- Warm, dry, No rash or lesion. PSYCH- Normal mood and affect, appropriate thought content and speech.   Lab results: Basic Metabolic Panel:  Recent Labs  10/19/15 2053 10/20/15 0110  NA 141  --   K 4.0  --   CL 106  --   CO2 23  --   GLUCOSE 98  --   BUN 14  --   CREATININE 0.81  --   CALCIUM 9.3  --   MG  --  1.9   Liver Function Tests:  Recent Labs  10/19/15 2053  AST 22  ALT 9*  ALKPHOS 61  BILITOT 0.7  PROT 7.0  ALBUMIN 3.7   No results for input(s):  LIPASE, AMYLASE in the last 72 hours. No results for input(s): AMMONIA in the last 72 hours. CBC:  Recent Labs  10/19/15 2053  WBC 5.3  NEUTROABS 1.6*  HGB 13.7  HCT 41.8  MCV 92.3  PLT 220   Cardiac Enzymes: No results for input(s): CKTOTAL, CKMB, CKMBINDEX, TROPONINI in the last 72 hours. BNP: No results for input(s): PROBNP in the last 72 hours. D-Dimer: No results for input(s): DDIMER in the last 72 hours. CBG: No results for input(s): GLUCAP in the  last 72 hours. Hemoglobin A1C: No results for input(s): HGBA1C in the last 72 hours. Fasting Lipid Panel:  Recent Labs  10/20/15 0110  CHOL 187  HDL 55  LDLCALC 121*  TRIG 53  CHOLHDL 3.4   Thyroid Function Tests: No results for input(s): TSH, T4TOTAL, FREET4, T3FREE, THYROIDAB in the last 72 hours. Anemia Panel: No results for input(s): VITAMINB12, FOLATE, FERRITIN, TIBC, IRON, RETICCTPCT in the last 72 hours. Coagulation: No results for input(s): LABPROT, INR in the last 72 hours. Urine Drug Screen: Drugs of Abuse     Component Value Date/Time   LABOPIA POSITIVE* 01/01/2015 2324   COCAINSCRNUR NONE DETECTED 01/01/2015 2324   LABBENZ NONE DETECTED 01/01/2015 2324   AMPHETMU NONE DETECTED 01/01/2015 2324   THCU POSITIVE* 01/01/2015 2324   LABBARB NONE DETECTED 01/01/2015 2324    Alcohol Level:  Recent Labs  10/20/15 0110  ETH <5   Urinalysis:  Recent Labs  10/19/15 2033  COLORURINE YELLOW  LABSPEC 1.027  PHURINE 6.0  GLUCOSEU NEGATIVE  HGBUR MODERATE*  BILIRUBINUR NEGATIVE  KETONESUR NEGATIVE  PROTEINUR NEGATIVE  NITRITE NEGATIVE  LEUKOCYTESUR NEGATIVE    Imaging results:  Dg Chest 2 View  10/20/2015  CLINICAL DATA:  40 year old female with transient ischemic attack, and weakness. EXAM: CHEST  2 VIEW COMPARISON:  Radiograph dated 04/21/2015 FINDINGS: The heart size and mediastinal contours are within normal limits. Both lungs are clear. The visualized skeletal structures are unremarkable.  Multiple small metallic densities noted over the posterior thoracic wall similar to prior study. IMPRESSION: No active cardiopulmonary disease. Electronically Signed   By: Anner Crete M.D.   On: 10/20/2015 01:52   Ct Head Wo Contrast  10/20/2015  CLINICAL DATA:  Stroke-like symptoms. Numbness in arms and face. Diplopia. Symptoms have now resolved. EXAM: CT HEAD WITHOUT CONTRAST TECHNIQUE: Contiguous axial images were obtained from the base of the skull through the vertex without intravenous contrast. COMPARISON:  MRI brain 04/21/2015.  CT head 04/21/2015. FINDINGS: No evidence for acute infarction, hemorrhage, mass lesion, hydrocephalus, or extra-axial fluid. Normal cerebral volume. No white matter disease. Calvarium intact. Chronic and acute sinus disease in the LEFT maxillary and LEFT ethmoid regions, incompletely evaluated. No mastoid or middle ear fluid. Similar sinus pathology on the previous exam. IMPRESSION: CT head shows no acute or focal intracranial abnormality. LEFT ethmoid and maxillary sinus disease, incompletely evaluated, but with suggestion of layering fluid in the LEFT maxillary sinus. Electronically Signed   By: Staci Righter M.D.   On: 10/20/2015 00:01    Other results: EKG: Sinus rhythm, abnormal QRS progression, ST abnormality- inversion in anterior precordial leads, consistent with prior studies from 04/2015  Assessment & Plan by Problem: TIA (Transient ischemic attack) Presumed TIA recurrence due to similar presentation as last year. Focal areas of weakness and parasthesias in RUE and R face are consistent. However she was feeling generally ill earlier in the day and had accompanying headache, nausea, photophobia type symptoms more consistent with a migraine. Still negative for risk obvious risk factors for recurrent CVA at this time. -Admit for observation w/ stroke protocol -MRI head -ASA 325mg  -Call neuro in AM for further recommendations, and needs outpatient follow up  arranged  Headache Migraine vs IIH vs viral illness? Symptoms and presentation are concerning for migraine due to unilateral, associated vision changes, nausea, lasting several hours. However no personal or family history. Can trial migraine cocktail and assess response. IIH no obvious papilledema but exam limited and no persistent visual changes. Fluid collection  in L ethmoid sinus incidental finding on head CT, chronic, unclear significance. -Will consider migraine cocktail if HA not improving spontaneously  ?Athsma PFTs in 04/2015 not consistent with asthma. Possible she has undertreated chronic allergies as alternate cause of frequent airway irritation. Also still smokes 1ppd. CXR clear on presentation besides clear besides metallic pellets redemonstrated from earlier studies. -nicotine patch -start flonase daily  Hyperlipidemia Improved from 04/2015. She reports earlier compliance but no longer on statin for several weeks. -atorvastatin 40mg   Palpitations Underwent 30 day event monitor in 05/2015 with no unusual events recorded. Previous thyroid study negative. EKG negative. Stable.  Diet: Regular VTE ppx: Malin enoxparin FULL CODE  Dispo: Disposition is deferred at this time, awaiting improvement of current medical problems. Anticipated discharge in approximately 0-1 day(s).   The patient does not have a current PCP (Provider Default, MD) and does need an [facility] hospital follow-up appointment after discharge.  The patient does not know have transportation limitations that hinder transportation to clinic appointments.  Signed: Collier Salina, MD 10/20/2015, 2:20 AM

## 2015-10-20 NOTE — Progress Notes (Cosign Needed)
Subjective:  Patient denied any weakness, headaches, changes in sensation, or confusion this morning. She says she is back to normal.  She provided some additional history, reporting that all of her episodes in the past have included a headache, nausea, and vision changes. She denies seeing "stars," but was noted to see stars during her August 11 admission. She said at first that she "saw double," but upon further inquiry, it seemed like she saw that objects had an outline, more consistent with aura. She said that her headache episodes do not occur frequently, but when they do, they can be rather severe. It seems that they occur at least twice per year. She indicated they typically last for more than 4 hours, not 30 minutes, and may be associated with lacrimation.    Objective: Vital signs in last 24 hours: Filed Vitals:   10/20/15 0541 10/20/15 0741 10/20/15 1000 10/20/15 1205  BP: 98/61 93/62 108/66   Pulse: 60 64 75 74  Temp: 98.7 F (37.1 C) 98.4 F (36.9 C) 98.6 F (37 C) 98.8 F (37.1 C)  TempSrc: Oral Oral Oral Oral  Resp: 18 18 17 16   Height:      Weight:      SpO2: 100% 100% 100% 100%   Weight change:   Intake/Output Summary (Last 24 hours) at 10/20/15 1206 Last data filed at 10/20/15 0811  Gross per 24 hour  Intake    120 ml  Output      0 ml  Net    120 ml   Physical Exam General: Sleeping, when awakened she was in NAD. HEENT: No scleral icterus, EOMI, PERRL, Moist mucous membranes, no tonsillar exudate or erythema Cardiovascular: RRR, no m/r/g Pulmonary: Wheezing auscultated bilaterally. Unlabored breathing. No crackles or rhonchi. Abdominal: Soft, NT/ND. Normal bowel sounds. Extremities: No clubbing, cyanosis, or edema Neurological: AAOx3. Speech normal. Recalled 3/3 items after several minutes. Tongue midline. Face symmetric. Light touch sensation in tact throughout. 5/5 strength in all extremities. FTN normal. EOMI, PERRL Psychiatric: Normal behavior and  affect  Lab Results: Basic Metabolic Panel:  Recent Labs Lab 10/19/15 2053 10/20/15 0110  NA 141  --   K 4.0  --   CL 106  --   CO2 23  --   GLUCOSE 98  --   BUN 14  --   CREATININE 0.81  --   CALCIUM 9.3  --   MG  --  1.9   Liver Function Tests:  Recent Labs Lab 10/19/15 2053  AST 22  ALT 9*  ALKPHOS 61  BILITOT 0.7  PROT 7.0  ALBUMIN 3.7   CBC:  Recent Labs Lab 10/19/15 2053  WBC 5.3  NEUTROABS 1.6*  HGB 13.7  HCT 41.8  MCV 92.3  PLT 220    Fasting Lipid Panel:  Recent Labs Lab 10/20/15 0110  CHOL 187  HDL 55  LDLCALC 121*  TRIG 53  CHOLHDL 3.4   Thyroid Function Tests:  Recent Labs Lab 10/20/15 0110  TSH 2.140   Urine Drug Screen: Drugs of Abuse     Component Value Date/Time   LABOPIA NONE DETECTED 10/20/2015 0756   COCAINSCRNUR NONE DETECTED 10/20/2015 0756   LABBENZ NONE DETECTED 10/20/2015 0756   AMPHETMU NONE DETECTED 10/20/2015 0756   THCU POSITIVE* 10/20/2015 0756   LABBARB NONE DETECTED 10/20/2015 0756    Alcohol Level:  Recent Labs Lab 10/20/15 0110  ETH <5   Urinalysis:  Recent Labs Lab 10/19/15 2033  Carney  Davie  1.027  PHURINE 6.0  GLUCOSEU NEGATIVE  HGBUR MODERATE*  BILIRUBINUR NEGATIVE  KETONESUR NEGATIVE  PROTEINUR NEGATIVE  NITRITE NEGATIVE  LEUKOCYTESUR NEGATIVE   Studies/Results: Dg Chest 2 View  10/20/2015  CLINICAL DATA:  40 year old female with transient ischemic attack, and weakness. EXAM: CHEST  2 VIEW COMPARISON:  Radiograph dated 04/21/2015 FINDINGS: The heart size and mediastinal contours are within normal limits. Both lungs are clear. The visualized skeletal structures are unremarkable. Multiple small metallic densities noted over the posterior thoracic wall similar to prior study. IMPRESSION: No active cardiopulmonary disease. Electronically Signed   By: Anner Crete M.D.   On: 10/20/2015 01:52   Ct Head Wo Contrast  10/20/2015  CLINICAL DATA:  Stroke-like symptoms.  Numbness in arms and face. Diplopia. Symptoms have now resolved. EXAM: CT HEAD WITHOUT CONTRAST TECHNIQUE: Contiguous axial images were obtained from the base of the skull through the vertex without intravenous contrast. COMPARISON:  MRI brain 04/21/2015.  CT head 04/21/2015. FINDINGS: No evidence for acute infarction, hemorrhage, mass lesion, hydrocephalus, or extra-axial fluid. Normal cerebral volume. No white matter disease. Calvarium intact. Chronic and acute sinus disease in the LEFT maxillary and LEFT ethmoid regions, incompletely evaluated. No mastoid or middle ear fluid. Similar sinus pathology on the previous exam. IMPRESSION: CT head shows no acute or focal intracranial abnormality. LEFT ethmoid and maxillary sinus disease, incompletely evaluated, but with suggestion of layering fluid in the LEFT maxillary sinus. Electronically Signed   By: Staci Righter M.D.   On: 10/20/2015 00:01   Mr Brain Wo Contrast  10/20/2015  CLINICAL DATA:  40 year old female with headache, right hand numbness progressing to right shoulder and face numbness, and diplopia at 1815 hours yesterday. Symptoms other than headache resolved. Initial encounter. EXAM: MRI HEAD WITHOUT CONTRAST MRA HEAD WITHOUT CONTRAST TECHNIQUE: Multiplanar, multiecho pulse sequences of the brain and surrounding structures were obtained without intravenous contrast. Angiographic images of the head were obtained using MRA technique without contrast. COMPARISON:  Head CT without contrast 10/19/2015. Brain MRI and intracranial MRA 04/21/2015 FINDINGS: MRI HEAD FINDINGS Major intracranial vascular flow voids are stable. Cerebral volume remains normal. Partially empty sella re- demonstrated and unchanged. No restricted diffusion to suggest acute infarction. No midline shift, mass effect, evidence of mass lesion, ventriculomegaly, extra-axial collection or acute intracranial hemorrhage. Cervicomedullary junction within normal limits. Negative visualized  cervical spine. Pear[place] Cables and white matter signal is within normal limits for age throughout the brain. No chronic cerebral blood products. Visible internal auditory structures appear normal. Mastoids are clear. Chronic but increased left maxillary sinus inflammatory changes with combined fluid and mucous retention cysts. Chronic ethmoid sinus and frontoethmoidal recess opacification appears stable. Other paranasal sinuses are clear. Negative orbit and scalp soft tissues. Stable bone marrow signal. MRA HEAD FINDINGS Stable antegrade flow in the posterior circulation with codominant distal vertebral arteries. PICA origins remain patent. Normal vertebrobasilar junction. No basilar stenosis. Normal SCA and PCA origins. Left posterior communicating artery is present. Bilateral PCA branches are within normal limits. Stable antegrade flow in both ICA siphons. Normal ophthalmic and left posterior communicating artery origins. Normal carotid termini, MCA and ACA origins. Diminutive or absent anterior communicating artery. Visualized bilateral ACA branches are within normal limits. Visualized bilateral MCA branches are stable and within normal limits. IMPRESSION: 1. Stable and normal for age noncontrast MRI appearance of the brain. 2. Stable and normal intracranial MRA. 3. Acute on chronic left paranasal sinusitis, mildly progressed since August. Electronically Signed   By: Herminio Heads.D.  On: 10/20/2015 10:24   Mr Jodene Nam Head/brain Wo Cm  10/20/2015  CLINICAL DATA:  40 year old female with headache, right hand numbness progressing to right shoulder and face numbness, and diplopia at 1815 hours yesterday. Symptoms other than headache resolved. Initial encounter. EXAM: MRI HEAD WITHOUT CONTRAST MRA HEAD WITHOUT CONTRAST TECHNIQUE: Multiplanar, multiecho pulse sequences of the brain and surrounding structures were obtained without intravenous contrast. Angiographic images of the head were obtained using MRA technique without  contrast. COMPARISON:  Head CT without contrast 10/19/2015. Brain MRI and intracranial MRA 04/21/2015 FINDINGS: MRI HEAD FINDINGS Major intracranial vascular flow voids are stable. Cerebral volume remains normal. Partially empty sella re- demonstrated and unchanged. No restricted diffusion to suggest acute infarction. No midline shift, mass effect, evidence of mass lesion, ventriculomegaly, extra-axial collection or acute intracranial hemorrhage. Cervicomedullary junction within normal limits. Negative visualized cervical spine. Pear[place] Cables and white matter signal is within normal limits for age throughout the brain. No chronic cerebral blood products. Visible internal auditory structures appear normal. Mastoids are clear. Chronic but increased left maxillary sinus inflammatory changes with combined fluid and mucous retention cysts. Chronic ethmoid sinus and frontoethmoidal recess opacification appears stable. Other paranasal sinuses are clear. Negative orbit and scalp soft tissues. Stable bone marrow signal. MRA HEAD FINDINGS Stable antegrade flow in the posterior circulation with codominant distal vertebral arteries. PICA origins remain patent. Normal vertebrobasilar junction. No basilar stenosis. Normal SCA and PCA origins. Left posterior communicating artery is present. Bilateral PCA branches are within normal limits. Stable antegrade flow in both ICA siphons. Normal ophthalmic and left posterior communicating artery origins. Normal carotid termini, MCA and ACA origins. Diminutive or absent anterior communicating artery. Visualized bilateral ACA branches are within normal limits. Visualized bilateral MCA branches are stable and within normal limits. IMPRESSION: 1. Stable and normal for age noncontrast MRI appearance of the brain. 2. Stable and normal intracranial MRA. 3. Acute on chronic left paranasal sinusitis, mildly progressed since August. Electronically Signed   By: Genevie Ann M.D.   On: 10/20/2015 10:24    Medications: I have reviewed the patient's current medications. Scheduled Meds: . aspirin EC  81 mg Oral Daily  . atorvastatin  40 mg Oral Daily  . enoxaparin (LOVENOX) injection  40 mg Subcutaneous Q24H  . fluticasone  2 spray Each Nare Daily  . nicotine  21 mg Transdermal Daily  . 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 The presence of focal neurologic deficits suggests that these migraines are complex in nature. Previous ischemic workup from the 04/2015 episode showed a normal echocardiography, brain MRI, and 30 day cardiac monitoring. Brain MRI from this episode was reassuring again. Therefore, I have low suspicion that her symptoms are related to an ischemic event, and in tandem with her other symptoms, are more consistent with complex migraine. Another possibility is reversible cerebral vasoconstriction syndrome (RCVS), which is associated with cannabis use (THC +) and may mimic a migraine. Unfortunately, her known history of asthma and wheezing on exam makes propranolol migraine prophylaxis a relative contraindication. Therefore, we will initiate verapamil 40 mg BID, which would concurrently address RCVS and possibly migraine. - Verapamil 40 mg BID, there is room to titrate this up under the guidance of a neurologist if  her blood pressure can tolerate it - Outpatient referral to [facility]  Asthma: Some wheezing on exam today.  - Albuterol prn  Hyperlipidemia: Continue Atrorastatin 40 mg daily  Tobacco Use Disorder: Nicotine patch.  Dispo: Discharge home today. Follow-up in [facility] clinic and [facility] arranged.  The patient does not  have a current PCP (Provider Default, MD) and does need an [facility] hospital follow-up appointment after discharge.  The patient does have transportation limitations that hinder transportation to clinic appointments.  .Services Needed at time of discharge: Y = Yes, Blank = No PT:   OT:   RN:   Equipment:   Other:       Liberty Handy, MD 10/20/2015, 12:06 PM

## 2015-10-20 NOTE — Progress Notes (Signed)
Discharge orders received. Pt educated on d/c instructions. Pt verbalized understanding. IV and tele removed. Pt taken downstairs by staff via wheelchair.

## 2015-10-20 NOTE — Care Management Note (Signed)
Case Management Note  Patient Details  Name: Samantha [name] MRN: [id number] Date of Birth: May 12, 1976  Subjective/Objective:                    Action/Plan: Patient is discharging to home with self care. Pt is followed by Internal Medicine in the clinic. No further needs per CM.  Expected Discharge Date:                  Expected Discharge Plan:     In-House Referral:     Discharge planning Services     Post Acute Care Choice:    Choice offered to:     DME Arranged:    DME Agency:     HH Arranged:    HH Agency:     Status of Service:  In process, will continue to follow  Medicare Important Message Given:    Date Medicare IM Given:    Medicare IM give by:    Date Additional Medicare IM Given:    Additional Medicare Important Message give by:     If discussed at [place] of Stay Meetings, dates discussed:    Additional Comments:  Pollie Friar, RN 10/20/2015, 1:32 PM

## 2015-10-21 LAB — HEMOGLOBIN A1C
HEMOGLOBIN A1C: 5.9 % — AB (ref 4.8–5.6)
MEAN PLASMA GLUCOSE: 123 mg/dL

## 2015-10-25 ENCOUNTER — Ambulatory Visit: Payer: MEDICAID | Admitting: Neurology

## 2015-10-25 ENCOUNTER — Telehealth: Payer: Self-pay

## 2015-10-25 NOTE — Telephone Encounter (Signed)
Patient canceled appointment the day of appointment.

## 2015-10-26 ENCOUNTER — Telehealth: Payer: Self-pay

## 2015-10-26 NOTE — Telephone Encounter (Signed)
APPT REMINDER CALL, LMTCB IF SHE NEEDS TO CANCEL °

## 2015-10-27 ENCOUNTER — Ambulatory Visit: Payer: PRIVATE HEALTH INSURANCE | Admitting: Internal Medicine

## 2015-11-01 ENCOUNTER — Ambulatory Visit (INDEPENDENT_AMBULATORY_CARE_PROVIDER_SITE_OTHER): Payer: PRIVATE HEALTH INSURANCE | Admitting: Neurology

## 2015-11-01 ENCOUNTER — Encounter: Payer: Self-pay | Admitting: Neurology

## 2015-11-01 VITALS — BP 114/76 | HR 79 | Ht 64.0 in | Wt 145.0 lb

## 2015-11-01 DIAGNOSIS — G43109 Migraine with aura, not intractable, without status migrainosus: Secondary | ICD-10-CM | POA: Diagnosis not present

## 2015-11-01 HISTORY — DX: Migraine with aura, not intractable, without status migrainosus: G43.109

## 2015-11-01 MED ORDER — TRAZODONE HCL 50 MG PO TABS: 50.0000 mg | ORAL_TABLET | Freq: Every day | ORAL | Status: DC

## 2015-11-01 NOTE — Patient Instructions (Signed)

## 2015-11-01 NOTE — Progress Notes (Signed)
Reason for visit: Migraine headache  Referring physician: [facility] [place]ullo is a 40 y.o. female  History of present illness:  Samantha [name] is a 40 year old right-handed black female with a history of migraine headache that has been relatively frequent over the last 3 years. The patient has had headache episodes that generally occur around the left frontal area. The patient indicates that the headache may be associated with white spots in the vision, she may get dizzy, and have cognitive clouding during the headache. The headache may last all day long. She indicates that she is getting headaches about every other day, and the headaches may be associated with lack of good sleep. The patient will get to sleep well, but she wakes up with frequent dreaming, and cannot get back to sleep. The patient has had 2 episodes of strokelike symptoms with her headaches, one in August 2016, and one recently in February 2017. The episodes are associated with numbness of the right face and right arm, some weakness or clumsiness on that side, speech issues, and double vision. The patient feels confused during the episodes. The patient has had very thorough workups that have included MRI of the brain, MRA of the head, 2-D echocardiogram, carotid Doppler studies, 30 day cardiac monitor study, and a hypercoagulable state workup on blood work. All of these evaluations were negative. The patient smokes cigarettes, and she is on birth control, primarily progesterone containing. The patient does smoke marijuana on occasion. She has a history of asthma. The patient was placed on verapamil, but she has not yet started this medication. She was told to go on low-dose aspirin, she has not done this yet. She continues to smoke. The patient will take Advil for the headache with some benefit. She denies any difficulty with balance or difficulty controlling the bowels or the bladder. She denies any blackout episodes. She does not  have a primary care physician. She comes to this office for an evaluation.  Past Medical History  Diagnosis Date  . Back pain   . Medical history non-contributory   . TIA (transient ischemic attack) 04/2015    questionable, most likely complicated migraine  . 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 Hyperlipidemia     Past Surgical History  Procedure Laterality Date  . Leep      Family History  Problem Relation Age of Onset  . Anesthesia problems Neg Hx   . Other Neg Hx   . Migraines Neg Hx   . Hypertension Mother   . Lupus Mother   . Diabetes Father   . Thyroid disease Sister   . Diabetes Sister   . Cancer Sister     stomach    Social history:  reports that she has been smoking.  She has never used smokeless tobacco. She reports that she uses illicit drugs (Marijuana). She reports that she does not drink alcohol.  Medications:  Prior to Admission medications   Medication Sig Start Date End Date Taking? Authorizing Provider  albuterol (PROVENTIL HFA;VENTOLIN HFA) 108 (90 Base) MCG/ACT inhaler Inhale 2 puffs into the lungs every 4 (four) hours as needed for wheezing or shortness of breath (cough). 09/06/15   Melony Overly, MD  aspirin EC 81 MG EC tablet Take 1 tablet (81 mg total) by mouth daily. To prevent another stroke Patient not taking: Reported on 10/19/2015 04/22/15   Loleta Chance, MD  atorvastatin (LIPITOR) 40 MG tablet Take 1 tablet (40 mg total) by mouth daily at 6  PM. To keep cholesterol levels low Patient not taking: Reported on 10/19/2015 04/22/15   Loleta Chance, MD  buPROPion [facility] SR) 150 MG 12 hr tablet qdaily x 3 days, then bid 05/04/15 08/04/15  Maryellen Pile, MD  MedroxyPROGESTERone Acetate (DEPO-PROVERA IM) Inject into the muscle. Patient unsure of dose    Historical Provider, MD  nicotine (NICODERM CQ - DOSED IN MG/24 HOURS) 21 mg/24hr patch Place 1 patch (21 mg total) onto the skin daily. 10/20/15   Liberty Handy, MD  verapamil (CALAN) 40 MG tablet Take 1 tablet (40 mg  total) by mouth 2 (two) times daily. 10/20/15   Liberty Handy, MD     No Known Allergies  ROS:  Out of a complete 14 system review of symptoms, the patient complains only of the following symptoms, and all other reviewed systems are negative.  Weight loss Blurred vision Shortness of breath Skin rash Achy muscles Memory loss, headache, numbness, weakness, slurred speech, dizziness Anxiety, not enough sleep, decreased energy, change in appetite Insomnia, snoring  Blood pressure 114/76, pulse 79, height 5\' 4"  (1.626 m), weight 145 lb (65.772 kg).  Physical Exam  General: The patient is alert and cooperative at the time of the examination.  Eyes: Pupils are equal, round, and reactive to light. Discs are flat bilaterally.  Neck: The neck is supple, no carotid bruits are noted.  Respiratory: The respiratory examination is clear.  Cardiovascular: The cardiovascular examination reveals a regular rate and rhythm, no obvious murmurs or rubs are noted.  Skin: Extremities are without significant edema.  Neurologic Exam  Mental status: The patient is alert and oriented x 3 at the time of the examination. The patient has apparent normal recent and remote memory, with an apparently normal attention span and concentration ability.  Cranial nerves: Facial symmetry is present. There is good sensation of the face to pinprick and soft touch bilaterally. The strength of the facial muscles and the muscles to head turning and shoulder shrug are normal bilaterally. Speech is well enunciated, no aphasia or dysarthria is noted. Extraocular movements are full. Visual fields are full. The tongue is midline, and the patient has symmetric elevation of the soft palate. No obvious hearing deficits are noted.  Motor: The motor testing reveals 5 over 5 strength of all 4 extremities. Good symmetric motor tone is noted throughout.  Sensory: Sensory testing is intact to pinprick, soft touch, vibration sensation,  and position sense on all 4 extremities. No evidence of extinction is noted.  Coordination: Cerebellar testing reveals good finger-nose-finger and heel-to-shin bilaterally.  Gait and station: Gait is normal. Tandem gait is normal. Romberg is negative. No drift is seen.  Reflexes: Deep tendon reflexes are symmetric and normal bilaterally. Toes are downgoing bilaterally.   MRI brain, MRA head 10/20/15:  IMPRESSION: 1. Stable and normal for age noncontrast MRI appearance of the brain. 2. Stable and normal intracranial MRA. 3. Acute on chronic left paranasal sinusitis, mildly progressed since August.  * MRI scan images were reviewed online. I agree with the written report.   2D echo 04/22/15:  Study Conclusions  - Left ventricle: The cavity size was normal. Wall thickness was normal. Systolic function was normal. The estimated ejection fraction was in the range of 50% to 55%. Wall motion was normal; there were no regional wall motion abnormalities. - Mitral valve: There was mild regurgitation.   Carotid doppler study 04/22/15:  Summary: Findings suggest 1-39% internal carotid artery stenosis bilaterally. Vertebral arteries are patent with antegrade flow.  Cardiac monitor study 06/14/15:  30 day monitor shows normal sinus rhythm. No atrial fibrillation. No significant ectopy. No marked tachycardia or bradycardia. Normal 30 day monitor.    Assessment/Plan:  1. Basilar migraine  2. Insomnia  The patient will go on low-dose aspirin, try to quit smoking, and go on verapamil. She will follow-up in 3 months. If she requires a dose adjustment on her medications, she is to contact our office. She is not a candidate for triptan medications given the neurologic features of the migraine headache. She will be placed on trazodone for sleep at night. She does not have a primary care physician.  Jill Alexanders MD 11/01/2015 8:11 PM  [facility] [address] [place], [place] [address]  Phone [phone] Fax [phone]

## 2015-11-07 ENCOUNTER — Encounter: Payer: Self-pay | Admitting: Internal Medicine

## 2015-11-07 ENCOUNTER — Ambulatory Visit (INDEPENDENT_AMBULATORY_CARE_PROVIDER_SITE_OTHER): Payer: PRIVATE HEALTH INSURANCE | Admitting: Internal Medicine

## 2015-11-07 VITALS — BP 108/74 | HR 68 | Temp 98.6°F | Ht 64.0 in | Wt 145.6 lb

## 2015-11-07 DIAGNOSIS — G43109 Migraine with aura, not intractable, without status migrainosus: Secondary | ICD-10-CM

## 2015-11-07 DIAGNOSIS — L732 Hidradenitis suppurativa: Secondary | ICD-10-CM | POA: Diagnosis not present

## 2015-11-07 MED ORDER — DOXYCYCLINE HYCLATE 100 MG PO TABS
100.0000 mg | ORAL_TABLET | Freq: Two times a day (BID) | ORAL | Status: DC
Start: 2015-11-07 — End: 2015-12-26

## 2015-11-07 MED ORDER — CLINDAMYCIN PHOSPHATE 1 % EX GEL: Freq: Two times a day (BID) | CUTANEOUS | Status: DC

## 2015-11-07 NOTE — Progress Notes (Signed)
Subjective:   Patient ID: Samantha [name] female   DOB: 1976-01-10 40 y.o.   MRN: [id number]  HPI: [name] is a 40 y.o. female w/ PMHx of TIA, h/o Migraine's, and HLD, presents to the clinic today for a hospital follow-up visit regarding her complex migraines. Says her headaches are better, however, she has not started to take her verapamil yet as she has not picked this medication up from the pharmacy. Seen by outpatient neurology as well, patient not a candidate for triptan therapy.   Her biggest complaint today is boils in her axilla bilaterally, her groin, and right gluteal cleft. Says he has had this problem for years, sometimes they are hard and non-tender, other times they are painful and draining. Has not seen a skin doctor or other physician for this in the past, although does state she had an I&D in th ED for a boil on her gluteal region previously. She denies any fever, chills, or nausea. The lesions in he groin and axilla are currently not painful, however, the one on her back side is tender and makes it difficult to sit.   Past Medical History  Diagnosis Date  . Back pain   . Medical history non-contributory   . TIA (transient ischemic attack) 04/2015    questionable, most likely complicated migraine  . 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 Hyperlipidemia    Current Outpatient Prescriptions  Medication Sig Dispense Refill  . albuterol (PROVENTIL HFA;VENTOLIN HFA) 108 (90 Base) MCG/ACT inhaler Inhale 2 puffs into the lungs every 4 (four) hours as needed for wheezing or shortness of breath (cough). 1 Inhaler 0  . aspirin EC 81 MG EC tablet Take 1 tablet (81 mg total) by mouth daily. To prevent another stroke 90 tablet 3  . atorvastatin (LIPITOR) 40 MG tablet Take 1 tablet (40 mg total) by mouth daily at 6 PM. To keep cholesterol levels low 90 tablet 3  . buPROPion (WELLBUTRIN SR) 150 MG 12 hr tablet qdaily x 3 days, then bid 60 tablet 2  . MedroxyPROGESTERone Acetate (DEPO-PROVERA IM)  Inject into the muscle. Patient unsure of dose    . nicotine (NICODERM CQ - DOSED IN MG/24 HOURS) 21 mg/24hr patch Place 1 patch (21 mg total) onto the skin daily. 28 patch 0  . traZODone (DESYREL) 50 MG tablet Take 1 tablet (50 mg total) by mouth at bedtime. 30 tablet 3  . verapamil (CALAN) 40 MG tablet Take 1 tablet (40 mg total) by mouth 2 (two) times daily. 60 tablet 1   No current facility-administered medications for this visit.    Review of Systems: General: Denies fever, chills, diaphoresis, appetite change and fatigue.  Respiratory: Denies SOB, DOE, cough, and wheezing.   Cardiovascular: Denies chest pain and palpitations.  Gastrointestinal: Denies nausea, vomiting, abdominal pain, and diarrhea.  Genitourinary: Denies dysuria, increased frequency, and flank pain. Endocrine: Denies hot or cold intolerance, polyuria, and polydipsia. Musculoskeletal: Denies myalgias, back pain, joint swelling, arthralgias and gait problem.  Skin: Positive for multiple boils. Denies pallor, rash.  Neurological: Denies dizziness, seizures, syncope, weakness, lightheadedness, numbness and headaches.  Psychiatric/Behavioral: Denies mood changes, and sleep disturbances.  Objective:   Physical Exam: Filed Vitals:   11/07/15 1600  BP: 108/74  Pulse: 68  Temp: 98.6 F (37 C)  TempSrc: Oral  Height: 5\' 4"  (1.626 m)  Weight: 145 lb 9.6 oz (66.044 kg)  SpO2: 100%    General: AA female, alert, cooperative, NAD. HEENT: PERRL, EOMI. Moist mucus membranes  Neck: Full range of motion without pain, supple, no lymphadenopathy or carotid bruits Lungs: Clear to ascultation bilaterally, normal work of respiration, no wheezes, rales, rhonchi Heart: RRR, no murmurs, gallops, or rubs Abdomen: Soft, non-tender, non-distended, BS + Extremities: No cyanosis, clubbing, or edema.4-5, 0.5-1.0 cm hardened lesions in the left axilla, fewer in the right axilla. Non-tender, not draining. No lymphadenopathy.  GU: 2-3  hardened lesions in the left groin, lateral to the left vulva. Also non-tender. On right gluteal cleft, 2 x 2 cm tender, indurated, fluctuant area with mild surrounding erythema. Quite tender to the touch.  Neurologic: Alert & oriented x3, cranial nerves II-XII intact, strength grossly intact, sensation intact to light touch   Assessment & Plan:   Please see problem based assessment and plan.

## 2015-11-07 NOTE — Patient Instructions (Signed)
1. Please schedule a follow up appointment for 1-2 weeks.   2. Please take all medications as previously prescribed with the following changes:  Start using Clindamycin gel twice daily on groin area and axilla for non-healing boils.   Take Doxycycline 100 mg twice daily for 1 week.   Keep taking your migraine medications as prescribed.   3. If you have worsening of your symptoms or new symptoms arise, please call the clinic [phone]), or go to the ER immediately if symptoms are severe.  You have done a great job in taking all your medications. Please continue to do this.

## 2015-11-08 NOTE — Assessment & Plan Note (Signed)
Well controlled currently. Following with outpatient neurology. Has not started Verapamil yet.  -Encouraged patient to start this medication. Will follow with neuro in next several weeks.

## 2015-11-08 NOTE — Assessment & Plan Note (Signed)
Patient with several chronic, hardened lesions in bilateral axilla, left groin, and right gluteal cleft. Says most of these have been there for years at this point. Says sometimes they are tender and draining. The lesions in her axilla are non-tender, no discharge, hardened. More in left axilla than the right. Also chronic lesions present in the left groin lateral to the vulva (2-3). Given history and description, seems like hidradenitis suppurativa given that they intermittently drain, cause discomfort, and at times are exquisitely painful. 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 One lesion on her right gluteal cleft is exquisitely tender, erythematous, indurated, and fluctuant (2 x 2 cm with surrounding erythema). Not currently draining. Patient states she is unable to sit normally because of the pain. Given that this is causing her such discomfort, discussed punch debridement vs small I&D. Patient consented for procedure, time out performed. Area cleaned with iodine, and using sterile procedures, area carefully injected with 4 mL of 1% Lidocaine given exquisite tenderness. Even small local anesthetic caused severe discomfort. During process of injecting local anesthetic, lesion broke and discharged 1-2 mL of purulent discharge. Additional small incision made over the top of the lesion with more purulent/sanguinous drainage. Area cleaned, dried, and covered with dry dressing.  -Given Rx for Doxycycline 100 mg bid for 7 days given surrounding mild cellulitis -Also given Rx for Clindamycin 1% gel to use bid on other lesions for 2-3 months.  -Patient will return in 1-2 weeks for follow up regarding gluteal abscess/wound.

## 2015-11-09 NOTE — Progress Notes (Signed)
Internal Medicine Clinic Attending  I saw and evaluated the patient.  I personally confirmed the key portions of the history and exam documented by Dr. Ronnald Ramp and I reviewed pertinent patient test results.  The assessment, diagnosis, and plan were formulated together and I agree with the documentation in the resident's note.  I was present for procedure, I&D

## 2015-11-10 MED FILL — DOXYCYCLINE HYCLATE 100 MG: 100 | 7 days supply | Qty: 14 | Fill #0

## 2015-11-10 MED FILL — ATORVASTATIN 40 MG TABLET: 40 | 30 days supply | Qty: 30 | Fill #1

## 2015-11-10 MED FILL — traZODone HCL 50 MG TABS: 50 | 30 days supply | Qty: 30 | Fill #0

## 2015-11-11 ENCOUNTER — Telehealth: Payer: Self-pay | Admitting: *Deleted

## 2015-11-11 NOTE — Telephone Encounter (Signed)
Pharmacist calls and states that with pt taking both atorvastatin and verapamil, verapamil keeps the atorvastatin in the bloodstream longer, if you have questions you may call daphne at [phone], please advise This note has gone to dr's jones, flores and ford

## 2015-11-14 NOTE — Telephone Encounter (Signed)
Per dr Melburn Hake called daphne back and ok'ed pt to have both meds

## 2015-12-07 ENCOUNTER — Emergency Department (HOSPITAL_COMMUNITY): Payer: PRIVATE HEALTH INSURANCE

## 2015-12-07 ENCOUNTER — Emergency Department (HOSPITAL_COMMUNITY)
Admission: EM | Admit: 2015-12-07 | Discharge: 2015-12-07 | Disposition: A | Discharge status: Home / Self Care | Payer: PRIVATE HEALTH INSURANCE | Attending: Emergency Medicine | Admitting: Emergency Medicine

## 2015-12-07 ENCOUNTER — Encounter (HOSPITAL_COMMUNITY): Payer: Self-pay | Admitting: Emergency Medicine

## 2015-12-07 DIAGNOSIS — Z7982 Long term (current) use of aspirin: Secondary | ICD-10-CM | POA: Insufficient documentation

## 2015-12-07 DIAGNOSIS — R197 Diarrhea, unspecified: Secondary | ICD-10-CM | POA: Insufficient documentation

## 2015-12-07 DIAGNOSIS — F17200 Nicotine dependence, unspecified, uncomplicated: Secondary | ICD-10-CM | POA: Diagnosis not present

## 2015-12-07 DIAGNOSIS — Z3202 Encounter for pregnancy test, result negative: Secondary | ICD-10-CM | POA: Insufficient documentation

## 2015-12-07 DIAGNOSIS — R112 Nausea with vomiting, unspecified: Secondary | ICD-10-CM | POA: Insufficient documentation

## 2015-12-07 DIAGNOSIS — Z8673 Personal history of transient ischemic attack (TIA), and cerebral infarction without residual deficits: Secondary | ICD-10-CM | POA: Insufficient documentation

## 2015-12-07 DIAGNOSIS — Z79899 Other long term (current) drug therapy: Secondary | ICD-10-CM | POA: Diagnosis not present

## 2015-12-07 DIAGNOSIS — I319 Disease of pericardium, unspecified: Secondary | ICD-10-CM | POA: Insufficient documentation

## 2015-12-07 DIAGNOSIS — E785 Hyperlipidemia, unspecified: Secondary | ICD-10-CM | POA: Diagnosis not present

## 2015-12-07 DIAGNOSIS — G43109 Migraine with aura, not intractable, without status migrainosus: Secondary | ICD-10-CM | POA: Insufficient documentation

## 2015-12-07 LAB — COMPREHENSIVE METABOLIC PANEL
ALT: 8 U/L — AB (ref 14–54)
AST: 18 U/L (ref 15–41)
Albumin: 3.8 g/dL (ref 3.5–5.0)
Alkaline Phosphatase: 60 U/L (ref 38–126)
Anion gap: 7 (ref 5–15)
BILIRUBIN TOTAL: 0.4 mg/dL (ref 0.3–1.2)
BUN: 9 mg/dL (ref 6–20)
CHLORIDE: 107 mmol/L (ref 101–111)
CO2: 25 mmol/L (ref 22–32)
Calcium: 9.4 mg/dL (ref 8.9–10.3)
Creatinine, Ser: 0.71 mg/dL (ref 0.44–1.00)
Glucose, Bld: 100 mg/dL — ABNORMAL HIGH (ref 65–99)
Potassium: 3.9 mmol/L (ref 3.5–5.1)
Sodium: 139 mmol/L (ref 135–145)
TOTAL PROTEIN: 7 g/dL (ref 6.5–8.1)

## 2015-12-07 LAB — URINALYSIS, ROUTINE W REFLEX MICROSCOPIC
Bilirubin Urine: NEGATIVE
GLUCOSE, UA: NEGATIVE mg/dL
KETONES UR: NEGATIVE mg/dL
LEUKOCYTES UA: NEGATIVE
Nitrite: NEGATIVE
PROTEIN: NEGATIVE mg/dL
Specific Gravity, Urine: 1.013 (ref 1.005–1.030)
pH: 6 (ref 5.0–8.0)

## 2015-12-07 LAB — I-STAT TROPONIN, ED
TROPONIN I, POC: 0 ng/mL (ref 0.00–0.08)
Troponin i, poc: 0 ng/mL (ref 0.00–0.08)

## 2015-12-07 LAB — CBC
HCT: 40.5 % (ref 36.0–46.0)
Hemoglobin: 13.7 g/dL (ref 12.0–15.0)
MCH: 31.2 pg (ref 26.0–34.0)
MCHC: 33.8 g/dL (ref 30.0–36.0)
MCV: 92.3 fL (ref 78.0–100.0)
PLATELETS: 220 10*3/uL (ref 150–400)
RBC: 4.39 MIL/uL (ref 3.87–5.11)
RDW: 12.4 % (ref 11.5–15.5)
WBC: 6.5 10*3/uL (ref 4.0–10.5)

## 2015-12-07 LAB — URINE MICROSCOPIC-ADD ON: BACTERIA UA: NONE SEEN

## 2015-12-07 LAB — PREGNANCY, URINE: PREG TEST UR: NEGATIVE

## 2015-12-07 LAB — LIPASE, BLOOD: Lipase: 28 U/L (ref 11–51)

## 2015-12-07 MED ORDER — IBUPROFEN 800 MG PO TABS
800.0000 mg | ORAL_TABLET | Freq: Once | ORAL | Status: AC
  Administered 2015-12-07: 800 mg via ORAL
  Filled 2015-12-07: qty 1

## 2015-12-07 MED ORDER — IBUPROFEN 800 MG PO TABS: 800.0000 mg | ORAL_TABLET | Freq: Three times a day (TID) | ORAL | Status: DC

## 2015-12-07 MED ORDER — MORPHINE SULFATE (PF) 4 MG/ML IV SOLN
4.0000 mg | Freq: Once | INTRAVENOUS | Status: AC
  Administered 2015-12-07: 4 mg via INTRAVENOUS
  Filled 2015-12-07: qty 1

## 2015-12-07 MED ORDER — ONDANSETRON HCL 4 MG/2ML IJ SOLN
4.0000 mg | Freq: Once | INTRAMUSCULAR | Status: AC
  Administered 2015-12-07: 4 mg via INTRAVENOUS
  Filled 2015-12-07: qty 2

## 2015-12-07 MED ORDER — SODIUM CHLORIDE 0.9 % IV BOLUS (SEPSIS)
1000.0000 mL | Freq: Once | INTRAVENOUS | Status: AC
  Administered 2015-12-07: 1000 mL via INTRAVENOUS

## 2015-12-07 NOTE — Discharge Instructions (Signed)
Pericarditis Follow up with cardiology.  Return for chest pain, sweating, or shortness of breath. Drink plenty of fluids and stay well-hydrated. Pericarditis is swelling (inflammation) of the pericardium. The pericardium is a thin, double-layered, fluid-filled tissue sac that surrounds the heart. The purpose of the pericardium is to contain the heart in the chest cavity and keep the heart from overexpanding. Different types of pericarditis can occur, such as:  Acute pericarditis. Inflammation can develop suddenly in acute pericarditis.  Chronic pericarditis. Inflammation develops gradually and is long-lasting in chronic pericarditis.  Constrictive pericarditis. In this type of pericarditis, the layers of the pericardium stiffen and develop scar tissue. The scar tissue thickens and sticks together. This makes it difficult for the heart to pump and work as it normally does. CAUSES  Pericarditis can be caused from different conditions, such as:  A bacterial, fungal or viral infection.  After a heart attack (myocardial infarction).  After open-heart surgery (coronary bypass graft surgery).  Auto-immune conditions such as lupus, rheumatoid arthritis or scleroderma.  Kidney failure.  Low thyroid condition (hypothyroidism).  Cancer from another part of the body that has spread (metastasized) to the pericardium.  Chest injury or trauma.  After radiation treatment.  Certain medicines. SYMPTOMS  Symptoms of pericarditis can include:  Chest pain. Chest pain symptoms may increase when laying down and may be relieved when sitting up and leaning forward.  A chronic, dry cough.  Heart palpitations. These may feel like rapid, fluttering or pounding heart beats.  Chest pain may be worse when swallowing.  Dizziness or fainting.  Tiredness, fatigue or lethargy.  Fever. DIAGNOSIS  Pericarditis is diagnosed by the following:  A physical exam. A heart sound called a pericardial friction  rub may be heard when your caregiver listens to your heart.  Blood work. Blood may be drawn to check for an infection and to look at your blood chemistry.  Electrocardiography. During electrocardiography your heart's electrical activity is monitored and recorded with a tracing on paper (electrocardiogram [ECG]).  Echocardiography.  Computed tomography (CT).  Magnetic resonance image (MRI). TREATMENT  To treat pericarditis, it is important to know the cause of it. The cause of pericarditis determines the treatment.   If the cause of pericarditis is due to an infection, treatment is based on the type of infection. If an infection is suspected in the pericardial fluid, a procedure called a pericardial fluid culture and biopsy may be done. This takes a sample of the pericardial fluid. The sample is sent to a lab which runs tests on the pericardial fluid to check for an infection.  If the autoimmune disease is the cause, treatment of the autoimmune condition will help improve the pericarditis.  If the cause of pericarditis is not known, anti-inflammatory medicines may be used to help decrease the inflammation.  Surgery may be needed. The following are types of surgeries or procedures that may be done to treat pericarditis:  Pericardial window. A pericardial window makes a cut (incision) into the pericardial sac. This allows excess fluid in the pericardium to drain.  Pericardiocentesis. A pericardiocentesis is also known as a pericardial tap. This procedure uses a needle that is guided by X-ray to drain (aspirate) excess fluid from the pericardium.  Pericardiectomy. A pericardiectomy removes part or all of the pericardium. HOME CARE INSTRUCTIONS   Do not smoke. If you smoke, quit. Your caregiver can help you quit smoking.  Maintain a healthy weight.  Follow an exercise program as directed by your health care provider.  You may need to limit your exercising until your symptoms go away.  If  you drink alcohol, do so in moderation.  Eat a heart healthy diet. A registered dietitian can help you learn about healthy food choices.  Keep a list of all your medicines with you at all times. Include the name, dose, how often it is taken and how it is taken. SEEK IMMEDIATE MEDICAL CARE IF:   You have chest pain or feelings of chest pressure.  You have sweating (diaphoresis) when at rest.  You have irregular heartbeats (palpitations).  You have rapid, racing heart beats.  You have unexplained fainting episodes.  You feel sick to your stomach (nausea) or vomiting without cause.  You have unexplained weakness. If you develop any of the symptoms which originally made you seek care, call for local emergency medical help. Do not drive yourself to the hospital.   This information is not intended to replace advice given to you by your health care provider. Make sure you discuss any questions you have with your health care provider.   Document Released: 02/20/2001 Document Revised: 01/11/2015 Document Reviewed: 03/09/2015 Elsevier Interactive Patient Education [employer].

## 2015-12-07 NOTE — ED Notes (Signed)
Pt also reporting intermittent CP.

## 2015-12-07 NOTE — ED Notes (Signed)
Pt reports N/V/D since Monday. Pt alert x4. NAD at this time.

## 2015-12-07 NOTE — ED Provider Notes (Signed)
CSN: [id number]     Arrival date & time 12/07/15  1024 History   First MD Initiated Contact with Patient 12/07/15 1135     Chief Complaint  Patient presents with  . Nausea  . Diarrhea   (Consider location/radiation/quality/duration/timing/severity/associated sxs/prior Treatment) Patient is a 39 y.o. female presenting with diarrhea. The history is provided by the patient. No language interpreter was used.  Diarrhea Associated symptoms: no diaphoresis and no fever     Ms. Kliment is a 40 y.o female with a history of TIA, hyperlipidemia, back pain, and migraines who presents for nausea, vomiting x3, and several episodes of diarrhea for the past 2 days. He states he began having chest pain as though something was stuck in her chest that began this morning while at work. She reports that it radiates to her back. She denies taking any medication for this. He is currently on trazodone to help her sleep, doxycycline for abscesses in the axilla, and atorvastatin for hyperlipidemia. Smokes marijuana on occasion. Denies any recent alcohol use. She denies any diaphoresis, shortness of breath, leg swelling, IV drug use, abdominal pain, or urinary symptoms.   Past Medical History  Diagnosis Date  . Back pain   . Medical history non-contributory   . TIA (transient ischemic attack) 04/2015    questionable, most likely complicated migraine  . 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 Hyperlipidemia    Past Surgical History  Procedure Laterality Date  . Leep     Family History  Problem Relation Age of Onset  . Anesthesia problems Neg Hx   . Other Neg Hx   . Migraines Neg Hx   . Hypertension Mother   . Lupus Mother   . Diabetes Father   . Thyroid disease Sister   . Diabetes Sister   . Cancer Sister     stomach   Social History  Substance Use Topics  . Smoking status: Current Every Day Smoker -- 0.50 packs/day    Last Attempt to Quit: 12/25/2014  . Smokeless tobacco: Never Used     Comment: less than  1/2/  . Alcohol Use: No   OB History    Gravida Para Term Preterm AB TAB SAB Ectopic Multiple Living   2    1  1   1      Review of Systems  Constitutional: Negative for fever and diaphoresis.  Respiratory: Negative for shortness of breath.   Cardiovascular: Positive for chest pain.  Gastrointestinal: Positive for diarrhea.  All other systems reviewed and are negative.     Allergies  Review of patient's allergies indicates no known allergies.  Home Medications   Prior to Admission medications   Medication Sig Start Date End Date Taking? Authorizing Provider  atorvastatin (LIPITOR) 40 MG tablet Take 1 tablet (40 mg total) by mouth daily at 6 PM. To keep cholesterol levels low 04/22/15  Yes Loleta Chance, MD  doxycycline (VIBRA-TABS) 100 MG tablet Take 1 tablet (100 mg total) by mouth 2 (two) times daily. 11/07/15  Yes Corky Sox, MD  MedroxyPROGESTERone Acetate (DEPO-PROVERA IM) Inject into the muscle. Patient unsure of dose   Yes Historical Provider, MD  traZODone (DESYREL) 50 MG tablet Take 1 tablet (50 mg total) by mouth at bedtime. 11/01/15  Yes Kathrynn Ducking, MD  verapamil (CALAN) 40 MG tablet Take 1 tablet (40 mg total) by mouth 2 (two) times daily. 10/20/15  Yes Liberty Handy, MD  albuterol (PROVENTIL HFA;VENTOLIN HFA) 108 (90 Base) MCG/ACT inhaler Inhale 2 puffs  into the lungs every 4 (four) hours as needed for wheezing or shortness of breath (cough). Patient not taking: Reported on 12/07/2015 09/06/15   Melony Overly, MD  aspirin EC 81 MG EC tablet Take 1 tablet (81 mg total) by mouth daily. To prevent another stroke 04/22/15   Loleta Chance, MD  buPROPion [facility] SR) 150 MG 12 hr tablet qdaily x 3 days, then bid 05/04/15 08/04/15  Maryellen Pile, MD  clindamycin (CLINDAGEL) 1 % gel Apply topically 2 (two) times daily. Patient not taking: Reported on 12/07/2015 11/07/15   Corky Sox, MD  ibuprofen (ADVIL,MOTRIN) 800 MG tablet Take 1 tablet (800 mg total) by mouth 3 (three) times  daily. 12/07/15   Aida Lemaire Patel-Mills, PA-C  nicotine (NICODERM CQ - DOSED IN MG/24 HOURS) 21 mg/24hr patch Place 1 patch (21 mg total) onto the skin daily. Patient not taking: Reported on 12/07/2015 10/20/15   Liberty Handy, MD   BP 108/66 mmHg  Pulse 72  Temp(Src) 98 F (36.7 C) (Oral)  Resp 15  SpO2 100% Physical Exam  Constitutional: She is oriented to person, place, and time. She appears well-developed and well-nourished.  HENT:  Head: Normocephalic and atraumatic.  Eyes: Conjunctivae are normal.  Neck: Normal range of motion. Neck supple.  Cardiovascular: Normal rate, regular rhythm and normal heart sounds.   No murmur heard. Regular rate and rhythm. Pain is no worse with leaning forward. No murmur.  Pulmonary/Chest: Effort normal and breath sounds normal. No respiratory distress. She has no wheezes. She has no rales.  Lungs clear to auscultation bilaterally.  Abdominal: Soft. There is no tenderness.  No abdominal tenderness to palpation.  Musculoskeletal: Normal range of motion.  No lower extremity edema. Distal pulses intact.  Neurological: She is alert and oriented to person, place, and time.  Skin: Skin is warm and dry.  Nursing note and vitals reviewed.   ED Course  Procedures (including critical care time) Labs Review Labs Reviewed  COMPREHENSIVE METABOLIC PANEL - Abnormal; Notable for the following:    Glucose, Bld 100 (*)    ALT 8 (*)    All other components within normal limits  URINALYSIS, ROUTINE W REFLEX MICROSCOPIC (NOT AT [facility]) - Abnormal; Notable for the following:    Hgb urine dipstick SMALL (*)    All other components within normal limits  URINE MICROSCOPIC-ADD ON - Abnormal; Notable for the following:    Squamous Epithelial / LPF 0-5 (*)    All other components within normal limits  LIPASE, BLOOD  CBC  PREGNANCY, URINE  I-STAT TROPOININ, ED  Randolm Idol, ED    Imaging Review Dg Chest 2 View  12/07/2015  CLINICAL DATA:  Chest pain with fever.  EXAM: CHEST  2 VIEW COMPARISON:  10/20/2015 FINDINGS: Both lungs are clear. Again noted are small metallic pellets in the left chest and upper abdomen. Heart and mediastinum are within normal limits. The trachea is midline. Negative for a pneumothorax. Bone structures are unremarkable. IMPRESSION: No active cardiopulmonary disease. Electronically Signed   By: Markus Daft M.D.   On: 12/07/2015 11:07   I have personally reviewed and evaluated these images and lab results as part of my medical decision-making.   EKG Interpretation   Date/Time:  Wednesday December 07 2015 10:46:17 EDT Ventricular Rate:  67 PR Interval:  142 QRS Duration: 78 QT Interval:  412 QTC Calculation: 435 R Axis:   39 Text Interpretation:  Normal sinus rhythm ST elevation, consider early  repolarization, pericarditis, or injury  Abnormal ECG No significant change  since last tracing Confirmed by ZACKOWSKI  MD, SCOTT [phone]) on 12/07/2015  10:57:57 AM      MDM   Final diagnoses:  Pericarditis  Nausea vomiting and diarrhea  Patient presents for N/V/D x 2 days. She is also complaining of chest pain radiating to the back that began this morning. Chest x-ray is normal and without mediastinal widening, pneumothorax, or pneumonia. All labs are unremarkable including first troponin. Due to recent start of chest pain, will repeat in 3 hours. I suspect this may be pericarditis which is confirmed by EKG reading.  I discussed this patient with Dr. Audie Pinto who agrees with plan for patient.  Repeat troponin is negative. Patient was given ibuprofen and told to take this daily until follow-up with cardiology. I discussed return precautions with the patient and she agrees with plan. Medications  ondansetron (ZOFRAN) injection 4 mg (4 mg Intravenous Given 12/07/15 1226)  morphine 4 MG/ML injection 4 mg (4 mg Intravenous Given 12/07/15 1226)  sodium chloride 0.9 % bolus 1,000 mL (0 mLs Intravenous Stopped 12/07/15 1357)  ibuprofen  (ADVIL,MOTRIN) tablet 800 mg (800 mg Oral Given 12/07/15 1502)   Filed Vitals:   12/07/15 1500 12/07/15 1517  BP: 108/66   Pulse: 72   Temp:  98 F (36.7 C)  Resp: [id number], PA-C 12/07/15 1651  Leonard Schwartz, MD 12/08/15 2131

## 2015-12-26 ENCOUNTER — Encounter: Payer: Self-pay | Admitting: Cardiovascular Disease

## 2015-12-26 ENCOUNTER — Ambulatory Visit (INDEPENDENT_AMBULATORY_CARE_PROVIDER_SITE_OTHER): Payer: PRIVATE HEALTH INSURANCE | Admitting: Cardiovascular Disease

## 2015-12-26 VITALS — BP 116/80 | HR 70 | Ht 64.0 in | Wt 145.8 lb

## 2015-12-26 DIAGNOSIS — R002 Palpitations: Secondary | ICD-10-CM | POA: Diagnosis not present

## 2015-12-26 DIAGNOSIS — I309 Acute pericarditis, unspecified: Secondary | ICD-10-CM | POA: Diagnosis not present

## 2015-12-26 MED ORDER — COLCHICINE 0.6 MG PO TABS: 0.6000 mg | ORAL_TABLET | Freq: Two times a day (BID) | ORAL | Status: DC

## 2015-12-26 MED ORDER — PROPRANOLOL HCL 10 MG PO TABS: 10.0000 mg | ORAL_TABLET | Freq: Four times a day (QID) | ORAL | Status: DC | PRN

## 2015-12-26 NOTE — Progress Notes (Signed)
Cardiology Office Note   Date:  12/26/2015   ID:  Samantha [name], DOB 05/05/1976, MRN [id number]  PCP:  Default, Provider, MD  Cardiologist:   Thayer Headings, MD   Chief Complaint  Patient presents with  . Follow-up    pericarditis    Problem list 1. Pericarditis 2. Hyperlipidemia 3. Migraine headaches   History of Present Illness: Samantha [name] is a 40 y.o. female who presents for follow-up of an episode of pericarditis. She was in the emergency room several weeks ago with chest pain. Presented with chest pain  Had cough and cold symptoms . Sharp CP.   Does not remember if it increased with deep breath. ECG at that time showed mild ST elevation - looks more like early repol but we cannot rule out pericarditis .   Was given motrin.   Has had some relief.  Still has some occasional CP  Has had cough Still smokes.   Has lots of anxiety .     Past Medical History  Diagnosis Date  . Back pain   . Medical history non-contributory   . TIA (transient ischemic attack) 04/2015    questionable, most likely complicated migraine  . 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 Hyperlipidemia     Past Surgical History  Procedure Laterality Date  . Leep       Current Outpatient Prescriptions  Medication Sig Dispense Refill  . albuterol (PROVENTIL HFA;VENTOLIN HFA) 108 (90 Base) MCG/ACT inhaler Inhale 2 puffs into the lungs every 4 (four) hours as needed for wheezing or shortness of breath (cough). 1 Inhaler 0  . aspirin EC 81 MG EC tablet Take 1 tablet (81 mg total) by mouth daily. To prevent another stroke 90 tablet 3  . atorvastatin (LIPITOR) 40 MG tablet Take 1 tablet (40 mg total) by mouth daily at 6 PM. To keep cholesterol levels low 90 tablet 3  . ibuprofen (ADVIL,MOTRIN) 800 MG tablet Take 1 tablet (800 mg total) by mouth 3 (three) times daily. 21 tablet 0  . MedroxyPROGESTERone Acetate (DEPO-PROVERA IM) Inject into the muscle. Patient unsure of dose    . verapamil (CALAN) 40 MG tablet  Take 1 tablet (40 mg total) by mouth 2 (two) times daily. (Patient not taking: Reported on 12/26/2015) 60 tablet 1   No current facility-administered medications for this visit.    Allergies:   Review of patient's allergies indicates no known allergies.    Social History:  The patient  reports that she has been smoking.  She has never used smokeless tobacco. She reports that she uses illicit drugs (Marijuana). She reports that she does not drink alcohol.   Family History:  The patient's family history includes Cancer in her sister; Diabetes in her father and sister; Hypertension in her mother; Lupus in her mother; Thyroid disease in her sister. There is no history of Anesthesia problems, Other, or Migraines.    ROS:  Please see the history of present illness.    Review of Systems: Constitutional:  denies fever, chills, diaphoresis, appetite change and fatigue.  HEENT: denies photophobia, eye pain, redness, hearing loss, ear pain, congestion, sore throat, rhinorrhea, sneezing, neck pain, neck stiffness and tinnitus.  Respiratory: denies SOB, DOE, cough, chest tightness, and wheezing.  Cardiovascular: admits to chest pain, occasional palpitations and mild  leg swelling.  Gastrointestinal: denies nausea, vomiting, abdominal pain, diarrhea, constipation, blood in stool.  Genitourinary: denies dysuria, urgency, frequency, hematuria, flank pain and difficulty urinating.  Musculoskeletal: denies  myalgias, back pain,  joint swelling, arthralgias and gait problem.   Skin: denies pallor, rash and wound.  Neurological: denies dizziness, seizures, syncope, weakness, light-headedness, numbness and headaches.   Hematological: denies adenopathy, easy bruising, personal or family bleeding history.  Psychiatric/ Behavioral: denies suicidal ideation, mood changes, confusion, nervousness, sleep disturbance and agitation.       All other systems are reviewed and negative.    PHYSICAL EXAM: VS:  BP  116/80 mmHg  Pulse 70  Ht 5\' 4"  (1.626 m)  Wt 145 lb 12.8 oz (66.134 kg)  BMI 25.01 kg/m2 , BMI Body mass index is 25.01 kg/(m^2). GEN: Well nourished, well developed, in no acute distress HEENT: normal Neck: no JVD, carotid bruits, or masses Cardiac: RRR; no murmurs, rubs, or gallops,no edema  Respiratory:  clear to auscultation bilaterally, normal work of breathing GI: soft, nontender, nondistended, + BS MS: no deformity or atrophy Skin: warm and dry, no rash Neuro:  Strength and sensation are intact Psych: normal   EKG:  EKG is not ordered today.    Recent Labs: 10/20/2015: Magnesium 1.9; TSH 2.140 12/07/2015: ALT 8*; BUN 9; Creatinine, Ser 0.71; Hemoglobin 13.7; Platelets 220; Potassium 3.9; Sodium 139    Lipid Panel    Component Value Date/Time   CHOL 187 10/20/2015 0110   TRIG 53 10/20/2015 0110   HDL 55 10/20/2015 0110   CHOLHDL 3.4 10/20/2015 0110   VLDL 11 10/20/2015 0110   LDLCALC 121* 10/20/2015 0110      Wt Readings from Last 3 Encounters:  12/26/15 145 lb 12.8 oz (66.134 kg)  11/07/15 145 lb 9.6 oz (66.044 kg)  11/01/15 145 lb (65.772 kg)      Other studies Reviewed: Additional studies/ records that were reviewed today include: . Review of the above records demonstrates:    ASSESSMENT AND PLAN:  1.  Possible pericarditis- She had symptoms that were either due to pericarditis or pleurisy. Her EKG was fairly unimpressive. She does have some mild ST segment elevation but I think it's more consistent with early repolarization changes. She's had the same early repolarization changes from previous EKGs. She does not describe symptoms that are necessarily consistent with pericarditis. She does continued have some pains. We will give her a prescription for colchicine 0.6 mg twice a day for the next 2 weeks. She can get further prescriptions from her primary medical doctor.  2. Palpitations. Normal 30 day monitor  Suspect that she has benign premature  ventricular contractions. I've offered to give her some propranolol to take on an as-needed basis. She'll need to get further refills from her primary medical doctor.  I'll see her on an as-needed basis.   Current medicines are reviewed at length with the patient today.  The patient does not have concerns regarding medicines.  The following changes have been made:  no change  Labs/ tests ordered today include:  No orders of the defined types were placed in this encounter.     Disposition:   FU with me as needed.      Taylin Leder, Wonda Cheng, MD  12/26/2015 9:10 AM    [facility] [place], [place], [place]  [address] Phone: [phone]; Fax: [phone]   [facility]  [address] [place], [place]  [address] [phone]   Fax [phone]

## 2015-12-26 NOTE — Patient Instructions (Signed)
Medication Instructions:  START Colchicine 0.6 mg twice daily YOU MAY USE Propranolol 10 mg as needed up to 4 times per day for palpitations   Labwork: None Ordered   Testing/Procedures: None Ordered   Follow-Up: Your physician recommends that you schedule a follow-up appointment in: as needed with Dr. Acie Fredrickson   If you need a refill on your cardiac medications before your next appointment, please call your pharmacy.   Thank you for choosing [facility]! Christen Bame, RN [phone]

## 2016-01-06 MED FILL — VERAPAMIL 40 MG TABLET: 40 | 30 days supply | Qty: 60 | Fill #0

## 2016-01-06 MED FILL — PROPRANOLOL 10 MG TABLET: 10 | 7 days supply | Qty: 30 | Fill #0

## 2016-01-30 ENCOUNTER — Encounter: Payer: Self-pay | Admitting: Adult Health

## 2016-01-30 ENCOUNTER — Ambulatory Visit (INDEPENDENT_AMBULATORY_CARE_PROVIDER_SITE_OTHER): Payer: PRIVATE HEALTH INSURANCE | Admitting: Adult Health

## 2016-01-30 VITALS — BP 90/68 | HR 88 | Temp 99.6°F | Ht 64.0 in | Wt 146.0 lb

## 2016-01-30 DIAGNOSIS — M25512 Pain in left shoulder: Secondary | ICD-10-CM | POA: Diagnosis not present

## 2016-01-30 DIAGNOSIS — G43109 Migraine with aura, not intractable, without status migrainosus: Secondary | ICD-10-CM | POA: Diagnosis not present

## 2016-01-30 DIAGNOSIS — M79622 Pain in left upper arm: Secondary | ICD-10-CM

## 2016-01-30 NOTE — Progress Notes (Signed)
I have read the note, and I agree with the clinical assessment and plan.  WILLIS,CHARLES KEITH   

## 2016-01-30 NOTE — Patient Instructions (Signed)
Continue Verapamil  Get established with a primary care: Dr. Raliegh Scarlet [phone]

## 2016-01-30 NOTE — Progress Notes (Signed)
Samantha [name]: Samantha Samantha [name] DOB: 24-Feb-1976  REASON FOR VISIT: follow up- migraine headaches HISTORY FROM: Samantha [name]  HISTORY OF PRESENT ILLNESS: Samantha Samantha [name] is a 40 year old female with a history of migraine headaches. She returns today for follow-up. She was started on verapamil. She reports that this is been working well for her headaches. She states that she rarely has a headache now. She denies any strokelike symptoms. She is taking aspirin daily. She states that she woke up this morning not feeling well. She states that she was told she had "reversed acne" in her axilla. She reports that she was given cream in Samantha emergency room however she cannot afford this. She does not have a primary care provider. She states that she gets "tender knots" in her axilla. She states that if she squeezes them it releases pus. She states that she also has "blackheads" in Samantha axilla. She reports that when she gets these hard knots that's when she starts to feel bad. She returns today for an evaluation.  HISTORY 11/01/15: Samantha Samantha [name] is a 40 year old right-handed black female with a history of migraine headache that has been relatively frequent over Samantha last 3 years. Samantha Samantha [name] has had headache episodes that generally occur around Samantha left frontal area. Samantha Samantha [name] indicates that Samantha headache may be associated with white spots in Samantha vision, she may get dizzy, and have cognitive clouding during Samantha headache. Samantha headache may last all day long. She indicates that she is getting headaches about every other day, and Samantha headaches may be associated with lack of good sleep. Samantha Samantha [name] will get to sleep well, but she wakes up with frequent dreaming, and cannot get back to sleep. Samantha Samantha [name] has had 2 episodes of strokelike symptoms with her headaches, one in August 2016, and one recently in February 2017. Samantha episodes are associated with numbness of Samantha right face and right arm, some weakness or clumsiness on that side, speech issues,  and double vision. Samantha Samantha [name] feels confused during Samantha episodes. Samantha Samantha [name] has had very thorough workups that have included MRI of Samantha brain, MRA of Samantha head, 2-D echocardiogram, carotid Doppler studies, 30 day cardiac monitor study, and a hypercoagulable state workup on blood work. All of these evaluations were negative. Samantha Samantha [name] smokes cigarettes, and she is on birth control, primarily progesterone containing. Samantha Samantha [name] does smoke marijuana on occasion. She has a history of asthma. Samantha Samantha [name] was placed on verapamil, but she has not yet started this medication. She was told to go on low-dose aspirin, she has not done this yet. She continues to smoke. Samantha Samantha [name] will take Advil for Samantha headache with some benefit. She denies any difficulty with balance or difficulty controlling Samantha bowels or Samantha bladder. She denies any blackout episodes. She does not have a primary care physician. She comes to this office for an evaluation.  REVIEW OF SYSTEMS: Out of a complete 14 system review of symptoms, Samantha Samantha [name] complains only of Samantha following symptoms, and all other reviewed systems are negative.  Joint pain, joint swelling, back pain, aching muscles, depression, nervous/anxious, snoring, insomnia, nausea, vomiting  ALLERGIES: No Known Allergies  HOME MEDICATIONS: Outpatient Prescriptions Prior to Visit  Medication Sig Dispense Refill  . albuterol (PROVENTIL HFA;VENTOLIN HFA) 108 (90 Base) MCG/ACT inhaler Inhale 2 puffs into Samantha lungs every 4 (four) hours as needed for wheezing or shortness of breath (cough). 1 Inhaler 0  . aspirin EC 81 MG EC tablet Take 1 tablet (81 mg total)  by mouth daily. To prevent another stroke 90 tablet 3  . atorvastatin (LIPITOR) 40 MG tablet Take 1 tablet (40 mg total) by mouth daily at 6 PM. To keep cholesterol levels low 90 tablet 3  . ibuprofen (ADVIL,MOTRIN) 800 MG tablet Take 1 tablet (800 mg total) by mouth 3 (three) times daily. 21 tablet 0  . MedroxyPROGESTERone  Acetate (DEPO-PROVERA IM) Inject into Samantha muscle. Samantha [name] unsure of dose    . propranolol (INDERAL) 10 MG tablet Take 1 tablet (10 mg total) by mouth 4 (four) times daily as needed. 30 tablet 0  . verapamil (CALAN) 40 MG tablet Take 1 tablet (40 mg total) by mouth 2 (two) times daily. 60 tablet 1  . colchicine 0.6 MG tablet Take 1 tablet (0.6 mg total) by mouth 2 (two) times daily. 30 tablet 0   No facility-administered medications prior to visit.    PAST MEDICAL HISTORY: Past Medical History  Diagnosis Date  . Back pain   . Medical history non-contributory   . TIA (transient ischemic attack) 04/2015    questionable, most likely complicated migraine  . 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 Hyperlipidemia     PAST SURGICAL HISTORY: Past Surgical History  Procedure Laterality Date  . Leep      FAMILY HISTORY: Family History  Problem Relation Age of Onset  . Anesthesia problems Neg Hx   . Other Neg Hx   . Migraines Neg Hx   . Hypertension Mother   . Lupus Mother   . Diabetes Father   . Thyroid disease Sister   . Diabetes Sister   . Cancer Sister     stomach    SOCIAL HISTORY: Social History   Social History  . Marital Status: Single    Spouse Name: N/A  . Number of Children: 1  . Years of Education: 9   Occupational History  . Not on file.   Social History Main Topics  . Smoking status: Current Every Day Smoker -- 0.50 packs/day    Last Attempt to Quit: 12/25/2014  . Smokeless tobacco: Never Used     Comment: less than 1/2/  . Alcohol Use: No  . Drug Use: Yes    Special: Marijuana  . Sexual Activity: Not on file   Other Topics Concern  . Not on file   Social History Narrative   Samantha [name] drinks sodas all day long.   Samantha [name] is right handed.       PHYSICAL EXAM  Filed Vitals:   01/30/16 1525  BP: 90/68  Pulse: 88  Temp: 99.6 F (37.6 C)  TempSrc: Oral  Height: 5\' 4"  (1.626 m)  Weight: 146 lb (66.225 kg)   Body mass index is 25.05  kg/(m^2).  Generalized: Well developed, in no acute distress  Skin: 3 hard tender nodules in Samantha right axilla- 1 nodule is slightly erythematous- with multiple open comedones.  Neurological examination  Mentation: Alert oriented to time, place, history taking. Follows all commands speech and language fluent Cranial nerve II-XII: Pupils were equal round reactive to light. Extraocular movements were full, visual field were full on confrontational test. Facial sensation and strength were normal. Uvula tongue midline. Head turning and shoulder shrug  were normal and symmetric. Motor: Samantha motor testing reveals 5 over 5 strength of all 4 extremities. Good symmetric motor tone is noted throughout.  Sensory: Sensory testing is intact to soft touch on all 4 extremities. No evidence of extinction is noted.  Coordination: Cerebellar testing reveals good finger-nose-finger and  heel-to-shin bilaterally.  Gait and station: Gait is normal. Tandem gait is normal. Romberg is negative. No drift is seen.  Reflexes: Deep tendon reflexes are symmetric and normal bilaterally.   DIAGNOSTIC DATA (LABS, IMAGING, TESTING) - I reviewed Samantha [name] records, labs, notes, testing and imaging myself where available.  Lab Results  Component Value Date   WBC 6.5 12/07/2015   HGB 13.7 12/07/2015   HCT 40.5 12/07/2015   MCV 92.3 12/07/2015   PLT 220 12/07/2015      Component Value Date/Time   NA 139 12/07/2015 1053   K 3.9 12/07/2015 1053   CL 107 12/07/2015 1053   CO2 25 12/07/2015 1053   GLUCOSE 100* 12/07/2015 1053   BUN 9 12/07/2015 1053   CREATININE 0.71 12/07/2015 1053   CALCIUM 9.4 12/07/2015 1053   PROT 7.0 12/07/2015 1053   ALBUMIN 3.8 12/07/2015 1053   AST 18 12/07/2015 1053   ALT 8* 12/07/2015 1053   ALKPHOS 60 12/07/2015 1053   BILITOT 0.4 12/07/2015 1053   GFRNONAA >60 12/07/2015 1053   GFRAA >60 12/07/2015 1053   Lab Results  Component Value Date   CHOL 187 10/20/2015   HDL 55 10/20/2015    LDLCALC 121* 10/20/2015   TRIG 53 10/20/2015   CHOLHDL 3.4 10/20/2015   Lab Results  Component Value Date   HGBA1C 5.9* 10/20/2015    Lab Results  Component Value Date   TSH 2.140 10/20/2015      ASSESSMENT AND PLAN 40 y.o. year old female  has a past medical history of Back pain; Medical history non-contributory; TIA (transient ischemic attack) (04/2015); Basilar migraine (11/01/2015); and Hyperlipidemia. here with:  1. Migraine headaches 2. Open comedones 3. Pain axilla   Samantha Samantha [name] will continue on verapamil for headache prophylaxis. She is doing well on this medication. She will need to monitor her blood pressure. She will continue on aspirin. Samantha Samantha [name] does have discomfort in Samantha axilla due to painful nodules. She does not have a primary care provider. I have given her Samantha number for [facility] primary care with Dr. Raliegh Scarlet. She will call and make an appointment. She will follow-up in our office for headaches in 6 months.     Ward Givens, MSN, NP-C 01/30/2016, 3:39 PM [facility] [address] [place], [place] [address] [phone]

## 2016-01-31 ENCOUNTER — Ambulatory Visit (INDEPENDENT_AMBULATORY_CARE_PROVIDER_SITE_OTHER): Payer: PRIVATE HEALTH INSURANCE | Admitting: Family Medicine

## 2016-01-31 ENCOUNTER — Encounter: Payer: Self-pay | Admitting: Family Medicine

## 2016-01-31 VITALS — BP 112/78 | HR 87 | Ht 64.0 in | Wt 148.0 lb

## 2016-01-31 DIAGNOSIS — L732 Hidradenitis suppurativa: Secondary | ICD-10-CM

## 2016-01-31 DIAGNOSIS — Z720 Tobacco use: Secondary | ICD-10-CM | POA: Diagnosis not present

## 2016-01-31 DIAGNOSIS — F17200 Nicotine dependence, unspecified, uncomplicated: Secondary | ICD-10-CM

## 2016-01-31 DIAGNOSIS — F1290 Cannabis use, unspecified, uncomplicated: Secondary | ICD-10-CM

## 2016-01-31 DIAGNOSIS — E785 Hyperlipidemia, unspecified: Secondary | ICD-10-CM | POA: Diagnosis not present

## 2016-01-31 DIAGNOSIS — F1210 Cannabis abuse, uncomplicated: Secondary | ICD-10-CM | POA: Diagnosis not present

## 2016-01-31 DIAGNOSIS — R7303 Prediabetes: Secondary | ICD-10-CM

## 2016-01-31 DIAGNOSIS — M549 Dorsalgia, unspecified: Secondary | ICD-10-CM | POA: Insufficient documentation

## 2016-01-31 DIAGNOSIS — F1510 Other stimulant abuse, uncomplicated: Secondary | ICD-10-CM | POA: Insufficient documentation

## 2016-01-31 MED ORDER — DOXYCYCLINE HYCLATE 100 MG PO TABS: 100.0000 mg | ORAL_TABLET | Freq: Two times a day (BID) | ORAL | Status: DC

## 2016-01-31 NOTE — Progress Notes (Signed)
Marjory Sneddon, D.O. Family Medicine Physician Location: Primary Care at [facility]     Subjective:    CC: New pt  HPI: Samantha [name] is a  40 y.o. female who presents to [place] at [facility] today to establish care at the kind referral of one of my associates, Ward Givens, MSN, NP-C, of [facility], after the patient told her she did not have a primary care physician.  However, it does appear the patient was sent to a Dr. Charlynn Grimes and attending physician Dr. Oval Linsey both of the internal medicine department to establish a PCP, in August 2016 after she was seen in the hospital for headaches associated with CVA-like symptoms on 8\11\16.   She has also had follow-ups at that internal medicine clinic with the latest one being on 11/07/2015 for treatment of her chronic hidradenitis suppurativa condition.   At that time she took the oral antibiotic but yet could not afford the cream\ointment.     TODAY, patient tells me she is at my clinic today due to boils in her bilateral axilla, which are chronic in nature but yet the left axilla region acutely became exquisitely tender 2 days ago.  They drain and one in particular is very painful to touch today.  She denies any fever, chills, nausea, vomiting or diarrhea to me today.     She does state that these boils make her feel anxious, which is an ongoing concern for her, but she has never been treated with medicines for any mood conditions in the past, and says it's never too bad that she needs something for them.   She has suffered from anxious feelings and occasionally "attacks" for 3-4 years now.  She has not taken anything for it and usually can "talk herself out of it".   She has one child at home with her and does have excessive worry about her own health, and nothing really else.    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  Has extremely poor diet and eats a "couple times a day" at   [employer], [employer], [employer] and other various unhealthy fast food establishments.  She eats a lot of fried foods and only drinks Dr. Malachi Bonds and [facility] throughout the day of at least 6-8 cans.  She does not exercise.  She smokes marijuana most days of the week. Denies any other illicit drug use. She also complains of chronic back pain and chronic generalized myalgias.     Past Medical History  Diagnosis Date  . Back pain   . Medical history non-contributory   . TIA (transient ischemic attack) 04/2015    questionable, most likely complicated migraine  . 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 Hyperlipidemia     Past Surgical History  Procedure Laterality Date  . Leep      @[facility] @  History  Drug Use  . Yes  . Special: Marijuana  ,  History  Alcohol Use No  ,  History  Smoking status  . Current Every Day Smoker -- 0.50 packs/day for 17 years  . Last Attempt to Quit: 12/25/2014  Smokeless tobacco  . Never Used    Comment: less than 1/2/  ,  History  Sexual Activity  . Sexual Activity: Not on file    Patient's Medications  New Prescriptions   No medications on file  Previous Medications   ATORVASTATIN (LIPITOR) 40 MG TABLET    Take 1 tablet (  40 mg total) by mouth daily at 6 PM. To keep cholesterol levels low   MEDROXYPROGESTERONE ACETATE (DEPO-PROVERA IM)    Inject into the muscle. Reported on 01/31/2016   PROPRANOLOL (INDERAL) 10 MG TABLET    Take 1 tablet (10 mg total) by mouth 4 (four) times daily as needed.   VERAPAMIL (CALAN) 40 MG TABLET    Take 1 tablet (40 mg total) by mouth 2 (two) times daily.  Modified Medications   No medications on file  Discontinued Medications   ALBUTEROL (PROVENTIL HFA;VENTOLIN HFA) 108 (90 BASE) MCG/ACT INHALER    Inhale 2 puffs into the lungs every 4 (four) hours as needed for wheezing or shortness of breath (cough).   ASPIRIN EC 81 MG EC TABLET    Take 1 tablet (81 mg total) by mouth daily. To prevent another stroke   IBUPROFEN  (ADVIL,MOTRIN) 800 MG TABLET    Take 1 tablet (800 mg total) by mouth 3 (three) times daily.    Review of patient's allergies indicates no known allergies.  Review of Systems: Full 14 point ROS performed via "adult medical history form".  Negative except for Chronic joint pain, chronic back pain, chronic myalgias, anxious, depressed mood, and sleep difficulties.   Denies current sx of headache, visual changes, nausea, vomiting, diarrhea, constipation, dizziness, abdominal pain, fevers, chills, night sweats, weight loss, chest pain, shortness of breath, visual or auditory hallucinations, SI/HI.    Objective:   Blood pressure 112/78, pulse 87, height 5\' 4"  (1.626 m), weight 148 lb (67.132 kg), SpO2 98 %.  General: Well Developed, well nourished, and in no acute distress.  Neuro: Alert and oriented x3, extra-ocular muscles intact HEENT: Normocephalic, atraumatic, pupils equal round reactive to light, neck supple, no Carotid bruits or JVD Skin: Bilateral axillas with evidence of hard and nonpainful subcutaneous nodules and carbuncles, multiple scars from healed boils. One less than 1 cm boil in left axilla appears mildly erythematous and is tender to palpation. Indurated and no pustule or fluctuance appreciated Cardiac: Regular rate and rhythm, no murmurs rubs or gallops.  Respiratory: Essentially clear to auscultation bilaterally but distant and coarse breath sounds throughout. Not using accessory muscles, speaking in full sentences.  Abdominal: Soft, not grossly distended Musculoskeletal: Ambulates w/o diff, FROM * 4 ext.  Vasc: less 2 sec cap RF, warm and pink  Psych:  No HI/SI, judgement and insight good. Does not appear anxious or depressed, interactions appropriate.    Impression and Recommendations:    The patient was counselled, risk factors were discussed, anticipatory guidance given. Hidradenitis suppurativa Take doxycycline 100mg  twice daily for 10 days.  Recommend Hibiclens  daily. Pt states she cannot afford any abx ointments as she was given in past and it is unlikely that she will get the Hibiclens.  Handouts given on preventative care of her hidradenitis suppurativa once over this acute episode. Denies history of MRSA.    Smoker Counseling done; pre-contemplative stages.  Smokes Marijuana daily and doesn't plan to quit anytime soon.  Hyperlipidemia Cont statin.  Pt declines that her body myalgias are related to when she started the med.  Cont for now. Diet and lifestyle counseling done  Marijuana use Counseling done. Not ready to consider quitting.  Caffeine abuse, continuous - recommend very slowly weening down on caffeine use... By one less can per week ( now at 6-7 cans of caffeine /day ) and inc water intake.   - Extensive counseling done on prudent diet and exercise\ activity levels and an  overall healthier lifestyle.   Importance of this stressed in terms of it positively impacting her mood, multiple physical symptoms and multiple health concerns.  Back pain Explained to pt that I do not treat chronic pain and she declines referral to pain doc at this time.    Prediabetes Will need routine monitoring as A1c was just barely elevated at 5.8 in the recent past; health maintenance screenings and appropriate lab work to be done in future.   Make follow-up with whom patient will deem to be her PCP in the future for a CPE.

## 2016-01-31 NOTE — Assessment & Plan Note (Addendum)
Will need routine monitoring as A1c was just barely elevated at 5.8 in the recent past; health maintenance screenings and appropriate lab work to be done in future.   Make follow-up with whom patient will deem to be her PCP in the future for a CPE.

## 2016-01-31 NOTE — Assessment & Plan Note (Signed)
Counseling done. Not ready to consider quitting.

## 2016-01-31 NOTE — Assessment & Plan Note (Addendum)
Explained to pt that I do not treat chronic pain and she declines referral to pain doc at this time.

## 2016-01-31 NOTE — Assessment & Plan Note (Addendum)
Counseling done; pre-contemplative stages.  Smokes Marijuana daily and doesn't plan to quit anytime soon.

## 2016-01-31 NOTE — Assessment & Plan Note (Addendum)
-   recommend very slowly weening down on caffeine use... By one less can per week ( now at 6-7 cans of caffeine /day ) and inc water intake.   - Extensive counseling done on prudent diet and exercise\ activity levels and an overall healthier lifestyle.   Importance of this stressed in terms of it positively impacting her mood, multiple physical symptoms and multiple health concerns.

## 2016-01-31 NOTE — Assessment & Plan Note (Addendum)
Take doxycycline 100mg  twice daily for 10 days.  Recommend Hibiclens daily. Pt states she cannot afford any abx ointments as she was given in past and it is unlikely that she will get the Hibiclens.  Handouts given on preventative care of her hidradenitis suppurativa once over this acute episode. Denies history of MRSA.

## 2016-01-31 NOTE — Patient Instructions (Signed)
-Take all antibiotics for boils, f/up one month  Hidradenitis Suppurativa Hidradenitis suppurativa is a long-term (chronic) skin disease that starts with blocked sweat glands or hair follicles. Bacteria may grow in these blocked openings of your skin. Hidradenitis suppurativa is like a severe form of acne that develops in areas of your body where acne would be unusual. It is most likely to affect the areas of your body where skin rubs against skin and becomes moist. This includes your:  Underarms.  Groin.  Genital areas.  Buttocks.  Upper thighs.  Breasts. Hidradenitis suppurativa may start out with small pimples. The pimples can develop into deep sores that break open (rupture) and drain pus. Over time your skin may thicken and become scarred. Hidradenitis suppurativa cannot be passed from person to person.  CAUSES  The exact cause of hidradenitis suppurativa is not known. This condition may be due to:  Female and female hormones. The condition is rare before and after puberty.  An overactive body defense system (immune system). Your immune system may overreact to the blocked hair follicles or sweat glands and cause swelling and pus-filled sores. RISK FACTORS You may have a higher risk of hidradenitis suppurativa if you:  Are a woman.  Are between ages 32 and 41.  Have a family history of hidradenitis suppurativa.  Have a personal history of acne.  Are overweight.  Smoke.  Take the drug lithium. SIGNS AND SYMPTOMS  The first signs of an outbreak are usually painful skin bumps that look like pimples. As the condition progresses:  Skin bumps may get bigger and grow deeper into the skin.  Bumps under the skin may rupture and drain smelly pus.  Skin may become itchy and infected.  Skin may thicken and scar.  Drainage may continue through tunnels under the skin (fistulas).  Walking and moving your arms can become painful. DIAGNOSIS  Your health care provider may  diagnose hidradenitis suppurativa based on your medical history and your signs and symptoms. A physical exam will also be done. You may need to see a health care provider who specializes in skin diseases (dermatologist). You may also have tests done to confirm the diagnosis. These can include:  Swabbing a sample of pus or drainage from your skin so it can be sent to the lab and tested for infection.  Blood tests to check for infection. TREATMENT  The same treatment will not work for everybody with hidradenitis suppurativa. Your treatment will depend on how severe your symptoms are. You may need to try several treatments to find what works best for you. Part of your treatment may include cleaning and bandaging (dressing) your wounds. You may also have to take medicines, such as the following:  Antibiotics.  Acne medicines.  Medicines to block or suppress the immune system.  A diabetes medicine (metformin) is sometimes used to treat this condition.  For women, birth control pills can sometimes help relieve symptoms. You may need surgery if you have a severe case of hidradenitis suppurativa that does not respond to medicine. Surgery may involve:   Using a laser to clear the skin and remove hair follicles.  Opening and draining deep sores.  Removing the areas of skin that are diseased and scarred. HOME CARE INSTRUCTIONS  Learn as much as you can about your disease, and work closely with your health care providers.  Take medicines only as directed by your health care provider.  If you were prescribed an antibiotic medicine, finish it all even if  you start to feel better.  If you are overweight, losing weight may be very helpful. Try to reach and maintain a healthy weight.  Do not use any tobacco products, including cigarettes, chewing tobacco, or electronic cigarettes. If you need help quitting, ask your health care provider.  Do not shave the areas where you get hidradenitis  suppurativa.  Do not wear deodorant.  Wear loose-fitting clothes.  Try not to overheat and get sweaty.  Take a daily bleach bath as directed by your health care provider.  Fill your bathtub halfway with water.  Pour in  cup of unscented household bleach.  Soak for 5-10 minutes.  Cover sore areas with a warm, clean washcloth (compress) for 5-10 minutes. SEEK MEDICAL CARE IF:   You have a flare-up of hidradenitis suppurativa.  You have chills or a fever.  You are having trouble controlling your symptoms at home.   This information is not intended to replace advice given to you by your health care provider. Make sure you discuss any questions you have with your health care provider.   Document Released: 04/10/2004 Document Revised: 09/17/2014 Document Reviewed: 11/27/2013 Elsevier Interactive Patient Education 2016 Elsevier Inc.   Doxycycline delayed-release capsules What is this medicine? DOXYCYCLINE (dox i SYE kleen) is a tetracycline antibiotic. It is used to treat certain kinds of bacterial infections, Lyme disease, and malaria. It will not work for colds, flu, or other viral infections. This medicine may be used for other purposes; ask your health care provider or pharmacist if you have questions. What should I tell my health care provider before I take this medicine? They need to know if you have any of these conditions: -bowel disease like colitis -liver disease -long exposure to sunlight like working outdoors -an unusual or allergic reaction to doxycycline, tetracycline antibiotics, other medicines, foods, dyes, or preservatives -pregnant or trying to get pregnant -breast-feeding How should I use this medicine? Take this medicine by mouth with a full glass of water. Follow the directions on the prescription label. Do not crush or chew. The capsules may be opened and the pellets sprinkled on applesauce. Swallow the pellets whole without chewing. Follow with an 8  ounce glass of water to help you swallow all the pellets. Do not prepare a dose and store for later use. The applesauce mixture should be taken immediately after you prepare it. It is best to take this medicine without other food, but if it upsets your stomach take it with food. Take your medicine at regular intervals. Do not take your medicine more often than directed. Take all of your medicine as directed even if you think your are better. Do not skip doses or stop your medicine early. Talk to your pediatrician regarding the use of this medicine in children. While this drug may be prescribed for selected conditions, precautions do apply. Overdosage: If you think you have taken too much of this medicine contact a poison control center or emergency room at once. NOTE: This medicine is only for you. Do not share this medicine with others. What if I miss a dose? If you miss a dose, take it as soon as you can. If it is almost time for your next dose, take only that dose. Do not take double or extra doses. What may interact with this medicine? -antacids -barbiturates -birth control pills -bismuth subsalicylate -carbamazepine -methoxyflurane -other antibiotics -phenytoin -vitamins that contain iron -warfarin This list may not describe all possible interactions. Give your health care provider a  list of all the medicines, herbs, non-prescription drugs, or dietary supplements you use. Also tell them if you smoke, drink alcohol, or use illegal drugs. Some items may interact with your medicine. What should I watch for while using this medicine? Tell your doctor or health care professional if your symptoms do not improve. Do not treat diarrhea with over the counter products. Contact your doctor if you have diarrhea that lasts more than 2 days or if it is severe and watery. Do not take this medicine just before going to bed. It may not dissolve properly when you lay down and can cause pain in your throat.  Drink plenty of fluids while taking this medicine to also help reduce irritation in your throat. This medicine can make you more sensitive to the sun. Keep out of the sun. If you cannot avoid being in the sun, wear protective clothing and use sunscreen. Do not use sun lamps or tanning beds/booths. If you are being treated for a sexually transmitted infection, avoid sexual contact until you have finished your treatment. Your sexual partner may also need treatment. Avoid antacids, aluminum, calcium, magnesium, and iron products for 4 hours before and 2 hours after taking a dose of this medicine. Birth control pills may not work properly while you are taking this medicine. Talk to your doctor about using an extra method of birth control. If you are using this medicine to prevent malaria, you should still protect yourself from contact with mosquitos. Stay in screened-in areas, use mosquito nets, keep your body covered, and use an insect repellent. What side effects may I notice from receiving this medicine? Side effects that you should report to your doctor or health care professional as soon as possible: -allergic reactions like skin rash, itching or hives, swelling of the face, lips, or tongue -difficulty breathing -fever -itching in the rectal or genital area -pain on swallowing -redness, blistering, peeling or loosening of the skin, including inside the mouth -severe stomach pain or cramps -unusual bleeding or bruising -unusually weak or tired -yellowing of the eyes or skin Side effects that usually do not require medical attention (report to your doctor or health care professional if they continue or are bothersome): -diarrhea -loss of appetite -nausea, vomiting This list may not describe all possible side effects. Call your doctor for medical advice about side effects. You may report side effects to FDA at 1-800-FDA-1088. Where should I keep my medicine? Keep out of the reach of  children. Store at room temperature, below 25 degrees C (77 degrees F). Protect from light. Keep container tightly closed. Throw away any unused medicine after the expiration date. Taking this medicine after the expiration date can make you seriously ill. NOTE: This sheet is a summary. It may not cover all possible information. If you have questions about this medicine, talk to your doctor, pharmacist, or health care provider.    2016, Elsevier/Gold Standard. (2014-12-17 12:05:23)

## 2016-01-31 NOTE — Assessment & Plan Note (Signed)
Cont statin.  Pt declines that her body myalgias are related to when she started the med.  Cont for now. Diet and lifestyle counseling done

## 2016-03-01 ENCOUNTER — Ambulatory Visit: Payer: PRIVATE HEALTH INSURANCE | Admitting: Family Medicine

## 2016-03-07 ENCOUNTER — Ambulatory Visit: Payer: PRIVATE HEALTH INSURANCE | Admitting: Family Medicine

## 2016-06-26 ENCOUNTER — Encounter: Payer: Self-pay | Admitting: Family Medicine

## 2016-06-26 ENCOUNTER — Ambulatory Visit (INDEPENDENT_AMBULATORY_CARE_PROVIDER_SITE_OTHER): Payer: PRIVATE HEALTH INSURANCE | Admitting: Family Medicine

## 2016-06-26 VITALS — BP 151/85 | HR 90 | Temp 99.0°F | Ht 64.0 in | Wt 139.7 lb

## 2016-06-26 DIAGNOSIS — L732 Hidradenitis suppurativa: Secondary | ICD-10-CM

## 2016-06-26 DIAGNOSIS — F4323 Adjustment disorder with mixed anxiety and depressed mood: Secondary | ICD-10-CM | POA: Insufficient documentation

## 2016-06-26 DIAGNOSIS — F329 Major depressive disorder, single episode, unspecified: Secondary | ICD-10-CM | POA: Diagnosis not present

## 2016-06-26 DIAGNOSIS — R4589 Other symptoms and signs involving emotional state: Secondary | ICD-10-CM | POA: Insufficient documentation

## 2016-06-26 DIAGNOSIS — Z716 Tobacco abuse counseling: Secondary | ICD-10-CM | POA: Diagnosis not present

## 2016-06-26 DIAGNOSIS — F17200 Nicotine dependence, unspecified, uncomplicated: Secondary | ICD-10-CM | POA: Diagnosis not present

## 2016-06-26 MED ORDER — PAROXETINE HCL ER 12.5 MG PO TB24: 12.5000 mg | ORAL_TABLET | Freq: Every day | ORAL | 1 refills | Status: DC

## 2016-06-26 MED ORDER — DOXYCYCLINE HYCLATE 100 MG PO TABS: 100.0000 mg | ORAL_TABLET | Freq: Two times a day (BID) | ORAL | 0 refills | Status: DC

## 2016-06-26 MED ORDER — CLINDAMYCIN PHOS-BENZOYL PEROX 1-5 % EX GEL: Freq: Two times a day (BID) | CUTANEOUS | 3 refills | Status: DC

## 2016-06-26 NOTE — Progress Notes (Signed)
Impression and Recommendations:    1. Hidradenitis suppurativa   2. Depressed mood   3. Smoker   4. Tobacco abuse counseling    Handout on hidradenitis suppurativa from up-to-date given to patient.   care of wounds.  oral antibiotic tablets of doxycycline, clindamycin gel daily. Use Tylenol when necessary fevers, she denies need for any anti-nausea medicines- not that bad.   She will use Mylanta or Maalox over-the-counter as needed.    If she develops any worsening fever /chills despite treatment she will go to the urgent care. Use over-the-counter Hibiclens twice daily. Await culture results.  We'll send to dermatology for future treatment and possible intralesional injections etc- Since this is a debilitated patient and she is very frustrated and depressed about it and reoccuring episodes which we cannot get good control over.  We will start Paxil for depression. Please go to a counselor to speak with them at least once weekly for your depression. I will see back in 3-4 weeks to see how you're doing on this medicine which should help for it with sadness feelings   Suicide hotline information given.  For pain use ibuprofen or Aleve over-the-counter.   Orders Placed This Encounter  Procedures  . Anaerobic and Aerobic Culture  . Ambulatory referral to Dermatology    Referral Priority:   Routine    Referral Type:   Consultation    Referral Reason:   Specialty Services Required    Requested Specialty:   Dermatology    Number of Visits Requested:   1  . Ambulatory referral to Psychology    Referral Priority:   Routine    Referral Type:   Psychiatric    Referral Reason:   Specialty Services Required    Requested Specialty:   Psychology    Number of Visits Requested:   1     New Prescriptions   CLINDAMYCIN-BENZOYL PEROXIDE (BENZACLIN) GEL    Apply topically 2 (two) times daily.   DOXYCYCLINE (VIBRA-TABS) 100 MG TABLET    Take 1 tablet (100 mg total) by mouth 2 (two)  times daily.   PAROXETINE (PAXIL CR) 12.5 MG 24 HR TABLET    Take 1 tablet (12.5 mg total) by mouth daily.    Modified Medications   No medications on file    Discontinued Medications   ATORVASTATIN (LIPITOR) 40 MG TABLET    Take 1 tablet (40 mg total) by mouth daily at 6 PM. To keep cholesterol levels low   DOXYCYCLINE (VIBRA-TABS) 100 MG TABLET    Take 1 tablet (100 mg total) by mouth 2 (two) times daily.   MEDROXYPROGESTERONE ACETATE (DEPO-PROVERA IM)    Inject into the muscle. Reported on 01/31/2016   PROPRANOLOL (INDERAL) 10 MG TABLET    Take 1 tablet (10 mg total) by mouth 4 (four) times daily as needed.   VERAPAMIL (CALAN) 40 MG TABLET    Take 1 tablet (40 mg total) by mouth 2 (two) times daily.   The patient was counseled, risk factors were discussed, anticipatory guidance given.  Gross side effects, risk and benefits, and alternatives of medications and treatment plan in general discussed with patient.  Patient is aware that all medications have potential side effects and we are unable to predict every side effect or drug-drug interaction that may occur.   Patient will call with any questions prior to using medication if they have concerns.  Expresses verbal understanding and consents to current therapy and treatment regimen.  No  barriers to understanding were identified.  Red flag symptoms and signs discussed in detail.  Patient expressed understanding regarding what to do in case of emergency\urgent symptoms  Return for 3-4 wks- f/up mood/ starting paxil.  Please see AVS handed out to patient at the end of our visit for further patient instructions/ counseling done pertaining to today's office visit.    Note: This document was prepared using Dragon voice recognition software and may include unintentional dictation  errors.   --------------------------------------------------------------------------------------------------------------------------------------------------------------------------------------------------------------------------------------------    Subjective:    CC:  Chief Complaint  Patient presents with  . Abscess    left axilla area  . Depression    HPI: Samantha [name] is a 40 y.o. female who presents to [place] [facility] today for issues as discussed below.   HS- getting them badly in L axilla, started couple weeks ago. 10/10 pain! Which is why BP is up.   Patient is unable to move around and walk due to . They've been losing for at least 2-3 days.  Depressed, a lot work and family stress, And feeling very sad and even thought the suicide within the past couple of weeks.  She has no plans on how she would kill herself and says he would not go to do that to her family members. But she has had these thoughts. She denies any history of depression or being on any antidepressant medicines. She has never been to a counselor in the past or psychiatrist. These get these feelings worse when she has her hidradenitis suppurativa flares and is in tremendous pain.     Wt Readings from Last 3 Encounters:  06/26/16 139 lb 11.2 oz (63.4 kg)  01/31/16 148 lb (67.1 kg)  01/30/16 146 lb (66.2 kg)   BP Readings from Last 3 Encounters:  06/26/16 (!) 151/85  01/31/16 112/78  01/30/16 90/68   Pulse Readings from Last 3 Encounters:  06/26/16 90  01/31/16 87  01/30/16 88   BMI Readings from Last 3 Encounters:  06/26/16 23.98 kg/m  01/31/16 25.40 kg/m  01/30/16 25.06 kg/m     Patient Care Team    Relationship Specialty Notifications Start End  Mellody Dance, DO PCP - General Family Medicine  01/31/16      Patient Active Problem List   Diagnosis Date Noted  . Adjustment disorder with mixed anxiety and depressed mood 06/26/2016  . Depressed mood 06/26/2016   . Back pain 01/31/2016  . 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 Acute pericarditis 12/26/2015  . Hidradenitis suppurativa 11/07/2015  . 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 Sinusitis 10/20/2015  . 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 Marijuana use 10/20/2015  . Smoker 05/04/2015  . TIA (transient ischemic attack) 05/04/2015  . Palpitations 05/04/2015  . Shortness of breath 05/04/2015  . Decreased appetite 05/04/2015  . Rash and nonspecific skin eruption 05/04/2015  . 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 Hyperlipidemia 04/22/2015  . TOA (tubo-ovarian abscess) 11/23/2012  . History of abnormal Pap smear 12/20/2011  . History of PID 12/20/2011    Past Medical history, Surgical history, Family history, Social history, Allergies and Medications have been entered into the medical record, reviewed and changed as needed.   Allergies:  No Known Allergies  Review of Systems  Constitutional: Positive for chills, diaphoresis, fever and malaise/fatigue. Negative for weight loss.  HENT: Positive for ear pain. Negative for congestion, sore throat and tinnitus.   Eyes: Negative.  Negative for blurred vision, double vision and photophobia.  Respiratory:  Negative.  Negative for cough and wheezing.   Cardiovascular: Positive for palpitations. Negative for chest pain.  Gastrointestinal: Positive for diarrhea and nausea. Negative for blood in stool.  Genitourinary: Negative.  Negative for dysuria, frequency and urgency.  Musculoskeletal: Positive for back pain. Negative for joint pain and myalgias.       Chronic problem   Skin: Negative.  Negative for itching and rash.  Neurological: Positive for weakness. Negative for dizziness, focal weakness and headaches.  Endo/Heme/Allergies: Negative.  Negative for environmental allergies and polydipsia. Does not bruise/bleed easily.  Psychiatric/Behavioral: Positive for depression. Negative for memory loss. The patient is nervous/anxious and has insomnia.       Objective:   Blood pressure (!) 151/85, pulse 90, temperature 99 F (37.2 C), temperature source Oral, height 5\' 4"  (1.626 m), weight 139 lb 11.2 oz (63.4 kg). Body mass index is 23.98 kg/m. General: Well Developed, well nourished, appropriate for stated age.  Neuro: Alert and oriented x3, extra-ocular muscles intact, sensation grossly intact.  HEENT: Normocephalic, atraumatic, neck supple   Skin: Warm and dry,  L axilla with multiple indurated hard nodules and one open suppurative lesion approx 3cm * 5cm  Respiratory: Not using accessory muscles, speaking in full sentences-unlabored. Vascular:  No gross lower ext edema, cap RF less 2 sec. Psych: No HI, judgement and insight good, depressed mood. Flat Affect and tearful at times.

## 2016-06-26 NOTE — Patient Instructions (Addendum)
Handout on's hidradenitis suppurativa from up-to-date given to patient.   care of wounds. He is oral antibiotic tablets of doxycycline, clindamycin gel daily. Use over-the-counter Hibiclens twice daily. Await culture results.  We'll send to dermatology for future treatment and possible intralesional injections etc.  We will start Paxil for depression. Please go to a counselor to speak with them at least once weekly for your depression. I will see back in 3 weeks to see how you're doing on this medicine which should help for it with sadness feelings   Suicidal Feelings: How to Help Yourself Suicide is the taking of one's own life. If you feel as though life is getting too tough to handle and are thinking about suicide, get help right away. To get help:  Call your local emergency services (911 in the [place]  Call a suicide hotline to speak with a trained counselor who understands how you are feeling. The following is a list of suicide hotlines in the [place]. For a list of hotlines in [place], visit FindSkins.pl.  1-800-273-TALK 669-798-1615).  1-800-SUICIDE 304-616-5350).  587 775 4542. This is a hotline for Spanish speakers.  E7576207 423-623-4367). This is a hotline for TTY users.  1-866-4-U-TREVOR 435-875-9386). This is a hotline for lesbian, gay, bisexual, transgender, or questioning youth.  Contact a crisis center or a local suicide prevention center. To find a crisis center or suicide prevention center:  Call your local hospital, clinic, community service organization, mental health center, social service provider, or health department. Ask for assistance in connecting to a crisis center.  Visit BankingRep.com.au for a list of crisis centers in the [place], or visit www.suicideprevention.ca/thinking-about-suicide/find-a-crisis-centre for a list of centers in  [place].  Visit the following websites:  National Suicide Prevention Lifeline: www.suicidepreventionlifeline.org  Hopeline: www.hopeline.[place] for Suicide Prevention: PromotionalLoans.co.za  The [employer] (for lesbian, gay, bisexual, transgender, or questioning youth): www.thetrevorproject.org HOW CAN I HELP MYSELF FEEL BETTER?  Promise yourself that you will not do anything drastic when you have suicidal feelings. Remember, there is hope. Many people have gotten through suicidal thoughts and feelings, and you will, too. You may have gotten through them before, and this proves that you can get through them again.  Let family, friends, teachers, or counselors know how you are feeling. Try not to isolate yourself from those who care about you. Remember, they will want to help you. Talk with someone every day, even if you do not feel sociable. Face-to-face conversation is best.  Call a mental health professional and see one regularly.  Visit your primary health care provider every year.  Eat a well-balanced diet, and space your meals so you eat regularly.  Get plenty of rest.  Avoid alcohol and drugs, and remove them from your home. They will only make you feel worse.  If you are thinking of taking a lot of medicine, give your medicine to someone who can give it to you one day at a time. If you are on antidepressants and are concerned you will overdose, let your health care provider know so he or she can give you safer medicines. Ask your mental health professional about the possible side effects of any medicines you are taking.  Remove weapons, poisons, knives, and anything else that could harm you from your home.  Try to stick to routines. Follow a schedule every day. Put self-care on your schedule.  Make a list of realistic goals, and cross them off when you achieve them. Accomplishments give a sense  of worth.  Wait until you are feeling better before doing the  things you find difficult or unpleasant.  Exercise if you are able. You will feel better if you exercise for even a half hour each day.  Go out in the sun or into nature. This will help you recover from depression faster. If you have a favorite place to walk, go there.  Do the things that have always given you pleasure. Play your favorite music, read a good book, paint a picture, play your favorite instrument, or do anything else that takes your mind off your depression if it is safe to do.  Keep your living space well lit.  When you are feeling well, write yourself a letter about tips and support that you can read when you are not feeling well.  Remember that life's difficulties can be sorted out with help. Conditions can be treated. You can work on thoughts and strategies that serve you well.   This information is not intended to replace advice given to you by your health care provider. Make sure you discuss any questions you have with your health care provider.   Document Released: 03/03/2003 Document Revised: 09/17/2014 Document Reviewed: 12/22/2013 Elsevier Interactive Patient Education [employer].

## 2016-06-27 ENCOUNTER — Telehealth: Payer: Self-pay | Admitting: Family Medicine

## 2016-06-27 ENCOUNTER — Other Ambulatory Visit: Payer: Self-pay

## 2016-06-27 MED ORDER — PROMETHAZINE HCL 25 MG PO TABS: ORAL_TABLET | ORAL | 0 refills | Status: DC

## 2016-06-27 MED ORDER — PROMETHAZINE HCL 25 MG PO TABS: 25.0000 mg | ORAL_TABLET | Freq: Three times a day (TID) | ORAL | 0 refills | Status: DC | PRN

## 2016-06-27 MED ORDER — PAROXETINE HCL 10 MG PO TABS: 10.0000 mg | ORAL_TABLET | Freq: Two times a day (BID) | ORAL | 0 refills | Status: DC

## 2016-06-27 NOTE — Telephone Encounter (Signed)
Per Dr. Raliegh Scarlet, RX for promethazine for nausea sent to pharmacy.  Paxil CR changed to Paxil 10mg  BID.  Charyl Bigger, CMA

## 2016-06-27 NOTE — Telephone Encounter (Signed)
Patient called saying that the nausea is worse today than yesterday when we saw her and the med Dr. Jenetta Downer prescribed for her depression is going to be $300 and she can't afford that and was wondering if there is another alternative.

## 2016-06-27 NOTE — Addendum Note (Signed)
Addended by: Fonnie Mu on: 06/27/2016 11:24 AM   Modules accepted: Orders

## 2016-07-26 ENCOUNTER — Ambulatory Visit: Payer: PRIVATE HEALTH INSURANCE | Admitting: Family Medicine

## 2016-08-01 ENCOUNTER — Ambulatory Visit: Payer: PRIVATE HEALTH INSURANCE | Admitting: Adult Health

## 2016-08-08 ENCOUNTER — Ambulatory Visit (INDEPENDENT_AMBULATORY_CARE_PROVIDER_SITE_OTHER): Payer: PRIVATE HEALTH INSURANCE | Admitting: Adult Health

## 2016-08-08 ENCOUNTER — Encounter: Payer: Self-pay | Admitting: Adult Health

## 2016-08-08 VITALS — BP 118/75 | HR 75 | Wt 138.2 lb

## 2016-08-08 DIAGNOSIS — R519 Headache, unspecified: Secondary | ICD-10-CM

## 2016-08-08 DIAGNOSIS — R51 Headache: Secondary | ICD-10-CM

## 2016-08-08 DIAGNOSIS — R400 Somnolence: Secondary | ICD-10-CM

## 2016-08-08 DIAGNOSIS — R0683 Snoring: Secondary | ICD-10-CM

## 2016-08-08 DIAGNOSIS — R0681 Apnea, not elsewhere classified: Secondary | ICD-10-CM | POA: Diagnosis not present

## 2016-08-08 NOTE — Patient Instructions (Signed)
Referral for sleep study  If your symptoms worsen or you develop new symptoms please let us know.

## 2016-08-08 NOTE — Progress Notes (Signed)
PATIENT: Samantha [name] DOB: 07/20/1976  REASON FOR VISIT: follow up- migraine headaches HISTORY FROM: patient  HISTORY OF PRESENT ILLNESS: Samantha [name] is a 40 year old female with a history of migraine headaches. She returns today for follow-up. She states that she is no longer on verapamil. She reports that she stopped this medication. She reports that she is no longer having headaches. Her main concern today is her sleep. She states that she had a friend stay with her and they reported that she snored very loudly and there were times she even stopped breathing. She states that she is excessively sleepy during the day. She wakes up often during the night. She also feels that she "dreams a lot." She reports that she would like to have her sleep evaluated if possible. She returns today for an evaluation.  HISTORY 01/30/16:  Samantha [name] is a 40 year old female with a history of migraine headaches. She returns today for follow-up. She was started on verapamil. She reports that this is been working well for her headaches. She states that she rarely has a headache now. She denies any strokelike symptoms. She is taking aspirin daily. She states that she woke up this morning not feeling well. She states that she was told she had "reversed acne" in her axilla. She reports that she was given cream in the emergency room however she cannot afford this. She does not have a primary care provider. She states that she gets "tender knots" in her axilla. She states that if she squeezes them it releases pus. She states that she also has "blackheads" in the axilla. She reports that when she gets these hard knots that's when she starts to feel bad. She returns today for an evaluation.  HISTORY 11/01/15: Samantha [name] is a 40 year old right-handed black female with a history of migraine headache that has been relatively frequent over the last 3 years. The patient has had headache episodes that generally occur around the left  frontal area. The patient indicates that the headache may be associated with white spots in the vision, she may get dizzy, and have cognitive clouding during the headache. The headache may last all day long. She indicates that she is getting headaches about every other day, and the headaches may be associated with lack of good sleep. The patient will get to sleep well, but she wakes up with frequent dreaming, and cannot get back to sleep. The patient has had 2 episodes of strokelike symptoms with her headaches, one in August 2016, and one recently in February 2017. The episodes are associated with numbness of the right face and right arm, some weakness or clumsiness on that side, speech issues, and double vision. The patient feels confused during the episodes. The patient has had very thorough workups that have included MRI of the brain, MRA of the head, 2-D echocardiogram, carotid Doppler studies, 30 day cardiac monitor study, and a hypercoagulable state workup on blood work. All of these evaluations were negative. The patient smokes cigarettes, and she is on birth control, primarily progesterone containing. The patient does smoke marijuana on occasion. She has a history of asthma. The patient was placed on verapamil, but she has not yet started this medication. She was told to go on low-dose aspirin, she has not done this yet. She continues to smoke. The patient will take Advil for the headache with some benefit. She denies any difficulty with balance or difficulty controlling the bowels or the bladder. She denies any blackout episodes. She  does not have a primary care physician. She comes to this office for an evaluation.  REVIEW OF SYSTEMS: Out of a complete 14 system review of symptoms, the patient complains only of the following symptoms, and all other reviewed systems are negative.  Activity change, appetite change, double vision, shortness of breath, choking, facial swelling, insomnia, snoring, neck  pain, aching muscles, back pain, joint pain, joint swelling, weakness, headache, dizziness, memory loss, behavior problem, confusion, depression, nervous/anxious  ALLERGIES: No Known Allergies  HOME MEDICATIONS: Outpatient Medications Prior to Visit  Medication Sig Dispense Refill  . clindamycin-benzoyl peroxide (BENZACLIN) gel Apply topically 2 (two) times daily. 30 g 3  . doxycycline (VIBRA-TABS) 100 MG tablet Take 1 tablet (100 mg total) by mouth 2 (two) times daily. 20 tablet 0  . PARoxetine (PAXIL) 10 MG tablet Take 1 tablet (10 mg total) by mouth 2 (two) times daily. 60 tablet 0  . promethazine (PHENERGAN) 25 MG tablet 1/2 to 1 tablet every 4-6 hours as needed for nausea. 36 tablet 0   No facility-administered medications prior to visit.     PAST MEDICAL HISTORY: Past Medical History:  Diagnosis Date  . Back pain   . 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 Hyperlipidemia   . Medical history non-contributory   . TIA (transient ischemic attack) 04/2015   questionable, most likely complicated migraine    PAST SURGICAL HISTORY: Past Surgical History:  Procedure Laterality Date  . LEEP      FAMILY HISTORY: Family History  Problem Relation Age of Onset  . Hypertension Mother   . Lupus Mother   . Diabetes Father   . Thyroid disease Sister   . Diabetes Sister   . Cancer Sister     stomach  . Diabetes Maternal Grandmother   . Anesthesia problems Neg Hx   . Other Neg Hx   . Migraines Neg Hx     SOCIAL HISTORY: Social History   Social History  . Marital status: Single    Spouse name: N/A  . Number of children: 1  . Years of education: 9   Occupational History  . Not on file.   Social History Main Topics  . Smoking status: Current Every Day Smoker    Packs/day: 0.25    Years: 17.00  . Smokeless tobacco: Never Used     Comment: 3 cigarettes a day   . Alcohol use No  . Drug use:     Types: Marijuana  . Sexual activity: Not on file   Other Topics Concern  . Not on  file   Social History Narrative   Patient drinks sodas all day long.   Patient is right handed.       PHYSICAL EXAM  Vitals:   08/08/16 1438  BP: 118/75  Pulse: 75  Weight: 138 lb 3.2 oz (62.7 kg)   Body mass index is 23.72 kg/m.  Generalized: Well developed, in no acute distress  Neck: Circumference 13 inches, Mallampati 3+  Neurological examination  Mentation: Alert oriented to time, place, history taking. Follows all commands speech and language fluent Cranial nerve II-XII: Pupils were equal round reactive to light. Extraocular movements were full, visual field were full on confrontational test. Facial sensation and strength were normal. Uvula tongue midline. Head turning and shoulder shrug  were normal and symmetric. Motor: The motor testing reveals 5 over 5 strength of all 4 extremities. Good symmetric motor tone is noted throughout.  Sensory: Sensory testing is intact to soft touch on all 4  extremities. No evidence of extinction is noted.  Coordination: Cerebellar testing reveals good finger-nose-finger and heel-to-shin bilaterally.  Gait and station: Gait is normal. Tandem gait is normal. Romberg is negative. No drift is seen.  Reflexes: Deep tendon reflexes are symmetric and normal bilaterally.   DIAGNOSTIC DATA (LABS, IMAGING, TESTING) - I reviewed patient records, labs, notes, testing and imaging myself where available.  Lab Results  Component Value Date   WBC 6.5 12/07/2015   HGB 13.7 12/07/2015   HCT 40.5 12/07/2015   MCV 92.3 12/07/2015   PLT 220 12/07/2015      Component Value Date/Time   NA 139 12/07/2015 1053   K 3.9 12/07/2015 1053   CL 107 12/07/2015 1053   CO2 25 12/07/2015 1053   GLUCOSE 100 (H) 12/07/2015 1053   BUN 9 12/07/2015 1053   CREATININE 0.71 12/07/2015 1053   CALCIUM 9.4 12/07/2015 1053   PROT 7.0 12/07/2015 1053   ALBUMIN 3.8 12/07/2015 1053   AST 18 12/07/2015 1053   ALT 8 (L) 12/07/2015 1053   ALKPHOS 60 12/07/2015 1053    BILITOT 0.4 12/07/2015 1053   GFRNONAA >60 12/07/2015 1053   GFRAA >60 12/07/2015 1053   Lab Results  Component Value Date   CHOL 187 10/20/2015   HDL 55 10/20/2015   LDLCALC 121 (H) 10/20/2015   TRIG 53 10/20/2015   CHOLHDL 3.4 10/20/2015   Lab Results  Component Value Date   HGBA1C 5.9 (H) 10/20/2015   No results found for: [id number] Lab Results  Component Value Date   TSH 2.140 10/20/2015      ASSESSMENT AND PLAN 40 y.o. year old female  has a past medical history of Back pain; Basilar migraine (11/01/2015); Hyperlipidemia; Medical history non-contributory; and TIA (transient ischemic attack) (04/2015). here with:  1. Headache 2. Snoring 3. Daytime sleepiness 3. Witnessed apnea events  The patient's headaches seem to have resolved. She is now having trouble with her sleep. Reporting loud snoring, daytime sleepiness and witnessed apnea events. Patient has normal BMI and neck size. Mallampati+.The patient does note that she has trouble with depression but does not feel that her daytime sleepiness is solely related to this. I will refer the patient for a sleep evaluation. Since her headaches have resolved she will follow-up on an as-needed basis.     Ward Givens, MSN, NP-C 08/08/2016, 2:39 PM [facility] [address] [place], [place] [address] [phone]

## 2016-08-08 NOTE — Progress Notes (Signed)
I have read the note, and I agree with the clinical assessment and plan.  Candia Kingsbury KEITH   
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
## 2016-09-19 ENCOUNTER — Encounter: Payer: Self-pay | Admitting: Family Medicine

## 2016-09-19 ENCOUNTER — Ambulatory Visit (INDEPENDENT_AMBULATORY_CARE_PROVIDER_SITE_OTHER): Payer: PRIVATE HEALTH INSURANCE | Admitting: Family Medicine

## 2016-09-19 VITALS — BP 98/62 | HR 81 | Temp 98.7°F | Resp 18 | Wt 139.0 lb

## 2016-09-19 DIAGNOSIS — M791 Myalgia, unspecified site: Secondary | ICD-10-CM

## 2016-09-19 DIAGNOSIS — R062 Wheezing: Secondary | ICD-10-CM | POA: Diagnosis not present

## 2016-09-19 DIAGNOSIS — Z716 Tobacco abuse counseling: Secondary | ICD-10-CM

## 2016-09-19 DIAGNOSIS — R05 Cough: Secondary | ICD-10-CM

## 2016-09-19 DIAGNOSIS — R509 Fever, unspecified: Secondary | ICD-10-CM

## 2016-09-19 DIAGNOSIS — F17200 Nicotine dependence, unspecified, uncomplicated: Secondary | ICD-10-CM

## 2016-09-19 DIAGNOSIS — R059 Cough, unspecified: Secondary | ICD-10-CM

## 2016-09-19 LAB — POCT INFLUENZA A/B
INFLUENZA A, POC: NEGATIVE
Influenza B, POC: NEGATIVE

## 2016-09-19 MED ORDER — IPRATROPIUM-ALBUTEROL 0.5-2.5 (3) MG/3ML IN SOLN
3.0000 mL | Freq: Once | RESPIRATORY_TRACT | Status: AC
  Administered 2016-09-19: 3 mL via RESPIRATORY_TRACT

## 2016-09-19 MED ORDER — METHYLPREDNISOLONE SODIUM SUCC 125 MG IJ SOLR
125.0000 mg | Freq: Once | INTRAMUSCULAR | Status: AC
  Administered 2016-09-19: 125 mg via INTRAMUSCULAR

## 2016-09-19 MED ORDER — HYDROCOD POLST-CPM POLST ER 10-8 MG/5ML PO SUER
5.0000 mL | Freq: Two times a day (BID) | ORAL | 0 refills | Status: DC | PRN
Start: 2016-09-19 — End: 2017-09-26

## 2016-09-19 MED ORDER — ALBUTEROL SULFATE HFA 108 (90 BASE) MCG/ACT IN AERS
1.0000 | INHALATION_SPRAY | RESPIRATORY_TRACT | 3 refills | Status: DC | PRN
Start: 2016-09-19 — End: 2017-11-12

## 2016-09-19 MED ORDER — DOXYCYCLINE HYCLATE 100 MG PO TABS: 100.0000 mg | ORAL_TABLET | Freq: Two times a day (BID) | ORAL | 0 refills | Status: DC

## 2016-09-19 MED ORDER — PREDNISONE 20 MG PO TABS: ORAL_TABLET | ORAL | 0 refills | Status: DC

## 2016-09-19 NOTE — Progress Notes (Signed)
Acute Care Office visit  Assessment and plan:  1. Fever, unspecified fever cause   2. Myalgia   3. Cough   4. Wheezing   5. Tobacco use disorder   6. Tobacco abuse counseling    - Flu test - DuoNeb right now. - Steroid injection 125 mg IM 1 Solu-Medrol. - She will start prednisone tomorrow orally  - Use cough meds/ Tussionex only when necessary - use albuterol inhaler prn wh/ SOB - She was supposed to start doxycycline per her dermatologist for her hidradenitis - but she did not so we will call in this prescription which will hopefully cover her sinusitis and bronchitis.  She will call us if she is not feeling better in 2-3 weeks.  Anticipatory guidance and routine counseling done re: condition, txmnt options and need for follow up. All questions of patient's were answered.   Orders Placed This Encounter  Procedures  . POCT Influenza A/B     New Prescriptions   ALBUTEROL (PROVENTIL HFA;VENTOLIN HFA) 108 (90 BASE) MCG/ACT INHALER    Inhale 1-2 puffs into the lungs every 4 (four) hours as needed for wheezing or shortness of breath.   CHLORPHENIRAMINE-HYDROCODONE (TUSSIONEX) 10-8 MG/5ML SUER    Take 5 mLs by mouth every 12 (twelve) hours as needed for cough (cough, will cause drowsiness.).   PREDNISONE (DELTASONE) 20 MG TABLET    Take 3 pills a day for 2 days, 2 pills a day for 2 days, 1 pill a day for 2 days then one half pill a day for 2 days then off    Modified Medications   Modified Medication Previous Medication   DOXYCYCLINE (VIBRA-TABS) 100 MG TABLET doxycycline (VIBRA-TABS) 100 MG tablet      Take 1 tablet (100 mg total) by mouth 2 (two) times daily.    Take 1 tablet (100 mg total) by mouth 2 (two) times daily.    Discontinued Medications   No medications on file     Gross side effects, risk and benefits, and alternatives of medications discussed with patient.  Patient is aware that all medications have potential side effects and we are unable to predict every  sideeffect or drug-drug interaction that may occur.  Expresses verbal understanding and consents to current therapy plan and treatment regiment.  Return if symptoms worsen or fail to improve.  Please see AVS handed out to patient at the end of our visit for additional patient instructions/ counseling done pertaining to today's office visit.  Note: This document was prepared using Dragon voice recognition software and may include unintentional dictation errors.    Subjective:    Chief Complaint  Patient presents with  . Cough  . Nasal Congestion    HPI:     - Sx started 9 days ago. With just Rn, St, cough - occ- dry.  Then 3 days ago--> achy muscles-bad, sob, wh, and coughing more.  Retrosternal CP with coughing only- not worse with deep breaths.   Fevers- subjective.   Also had some loose stools.  1-2 times per day.   Hasn't gone to work past 3 days.    Tried Nyquil and OJ-  for sx.    Overall getting worse.  Works Agricultural consultant- workers sick as well.   Still smoking  Patient Active Problem List   Diagnosis Date Noted  . Smoker 05/04/2015    Priority: High  . Hyperlipidemia 04/22/2015    Priority: High  . Marijuana use 10/20/2015    Priority: Medium  .  Adjustment disorder with mixed anxiety and depressed mood 06/26/2016  . Depressed mood 06/26/2016  . Back pain 01/31/2016  . 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 Acute pericarditis 12/26/2015  . Hidradenitis suppurativa 11/07/2015  . 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 Sinusitis 10/20/2015  . 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 TIA (transient ischemic attack) 05/04/2015  . Palpitations 05/04/2015  . Shortness of breath 05/04/2015  . Decreased appetite 05/04/2015  . Rash and nonspecific skin eruption 05/04/2015  . 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[place]) 04/22/2015  . TOA (tubo-ovarian abscess) 11/23/2012  . History of abnormal Pap smear 12/20/2011  . History of PID 12/20/2011    Past medical history, Surgical history, Family history  reviewed and noted below, Social history, Allergies, and Medications have been entered into the medical record, reviewed and changed as needed.   No Known Allergies  Review of Systems  Constitutional: Positive for chills and fever. Negative for diaphoresis, malaise/fatigue and weight loss.  HENT: Positive for congestion and sore throat. Negative for ear pain, sinus pain and tinnitus.   Eyes: Negative for blurred vision, double vision, photophobia and pain.  Respiratory: Positive for cough, shortness of breath and wheezing. Negative for sputum production.   Cardiovascular: Positive for chest pain. Negative for palpitations and orthopnea.       With coughing.  Gastrointestinal: Positive for diarrhea. Negative for blood in stool, nausea and vomiting.  Genitourinary: Negative for dysuria, frequency and urgency.  Musculoskeletal: Positive for myalgias. Negative for joint pain.  Skin: Negative for itching and rash.  Neurological: Positive for weakness and headaches. Negative for dizziness and focal weakness.  Endo/Heme/Allergies: Negative for environmental allergies and polydipsia. Does not bruise/bleed easily.    Objective:   Blood pressure 98/62, pulse 81, temperature 98.7 F (37.1 C), temperature source Oral, resp. rate 18, weight 139 lb (63 kg), SpO2 100 %. Body mass index is 23.86 kg/m. General: Well Developed, well nourished, appropriate for stated age.  Neuro: Alert and oriented x3, extra-ocular muscles intact, sensation grossly intact.  HEENT: Normocephalic, atraumatic, pupils equal round reactive to light, neck supple, no masses, no painful lymphadenopathy, TM's intact B/L, no acute findings. Nares- patent, clear d/c, OP- clear, mild erythema, No TTP sinuses Skin: Warm and dry, no gross rash. Cardiac: RRR, S1 S2,  no murmurs rubs or gallops.  Respiratory: Scattered mild expiratory wheezes throughout. Decent aeration otherwise. Coarse breath sounds cleared with cough, Not using  accessory muscles, speaking in full sentences- unlabored. Vascular:  No gross lower ext edema, cap RF less 2 sec. Psych: No HI/SI, judgement and insight good, Euthymic mood. Full Affect.   Patient Care Team    Relationship Specialty Notifications Start End  Mellody Dance, DO PCP - General Family Medicine  01/31/16

## 2016-09-19 NOTE — Patient Instructions (Signed)
Told pt to think seriously about quitting smoking!  Told pt it is very important for his/her health and well being.    Smoking cessation instruction/counseling given:  counseled patient on the dangers of tobacco use, advised patient to stop smoking, and reviewed strategies to maximize success  Discussed with patient that there are multiple treatments to aid in quitting smoking, however I explained none will work unless pt really want to quit  Told to call 1-800-QUIT-NOW 337-202-2493) for free smoking cessation counseling and support, or pt can go online to www.heart.org - the American [employer] Association website and search "quit smoking ".   - Consider using taping-with a shop and they will explain the process. He was slowly weaned down on nicotine over time. Also can use over-the-counter nicotine patches and slowly decrease the amount given over time as well. Please let me know if he would like any other direction or the sent to a smoking cessation counselor.

## 2017-02-19 IMAGING — MR MR HEAD W/O CM
9 of 11 series · 31 of 48 positions shown · non-contrast
Comparison: Head CT without contrast 10/19/2015. Brain MRI and
intracranial MRA 04/21/2015

CLINICAL DATA: 39-year-old female with headache, right hand
numbness progressing to right shoulder and face numbness, and
diplopia at 1513 hours yesterday. Symptoms other than headache
resolved. Initial encounter.

EXAM:
MRI HEAD WITHOUT CONTRAST
MRA HEAD WITHOUT CONTRAST
TECHNIQUE: Multiplanar, multiecho pulse sequences of the brain and surrounding
structures were obtained without intravenous contrast. Angiographic
images of the head were obtained using MRA technique without
contrast.

[Series 3: T1 · sagittal · 5.0mm · 0.47mm/px · 2 of 23 slices shown]
[im 1/23]
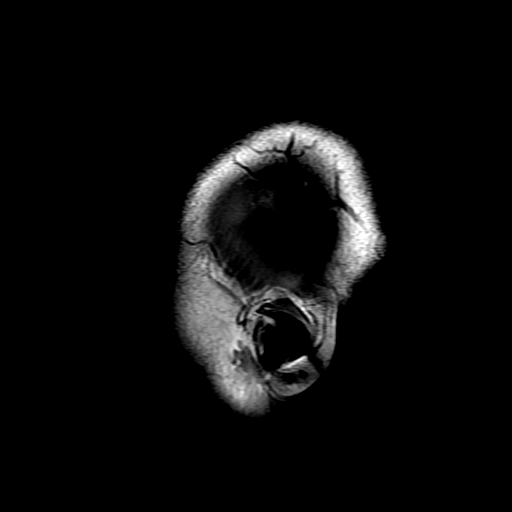
[im 23/23]
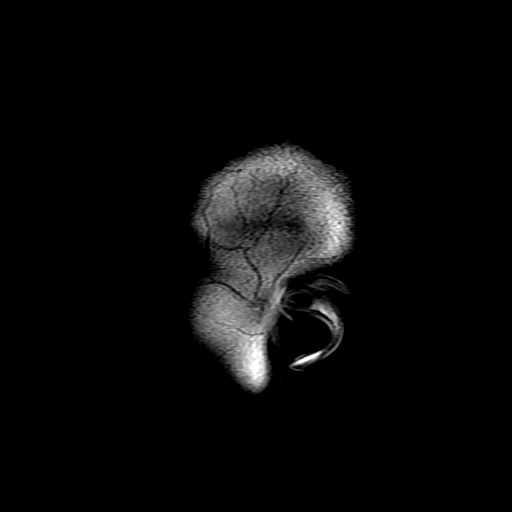

[Series 4: DWI · axial · 3.0mm · 1.09mm/px · z∈[-51,+79]mm · 7 of 90 slices shown (1 of 4)]
[im 1/90]
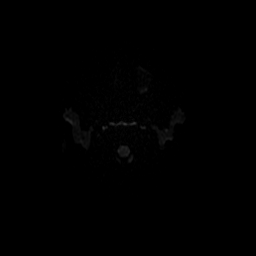
[im 15/90]
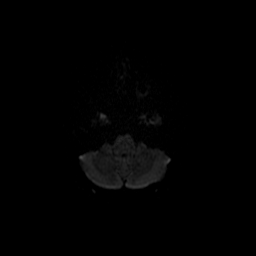
[im 30/90]
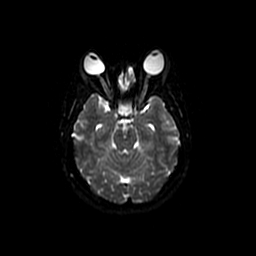
[im 45/90]
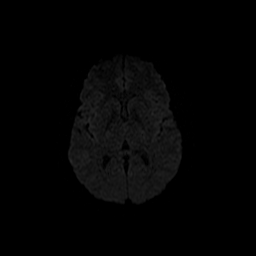
[im 60/90]
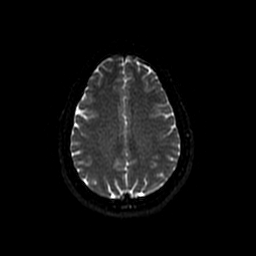
[im 75/90]
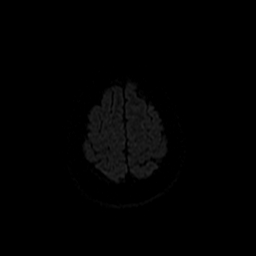
[im 90/90]
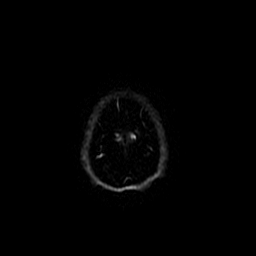

[Series 5: (id) mt fs · axial · 1.4mm · 0.43mm/px · z∈[-47,-10]mm · 4 of 136 slices shown]
[im 1/136]
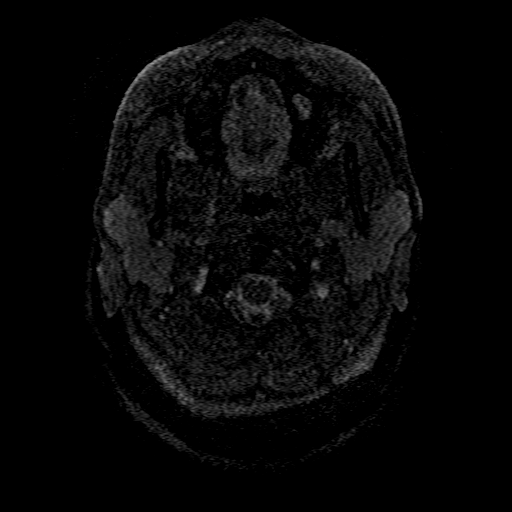
[im 28/136]
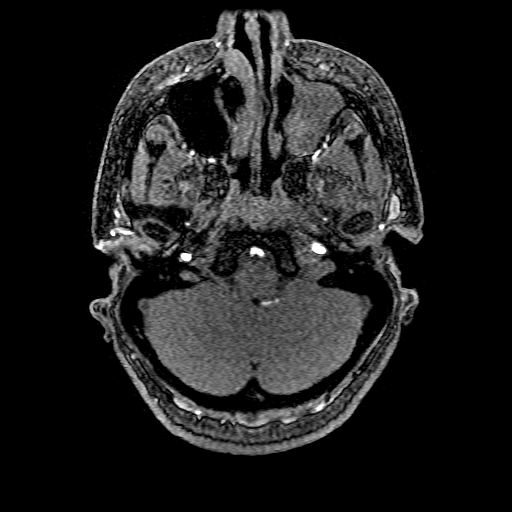
[im 41/136]
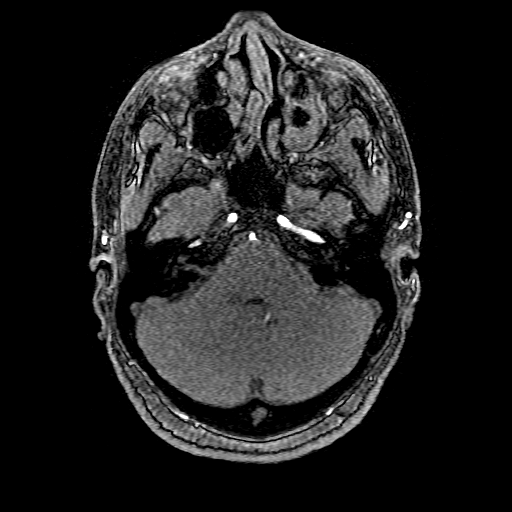
[im 55/136]
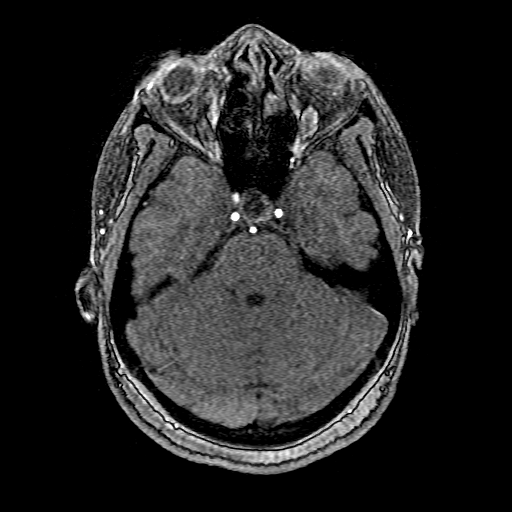

[Series 6: T2 · axial · 5.0mm · 0.43mm/px · z∈[-48,+86]mm · 2 of 24 slices shown (1 of 2)]
[im 1/24]
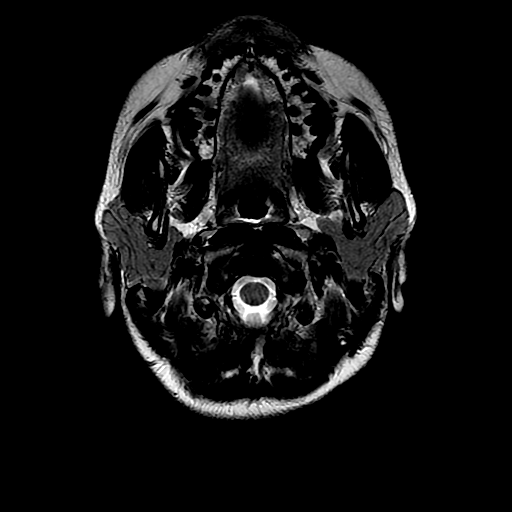
[im 24/24]
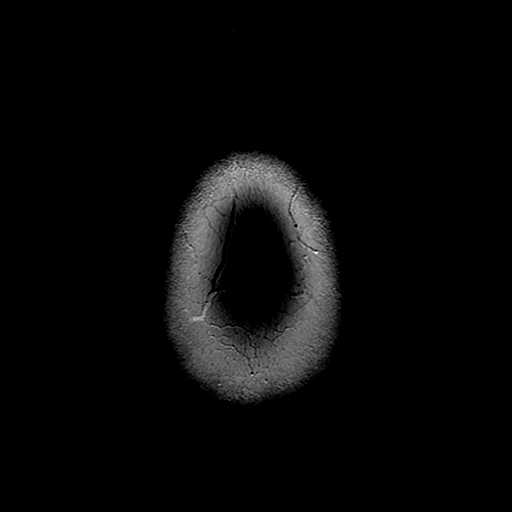

[Series 7: DWI · coronal · 5.0mm · 1.09mm/px · 5 of 64 slices shown (2 of 4)]
[im 1/64]
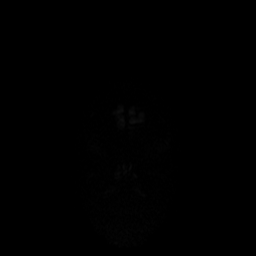
[im 16/64]
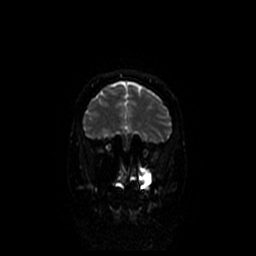
[im 32/64]
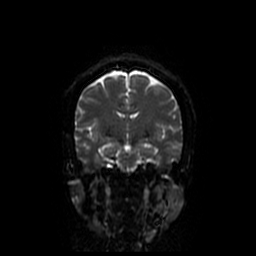
[im 48/64]
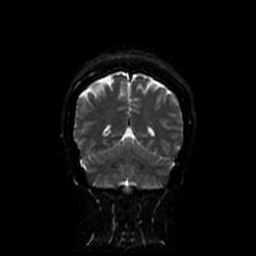
[im 64/64]
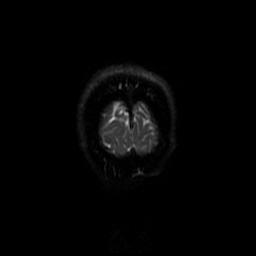

[Series 8: FLAIR · axial · 5.0mm · 0.43mm/px · z∈[-48,+86]mm · 2 of 24 slices shown]
[im 1/24]
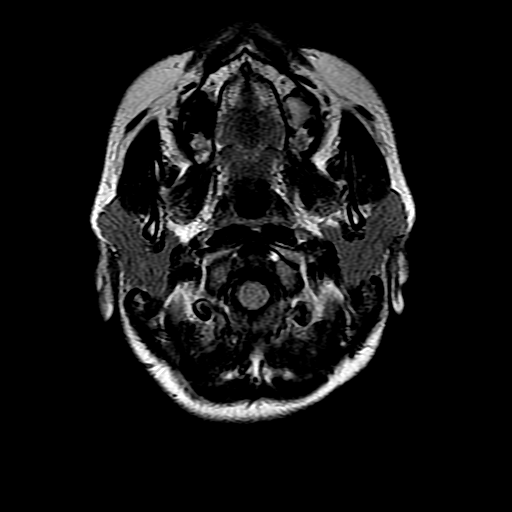
[im 24/24]
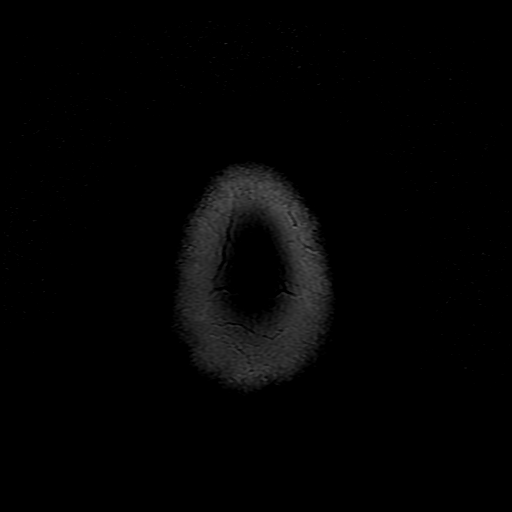

[Series 11: T2 · coronal · 5.0mm · 0.39mm/px · 2 of 25 slices shown (2 of 2)]
[im 1/25]
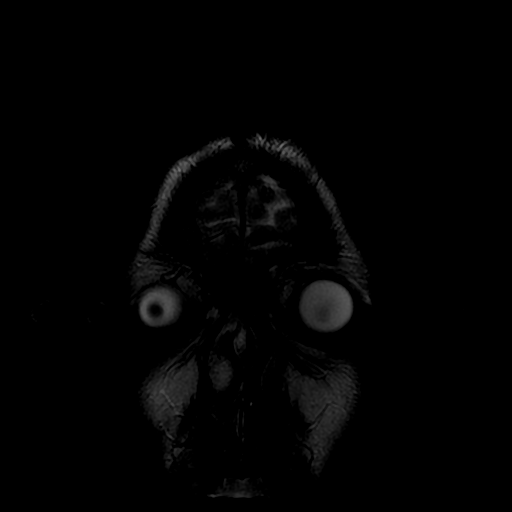
[im 25/25]
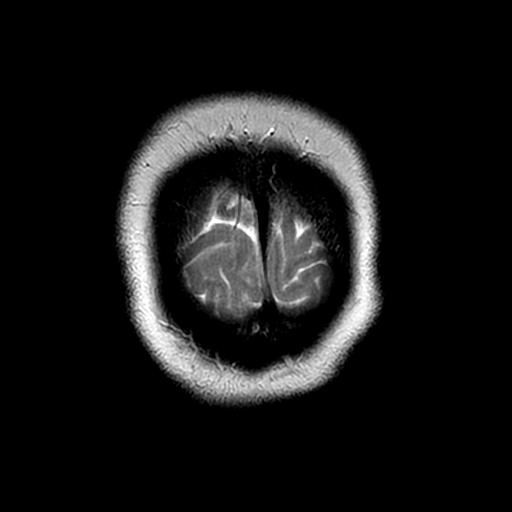

[Series 400: DWI · axial · 3.0mm · 1.09mm/px · z∈[-51,+79]mm · 4 of 45 slices shown (3 of 4)]
[im 1/45]
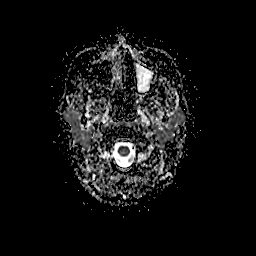
[im 15/45]
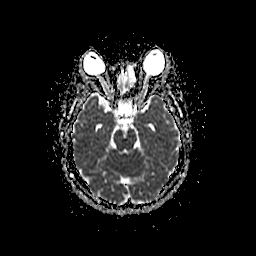
[im 30/45]
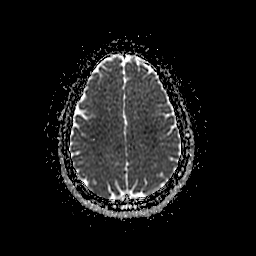
[im 45/45]
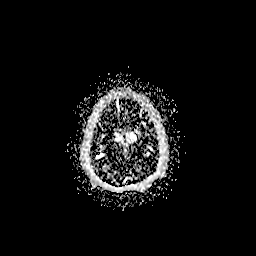

[Series 700: DWI · coronal · 5.0mm · 1.09mm/px · 3 of 32 slices shown (4 of 4)]
[im 1/32]
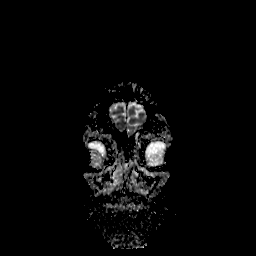
[im 16/32]
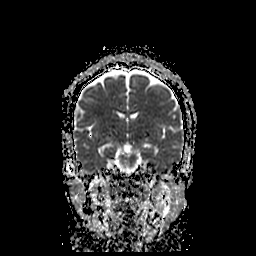
[im 32/32]
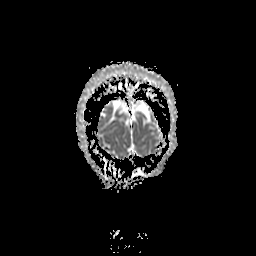

[31 of 48 positions shown; findings below may reference images not displayed]

FINDINGS: MRI HEAD FINDINGS

Major intracranial vascular flow voids are stable. Cerebral volume
remains normal. Partially empty sella re- demonstrated and
unchanged. No restricted diffusion to suggest acute infarction. No
midline shift, mass effect, evidence of mass lesion,
ventriculomegaly, extra-axial collection or acute intracranial
hemorrhage. Cervicomedullary junction within normal limits. Negative
visualized cervical spine.

Gray and white matter signal is within normal limits for age
throughout the brain. No chronic cerebral blood products.

Visible internal auditory structures appear normal. Mastoids are
clear.

Chronic but increased left maxillary sinus inflammatory changes with
combined fluid and mucous retention cysts. Chronic ethmoid sinus and
frontoethmoidal recess opacification appears stable. Other paranasal
sinuses are clear. Negative orbit and scalp soft tissues. Stable
bone marrow signal.

MRA HEAD FINDINGS

Stable antegrade flow in the posterior circulation with codominant
distal vertebral arteries. PICA origins remain patent. Normal
vertebrobasilar junction. No basilar stenosis. Normal SCA and PCA
origins. Left posterior communicating artery is present. Bilateral
PCA branches are within normal limits.

Stable antegrade flow in both ICA siphons. Normal ophthalmic and
left posterior communicating artery origins. Normal carotid termini,
MCA and ACA origins. Diminutive or absent anterior communicating
artery. Visualized bilateral ACA branches are within normal limits.
Visualized bilateral MCA branches are stable and within normal
limits.
IMPRESSION: 1. Stable and normal for age noncontrast MRI appearance of the
brain.
2. Stable and normal intracranial MRA.
3. Acute on chronic left paranasal sinusitis, mildly progressed
since [DATE].

## 2017-03-06 ENCOUNTER — Emergency Department (HOSPITAL_COMMUNITY)
Admission: EM | Admit: 2017-03-06 | Discharge: 2017-03-06 | Disposition: A | Discharge status: Home / Self Care | Payer: Self-pay | Attending: Emergency Medicine | Admitting: Emergency Medicine

## 2017-03-06 ENCOUNTER — Emergency Department (HOSPITAL_COMMUNITY): Payer: Self-pay

## 2017-03-06 ENCOUNTER — Encounter (HOSPITAL_COMMUNITY): Payer: Self-pay | Admitting: *Deleted

## 2017-03-06 DIAGNOSIS — R42 Dizziness and giddiness: Secondary | ICD-10-CM

## 2017-03-06 DIAGNOSIS — F17200 Nicotine dependence, unspecified, uncomplicated: Secondary | ICD-10-CM | POA: Insufficient documentation

## 2017-03-06 DIAGNOSIS — F1210 Cannabis abuse, uncomplicated: Secondary | ICD-10-CM | POA: Insufficient documentation

## 2017-03-06 DIAGNOSIS — J323 Chronic sphenoidal sinusitis: Secondary | ICD-10-CM

## 2017-03-06 DIAGNOSIS — Z8673 Personal history of transient ischemic attack (TIA), and cerebral infarction without residual deficits: Secondary | ICD-10-CM | POA: Insufficient documentation

## 2017-03-06 DIAGNOSIS — Z79899 Other long term (current) drug therapy: Secondary | ICD-10-CM | POA: Insufficient documentation

## 2017-03-06 DIAGNOSIS — F4321 Adjustment disorder with depressed mood: Secondary | ICD-10-CM | POA: Insufficient documentation

## 2017-03-06 DIAGNOSIS — J0130 Acute sphenoidal sinusitis, unspecified: Secondary | ICD-10-CM | POA: Insufficient documentation

## 2017-03-06 LAB — PREGNANCY, URINE: Preg Test, Ur: NEGATIVE

## 2017-03-06 LAB — BASIC METABOLIC PANEL
Anion gap: 6 (ref 5–15)
BUN: 7 mg/dL (ref 6–20)
CO2: 26 mmol/L (ref 22–32)
Calcium: 9.1 mg/dL (ref 8.9–10.3)
Chloride: 106 mmol/L (ref 101–111)
Creatinine, Ser: 0.77 mg/dL (ref 0.44–1.00)
GFR calc Af Amer: >60 mL/min (ref >60)
GFR calc non Af Amer: >60 mL/min (ref >60)
Glucose, Bld: 103 mg/dL — ABNORMAL HIGH (ref 65–99)
Potassium: 3.3 mmol/L — ABNORMAL LOW (ref 3.5–5.1)
Sodium: 138 mmol/L (ref 135–145)

## 2017-03-06 LAB — URINALYSIS, ROUTINE W REFLEX MICROSCOPIC
Bacteria, UA: NONE SEEN
Bilirubin Urine: NEGATIVE
Glucose, UA: NEGATIVE mg/dL
Ketones, ur: NEGATIVE mg/dL
Nitrite: NEGATIVE
Protein, ur: 30 mg/dL — AB
Specific Gravity, Urine: 1.029 (ref 1.005–1.030)
pH: 5 (ref 5.0–8.0)

## 2017-03-06 LAB — TROPONIN I: Troponin I: <0.03 ng/mL (ref <0.03)

## 2017-03-06 LAB — CBC
HCT: 43.3 % (ref 36.0–46.0)
Hemoglobin: 13.9 g/dL (ref 12.0–15.0)
MCH: 30 pg (ref 26.0–34.0)
MCHC: 32.1 g/dL (ref 30.0–36.0)
MCV: 93.5 fL (ref 78.0–100.0)
Platelets: 248 10*3/uL (ref 150–400)
RBC: 4.63 MIL/uL (ref 3.87–5.11)
RDW: 12.6 % (ref 11.5–15.5)
WBC: 8.5 10*3/uL (ref 4.0–10.5)

## 2017-03-06 LAB — RAPID URINE DRUG SCREEN, HOSP PERFORMED
AMPHETAMINES: NOT DETECTED
BARBITURATES: NOT DETECTED
Benzodiazepines: NOT DETECTED
Cocaine: NOT DETECTED
Opiates: NOT DETECTED
TETRAHYDROCANNABINOL: POSITIVE — AB

## 2017-03-06 MED ORDER — FLUTICASONE PROPIONATE 50 MCG/ACT NA SUSP: 2.0000 | Freq: Every day | NASAL | 2 refills | Status: DC

## 2017-03-06 MED ORDER — POTASSIUM CHLORIDE CRYS ER 20 MEQ PO TBCR
40.0000 meq | EXTENDED_RELEASE_TABLET | Freq: Once | ORAL | Status: AC
  Administered 2017-03-06: 40 meq via ORAL
  Filled 2017-03-06: qty 2

## 2017-03-06 MED ORDER — MECLIZINE HCL 25 MG PO TABS: 25.0000 mg | ORAL_TABLET | Freq: Three times a day (TID) | ORAL | 0 refills | Status: DC | PRN

## 2017-03-06 MED ORDER — DIAZEPAM 5 MG PO TABS
5.0000 mg | ORAL_TABLET | Freq: Once | ORAL | Status: AC
Start: 2017-03-06 — End: 2017-03-06
  Administered 2017-03-06: 5 mg via ORAL
  Filled 2017-03-06: qty 1

## 2017-03-06 MED ORDER — SODIUM CHLORIDE 0.9 % IV BOLUS (SEPSIS)
1000.0000 mL | Freq: Once | INTRAVENOUS | Status: AC
  Administered 2017-03-06: 1000 mL via INTRAVENOUS

## 2017-03-06 NOTE — ED Provider Notes (Signed)
[facility] DEPT Provider Note   CSN: [id number] Arrival date & time: 03/06/17  3559     History   Chief Complaint Chief Complaint  Patient presents with  . Dizziness  . Fall    HPI Samantha [name] is a 41 y.o. female.  HPI Samantha [name] is a 41 y.o. female with history of questionable TIA, hyperlipidemia, migraine headaches, depression, presents to emergency department complaining of dizziness. Patient states she started feeling poorly and all the night when she got up to go to the bathroom. She reports sensation of spinning. She reports falling down and crawling into her bed. She states she is able to go to sleep, woke up early this morning still having some symptoms. She is able to go to work, where her daughter apparently called EMS. Patient states that she feels "cloudy headed." She reports sensation of surroundings spinning, reports dizziness. She denies any headache. No nausea or vomiting. No diarrhea. No chest pain or abdominal pain.  No other complaints.   Patient Active Problem List   Diagnosis Date Noted  . Adjustment disorder with mixed anxiety and depressed mood 06/26/2016  . Depressed mood 06/26/2016  . Back pain 01/31/2016  . 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 Acute pericarditis 12/26/2015  . Hidradenitis suppurativa 11/07/2015  . 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 Sinusitis 10/20/2015  . Complicated migraine 74/16/3845  . Marijuana use 10/20/2015  . Smoker 05/04/2015  . TIA (transient ischemic attack) 05/04/2015  . Palpitations 05/04/2015  . Shortness of breath 05/04/2015  . Decreased appetite 05/04/2015  . Rash and nonspecific skin eruption 05/04/2015  . 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 Hyperlipidemia 04/22/2015  . TOA (tubo-ovarian abscess) 11/23/2012  . History of abnormal Pap smear 12/20/2011  . History of PID 12/20/2011    Past Surgical History:  Procedure Laterality Date  . LEEP      OB History    Gravida Para Term Preterm AB Living   2       1 1    SAB TAB Ectopic Multiple Live Births   1               Home Medications    Prior to Admission medications   Medication Sig Start Date End Date Taking? Authorizing Provider  acetaminophen (TYLENOL) 500 MG tablet Take 1,000 mg by mouth every 6 (six) hours as needed for mild pain.   Yes [provider]  doxycycline (VIBRA-TABS) 100 MG tablet Take 1 tablet (100 mg total) by mouth 2 (two) times daily. 09/19/16  Yes Opalski, Neoma Laming, DO  PARoxetine (PAXIL) 10 MG tablet Take 1 tablet (10 mg total) by mouth 2 (two) times daily. 06/27/16  Yes Opalski, Deborah, DO  albuterol (PROVENTIL HFA;VENTOLIN HFA) 108 (90 Base) MCG/ACT inhaler Inhale 1-2 puffs into the lungs every 4 (four) hours as needed for wheezing or shortness of breath. 09/19/16 09/19/17  Opalski, Neoma Laming, DO  chlorpheniramine-HYDROcodone (TUSSIONEX) 10-8 MG/5ML SUER Take 5 mLs by mouth every 12 (twelve) hours as needed for cough (cough, will cause drowsiness.). Patient not taking: Reported on 03/06/2017 09/19/16   Mellody Dance, DO  promethazine (PHENERGAN) 25 MG tablet 1/2 to 1 tablet every 4-6 hours as needed for nausea. Patient not taking: Reported on 09/19/2016 06/27/16   Mellody Dance, DO    Family History Family History  Problem Relation Age of Onset  . Hypertension Mother   . Lupus Mother   . Diabetes Father   . Thyroid disease Sister   . Diabetes Sister   .  Cancer Sister        stomach  . Diabetes Maternal Grandmother   . Anesthesia problems Neg Hx   . Other Neg Hx   . Migraines Neg Hx     Social History Social History  Substance Use Topics  . Smoking status: Current Every Day Smoker    Packs/day: 0.25    Years: 17.00  . Smokeless tobacco: Never Used     Comment: 3 cigarettes a day   . Alcohol use 0.6 oz/week    1 Shots of liquor per week     Comment: drink on the weekends     Allergies   Patient has no known allergies.   Review of Systems Review of Systems  Constitutional:  Positive for fatigue. Negative for chills and fever.  Respiratory: Negative for cough, chest tightness and shortness of breath.   Cardiovascular: Negative for chest pain, palpitations and leg swelling.  Gastrointestinal: Positive for nausea. Negative for abdominal pain, diarrhea and vomiting.  Genitourinary: Negative for dysuria, flank pain, pelvic pain, vaginal bleeding, vaginal discharge and vaginal pain.  Musculoskeletal: Negative for arthralgias, myalgias, neck pain and neck stiffness.  Skin: Negative for rash.  Neurological: Positive for dizziness, weakness and light-headedness. Negative for headaches.  All other systems reviewed and are negative.    Physical Exam Updated Vital Signs BP 104/66   Pulse 62   Resp 20   SpO2 100%   Physical Exam  Constitutional: She is oriented to person, place, and time. She appears well-developed and well-nourished. No distress.  HENT:  Head: Normocephalic.  Eyes: Conjunctivae and EOM are normal. Pupils are equal, round, and reactive to light.  Horizontal nystagmus  Neck: Neck supple.  Cardiovascular: Normal rate, regular rhythm and normal heart sounds.   Pulmonary/Chest: Effort normal and breath sounds normal. No respiratory distress. She has no wheezes. She has no rales.  Abdominal: Soft. Bowel sounds are normal. She exhibits no distension. There is no tenderness. There is no rebound.  Musculoskeletal: She exhibits no edema.  Neurological: She is alert and oriented to person, place, and time.  5/5 and equal upper and lower extremity strength bilaterally. Equal grip strength bilaterally. Normal finger to nose and heel to shin. No pronator drift.  Skin: Skin is warm and dry.  Psychiatric: She has a normal mood and affect. Her behavior is normal.  Nursing note and vitals reviewed.    ED Treatments / Results  Labs (all labs ordered are listed, but only abnormal results are displayed) Labs Reviewed  BASIC METABOLIC PANEL - Abnormal; Notable  for the following:       Result Value   Potassium 3.3 (*)    Glucose, Bld 103 (*)    All other components within normal limits  URINALYSIS, ROUTINE W REFLEX MICROSCOPIC - Abnormal; Notable for the following:    APPearance HAZY (*)    Hgb urine dipstick MODERATE (*)    Protein, ur 30 (*)    Leukocytes, UA TRACE (*)    Squamous Epithelial / LPF 6-30 (*)    All other components within normal limits  RAPID URINE DRUG SCREEN, HOSP PERFORMED - Abnormal; Notable for the following:    Tetrahydrocannabinol POSITIVE (*)    All other components within normal limits  CBC  PREGNANCY, URINE  TROPONIN I    EKG  EKG Interpretation  Date/Time:  Wednesday March 06 2017 10:06:02 EDT Ventricular Rate:  75 PR Interval:  130 QRS Duration: 74 QT Interval:  420 QTC Calculation: 469 R Axis:  55 Text Interpretation:  Normal sinus rhythm Possible Left atrial enlargement Borderline ECG Confirmed by Wilson Singer  MD, STEPHEN [phone]) on 03/06/2017 11:05:02 AM       Radiology Ct Head Wo Contrast  Result Date: 03/06/2017 CLINICAL DATA:  Confusion.  Fall today EXAM: CT HEAD WITHOUT CONTRAST TECHNIQUE: Contiguous axial images were obtained from the base of the skull through the vertex without intravenous contrast. COMPARISON:  MRI head 10/20/2015 FINDINGS: Brain: No evidence of acute infarction, hemorrhage, hydrocephalus, extra-axial collection or mass lesion/mass effect. Empty sella again noted and unchanged. Vascular: No hyperdense vessel or unexpected calcification. Skull: Negative Sinuses/Orbits: Mucosal edema in the paranasal sinuses. Normal orbit. Other: None IMPRESSION: No acute intracranial abnormality Mucosal edema in the paranasal sinuses. Electronically Signed   By: Franchot Gallo M.D.   On: 03/06/2017 12:54    Procedures Procedures (including critical care time)  Medications Ordered in ED Medications  diazepam (VALIUM) tablet 5 mg (5 mg Oral Given 03/06/17 1105)  sodium chloride 0.9 % bolus 1,000 mL  (1,000 mLs Intravenous New Bag/Given 03/06/17 1205)  potassium chloride SA (K-DUR,KLOR-CON) CR tablet 40 mEq (40 mEq Oral Given 03/06/17 1205)     Initial Impression / Assessment and Plan / ED Course  I have reviewed the triage vital signs and the nursing notes.  Pertinent labs & imaging results that were available during my care of the patient were reviewed by me and considered in my medical decision making (see chart for details).       Pt very anxious, states worried that she had a stroke, or heart attack, or stats she is not sure what is going on. Describes vertigo symptoms. No pain at this time.will get labs, orthostatic vitals, CT head.   1:09 PM Patient was slightly orthostatic, ordered IV fluids. She received 1 L of normal saline. She received 5 mg of Valium for vertigo-like symptoms and anxiety and feels much better. She is ambulating with no difficulty and no symptoms. Her CT scan did show sinus disease, otherwise negative. I question whether she may have vertigo from inner ear inflammation and from sinusitis. I will start her on meclizine and Flonase. Will have a follow-up with the family doctor.  Vitals:   03/06/17 1006 03/06/17 1045 03/06/17 1130 03/06/17 1230  BP: (!) 139/106 (!) 134/103 104/66 111/61  Pulse: 77 64 62 63  Resp: 18 13 20 18   SpO2: 100% 99% 100% 100%     Final Clinical Impressions(s) / ED Diagnoses   Final diagnoses:  Vertigo  Dizziness  Sphenoid sinusitis, unspecified chronicity    New Prescriptions New Prescriptions   FLUTICASONE (FLONASE) 50 MCG/ACT NASAL SPRAY    Place 2 sprays into both nostrils daily.   MECLIZINE (ANTIVERT) 25 MG TABLET    Take 1 tablet (25 mg total) by mouth 3 (three) times daily as needed for dizziness.     Jeannett Senior, PA-C 03/06/17 1316    Virgel Manifold, MD 03/07/17 [phone]

## 2017-03-06 NOTE — ED Triage Notes (Signed)
Pt reports waking up at 0200 feeling anxious and nauseated. Got up to use restroom and reports feeling dizzy and unsteady gait, then had a fall. ekg done at triage and no acute distress is noted.

## 2017-03-06 NOTE — Discharge Instructions (Signed)
Drink plenty of fluids. Rest. Flonase for sinusitis as prescribed daily. Meclizine for dizziness. Follow-up with family doctor.

## 2017-06-22 ENCOUNTER — Encounter (HOSPITAL_COMMUNITY): Payer: Self-pay | Admitting: Emergency Medicine

## 2017-06-22 ENCOUNTER — Emergency Department (HOSPITAL_COMMUNITY)
Admission: EM | Admit: 2017-06-22 | Discharge: 2017-06-22 | Disposition: A | Discharge status: Home / Self Care | Payer: No Typology Code available for payment source | Attending: Emergency Medicine | Admitting: Emergency Medicine

## 2017-06-22 DIAGNOSIS — F17210 Nicotine dependence, cigarettes, uncomplicated: Secondary | ICD-10-CM | POA: Diagnosis not present

## 2017-06-22 DIAGNOSIS — S161XXA Strain of muscle, fascia and tendon at neck level, initial encounter: Secondary | ICD-10-CM | POA: Insufficient documentation

## 2017-06-22 DIAGNOSIS — S29012A Strain of muscle and tendon of back wall of thorax, initial encounter: Secondary | ICD-10-CM | POA: Insufficient documentation

## 2017-06-22 DIAGNOSIS — Y92524 Gas station as the place of occurrence of the external cause: Secondary | ICD-10-CM | POA: Insufficient documentation

## 2017-06-22 DIAGNOSIS — Y939 Activity, unspecified: Secondary | ICD-10-CM | POA: Diagnosis not present

## 2017-06-22 DIAGNOSIS — Y999 Unspecified external cause status: Secondary | ICD-10-CM | POA: Diagnosis not present

## 2017-06-22 DIAGNOSIS — S199XXA Unspecified injury of neck, initial encounter: Secondary | ICD-10-CM | POA: Diagnosis present

## 2017-06-22 DIAGNOSIS — S39012A Strain of muscle, fascia and tendon of lower back, initial encounter: Secondary | ICD-10-CM

## 2017-06-22 LAB — POC URINE PREG, ED: Preg Test, Ur: NEGATIVE

## 2017-06-22 MED ORDER — KETOROLAC TROMETHAMINE 30 MG/ML IJ SOLN
30.0000 mg | Freq: Once | INTRAMUSCULAR | Status: AC
  Administered 2017-06-22: 30 mg via INTRAMUSCULAR
  Filled 2017-06-22: qty 1

## 2017-06-22 MED ORDER — DIAZEPAM 5 MG PO TABS
5.0000 mg | ORAL_TABLET | Freq: Once | ORAL | Status: AC
  Administered 2017-06-22: 5 mg via ORAL
  Filled 2017-06-22: qty 1

## 2017-06-22 MED ORDER — IBUPROFEN 800 MG PO TABS: 800.0000 mg | ORAL_TABLET | Freq: Three times a day (TID) | ORAL | 0 refills | Status: AC | PRN

## 2017-06-22 MED ORDER — CYCLOBENZAPRINE HCL 10 MG PO TABS: 10.0000 mg | ORAL_TABLET | Freq: Two times a day (BID) | ORAL | 0 refills | Status: DC | PRN
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
## 2017-09-05 ENCOUNTER — Inpatient Hospital Stay (HOSPITAL_COMMUNITY)
Admission: AD | Admit: 2017-09-05 | Discharge: 2017-09-05 | Discharge status: Against Medical Advice | Payer: Self-pay | Source: Ambulatory Visit | Attending: Obstetrics & Gynecology | Admitting: Obstetrics & Gynecology

## 2017-09-05 ENCOUNTER — Encounter (HOSPITAL_COMMUNITY): Payer: Self-pay | Admitting: *Deleted

## 2017-09-05 DIAGNOSIS — R140 Abdominal distension (gaseous): Secondary | ICD-10-CM | POA: Insufficient documentation

## 2017-09-05 DIAGNOSIS — Z79899 Other long term (current) drug therapy: Secondary | ICD-10-CM | POA: Insufficient documentation

## 2017-09-05 DIAGNOSIS — R110 Nausea: Secondary | ICD-10-CM | POA: Insufficient documentation

## 2017-09-05 DIAGNOSIS — F17210 Nicotine dependence, cigarettes, uncomplicated: Secondary | ICD-10-CM | POA: Insufficient documentation

## 2017-09-05 DIAGNOSIS — N912 Amenorrhea, unspecified: Secondary | ICD-10-CM | POA: Insufficient documentation

## 2017-09-05 DIAGNOSIS — E785 Hyperlipidemia, unspecified: Secondary | ICD-10-CM | POA: Insufficient documentation

## 2017-09-05 LAB — URINALYSIS, ROUTINE W REFLEX MICROSCOPIC
Bilirubin Urine: NEGATIVE
Glucose, UA: NEGATIVE mg/dL
Ketones, ur: NEGATIVE mg/dL
Leukocytes, UA: NEGATIVE
NITRITE: NEGATIVE
PROTEIN: NEGATIVE mg/dL
Specific Gravity, Urine: 1.013 (ref 1.005–1.030)
pH: 5 (ref 5.0–8.0)

## 2017-09-05 LAB — POCT PREGNANCY, URINE: PREG TEST UR: NEGATIVE

## 2017-09-05 NOTE — MAU Note (Signed)
Pt in in lobby x 3

## 2017-09-05 NOTE — MAU Provider Note (Signed)
History     CSN: [id number]  Arrival date and time: 09/05/17 1204   First Provider Initiated Contact with Patient 09/05/17 1448      Chief Complaint  Patient presents with  . Possible Pregnancy   Samantha [name] is a 41 y.o. G2P0011 who presents today with missed menstrual period. She states that her LMP around 11/2-11/17/18. She denies any abdominal pain pain or vaginal bleeding today. She does report nausea and bloating. She had one +UPT at home, and one - UPT at home. She is not currently on any birth control at this time.    Possible Pregnancy  This is a new problem. The current episode started more than 1 month ago. The problem occurs constantly. The problem has been unchanged. Associated symptoms include nausea. Pertinent negatives include no chills, fever or vomiting. Nothing aggravates the symptoms. She has tried nothing for the symptoms.     Past Medical History:  Diagnosis Date  . Back pain   . 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 Hyperlipidemia   . Medical history non-contributory   . TIA (transient ischemic attack) 04/2015   questionable, most likely complicated migraine    Past Surgical History:  Procedure Laterality Date  . LEEP      Family History  Problem Relation Age of Onset  . Hypertension Mother   . Lupus Mother   . Diabetes Father   . Thyroid disease Sister   . Diabetes Sister   . Cancer Sister        stomach  . Diabetes Maternal Grandmother   . Anesthesia problems Neg Hx   . Other Neg Hx   . Migraines Neg Hx     Social History   Tobacco Use  . Smoking status: Current Every Day Smoker    Packs/day: 0.25    Years: 17.00    Pack years: 4.25  . Smokeless tobacco: Never Used  . Tobacco comment: 3 cigarettes a day   Substance Use Topics  . Alcohol use: Yes    Alcohol/week: 0.6 oz    Types: 1 Shots of liquor per week    Comment: drink on the weekends  . Drug use: Yes    Types: Marijuana    Allergies: No Known Allergies  Medications Prior to  Admission  Medication Sig Dispense Refill Last Dose  . albuterol (PROVENTIL HFA;VENTOLIN HFA) 108 (90 Base) MCG/ACT inhaler Inhale 1-2 puffs into the lungs every 4 (four) hours as needed for wheezing or shortness of breath. (Patient not taking: Reported on 06/22/2017) 1 Inhaler 3 Not Taking at Unknown time  . chlorpheniramine-HYDROcodone (TUSSIONEX) 10-8 MG/5ML SUER Take 5 mLs by mouth every 12 (twelve) hours as needed for cough (cough, will cause drowsiness.). (Patient not taking: Reported on 03/06/2017) 120 mL 0 Not Taking at Unknown time  . cyclobenzaprine (FLEXERIL) 10 MG tablet Take 1 tablet (10 mg total) by mouth 2 (two) times daily as needed for muscle spasms. 8 tablet 0   . doxycycline (VIBRA-TABS) 100 MG tablet Take 1 tablet (100 mg total) by mouth 2 (two) times daily. (Patient not taking: Reported on 06/22/2017) 20 tablet 0 Not Taking at Unknown time  . fluticasone (FLONASE) 50 MCG/ACT nasal spray Place 2 sprays into both nostrils daily. (Patient not taking: Reported on 06/22/2017) 16 g 2 Not Taking at Unknown time  . meclizine (ANTIVERT) 25 MG tablet Take 1 tablet (25 mg total) by mouth 3 (three) times daily as needed for dizziness. (Patient not taking: Reported on 06/22/2017) 30 tablet 0  Not Taking at Unknown time  . PARoxetine (PAXIL) 10 MG tablet Take 1 tablet (10 mg total) by mouth 2 (two) times daily. (Patient not taking: Reported on 06/22/2017) 60 tablet 0 Not Taking at Unknown time  . promethazine (PHENERGAN) 25 MG tablet 1/2 to 1 tablet every 4-6 hours as needed for nausea. (Patient not taking: Reported on 09/19/2016) 36 tablet 0 Not Taking    Review of Systems  Constitutional: Negative for chills and fever.       Hot flashes  Gastrointestinal: Positive for nausea. Negative for vomiting.  Genitourinary: Negative for dysuria, frequency, pelvic pain, vaginal bleeding and vaginal discharge.   Physical Exam   Blood pressure 126/75, pulse 76, temperature 98.3 F (36.8 C), temperature  source Oral, resp. rate 16, height 5\' 4"  (1.626 m), weight 148 lb (67.1 kg), last menstrual period 06/11/2017, SpO2 100 %.  Physical Exam  Nursing note and vitals reviewed. Constitutional: She is oriented to person, place, and time. She appears well-developed and well-nourished. No distress.  HENT:  Head: Normocephalic.  Cardiovascular: Normal rate.  Respiratory: Effort normal.  GI: Soft. There is no tenderness. There is no rebound.  Neurological: She is alert and oriented to person, place, and time.  Skin: Skin is warm and dry.  Psychiatric: She has a normal mood and affect.    Results for orders placed or performed during the hospital encounter of 09/05/17 (from the past 24 hour(s))  Urinalysis, Routine w reflex microscopic     Status: Abnormal   Collection Time: 09/05/17 12:40 PM  Result Value Ref Range   Color, Urine STRAW (A) YELLOW   APPearance CLEAR CLEAR   Specific Gravity, Urine 1.013 1.005 - 1.030   pH 5.0 5.0 - 8.0   Glucose, UA NEGATIVE NEGATIVE mg/dL   Hgb urine dipstick SMALL (A) NEGATIVE   Bilirubin Urine NEGATIVE NEGATIVE   Ketones, ur NEGATIVE NEGATIVE mg/dL   Protein, ur NEGATIVE NEGATIVE mg/dL   Nitrite NEGATIVE NEGATIVE   Leukocytes, UA NEGATIVE NEGATIVE   RBC / HPF 0-5 0 - 5 RBC/hpf   WBC, UA 0-5 0 - 5 WBC/hpf   Bacteria, UA RARE (A) NONE SEEN   Squamous Epithelial / LPF 0-5 (A) NONE SEEN   Mucus PRESENT   Pregnancy, urine POC     Status: None   Collection Time: 09/05/17 12:53 PM  Result Value Ref Range   Preg Test, Ur NEGATIVE NEGATIVE    MAU Course  Procedures  MDM  DW patient that amenorrhea can be from various causes. UPT is negative here. Will do serum HCG to confirm because she did have one +UPT at home. If negative patient encouraged to see PCP for work up for amenorrhea.   Lab has called the patient from the lobby to collect HCG x 2, and she has not been there.   Assessment and Plan   1. Amenorrhea    Patient left AMA before having  blood drawn for serum HCG.   Marcille Buffy 09/05/2017, 2:49 PM

## 2017-09-05 NOTE — MAU Note (Signed)
Pt reports she has not had a period in 2 months and one home test was ? Positive and the next one was negative.

## 2017-09-05 NOTE — Discharge Instructions (Signed)
In late 2019, the [facility] will be moving to the [place] campus. At that time, the MAU (Maternity Admissions Unit), where you are being seen today, will no longer take care of non-pregnant patients. We strongly encourage you to find a doctor's office before that time, so that you can be seen with any GYN concerns, like vaginal discharge, urinary tract infection, etc.. in a timely manner. ° °In order to make an office visit more convenient, the [facility] will be offering evening hours with same-day appointments, walk-in appointments and scheduled appointments available during this time. ° °[facility] Hours: °Monday - 8am - 7:30 pm with walk-in between 4pm- 7:30 pm °Tuesday - 8 am - 5 pm (starting 12/10/17 we will be open late and accepting walk-ins from 4pm - 7:30pm) °Wednesday - 8 am - 5 pm (starting 03/12/18 we will be open late and accepting walk-ins from 4pm - 7:30pm) °Thursday 8 am - 5 pm (starting 06/12/18 we will be open late and accepting walk-ins from 4pm - 7:30pm) °Friday 8 am - 5 pm ° °For an appointment please call the [facility] at [phone] ° °For urgent needs, [facility] is also available for management of urgent GYN complaints such as vaginal discharge or urinary tract infections. ° °Primary care follow up  °[facility] (will see you even if you do not have sickle cell): [phone] °[facility]: [phone] °[facility]: [phone] ° ° ° °

## 2017-09-17 ENCOUNTER — Ambulatory Visit (INDEPENDENT_AMBULATORY_CARE_PROVIDER_SITE_OTHER): Payer: Self-pay

## 2017-09-17 ENCOUNTER — Inpatient Hospital Stay (HOSPITAL_COMMUNITY)
Admission: AD | Admit: 2017-09-17 | Discharge: 2017-09-17 | Disposition: A | Discharge status: Home / Self Care | Payer: Self-pay | Source: Ambulatory Visit | Attending: Obstetrics and Gynecology | Admitting: Obstetrics and Gynecology

## 2017-09-17 ENCOUNTER — Other Ambulatory Visit: Payer: Self-pay

## 2017-09-17 DIAGNOSIS — Z3202 Encounter for pregnancy test, result negative: Secondary | ICD-10-CM

## 2017-09-17 DIAGNOSIS — N912 Amenorrhea, unspecified: Secondary | ICD-10-CM

## 2017-09-17 LAB — POCT PREGNANCY, URINE: Preg Test, Ur: NEGATIVE

## 2017-09-17 NOTE — MAU Provider Note (Signed)
[name] is a 42 y.o. G2P0011 who present to MAU today for pregnancy confirmation. She denies abdominal pain or vaginal bleeding.   BP 125/74 (BP Location: Right Arm)   Pulse 72   Temp 98.6 F (37 C)   Resp 18   Ht 5\' 4"  (1.626 m)   Wt 149 lb 8 oz (67.8 kg)   LMP 06/11/2017 (Approximate)   SpO2 100%   BMI 25.66 kg/m  CONSTITUTIONAL: Well-developed, well-nourished female in no acute distress.  CARDIOVASCULAR: Normal heart rate noted RESPIRATORY: Effort and breath sounds normal GASTROINTESTINAL:Soft, no distention noted.  No tenderness, rebound or guarding.  SKIN: Skin is warm and dry. No rash noted. Not diaphoretic. No erythema. No pallor. PSYCHIATRIC: Normal mood and affect. Normal behavior. Normal judgment and thought content.  MDM Medical screening exam complete Patient does not endorse any symptoms concerning for ectopic pregnancy or pregnancy related complication today.   A:  Amenorrhea  P: Discharge home Patient advised that she can present as a walk-in to [facility] for a pregnancy test M-Th between 8am-4pm or Friday between 8am -11am Reasons to return to MAU reviewed  Patient may return to MAU as needed or if her condition were to change or worsen  Laury Deep, CNM 09/17/2017 11:21 AM

## 2017-09-17 NOTE — MAU Note (Signed)
Pt presents for pregnancy test, reports no period since October.  Reports took UPT on Christmas, test positive, but "line faint.  Repeated UPT the following morning, test negative.  Seen here in MAU and UPT negative, no HCG level done d/t pt needing to leave.

## 2017-09-17 NOTE — Progress Notes (Signed)
Patient presented to the office for a UPT. UPT negative. Pt stated that she has not had her period since 10/18. Patient stated that she took a pregnancy test on 09/03/17  had a faint positive line . Stated that she took a UPT the following morning and it was negative. States that she has regular periods and it usually comes around the 2nd day of each month. Advised pt to schedule appointment.

## 2017-09-18 ENCOUNTER — Ambulatory Visit (INDEPENDENT_AMBULATORY_CARE_PROVIDER_SITE_OTHER): Payer: Self-pay | Admitting: Clinical

## 2017-09-18 ENCOUNTER — Ambulatory Visit (INDEPENDENT_AMBULATORY_CARE_PROVIDER_SITE_OTHER): Payer: Self-pay | Admitting: Obstetrics & Gynecology

## 2017-09-18 ENCOUNTER — Encounter: Payer: Self-pay | Admitting: Obstetrics & Gynecology

## 2017-09-18 VITALS — BP 98/75 | HR 79 | Ht 64.0 in | Wt 148.4 lb

## 2017-09-18 DIAGNOSIS — Z1151 Encounter for screening for human papillomavirus (HPV): Secondary | ICD-10-CM

## 2017-09-18 DIAGNOSIS — Z124 Encounter for screening for malignant neoplasm of cervix: Secondary | ICD-10-CM

## 2017-09-18 DIAGNOSIS — F332 Major depressive disorder, recurrent severe without psychotic features: Secondary | ICD-10-CM

## 2017-09-18 DIAGNOSIS — Z01419 Encounter for gynecological examination (general) (routine) without abnormal findings: Secondary | ICD-10-CM

## 2017-09-18 DIAGNOSIS — Z0000 Encounter for general adult medical examination without abnormal findings: Secondary | ICD-10-CM

## 2017-09-18 DIAGNOSIS — N912 Amenorrhea, unspecified: Secondary | ICD-10-CM

## 2017-09-18 DIAGNOSIS — Z23 Encounter for immunization: Secondary | ICD-10-CM

## 2017-09-18 DIAGNOSIS — F439 Reaction to severe stress, unspecified: Secondary | ICD-10-CM

## 2017-09-18 NOTE — Progress Notes (Signed)
Chart reviewed for nurse visit. Agree with plan of care.   Noni Saupe I, NP 09/18/2017 8:27 AM

## 2017-09-18 NOTE — Patient Instructions (Signed)
My Safety Plan:   Step 1: Warning signs (thoughts, images, mood, situation, behavior) that a crisis may be developing: When I start to feel lonely  Step 2: Internal coping strategies: Things I can do to take my mind off my problems without contacting another person (music, relaxation technique, physical activity): Listen to music  Step 3: People I can ask for help:  Name, relationship, contact: Niece Roselyn Reef [phone] Name, relationship, contact: Bertram Millard, [phone]   Step 5: Agencies I can contact during a crisis:      1. 9-1-1     2. [employer] (24/7 walk-in) [address] [place], [place]     3. [facility]: Intake- [id number] [phone]     4. Closest Emergency Room Address: Cathlean Sauer below)  Suicide Prevention Lifeline Phone: [phone]  Step 6: Making the environment safe- Have friend or family remove from home: [name] will do this for me * Weapons in the home * Medication in the home (including Tylenol)  Step 7: The one thing that is most important to me and worth living for is: my child and my sister and my mom  Signature of Patient: _________________________________________________  Signature of Provider: ________________________________________________  [facility], [place], [place]. [facility] [address] [place], [place]  [facility] [address], [place], [place]  [facility] [address] [phone]  [facility] [address] [place], [place]

## 2017-09-18 NOTE — Progress Notes (Signed)
amb Subjective:    Samantha [name] is a 42 y.o. single P34 (72 yo daughter) female who presents for an annual exam.  The patient is sexually active. GYN screening history: pap smears normal, last 2013. Has LEEP for abnormal pap smear. The patient wears seatbelts: yes. The patient participates in regular exercise: no (wants to start). Has the patient ever been transfused or tattooed?: yes, tattoo. The patient reports that there is not domestic violence in her life.   Menstrual History: OB History    Gravida Para Term Preterm AB Living   2       1 1    SAB TAB Ectopic Multiple Live Births   1              Menarche age: middle school Patient's last menstrual period was 06/11/2017 (exact date).   Wanting another child, took pregnancy tests, reports one was "faintly positive."  Has been experiencing some breast pain "heaviness, tender", bloating, feels "cycle of sickness" - will feel "off" in cycles Had been having monthly periods, would bleed for 2 weeks. Was on Depo until 3 or 4 years ago (due to irregular bleeding), regular periods resumed beginning of 2018. No pain with intercourse.  Has noticed hot flashes for at least the past month. Has been feeling more irritable recently.  Quit job in September, was working at [employer].  The following portions of the patient's history were reviewed and updated as appropriate: allergies, current medications, past family history, past medical history, past social history, past surgical history and problem list.  Review of Systems Pertinent items are noted in HPI.    Received flu shot today No PCP (lost insurance) Having some HA, nausea that comes off and on Has hidradinitis suppuritiva, ran out of anx. No medication allergies  Objective:    BP 98/75   Pulse 79   Ht 5\' 4"  (1.626 m)   Wt 148 lb 6.4 oz (67.3 kg)   LMP 06/11/2017 (Exact Date)   BMI 25.47 kg/m   General Appearance:    Alert, cooperative, no distress, appears stated age  Head:     Normocephalic, without obvious abnormality, atraumatic  Eyes:    PERRL, conjunctiva/corneas clear, EOM's intact, fundi    benign, both eyes  Ears:    Normal TM's and external ear canals, both ears  Nose:   Nares normal, septum midline, mucosa normal, no drainage    or sinus tenderness  Throat:   Lips, mucosa, and tongue normal; teeth and gums normal  Neck:   Supple, symmetrical, trachea midline, no adenopathy;    thyroid:  no enlargement/tenderness/nodules; no carotid   bruit or JVD  Back:     Symmetric, no curvature, ROM normal, no CVA tenderness  Lungs:     Clear to auscultation bilaterally, respirations unlabored  Chest Wall:    No tenderness or deformity   Heart:    Regular rate and rhythm, S1 and S2 normal, no murmur, rub   or gallop  Breast Exam:    No tenderness, masses, or nipple abnormality  Abdomen:     Soft, non-tender, bowel sounds active all four quadrants,    no masses, no organomegaly  Genitalia:    Normal female without lesion, discharge or tenderness, normal size and shape, midplane, non-tender, normal adnexal exam      Extremities:   Extremities normal, atraumatic, no cyanosis or edema  Pulses:   2+ and symmetric all extremities  Skin:   Skin color, texture, turgor normal, no  rashes or lesions  Lymph nodes:   Cervical, supraclavicular, and axillary nodes normal  Neurologic:   CNII-XII intact, normal strength, sensation and reflexes    throughout  .    Assessment:    Healthy female exam.    Plan:     Thin prep Pap smear. with cotesting Mammogram scholarship Gardasil, TDAP, and flu vaccines given Come back 2 months for #2 Gardasil See [facility] for stress

## 2017-09-18 NOTE — BH Specialist Note (Addendum)
Integrated [facility]al [facility] Initial Visit  MRN: [id number] Name: Samantha [name]  Number of [facility] Clinician visits:: 1/6 Session Start time: 3:49  Session End time: 4:50 Total time: 1 hour  Type of Service: [place] Interpretor:No. Interpretor Name and Language: n/a   Warm Hand Off Completed.       SUBJECTIVE: Samantha [name] is a 42 y.o. female accompanied by n/a Patient was referred by Dr Hulan Fray for depression,stress Patient reports the following symptoms/concerns: Pt states that she feels lonely, fatigue, chronic back pain, migraines, depressed, sleep difficulty, lack of appetite, feeling sad about going into menopause, panic attacks, and feeling grief over loss of 4 friends/family members in the past year, worry about finances, and being uninsured. Pt has had suicidal thoughts "for a long time"(years), but denies intent, denies plan. Pt says she would not take her life as her daughter and mother need her, and it would be a sin. Pt doesn't want to take medication, but may consider in the future, if symptoms do not improve. Duration of problem: Ongoing for undetermined amount of time; Severity of problem: severe  OBJECTIVE: Mood: Anxious, Depressed and Hopeless and Affect: Depressed and Tearful Risk of harm to self or others: Suicidal ideation No plan to harm self or others  LIFE CONTEXT: Family and Social: Lives by herself in section 8 housing; helps care for her mother, and has good relationship with her grown daughter. Also close to niece. School/Work: Quit job at [employer] recently, because of chronic pain Self-Care: - Life Changes: Perimenopause, job loss, loss of at least 4 friends/family members in past year  GOALS ADDRESSED: Patient will: 1. Reduce symptoms of: anxiety, depression and stress 2. Increase knowledge and/or ability of: self-management skills  3. Demonstrate ability to: Increase healthy adjustment to  current life circumstances, Increase adequate support systems for patient/family and Begin healthy grieving over loss  INTERVENTIONS: Interventions utilized: Mindfulness or Psychologist, educational, Psychoeducation and/or Health Education and Link to [employer]  Standardized Assessments completed: GAD-7 and PHQ 9  ASSESSMENT: Patient currently experiencing Major depressive disorder, severe, without psychotic features.   Patient may benefit from psychoeducation and brief therapeutic interventions regarding coping with symptoms of depression and anxiety with panic attacks.Marland Kitchen  PLAN: 1. Follow up with behavioral health clinician on : One day by phone for mood check. Recommend f/u in one week.  2. Behavioral recommendations:  -Follow safety plan, to keep yourself safe -Consider [facility] walk-in clinics to establish care with ongoing therapy -Make a rice sock at home today; use on back for pain; continue for as long as remains helpful -Consider using CALM relaxation breathing exercise or calming app every morning with breakfast -Read educational material regarding coping with symptoms of depression, anxiety with panic attacks; how antidepressants work in the body -Apply for orange card -Consider hospice group grief support 3. Referral(s): [facility] (In Clinic), [facility] (LME/Outside Clinic) and Community Resources:  Hospice grief support; [facility] 4. "From scale of 1-10, how likely are you to follow plan?": 7  Garlan Fair, LCSW  Depression screen [facility] 2/9 09/18/2017 06/26/2016 11/07/2015 05/04/2015  Decreased Interest 3 3 1  0  Down, Depressed, Hopeless 3 3 1  0  PHQ - 2 Score 6 6 2  0  Altered sleeping 3 3 1  -  Tired, decreased energy 3 3 1  -  Change in appetite 3 3 1  -  Feeling bad or failure about yourself  3 3 0 -  Trouble concentrating 0 3 0 -  Moving slowly or fidgety/restless 0 0 3  -  Suicidal thoughts 3 3 0 -  PHQ-9 Score 21 24 8  -  Difficult doing work/chores - - Somewhat difficult -   GAD 7 : Generalized Anxiety Score 09/18/2017  Nervous, Anxious, on Edge 3  Control/stop worrying 1  Worry too much - different things 1  Trouble relaxing 0  Restless 0  Easily annoyed or irritable 0  Afraid - awful might happen 0  Total GAD 7 Score 5

## 2017-09-19 ENCOUNTER — Telehealth: Payer: Self-pay | Admitting: Clinical

## 2017-09-19 LAB — TSH: TSH: 0.961 u[IU]/mL (ref 0.450–4.500)

## 2017-09-19 LAB — FOLLICLE STIMULATING HORMONE: FSH: 7.9 m[IU]/mL

## 2017-09-19 NOTE — Telephone Encounter (Addendum)
F/u call, as agreed-upon by pt and [facility] for mood check. Pt states that she is in much pain today, her shoulders hurt, "feels like they are tearing inside". Pt says she is using massager on them right now, and agrees to apply for insurance(orange card if needed) to obtain a PCP. Pt aware she will need a PCP for referral to pain clinic. Pt also aware she may come in to see [facility] before 11-14-2017 visit at [facility] as walk-in.

## 2017-09-21 ENCOUNTER — Encounter (HOSPITAL_COMMUNITY): Payer: Self-pay | Admitting: Emergency Medicine

## 2017-09-21 ENCOUNTER — Emergency Department (HOSPITAL_COMMUNITY): Payer: Self-pay

## 2017-09-21 ENCOUNTER — Emergency Department (HOSPITAL_COMMUNITY)
Admission: EM | Admit: 2017-09-21 | Discharge: 2017-09-21 | Disposition: A | Discharge status: Home / Self Care | Payer: Self-pay | Attending: Emergency Medicine | Admitting: Emergency Medicine

## 2017-09-21 ENCOUNTER — Other Ambulatory Visit: Payer: Self-pay

## 2017-09-21 DIAGNOSIS — F17210 Nicotine dependence, cigarettes, uncomplicated: Secondary | ICD-10-CM | POA: Insufficient documentation

## 2017-09-21 DIAGNOSIS — L02412 Cutaneous abscess of left axilla: Secondary | ICD-10-CM | POA: Insufficient documentation

## 2017-09-21 DIAGNOSIS — Z79899 Other long term (current) drug therapy: Secondary | ICD-10-CM | POA: Insufficient documentation

## 2017-09-21 DIAGNOSIS — M25511 Pain in right shoulder: Secondary | ICD-10-CM | POA: Insufficient documentation

## 2017-09-21 DIAGNOSIS — L02419 Cutaneous abscess of limb, unspecified: Secondary | ICD-10-CM

## 2017-09-21 MED ORDER — HYDROCODONE-ACETAMINOPHEN 5-325 MG PO TABS: 1.0000 | ORAL_TABLET | Freq: Four times a day (QID) | ORAL | 0 refills | Status: DC | PRN

## 2017-09-21 MED ORDER — DOXYCYCLINE HYCLATE 100 MG PO CAPS: 100.0000 mg | ORAL_CAPSULE | Freq: Two times a day (BID) | ORAL | 0 refills | Status: DC

## 2017-09-21 MED ORDER — PREDNISONE 10 MG PO TABS: 20.0000 mg | ORAL_TABLET | Freq: Two times a day (BID) | ORAL | 0 refills | Status: DC

## 2017-09-21 MED ORDER — HYDROMORPHONE HCL 1 MG/ML IJ SOLN
2.0000 mg | Freq: Once | INTRAMUSCULAR | Status: AC
  Administered 2017-09-21: 2 mg via INTRAMUSCULAR
  Filled 2017-09-21: qty 2

## 2017-09-21 NOTE — ED Provider Notes (Signed)
[facility] EMERGENCY DEPARTMENT Provider Note   CSN: [id number] Arrival date & time: 09/21/17  0054     History   Chief Complaint Chief Complaint  Patient presents with  . Arm Pain    HPI Samantha [name] is a 42 y.o. female.  Patient is a 42 year old female with no significant past medical history presenting for evaluation of severe right shoulder pain.  This began 3 weeks ago in the absence of any injury or trauma.  Over the past 2 days it has significantly worsened.  She reports pain to the front of her shoulder that is worse with any movement or palpation.  She denies any numbness or tingling.   The history is provided by the patient.  Arm Pain  This is a new problem. Episode onset: 3 weeks ago. The problem occurs constantly. The problem has been rapidly worsening. Pertinent negatives include no chest pain and no shortness of breath. Exacerbated by: Movement and palpation. Nothing relieves the symptoms.    Past Medical History:  Diagnosis Date  . Back pain   . 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 Hyperlipidemia   . Medical history non-contributory   . TIA (transient ischemic attack) 04/2015   questionable, most likely complicated migraine    Patient Active Problem List   Diagnosis Date Noted  . Amenorrhea 09/17/2017  . Adjustment disorder with mixed anxiety and depressed mood 06/26/2016  . Depressed mood 06/26/2016  . Back pain 01/31/2016  . 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 Acute pericarditis 12/26/2015  . Hidradenitis suppurativa 11/07/2015  . 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 Sinusitis 10/20/2015  . Complicated migraine 57/84/6962  . Marijuana use 10/20/2015  . Smoker 05/04/2015  . TIA (transient ischemic attack) 05/04/2015  . Palpitations 05/04/2015  . Shortness of breath 05/04/2015  . Decreased appetite 05/04/2015  . Rash and nonspecific skin eruption 05/04/2015  . 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 Hyperlipidemia 04/22/2015  .  TOA (tubo-ovarian abscess) 11/23/2012  . History of abnormal Pap smear 12/20/2011  . History of PID 12/20/2011    Past Surgical History:  Procedure Laterality Date  . LEEP      OB History    Gravida Para Term Preterm AB Living   2       1 1    SAB TAB Ectopic Multiple Live Births   1               Home Medications    Prior to Admission medications   Medication Sig Start Date End Date Taking? Authorizing Provider  albuterol (PROVENTIL HFA;VENTOLIN HFA) 108 (90 Base) MCG/ACT inhaler Inhale 1-2 puffs into the lungs every 4 (four) hours as needed for wheezing or shortness of breath. Patient not taking: Reported on 09/21/2017 09/19/16 09/21/25  Mellody Dance, DO  chlorpheniramine-HYDROcodone (TUSSIONEX) 10-8 MG/5ML SUER Take 5 mLs by mouth every 12 (twelve) hours as needed for cough (cough, will cause drowsiness.). Patient not taking: Reported on 03/06/2017 09/19/16   Mellody Dance, DO  cyclobenzaprine (FLEXERIL) 10 MG tablet Take 1 tablet (10 mg total) by mouth 2 (two) times daily as needed for muscle spasms. Patient not taking: Reported on 09/18/2017 06/22/17   Little, Wenda Overland, MD  doxycycline (VIBRA-TABS) 100 MG tablet Take 1 tablet (100 mg total) by mouth 2 (two) times daily. Patient not taking: Reported on 06/22/2017 09/19/16   Mellody Dance, DO  fluticasone (FLONASE) 50 MCG/ACT nasal spray Place 2 sprays into both nostrils daily. Patient not taking: Reported on 06/22/2017 03/06/17  Kirichenko, Tatyana, PA-C  meclizine (ANTIVERT) 25 MG tablet Take 1 tablet (25 mg total) by mouth 3 (three) times daily as needed for dizziness. Patient not taking: Reported on 06/22/2017 03/06/17   Jeannett Senior, PA-C  PARoxetine (PAXIL) 10 MG tablet Take 1 tablet (10 mg total) by mouth 2 (two) times daily. Patient not taking: Reported on 06/22/2017 06/27/16   Mellody Dance, DO  promethazine (PHENERGAN) 25 MG tablet 1/2 to 1 tablet every 4-6 hours as needed for nausea. Patient not  taking: Reported on 09/19/2016 06/27/16   Mellody Dance, DO    Family History Family History  Problem Relation Age of Onset  . Hypertension Mother   . Lupus Mother   . Diabetes Father   . Thyroid disease Sister   . Diabetes Sister   . Cancer Sister        stomach  . Diabetes Maternal Grandmother   . Anesthesia problems Neg Hx   . Other Neg Hx   . Migraines Neg Hx     Social History Social History   Tobacco Use  . Smoking status: Current Every Day Smoker    Packs/day: 1.00    Years: 17.00    Pack years: 17.00    Types: Cigarettes  . Smokeless tobacco: Never Used  . Tobacco comment: 3 cigarettes a day   Substance Use Topics  . Alcohol use: Yes    Alcohol/week: 0.6 oz    Types: 1 Shots of liquor per week    Comment: drink on the weekends  . Drug use: Yes    Types: Marijuana     Allergies   Patient has no known allergies.   Review of Systems Review of Systems  Respiratory: Negative for shortness of breath.   Cardiovascular: Negative for chest pain.  All other systems reviewed and are negative.    Physical Exam Updated Vital Signs BP (!) 138/95 (BP Location: Left Arm)   Pulse 94   Temp 99.3 F (37.4 C) (Oral)   Resp 20   Ht 5\' 4"  (1.626 m)   Wt 68 kg (150 lb)   SpO2 98%   BMI 25.75 kg/m   Physical Exam  Constitutional: She is oriented to person, place, and time. She appears well-developed and well-nourished. No distress.  HENT:  Head: Normocephalic and atraumatic.  Neck: Normal range of motion. Neck supple.  Musculoskeletal:  The right shoulder appears grossly normal.  There is no deformity.  She has severe pain with palpation or any range of motion.  She is able to flex, extend, and oppose all fingers.  Sensation is intact throughout the entire hand.  Ulnar and radial pulses are easily palpable.  Neurological: She is alert and oriented to person, place, and time.  Skin: Skin is warm and dry. She is not diaphoretic.  Nursing note and vitals  reviewed.    ED Treatments / Results  Labs (all labs ordered are listed, but only abnormal results are displayed) Labs Reviewed - No data to display  EKG  EKG Interpretation None       Radiology Dg Shoulder Right  Result Date: 09/21/2017 CLINICAL DATA:  42 year old female with right shoulder pain. EXAM: RIGHT SHOULDER - 2+ VIEW COMPARISON:  Chest radiograph dated 12/07/2015 FINDINGS: There is no evidence of fracture or dislocation. There is no evidence of arthropathy or other focal bone abnormality. Soft tissues are unremarkable. IMPRESSION: Negative. Electronically Signed   By: Anner Crete M.D.   On: 09/21/2017 01:36    Procedures Procedures (including  critical care time)  Medications Ordered in ED Medications  HYDROmorphone (DILAUDID) injection 2 mg (not administered)     Initial Impression / Assessment and Plan / ED Course  I have reviewed the triage vital signs and the nursing notes.  Pertinent labs & imaging results that were available during my care of the patient were reviewed by me and considered in my medical decision making (see chart for details).  Patient presents with complaints of right shoulder pain that began in the absence of any injury or trauma.  This is been going on for several weeks, however it significantly worsened over the past couple days.  Her examination reveals no obvious abnormality.  There is no deformity.  She has exquisite tenderness over the [facility] joint/anterior deltoid.  Her x-rays are negative and her arm is neurovascularly intact.  She will be treated with prednisone, and arm sling, pain medicine, and is to follow-up as needed if she does not improve.  She also has a boil under her left axilla.  She has a history of hidradenitis suppurativa and deals with these frequently.  She usually is prescribed doxycycline by her dermatologist.  She was offered incision and drainage, however she is declining this.  She will be prescribed doxycycline  and is to follow-up for this as needed.  Final Clinical Impressions(s) / ED Diagnoses   Final diagnoses:  None    ED Discharge Orders    None       Veryl Speak, MD 09/21/17 [phone]

## 2017-09-21 NOTE — ED Triage Notes (Signed)
Pt reports R shoulder pain present since Christmas, woke up with difficulty moved R arm. Pt also reports abscess to under L arm that she wants looked at.

## 2017-09-21 NOTE — ED Notes (Addendum)
Pt verbalizes understanding of d/c instructions. Pt received prescriptions. Pt taken to lobby in wheelchair at d/c with all belongings.   

## 2017-09-21 NOTE — Discharge Instructions (Signed)
Prednisone and doxycycline as prescribed.  Hydrocodone is prescribed as needed for pain.  Wear arm sling for the next several days for comfort, then slowly reintroduce activity.  Apply warm soaks to the axilla as frequently as possible for the next several days.  Return to the emergency department if symptoms significantly worsen or change.

## 2017-09-23 LAB — CYTOLOGY - PAP
DIAGNOSIS: NEGATIVE
HPV (WINDOPATH): NOT DETECTED

## 2017-09-26 ENCOUNTER — Ambulatory Visit (HOSPITAL_COMMUNITY)
Admission: EM | Admit: 2017-09-26 | Discharge: 2017-09-26 | Disposition: A | Discharge status: Home / Self Care | Payer: Self-pay | Attending: Internal Medicine | Admitting: Internal Medicine

## 2017-09-26 ENCOUNTER — Encounter (HOSPITAL_COMMUNITY): Payer: Self-pay | Admitting: Emergency Medicine

## 2017-09-26 DIAGNOSIS — Z833 Family history of diabetes mellitus: Secondary | ICD-10-CM | POA: Insufficient documentation

## 2017-09-26 DIAGNOSIS — L02412 Cutaneous abscess of left axilla: Secondary | ICD-10-CM | POA: Insufficient documentation

## 2017-09-26 DIAGNOSIS — Z8249 Family history of ischemic heart disease and other diseases of the circulatory system: Secondary | ICD-10-CM | POA: Insufficient documentation

## 2017-09-26 DIAGNOSIS — E785 Hyperlipidemia, unspecified: Secondary | ICD-10-CM | POA: Insufficient documentation

## 2017-09-26 DIAGNOSIS — M25511 Pain in right shoulder: Secondary | ICD-10-CM | POA: Insufficient documentation

## 2017-09-26 DIAGNOSIS — Z8349 Family history of other endocrine, nutritional and metabolic diseases: Secondary | ICD-10-CM | POA: Insufficient documentation

## 2017-09-26 DIAGNOSIS — F17210 Nicotine dependence, cigarettes, uncomplicated: Secondary | ICD-10-CM | POA: Insufficient documentation

## 2017-09-26 DIAGNOSIS — Z800 Family history of malignant neoplasm of digestive organs: Secondary | ICD-10-CM | POA: Insufficient documentation

## 2017-09-26 MED ORDER — ONDANSETRON 4 MG PO TBDP
4.0000 mg | ORAL_TABLET | Freq: Once | ORAL | Status: AC
  Administered 2017-09-26: 4 mg via ORAL

## 2017-09-26 MED ORDER — ONDANSETRON HCL 4 MG/2ML IJ SOLN: 4.0000 mg | Freq: Once | INTRAMUSCULAR | Status: DC

## 2017-09-26 MED ORDER — ONDANSETRON HCL 4 MG PO TABS: 4.0000 mg | ORAL_TABLET | Freq: Three times a day (TID) | ORAL | 0 refills | Status: DC | PRN

## 2017-09-26 MED ORDER — ONDANSETRON 4 MG PO TBDP
ORAL_TABLET | ORAL | Status: AC
  Filled 2017-09-26: qty 1

## 2017-09-26 MED ORDER — IBUPROFEN 800 MG PO TABS: 800.0000 mg | ORAL_TABLET | Freq: Three times a day (TID) | ORAL | 0 refills | Status: DC

## 2017-09-26 NOTE — ED Provider Notes (Signed)
[facility]    CSN: [id number] Arrival date & time: 09/26/17  9381     History   Chief Complaint Chief Complaint  Patient presents with  . Abscess    HPI Samantha [name] is a 42 y.o. female.   Samantha [name] presents with complaints of draining abscess to left axilla as well as right shoulder pain. She was seen in the ED for this on 1/12, declined incision at that time. States the abscess has been growing for weeks now, painful. She was started on doxycyline and has been taking. The abscess did open and start draining approximately 2 days ago. The pain has mildly improved, still with 8/10 pain. She states she feels generally unwell and with some nausea as well. Her right shoulder pain started without acute injury, worse with movement. Was started on prednisone for this and she feels it has improved the pain. No known MRSA history. No known fevers. Per chart review has a history of HS, patient states she did not know this.    ROS per HPI.       Past Medical History:  Diagnosis Date  . Back pain   . 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 Hyperlipidemia   . Medical history non-contributory   . TIA (transient ischemic attack) 04/2015   questionable, most likely complicated migraine    Patient Active Problem List   Diagnosis Date Noted  . Amenorrhea 09/17/2017  . Adjustment disorder with mixed anxiety and depressed mood 06/26/2016  . Depressed mood 06/26/2016  . Back pain 01/31/2016  . 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 Acute pericarditis 12/26/2015  . Hidradenitis suppurativa 11/07/2015  . 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 Sinusitis 10/20/2015  . Complicated migraine 82/99/3716  . Marijuana use 10/20/2015  . Smoker 05/04/2015  . TIA (transient ischemic attack) 05/04/2015  . Palpitations 05/04/2015  . Shortness of breath 05/04/2015  . Decreased appetite 05/04/2015  . Rash and nonspecific skin eruption 05/04/2015  . 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  Hyperlipidemia 04/22/2015  . TOA (tubo-ovarian abscess) 11/23/2012  . History of abnormal Pap smear 12/20/2011  . History of PID 12/20/2011    Past Surgical History:  Procedure Laterality Date  . LEEP      OB History    Gravida Para Term Preterm AB Living   2       1 1    SAB TAB Ectopic Multiple Live Births   1               Home Medications    Prior to Admission medications   Medication Sig Start Date End Date Taking? Authorizing Provider  doxycycline (VIBRAMYCIN) 100 MG capsule Take 1 capsule (100 mg total) by mouth 2 (two) times daily. One po bid x 7 days 09/21/17  Yes Delo, Nathaneil Canary, MD  HYDROcodone-acetaminophen (NORCO) 5-325 MG tablet Take 1-2 tablets by mouth every 6 (six) hours as needed. 09/21/17  Yes Delo, Nathaneil Canary, MD  predniSONE (DELTASONE) 10 MG tablet Take 2 tablets (20 mg total) by mouth 2 (two) times daily with a meal. 09/21/17  Yes Delo, Nathaneil Canary, MD  albuterol (PROVENTIL HFA;VENTOLIN HFA) 108 (90 Base) MCG/ACT inhaler Inhale 1-2 puffs into the lungs every 4 (four) hours as needed for wheezing or shortness of breath. Patient not taking: Reported on 09/21/2017 09/19/16 09/21/25  Mellody Dance, DO  ibuprofen (ADVIL,MOTRIN) 800 MG tablet Take 1 tablet (800 mg total) by mouth 3 (three) times daily. 09/26/17   Zigmund Gottron, NP  ondansetron (ZOFRAN) 4  MG tablet Take 1 tablet (4 mg total) by mouth every 8 (eight) hours as needed for nausea or vomiting. 09/26/17   Zigmund Gottron, NP    Family History Family History  Problem Relation Age of Onset  . Hypertension Mother   . Lupus Mother   . Diabetes Father   . Thyroid disease Sister   . Diabetes Sister   . Cancer Sister        stomach  . Diabetes Maternal Grandmother   . Anesthesia problems Neg Hx   . Other Neg Hx   . Migraines Neg Hx     Social History Social History   Tobacco Use  . Smoking status: Current Every Day Smoker    Packs/day: 1.00    Years: 17.00    Pack years: 17.00    Types: Cigarettes  .  Smokeless tobacco: Never Used  . Tobacco comment: 3 cigarettes a day   Substance Use Topics  . Alcohol use: Yes    Alcohol/week: 0.6 oz    Types: 1 Shots of liquor per week    Comment: drink on the weekends  . Drug use: Yes    Types: Marijuana     Allergies   Patient has no known allergies.   Review of Systems Review of Systems   Physical Exam Triage Vital Signs ED Triage Vitals  Enc Vitals Group     BP 09/26/17 1019 (!) 144/97     Pulse Rate 09/26/17 1019 64     Resp 09/26/17 1019 16     Temp 09/26/17 1019 98.1 F (36.7 C)     Temp Source 09/26/17 1019 Oral     SpO2 09/26/17 1019 98 %     Weight 09/26/17 1017 148 lb (67.1 kg)     Height 09/26/17 1017 5\' 4"  (1.626 m)     Head Circumference --      Peak Flow --      Pain Score 09/26/17 1017 8     Pain Loc --      Pain Edu? --      Excl. in [place]? --    No data found.  Updated Vital Signs BP (!) 144/97 (BP Location: Right Arm)   Pulse 64   Temp 98.1 F (36.7 C) (Oral)   Resp 16   Ht 5\' 4"  (1.626 m)   Wt 148 lb (67.1 kg)   LMP 06/26/2017   SpO2 98%   BMI 25.40 kg/m   Visual Acuity Right Eye Distance:   Left Eye Distance:   Bilateral Distance:    Right Eye Near:   Left Eye Near:    Bilateral Near:     Physical Exam  Constitutional: She is oriented to person, place, and time. She appears well-developed and well-nourished. No distress.  Cardiovascular: Normal rate, regular rhythm and normal heart sounds.  Pulmonary/Chest: Effort normal and breath sounds normal.  Abdominal: Soft.  Nausea in room  Neurological: She is alert and oriented to person, place, and time.  Skin: Skin is warm and dry.  Left axillary with approximately 3cm in length abscess with approximately 0.5cm in diameter opening with purulent drainage; without redness; tender; 2 other small soft probable abscesses to axilla noted; non tender      UC Treatments / Results  Labs (all labs ordered are listed, but only abnormal results are  displayed) Labs Reviewed  AEROBIC CULTURE (SUPERFICIAL SPECIMEN)    EKG  EKG Interpretation None       Radiology No results  found.  Procedures Procedures (including critical care time)  Medications Ordered in UC Medications  ondansetron (ZOFRAN-ODT) disintegrating tablet 4 mg (4 mg Oral Given 09/26/17 1037)     Initial Impression / Assessment and Plan / UC Course  I have reviewed the triage vital signs and the nursing notes.  Pertinent labs & imaging results that were available during my care of the patient were reviewed by me and considered in my medical decision making (see chart for details).     Abscess is largely open and drainage without need for further incision at this time. Doxycycline has been making patient nauseated, zofran provided. Culture obtained to ensure adequate coverage. Will notify of any positive findings and if any changes to treatment are needed.  Complete prednisone for right shoulder, rom exercises recommended. Recommended recheck of abscess with PCP in 3-5 days and possible follow up with general surgery as needed. Return precautions provided. Patient verbalized understanding and agreeable to plan.    Final Clinical Impressions(s) / UC Diagnoses   Final diagnoses:  Abscess of left axilla  Right shoulder pain, unspecified chronicity    ED Discharge Orders        Ordered    ondansetron (ZOFRAN) 4 MG tablet  Every 8 hours PRN     09/26/17 1035    ibuprofen (ADVIL,MOTRIN) 800 MG tablet  3 times daily     09/26/17 1035       Controlled Substance Prescriptions [place] Controlled Substance Registry consulted? Not Applicable   Zigmund Gottron, NP 09/26/17 1047

## 2017-09-26 NOTE — Discharge Instructions (Signed)
Keep wound open and draining, warm packs may help to promote further drainage. I have cultured the drainage to ensure adequate antibiotic coverage, we will call you if you need a change to treatment.  Complete course of doxycycline as well as prednisone as previously prescribed. Please call your primary care office to set up a follow up appointment for recheck early next week. Zofran as needed for nausea. Shoulder range of motion exercises provided. If develop worsening pain, nausea, abdominal pain, fevers, or otherwise worsening return to be see or go to Er.  Follow up with general surgery for further evaluation of abscess as needed

## 2017-09-26 NOTE — ED Triage Notes (Signed)
PT reports large abscess under left arm. PT reports recurrent abscesses. Abscess started draining a week ago, but has not healed yet per PT.

## 2017-09-29 LAB — AEROBIC CULTURE  (SUPERFICIAL SPECIMEN): CULTURE: NORMAL

## 2017-09-29 LAB — AEROBIC CULTURE W GRAM STAIN (SUPERFICIAL SPECIMEN)

## 2017-10-01 ENCOUNTER — Other Ambulatory Visit (HOSPITAL_COMMUNITY): Payer: Self-pay | Admitting: Obstetrics & Gynecology

## 2017-10-01 DIAGNOSIS — Z1231 Encounter for screening mammogram for malignant neoplasm of breast: Secondary | ICD-10-CM

## 2017-10-02 ENCOUNTER — Ambulatory Visit
Admission: RE | Admit: 2017-10-02 | Discharge: 2017-10-02 | Disposition: A | Discharge status: Home / Self Care | Payer: No Typology Code available for payment source | Source: Ambulatory Visit | Attending: Obstetrics & Gynecology | Admitting: Obstetrics & Gynecology

## 2017-10-02 ENCOUNTER — Encounter: Payer: Self-pay | Admitting: Radiology

## 2017-10-02 DIAGNOSIS — Z1231 Encounter for screening mammogram for malignant neoplasm of breast: Secondary | ICD-10-CM

## 2017-10-04 ENCOUNTER — Other Ambulatory Visit (HOSPITAL_COMMUNITY): Payer: Self-pay | Admitting: *Deleted

## 2017-10-04 DIAGNOSIS — R928 Other abnormal and inconclusive findings on diagnostic imaging of breast: Secondary | ICD-10-CM

## 2017-10-09 ENCOUNTER — Ambulatory Visit: Payer: No Typology Code available for payment source | Admitting: Family Medicine

## 2017-10-24 ENCOUNTER — Ambulatory Visit
Admission: RE | Admit: 2017-10-24 | Discharge: 2017-10-24 | Disposition: A | Discharge status: Home / Self Care | Payer: No Typology Code available for payment source | Source: Ambulatory Visit | Attending: Obstetrics and Gynecology | Admitting: Obstetrics and Gynecology

## 2017-10-24 ENCOUNTER — Ambulatory Visit (HOSPITAL_COMMUNITY)
Admission: RE | Admit: 2017-10-24 | Discharge: 2017-10-24 | Disposition: A | Discharge status: Home / Self Care | Payer: Self-pay | Source: Ambulatory Visit | Attending: Obstetrics and Gynecology | Admitting: Obstetrics and Gynecology

## 2017-10-24 ENCOUNTER — Encounter (HOSPITAL_COMMUNITY): Payer: Self-pay

## 2017-10-24 VITALS — BP 110/80 | Ht 64.0 in | Wt 148.4 lb

## 2017-10-24 DIAGNOSIS — R2232 Localized swelling, mass and lump, left upper limb: Secondary | ICD-10-CM

## 2017-10-24 DIAGNOSIS — R928 Other abnormal and inconclusive findings on diagnostic imaging of breast: Secondary | ICD-10-CM

## 2017-10-24 DIAGNOSIS — Z1239 Encounter for other screening for malignant neoplasm of breast: Secondary | ICD-10-CM

## 2017-10-24 NOTE — Progress Notes (Signed)
Patient referred to [facility] by the Breast Center due to recommending additional imaging of the left breast. Screening mammogram completed 10/02/2017.  Pap Smear: Pap smear not completed today. Last Pap smear was 09/18/2017 at the [facility] for [facility] [facility] and normal with negative HPV. Per patient has a history of an abnormal Pap smear 10-15 years ago that a LEEP was completed for follow-up. Per patient has had at least three normal Pap smears completed since LEEP. Last Pap smear result is in Epic.  Physical exam: Breasts Breasts symmetrical. No skin abnormalities right breast. Opened skin lesion observed within the left axilla. No nipple retraction bilateral breasts. No nipple discharge bilateral breasts. No lymphadenopathy. No lumps palpated right breast. Palpated multiple lumps within the left axilla. Complaints of pain when palpated left axillary lumps. Referred patient to the [facility] for a left breast diagnostic mammogram and breast ultrasound per recommendation. Appointment scheduled for Thursday, October 24, 2017 at 0920.       Pelvic/Bimanual No Pap smear completed today since last Pap smear and HPV typing was 09/18/2017. Pap smear not indicated per BCCCP guidelines.   Smoking History: Patient is a current smoker. Discussed smoking cessation with patient. Referred to the [facility] Quitline and gave resources to free smoking cessation classes at [facility].  Patient Navigation: Patient education provided. Access to services provided for patient through [place] [facility] program.

## 2017-10-24 NOTE — Patient Instructions (Addendum)
Explained breast self awareness with Samantha [name]. Patient did not need a Pap smear today due to last Pap smear and HPV typing was 09/18/2017. Let her know BCCCP will cover Pap smears and HPV typing every 5 years unless has a history of abnormal Pap smears. Referred patient to the [facility] for a left breast diagnostic mammogram and breast ultrasound per recommendation. Appointment scheduled for Thursday, October 24, 2017 at 0920. Discussed smoking cessation with patient. Referred to the [facility] Quitline and gave resources to free smoking cessation classes at [facility]. Samantha [name] verbalized understanding.  Samantha [name], Arvil Chaco, RN 9:16 AM

## 2017-10-25 ENCOUNTER — Encounter (HOSPITAL_COMMUNITY): Payer: Self-pay | Admitting: *Deleted

## 2017-11-12 ENCOUNTER — Ambulatory Visit (INDEPENDENT_AMBULATORY_CARE_PROVIDER_SITE_OTHER): Payer: Self-pay | Admitting: Family Medicine

## 2017-11-12 ENCOUNTER — Other Ambulatory Visit: Payer: Self-pay

## 2017-11-12 ENCOUNTER — Encounter: Payer: Self-pay | Admitting: Licensed Clinical Social Worker

## 2017-11-12 ENCOUNTER — Encounter: Payer: Self-pay | Admitting: Family Medicine

## 2017-11-12 VITALS — BP 142/78 | HR 85 | Temp 98.4°F | Ht 64.0 in | Wt 150.0 lb

## 2017-11-12 DIAGNOSIS — R4589 Other symptoms and signs involving emotional state: Secondary | ICD-10-CM

## 2017-11-12 DIAGNOSIS — F329 Major depressive disorder, single episode, unspecified: Secondary | ICD-10-CM

## 2017-11-12 DIAGNOSIS — L732 Hidradenitis suppurativa: Secondary | ICD-10-CM

## 2017-11-12 LAB — POCT URINE PREGNANCY: PREG TEST UR: NEGATIVE

## 2017-11-12 MED ORDER — ESCITALOPRAM OXALATE 10 MG PO TABS: 10.0000 mg | ORAL_TABLET | Freq: Every day | ORAL | 0 refills | Status: DC

## 2017-11-12 MED ORDER — DOXYCYCLINE HYCLATE 100 MG PO CAPS: 100.0000 mg | ORAL_CAPSULE | Freq: Two times a day (BID) | ORAL | 2 refills | Status: DC

## 2017-11-12 MED ORDER — NORGESTIMATE-ETH ESTRADIOL 0.25-35 MG-MCG PO TABS: 1.0000 | ORAL_TABLET | Freq: Every day | ORAL | 11 refills | Status: DC

## 2017-11-12 NOTE — Progress Notes (Signed)
ESTIMATE TIME:15 minutes Type of Service: [place] warm handoff  Interpreter:No.   Samantha [name] is a 42 y.o. female referred by Dr. Lyndal Pulley for: symptoms of  depression, positive on number 9 of PHQ-9 a well as life stressors.  Patient is calm and quite, not engaged in conversation but will answer questions that are asked Patient reports her main concern is pain, she feels that she is able to deal with life stressors. No hx of inpatient treatment or therapy. Hx of SSRI about a year ago, willing to take medication again.  Patient seen at [facility].  Duration of problem: on and off for a year. Appearance:Guarded ; Thought process: Coherent; Affect: Blunt : Suicidal ideation No plan to harm self or others indicated very unlikely that she would harm or kill self. Thinks of not wanting to be here when she has pain.  States she would not do anything to harm herself or act on thoughts.  LIFE /SOCIAL :  patient lives with 31 year old daughter ,support from family  Currently employed:No. last worked in Sept.   GOALS: Patient will reduce symptoms of: depression, and increase  ability HA:LPFXTK skills, Increase healthy adjustment to current life circumstances. INTERVENTION:  Supportive Counseling and Link to [employer], Reflective listening,  ISSUES DISCUSSED: Integrated care services, support system, previous and current coping skills, community resources( food, medicaid, orange card, and medication assistance program) , review of safety plan and phone number for mobile crisis .    ASSESSMENT:Patient currently experiencing symptoms of stress and depression.  Symptoms exacerbated by pain and chronic medical conditions. Patient may benefit from, and is in agreement to receive further assessment and brief therapeutic interventions to assist with managing her symptoms. Patient did not want to F/U with LCSW at [facility], wanted to F/U with Roselyn Reef at [facility]:   1.Patient will F/U with Roselyn Reef at [facility] 11/14/17  2. Referral:*Community Resources:  Food, Finances and Housing,  3. Patient will apply for food benefits and Medicaid     Warm Hand Off Completed.     Casimer Lanius, LCSW Licensed Clinical Social Worker [place]   [phone] 3:49 PM

## 2017-11-12 NOTE — Patient Instructions (Addendum)
  I'll see you back in 2-4 weeks If you have difficulty getting medications please call me If you have questions or concerns please do not hesitate to call at [phone].  Lucila Maine, DO PGY-2, [facility] 11/12/2017 3:27 PM  Please visit [email or web address]  There are support groups for hidradenitis in [place], MARCH 20 7:00 p.m. - 9:00 p.m.  First Floor Conference Room [facility] [address], [place] [address]  Our [employer] rotate between locations in [place] and [place].   Meetings are facilitated by the physicians, researchers, staff, and medical students of [place] ([place] location) and [employer] [facility] location).   Meetings begin at 7:00 p.m. and end at 9:00 p.m. Patients and caregivers are welcome no matter where (or if) you receive medical care.   Please RSVP to [email or web address].

## 2017-11-12 NOTE — Progress Notes (Signed)
Subjective:    Patient ID: Samantha [name], female    DOB: 1976/02/03, 42 y.o.   MRN: [id number]   CC: establish care  PMH- hidradenitis suppurtiva, stroke/TIA in past, migraines, Meds- none Allergies- none Surg Hx- none FH- no FH of HS, sisters mother and children in good health [place]- unemployed since September when she lost her job 2/2 medical complications from [employer], lives with her daughter. Smokes black and milds. Social alcohol use. Uses marijuana, denies other drug use  HS- has suffered from HS since high school age. She is severely depressed due to this medical condition. She feels embarrassed by the odor from draining wounds under her arms and in groin. She reports feeling unable to have an intimate relationship with anyone. She has close support in sisters, mother but sometimes feels "they can't understand what I'm going through". She had a job she enjoyed but had to leave it in September due to missing many days of work. She then lost health insurance and has not been able to follow w/ dermatology for her HS. She uses hibiclens in shower. Used to use courses of doxy for outbreaks but no longer has this medication. Never been to surgery for HS. She is not overweight. She does smoke.   She endorses SI. Her plan would be overdose on pills or cut her wrists. She states she does not think pills would be effective enough and she is too scared to cut her wrists. She lists several reasons to keep living including her mother, sisters, and daughter. She denies intent to go through with this plan. She is open to counseling and medication to help with depression. She is unaware of HS support groups existing.   Smoking status reviewed- current smoker  Review of Systems- no fevers, chills, focal weakness, CP, SOB   Objective:  BP (!) 142/78   Pulse 85   Temp 98.4 F (36.9 C) (Oral)   Ht 5\' 4"  (1.626 m)   Wt 150 lb (68 kg)   LMP 10/14/2017 (Exact Date)   SpO2 98%   BMI 25.75 kg/m  Vitals and  nursing note reviewed  General: well nourished, in no acute distress HEENT: normocephalic, moist mucous membranes Neck: supple, non-tender, without lymphadenopathy Cardiac: RRR, clear S1 and S2, no murmurs, rubs, or gallops Respiratory: clear to auscultation bilaterally, no increased work of breathing Abdomen: soft, nontender, nondistended, no masses or organomegaly. Bowel sounds present Extremities: no edema or cyanosis.  Skin: warm and dry. Several boils present in axilla with extensive scarring. No active drainage. Tender to touch.  Neuro: alert and oriented, no focal deficits Psych: mood is sad, affect congruent. She is tearful during interview   Assessment & Plan:   1. Hidradenitis suppurativa Information given to patient about support group in [place], [place] Will resume doxycycline for suppresion/inflammation control  Add OCP to add hormonal control to see if this helps decrease lesions- upreg negative Follow up 2-4 weeks to see how things are going Patient without insurance which will limit surgical options however she is in [place] stage 2 at least of her disease, would benefit from surgical intervention Discussed intralesional steroids in the future to help Could consider short courses of prednisone for flares in future, would not want to keep her on chronic steroids - POCT urine pregnancy  2. Depressed mood She relays SI with plan, no intent Largely related to chronic disease Encouraged patient to attend HS support groups and investigate further support online Warm handoff to [facility]  today, please see Ronney Asters note for further documentation To follow up with CSW at [facility] Will start patient on 10 mg lexapro Follow up 2-4 weeks   Return in about 4 weeks (around 12/10/2017).   Lucila Maine, DO Family Medicine Resident PGY-2

## 2017-11-14 ENCOUNTER — Ambulatory Visit: Payer: Self-pay

## 2018-02-20 ENCOUNTER — Ambulatory Visit: Payer: Self-pay

## 2018-03-20 ENCOUNTER — Emergency Department (HOSPITAL_COMMUNITY)
Admission: EM | Admit: 2018-03-20 | Discharge: 2018-03-20 | Disposition: A | Discharge status: Home / Self Care | Payer: Self-pay | Attending: Emergency Medicine | Admitting: Emergency Medicine

## 2018-03-20 ENCOUNTER — Encounter (HOSPITAL_COMMUNITY): Payer: Self-pay | Admitting: Emergency Medicine

## 2018-03-20 ENCOUNTER — Other Ambulatory Visit: Payer: Self-pay

## 2018-03-20 ENCOUNTER — Emergency Department (HOSPITAL_COMMUNITY): Payer: Self-pay

## 2018-03-20 DIAGNOSIS — R112 Nausea with vomiting, unspecified: Secondary | ICD-10-CM | POA: Insufficient documentation

## 2018-03-20 DIAGNOSIS — R002 Palpitations: Secondary | ICD-10-CM | POA: Insufficient documentation

## 2018-03-20 DIAGNOSIS — R0602 Shortness of breath: Secondary | ICD-10-CM | POA: Insufficient documentation

## 2018-03-20 LAB — COMPREHENSIVE METABOLIC PANEL
ALBUMIN: 3.7 g/dL (ref 3.5–5.0)
ALK PHOS: 68 U/L (ref 38–126)
ALT: 7 U/L (ref 0–44)
AST: 18 U/L (ref 15–41)
Anion gap: 8 (ref 5–15)
BILIRUBIN TOTAL: 0.7 mg/dL (ref 0.3–1.2)
BUN: 11 mg/dL (ref 6–20)
CALCIUM: 9.2 mg/dL (ref 8.9–10.3)
CO2: 27 mmol/L (ref 22–32)
CREATININE: 0.78 mg/dL (ref 0.44–1.00)
Chloride: 104 mmol/L (ref 98–111)
GFR calc Af Amer: >60 mL/min (ref >60)
GLUCOSE: 128 mg/dL — AB (ref 70–99)
POTASSIUM: 3.4 mmol/L — AB (ref 3.5–5.1)
Sodium: 139 mmol/L (ref 135–145)
TOTAL PROTEIN: 7.2 g/dL (ref 6.5–8.1)

## 2018-03-20 LAB — CBC
HEMATOCRIT: 41.7 % (ref 36.0–46.0)
Hemoglobin: 13.9 g/dL (ref 12.0–15.0)
MCH: 30.5 pg (ref 26.0–34.0)
MCHC: 33.3 g/dL (ref 30.0–36.0)
MCV: 91.6 fL (ref 78.0–100.0)
PLATELETS: 263 10*3/uL (ref 150–400)
RBC: 4.55 MIL/uL (ref 3.87–5.11)
RDW: 13 % (ref 11.5–15.5)
WBC: 6.9 10*3/uL (ref 4.0–10.5)

## 2018-03-20 LAB — URINALYSIS, ROUTINE W REFLEX MICROSCOPIC
Bacteria, UA: NONE SEEN
Bilirubin Urine: NEGATIVE
GLUCOSE, UA: NEGATIVE mg/dL
Ketones, ur: NEGATIVE mg/dL
Leukocytes, UA: NEGATIVE
NITRITE: NEGATIVE
PH: 6 (ref 5.0–8.0)
Protein, ur: NEGATIVE mg/dL
SPECIFIC GRAVITY, URINE: 1.016 (ref 1.005–1.030)

## 2018-03-20 LAB — I-STAT TROPONIN, ED: Troponin i, poc: 0 ng/mL (ref 0.00–0.08)

## 2018-03-20 LAB — BRAIN NATRIURETIC PEPTIDE: B NATRIURETIC PEPTIDE 5: 74.8 pg/mL (ref 0.0–100.0)

## 2018-03-20 LAB — LIPASE, BLOOD: Lipase: 30 U/L (ref 11–51)

## 2018-03-20 LAB — I-STAT BETA HCG BLOOD, ED (MC, WL, AP ONLY)

## 2018-03-20 LAB — D-DIMER, QUANTITATIVE (NOT AT ARMC)

## 2018-03-20 MED ORDER — PREDNISONE 20 MG PO TABS
60.0000 mg | ORAL_TABLET | Freq: Once | ORAL | Status: AC
  Administered 2018-03-20: 60 mg via ORAL
  Filled 2018-03-20: qty 3

## 2018-03-20 MED ORDER — IPRATROPIUM-ALBUTEROL 0.5-2.5 (3) MG/3ML IN SOLN
3.0000 mL | Freq: Once | RESPIRATORY_TRACT | Status: AC
  Administered 2018-03-20: 3 mL via RESPIRATORY_TRACT
  Filled 2018-03-20: qty 3

## 2018-03-20 MED ORDER — ALBUTEROL SULFATE HFA 108 (90 BASE) MCG/ACT IN AERS
1.0000 | INHALATION_SPRAY | Freq: Once | RESPIRATORY_TRACT | Status: AC
  Administered 2018-03-20: 1 via RESPIRATORY_TRACT
  Filled 2018-03-20: qty 6.7

## 2018-03-20 MED ORDER — PREDNISONE 20 MG PO TABS: 40.0000 mg | ORAL_TABLET | Freq: Every day | ORAL | 0 refills | Status: AC

## 2018-03-20 MED ORDER — ACETAMINOPHEN 325 MG PO TABS
650.0000 mg | ORAL_TABLET | Freq: Once | ORAL | Status: AC
  Administered 2018-03-20: 650 mg via ORAL
  Filled 2018-03-20: qty 2

## 2018-03-20 MED ORDER — HYDROXYZINE HCL 25 MG PO TABS
25.0000 mg | ORAL_TABLET | Freq: Once | ORAL | Status: AC
  Administered 2018-03-20: 25 mg via ORAL
  Filled 2018-03-20: qty 1

## 2018-03-20 MED ORDER — HYDROXYZINE HCL 25 MG PO TABS
25.0000 mg | ORAL_TABLET | Freq: Four times a day (QID) | ORAL | 0 refills | Status: DC
Start: 2018-03-20 — End: 2018-10-05

## 2018-03-20 MED ORDER — DOXYCYCLINE HYCLATE 100 MG PO CAPS: 100.0000 mg | ORAL_CAPSULE | Freq: Two times a day (BID) | ORAL | 0 refills | Status: AC

## 2018-03-20 NOTE — ED Notes (Signed)
Patient able to maintain 97-100% while ambulating with EDP. HR maintained as well at 70-72.

## 2018-03-20 NOTE — ED Provider Notes (Signed)
Physical Exam  BP (!) 114/95   Pulse 61   Temp 98.4 F (36.9 C) (Oral)   Resp 20   LMP 03/20/2018   SpO2 97%   Assumed care from [employer], PA-C at 4:40 PM. Briefly, the patient is a 42 y.o. 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 here with multiple complaints, including shortness of breath for "months" and nausea and vomiting for "months." See PA Couture's note for full H & P.  On my assessment, patient reporting significant improvement after 1 DuoNeb, and only having minimal shortness of breath with activity.  Patient reports this is not new.  Patient reports that she has not been to her primary care provider in a couple months.  Labs Reviewed  COMPREHENSIVE METABOLIC PANEL - Abnormal; Notable for the following components:      Result Value   Potassium 3.4 (*)    Glucose, Bld 128 (*)    All other components within normal limits  URINALYSIS, ROUTINE W REFLEX MICROSCOPIC - Abnormal; Notable for the following components:   Hgb urine dipstick LARGE (*)    All other components within normal limits  LIPASE, BLOOD  CBC  BRAIN NATRIURETIC PEPTIDE  D-DIMER, QUANTITATIVE (NOT AT [facility])  I-STAT BETA HCG BLOOD, ED ([facility], [facility], [facility] ONLY)  I-STAT TROPONIN, ED    Course of Care:   Physical Exam  Constitutional: She appears well-developed and well-nourished. No distress.  Sitting comfortably in bed.  HENT:  Head: Normocephalic and atraumatic.  Eyes: Conjunctivae are normal. Right eye exhibits no discharge. Left eye exhibits no discharge.  EOMs normal to gross examination.  Neck: Normal range of motion.  Cardiovascular: Normal rate and regular rhythm.  Intact, 2+ radial pulse.  Pulmonary/Chest: Effort normal. She has wheezes.  Normal respiratory effort.  Wheezes in left upper lung field.  Abdominal: She exhibits no distension.   Musculoskeletal: Normal range of motion.  Neurological: She is alert.  Cranial nerves intact to gross observation. Patient moves extremities without difficulty.  Skin: Skin is warm and dry. She is not diaphoretic.  Psychiatric: She has a normal mood and affect. Her behavior is normal. Judgment and thought content normal.  Nursing note and vitals reviewed.   ED Course/Procedures   Clinical Course as of Mar 21 156  Thu Mar 20, 2018  1843 Personally ambulated patient.  Patient had 97 to 100% on room air.  No tachycardia.   [AM]    Clinical Course User Index [AM] Albesa Seen, PA-C    Procedures  MDM  Patient nontoxic-appearing and in no acute distress.  Patient had a comprehensive work-up shortness of breath and nausea and emesis troponin is negative.  EKG with no significant change since previous tracing.  D-dimer is negative.  BNP is not elevated.  Patient was ambulated and pulse oximetry was stable.  Patient had minimal shortness of breath.  Orthostatic vital signs reveal postural  tachycardia.  Patient has previously worn a Holter monitor.  Will refer back to cardiology.   Patient had clinical improvement after 2 DuoNeb's.  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  No history of asthma, but patient does report she has been treated with inhalers before.  Patient is referred back to her primary care provider.   Patient given doxycycline, prednisone, hydroxyzine, and inhaler per previous provider.  Patient instructed return the emergency department for any chest pain, shortness of  breath, or intractable nausea vomiting.  Patient is in understanding and agrees with plan of care.   Albesa Seen, PA-C 03/21/18 0200    Duffy Bruce, MD 03/21/18 1105

## 2018-03-20 NOTE — Discharge Instructions (Addendum)
Please take the medications as instructed on your discharge paperwork.   Please follow up with your primary care provider within 5-7 days for re-evaluation of your symptoms. If you do not have a primary care provider, information for a healthcare clinic has been provided for you to make arrangements for follow up care. Please return to the emergency department for any new or worsening symptoms.  I placed a referral to cardiology.  They should be calling you.  Please also call their office to make an appointment.  This is for the palpitations you experience when you stand.   Doxycycline is an antibiotic that fights infection in the lungs. This medication can make your skin sensitive to the sun, so please ensure that you wear sunscreen, hats, or other coverage over your skin while taking this. This medicine CANNOT be taken by women while pregnant, breastfeeding, or trying to become pregnant.  Please speak with a healthcare provider if any of these situations apply to you.  You are prescribed prednisone, a steroid. This is a medication to help reduce inflammation in the lungs.  Common side effects include upset stomach/nausea. You may take this medicine with food if this occurs. Other side effects include restlessness, difficulty sleeping, and increased sweating. Call your healthcare provider if these do not resolve after finishing the medication.  This medicine may increase your blood sugar so additional careful monitoring is needed of blood sugar if you have diabetes. Call your healthcare provider for any signs/symtpoms of high blood sugar such as confusion, feeling sleepy, more thirst, more hunger, passing urine more often, flushing, fast breathing, or breath that smells like fruit.  You are prescribed hydroxyzine.  This is a medication to help with anxiety, and it also has some antinausea effects to it.  This medication, like Benadryl, can make you sleepy, so please do not drive, drink alcohol, or  operate machinery while taking this medication.  You are prescribed an albuterol inhaler.  Please take 1 puff every 6 hours as needed for wheezing.  If you require the inhaler more than 3 times, spaced 20 minutes apart because of acute shortness of breath, please come to the emergency department.  Please return the emergency department for any chest pain, shortness of breath, feel you are going to pass out, or nausea or vomiting that prevents you from keeping anything down.

## 2018-03-20 NOTE — ED Provider Notes (Signed)
[facility] DEPT Provider Note   CSN: [id number] Arrival date & time: 03/20/18  1413     History   Chief Complaint Chief Complaint  Patient presents with  . Emesis  . Nausea    HPI Samantha [name] is a 42 y.o. female.  HPI   Pt is a 42 y/o female with a h/o TIA? Vs complicated migraine, HLD, basilar migraine, and chronic back pain who presents to the ED today with multiple complaints.   Patient states that for the last several years she has had nausea, shortness of breath at rest and with ambulation, generalized fatigue, palpitations, lightheadedness upon standing. Pt states that she was at the hospital PTA visiting her dad who was just in the emergency department and was admitted to the ICU. While upstairs visiting she states that her mother told her to come to the ED to get checked out for these symptoms. Pt states that her shortness of breath seems somewhat worse today and she has had a dry cough starting today. She denies any chest pain. No headaches, vertigo, vision changes, numbness/weakness though she feels like she is about to get a migraine.  She denies vomiting, abd pain, diarrhea, constipation, urinary sxs, or fevers. No blood in stool.   Denies leg pain/swelling, hemoptysis, recent surgery/trauma, recent long travel, hormone use, personal hx of cancer, or hx of DVT/PE.   Patient notes that she has been worked up by her primary care doctor for her palpitations.  She has worn a Holter monitor in the past, states work-up was negative.  Notes that she has a history of panic attacks and states that she has been having them more frequently recently.  She denies that she is taking anything for anxiety or depression however in her chart it appears she has been given a prescription for Lexapro in the past.  Past Medical History:  Diagnosis Date  . Back pain   . 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 Hyperlipidemia   . Medical history non-contributory   . TIA  (transient ischemic attack) 04/2015   questionable, most likely complicated migraine    Patient Active Problem List   Diagnosis Date Noted  . Amenorrhea 09/17/2017  . Adjustment disorder with mixed anxiety and depressed mood 06/26/2016  . Depressed mood 06/26/2016  . Back pain 01/31/2016  . 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 Acute pericarditis 12/26/2015  . Hidradenitis suppurativa 11/07/2015  . 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 Sinusitis 10/20/2015  . Complicated migraine 28/41/3244  . Marijuana use 10/20/2015  . Smoker 05/04/2015  . TIA (transient ischemic attack) 05/04/2015  . Palpitations 05/04/2015  . Decreased appetite 05/04/2015  . 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 Hyperlipidemia 04/22/2015  . TOA (tubo-ovarian abscess) 11/23/2012  . History of abnormal Pap smear 12/20/2011  . History of PID 12/20/2011    Past Surgical History:  Procedure Laterality Date  . CERVICAL BIOPSY  W/ LOOP ELECTRODE EXCISION    . LEEP       OB History    Gravida  2   Para      Term      Preterm      AB  1   Living  1     SAB  1   TAB      Ectopic      Multiple      Live Births               Home Medications    Prior  to Admission medications   Medication Sig Start Date End Date Taking? Authorizing Provider  ibuprofen (ADVIL,MOTRIN) 200 MG tablet Take 200-400 mg by mouth every 6 (six) hours as needed for moderate pain.   Yes [provider]  doxycycline (VIBRAMYCIN) 100 MG capsule Take 1 capsule (100 mg total) by mouth 2 (two) times daily for 7 days. 03/20/18 03/27/18  Kashonda Sarkisyan S, PA-C  escitalopram (LEXAPRO) 10 MG tablet Take 1 tablet (10 mg total) by mouth daily. Patient not taking: Reported on 03/20/2018 11/12/17   Steve Rattler, DO  hydrOXYzine (ATARAX/VISTARIL) 25 MG tablet Take 1 tablet (25 mg total) by mouth every 6 (six) hours. 03/20/18   Takeyah Wieman S, PA-C  norgestimate-ethinyl estradiol (PREVIFEM) 0.25-35 MG-MCG tablet  Take 1 tablet by mouth daily. Patient not taking: Reported on 03/20/2018 11/12/17   Steve Rattler, DO  predniSONE (DELTASONE) 20 MG tablet Take 2 tablets (40 mg total) by mouth daily for 5 days. 03/20/18 03/25/18  Miles Leyda S, PA-C    Family History Family History  Problem Relation Age of Onset  . Hypertension Mother   . Lupus Mother   . Diabetes Father   . Thyroid disease Sister   . Diabetes Sister   . Cancer Sister        stomach  . Diabetes Maternal Grandmother   . Anesthesia problems Neg Hx   . Other Neg Hx   . Migraines Neg Hx     Social History Social History   Tobacco Use  . Smoking status: Current Every Day Smoker    Packs/day: 1.00    Years: 17.00    Pack years: 17.00    Types: Cigarettes  . Smokeless tobacco: Never Used  . Tobacco comment: 3 cigarettes a day   Substance Use Topics  . Alcohol use: Yes    Alcohol/week: 0.6 oz    Types: 1 Shots of liquor per week    Comment: drink on the weekends  . Drug use: Yes    Frequency: 3.0 times per week    Types: Marijuana     Allergies   Patient has no known allergies.   Review of Systems Review of Systems  Constitutional: Negative for fever.  HENT: Negative for congestion, ear pain, rhinorrhea and sore throat.   Eyes: Negative for visual disturbance.  Respiratory: Positive for cough and shortness of breath. Negative for wheezing.   Cardiovascular: Positive for palpitations. Negative for chest pain and leg swelling.  Gastrointestinal: Positive for nausea. Negative for abdominal pain, blood in stool, constipation, diarrhea and vomiting.  Genitourinary: Negative for dysuria, flank pain, frequency, hematuria and urgency.  Musculoskeletal: Negative for back pain.  Skin: Negative for wound.  Neurological: Positive for light-headedness. Negative for dizziness, weakness, numbness and headaches.   Physical Exam Updated Vital Signs BP (!) 114/95   Pulse 61   Temp 98.4 F (36.9 C) (Oral)   Resp 20   LMP  03/20/2018   SpO2 97%   Physical Exam  Constitutional: She is oriented to person, place, and time. She appears well-developed and well-nourished. No distress.  HENT:  Head: Normocephalic and atraumatic.  Mouth/Throat: Oropharynx is clear and moist.  Eyes: Pupils are equal, round, and reactive to light. Conjunctivae and EOM are normal.  No nystagmus  Neck: Neck supple. No JVD present.  Cardiovascular: Normal rate, regular rhythm, normal heart sounds and intact distal pulses.  No murmur heard. Pulmonary/Chest: No respiratory distress.  Hyperventilating. satting at 100% on RA. Speaking in full sentences.  Wheezing throughout bilateral lung fields. Intermittent ronchi. Wet cough.   Abdominal: Soft. Bowel sounds are normal. She exhibits no distension. There is no tenderness. There is no guarding.  Neurological: She is alert and oriented to person, place, and time.  Skin: Skin is warm and dry.  Psychiatric:  Pt appears very anxious  Nursing note and vitals reviewed.  ED Treatments / Results  Labs (all labs ordered are listed, but only abnormal results are displayed) Labs Reviewed  COMPREHENSIVE METABOLIC PANEL - Abnormal; Notable for the following components:      Result Value   Potassium 3.4 (*)    Glucose, Bld 128 (*)    All other components within normal limits  LIPASE, BLOOD  CBC  BRAIN NATRIURETIC PEPTIDE  D-DIMER, QUANTITATIVE (NOT AT [facility])  URINALYSIS, ROUTINE W REFLEX MICROSCOPIC  D-DIMER, QUANTITATIVE (NOT AT [facility])  BRAIN NATRIURETIC PEPTIDE  I-STAT BETA HCG BLOOD, ED ([facility], [facility], [facility] ONLY)  I-STAT TROPONIN, ED    EKG EKG Interpretation  Date/Time:  Thursday March 20 2018 15:48:50 EDT Ventricular Rate:  77 PR Interval:    QRS Duration: 90 QT Interval:  424 QTC Calculation: 480 R Axis:   -22 Text Interpretation:  Sinus rhythm Ventricular premature complex Borderline left axis deviation Low voltage, extremity and precordial leads RSR' in V1 or V2, probably normal variant  Baseline wander in lead(s) II III aVF No significant change since last tracing Confirmed by Duffy Bruce [phone]) on 03/20/2018 3:52:13 PM Also confirmed by Milton Ferguson [phone])  on 03/20/2018 3:56:36 PM   Radiology Dg Chest 2 View  Result Date: 03/20/2018 CLINICAL DATA:  42 year old female with a history of nausea and vomiting EXAM: CHEST - 2 VIEW COMPARISON:  12/07/2015 FINDINGS: Cardiomediastinal silhouette unchanged in size and contour. No evidence of central vascular congestion. No pneumothorax or pleural effusion. No confluent airspace disease. Coarsened interstitial markings, similar to the prior. Metallic shrapnel projects over the left hemithorax on the left upper abdomen. No displaced fracture IMPRESSION: Chronic lung changes without evidence of acute cardiopulmonary disease Electronically Signed   By: Corrie Mckusick D.O.   On: 03/20/2018 15:48    Procedures Procedures (including critical care time)  Medications Ordered in ED Medications  ipratropium-albuterol (DUONEB) 0.5-2.5 (3) MG/3ML nebulizer solution 3 mL (has no administration in time range)  predniSONE (DELTASONE) tablet 60 mg (has no administration in time range)  albuterol (PROVENTIL HFA;VENTOLIN HFA) 108 (90 Base) MCG/ACT inhaler 1 puff (has no administration in time range)  ipratropium-albuterol (DUONEB) 0.5-2.5 (3) MG/3ML nebulizer solution 3 mL (3 mLs Nebulization Given 03/20/18 1518)  hydrOXYzine (ATARAX/VISTARIL) tablet 25 mg (25 mg Oral Given 03/20/18 1543)  acetaminophen (TYLENOL) tablet 650 mg (650 mg Oral Given 03/20/18 1543)     Initial Impression / Assessment and Plan / ED Course  I have reviewed the triage vital signs and the nursing notes.  Pertinent labs & imaging results that were available during my care of the patient were reviewed by me and considered in my medical decision making (see chart for details).   3:31 PM Re-evaluated pt. She just finished her nebulizer treatment. She states that she does not  feel somewhat improved after the nebulizer tx. Repeat pulmonary exam with rhonchi on the right. Wheezing has improved. Xray tech in to take pt to CXR. Pt satting at 99% on RA.    Final Clinical Impressions(s) / ED Diagnoses   Final diagnoses:  Shortness of breath   Patient presenting with multiple complaints that have seemed to be  chronic for her however her shortness of breath seem to be her biggest concern today. States that her SOB has worsened recently and feels more severe today.  Has SOB at rest and with exertion. Has a h/o panic attacks that have caused SOB in the past and she initially stated that her sxs seemed similar. She was very anxious on exam and therefore given hydroxyzine. She also initially had wheezing throughout all lung fields, which improved after a DuoNeb.  She has a long history of tobacco use and has smoked 6 black and milds per day for the last 10 years at least.  She also intermittently smokes marijuana and vapes.  Concern for undiagnosed COPD. suspect she may be having an exacerbation of this today versus bronchitis.  She denies any chest pain.  Her EKG is at baseline.  Her chest x-ray shows no pneumonia.  Her troponin is negative.  Her BNP and d-dimer are both negative.  Doubt PE.  Lower suspicion for ACS given chronicity of her symptoms and negative troponin. Remainder of labs are benign.  Pt care signed out to [facility], PA-C at shift change. Currently pending UA and orthostatic vital signs. Plan is to follow up on UA and orthostatics. Repeat duo neb and ambulate patient with pulse ox. If patient can maintain sats and is not tachypneic, then she is likely safe for discharge with rx for abx, steroid burst, inhaler and hydroxyzine with close PCP f/u. She may benefit from cardiology f/u as well for her persistent palpitations. If she is not significantly improved or her sats drop with ambulation then she may require admission.    ED Discharge Orders        Ordered     predniSONE (DELTASONE) 20 MG tablet  Daily     03/20/18 1625    doxycycline (VIBRAMYCIN) 100 MG capsule  2 times daily     03/20/18 1625    hydrOXYzine (ATARAX/VISTARIL) 25 MG tablet  Every 6 hours     03/20/18 1625       Anokhi Shannon S, PA-C 03/20/18 1721    Duffy Bruce, MD 03/21/18 1057

## 2018-03-20 NOTE — ED Notes (Signed)
Pt ambulated to the bathroom and back.  By the time we reached the bathroom the pt was breathing hard and said she was out of breath.  When she walked back from the bathroom she was still breathing hard and seemed to be out of breath.  Her O2 went down to 89% but quickly went back up to 97%.

## 2018-03-20 NOTE — ED Triage Notes (Signed)
Pt verbalizes n/v for months; denies pain.

## 2018-04-24 ENCOUNTER — Ambulatory Visit (HOSPITAL_COMMUNITY)
Admission: EM | Admit: 2018-04-24 | Discharge: 2018-04-24 | Disposition: A | Discharge status: Home / Self Care | Payer: Self-pay | Attending: Family Medicine | Admitting: Family Medicine

## 2018-04-24 ENCOUNTER — Encounter (HOSPITAL_COMMUNITY): Payer: Self-pay

## 2018-04-24 ENCOUNTER — Other Ambulatory Visit: Payer: Self-pay

## 2018-04-24 DIAGNOSIS — L732 Hidradenitis suppurativa: Secondary | ICD-10-CM

## 2018-04-24 DIAGNOSIS — L02412 Cutaneous abscess of left axilla: Secondary | ICD-10-CM

## 2018-04-24 MED ORDER — PREDNISONE 10 MG (21) PO TBPK: ORAL_TABLET | ORAL | 0 refills | Status: DC

## 2018-04-24 MED ORDER — DOXYCYCLINE HYCLATE 100 MG PO CAPS: 100.0000 mg | ORAL_CAPSULE | Freq: Two times a day (BID) | ORAL | 0 refills | Status: DC

## 2018-04-24 NOTE — ED Provider Notes (Signed)
[facility]    CSN: [id number] Arrival date & time: 04/24/18  3716     History   Chief Complaint Chief Complaint  Patient presents with  . Recurrent Skin Infections    HPI Samantha [name] is a 42 y.o. female.   Patient is a 42 year old female past medical history of hidradenitis, depressed mood and anxiety.  She presents with flareup of her hidradenitis.  She has multiple areas under left axilla and one on the right cheek area.  They both have been there for a few weeks.  She denies any drainage from the area.  They have not gotten any better but have remained the same.  She has been using warm compresses and taking Advil.  This helps with the relief of her pain.  She has been having some chills, body aches but no fever.  She is very upset because she is having to miss work for this and has been depressed mood.  Denies any SI today.  Reports she has not taken her Lexapro like she is supposed to.   ROS per HPI      Past Medical History:  Diagnosis Date  . Back pain   . 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 Hyperlipidemia   . Medical history non-contributory   . TIA (transient ischemic attack) 04/2015   questionable, most likely complicated migraine    Patient Active Problem List   Diagnosis Date Noted  . Amenorrhea 09/17/2017  . Adjustment disorder with mixed anxiety and depressed mood 06/26/2016  . Depressed mood 06/26/2016  . Back pain 01/31/2016  . 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 Acute pericarditis 12/26/2015  . Hidradenitis suppurativa 11/07/2015  . 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 Sinusitis 10/20/2015  . Complicated migraine [id number]/[id number]  . Marijuana use 10/20/2015  . Smoker 05/04/2015  . TIA (transient ischemic attack) 05/04/2015  . Palpitations 05/04/2015  . Decreased appetite 05/04/2015  . 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 Hyperlipidemia 04/22/2015  . TOA (tubo-ovarian abscess) 11/23/2012  . History of abnormal Pap smear 12/20/2011  .  History of PID 12/20/2011    Past Surgical History:  Procedure Laterality Date  . CERVICAL BIOPSY  W/ LOOP ELECTRODE EXCISION    . LEEP      OB History    Gravida  2   Para      Term      Preterm      AB  1   Living  1     SAB  1   TAB      Ectopic      Multiple      Live Births               Home Medications    Prior to Admission medications   Medication Sig Start Date End Date Taking? Authorizing Provider  doxycycline (VIBRAMYCIN) 100 MG capsule Take 1 capsule (100 mg total) by mouth 2 (two) times daily. 04/24/18   Trygg Mantz, Tressia Miners A, NP  escitalopram (LEXAPRO) 10 MG tablet Take 1 tablet (10 mg total) by mouth daily. Patient not taking: Reported on 03/20/2018 11/12/17   Steve Rattler, DO  hydrOXYzine (ATARAX/VISTARIL) 25 MG tablet Take 1 tablet (25 mg total) by mouth every 6 (six) hours. Patient not taking: Reported on 04/24/2018 03/20/18   Couture, Cortni S, PA-C  ibuprofen (ADVIL,MOTRIN) 200 MG tablet Take 200-400 mg by mouth every 6 (six) hours as needed for moderate pain.    [provider]  norgestimate-ethinyl estradiol (PREVIFEM)  0.25-35 MG-MCG tablet Take 1 tablet by mouth daily. Patient not taking: Reported on 03/20/2018 11/12/17   Steve Rattler, DO  predniSONE (STERAPRED UNI-PAK 21 TAB) 10 MG (21) TBPK tablet 6 tabs for 1 day, then 5 tabs for 1 das, then 4 tabs for 1 day, then 3 tabs for 1 day, 2 tabs for 1 day, then 1 tab for 1 day 04/24/18   Orvan July, NP    Family History Family History  Problem Relation Age of Onset  . Hypertension Mother   . Lupus Mother   . Diabetes Father   . Thyroid disease Sister   . Diabetes Sister   . Cancer Sister        stomach  . Diabetes Maternal Grandmother   . Anesthesia problems Neg Hx   . Other Neg Hx   . Migraines Neg Hx     Social History Social History   Tobacco Use  . Smoking status: Current Every Day Smoker    Packs/day: 1.00    Years: 17.00    Pack years: 17.00    Types: Cigarettes    . Smokeless tobacco: Never Used  . Tobacco comment: 3 cigarettes a day   Substance Use Topics  . Alcohol use: Yes    Alcohol/week: 1.0 standard drinks    Types: 1 Shots of liquor per week    Comment: drink on the weekends  . Drug use: Yes    Frequency: 3.0 times per week    Types: Marijuana     Allergies   Patient has no known allergies.   Review of Systems Review of Systems   Physical Exam Triage Vital Signs ED Triage Vitals  Enc Vitals Group     BP 04/24/18 1003 140/86     Pulse Rate 04/24/18 1003 66     Resp 04/24/18 1003 16     Temp 04/24/18 1003 98.9 F (37.2 C)     Temp Source 04/24/18 1003 Oral     SpO2 04/24/18 1003 99 %     Weight 04/24/18 1005 154 lb (69.9 kg)     Height --      Head Circumference --      Peak Flow --      Pain Score 04/24/18 1005 4     Pain Loc --      Pain Edu? --      Excl. in [place]? --    No data found.  Updated Vital Signs BP 140/86   Pulse 66   Temp 98.9 F (37.2 C) (Oral)   Resp 16   Wt 154 lb (69.9 kg)   LMP 04/24/2018   SpO2 99%   BMI 26.43 kg/m   Visual Acuity Right Eye Distance:   Left Eye Distance:   Bilateral Distance:    Right Eye Near:   Left Eye Near:    Bilateral Near:     Physical Exam  Constitutional: She is oriented to person, place, and time. She appears well-developed and well-nourished.  HENT:  Head: Normocephalic and atraumatic.  Neck: Normal range of motion.  Pulmonary/Chest: Effort normal.  Neurological: She is alert and oriented to person, place, and time.  Skin: Skin is warm.  Multiple nonfluctuant small, firm abscesses under her left axilla.  Nonfluctuant abscess to right cheek area. No drainage, erythema.   Psychiatric: She has a normal mood and affect.  Nursing note and vitals reviewed.    UC Treatments / Results  Labs (all labs ordered are listed, but  only abnormal results are displayed) Labs Reviewed - No data to display  EKG None  Radiology No results  found.  Procedures Procedures (including critical care time)  Medications Ordered in UC Medications - No data to display  Initial Impression / Assessment and Plan / UC Course  I have reviewed the triage vital signs and the nursing notes.  Pertinent labs & imaging results that were available during my care of the patient were reviewed by me and considered in my medical decision making (see chart for details).     Hidradenitis- will treat the flare up with doxycycline and steroid taper.  Warm compresses.  Continue Advil for pain.  follow up with PCP as needed.  Final Clinical Impressions(s) / UC Diagnoses   Final diagnoses:  None     Discharge Instructions     It was nice meeting you!!  We will treat you with some antibiotics and short round of steroids to see if this helps.  Keep doing the warm compresses. Make sure that you are washing with antibacterial soap.  Follow up with PCP as needed.     ED Prescriptions    Medication Sig Dispense Auth. Provider   doxycycline (VIBRAMYCIN) 100 MG capsule Take 1 capsule (100 mg total) by mouth 2 (two) times daily. 20 capsule Brek Reece A, NP   predniSONE (STERAPRED UNI-PAK 21 TAB) 10 MG (21) TBPK tablet 6 tabs for 1 day, then 5 tabs for 1 das, then 4 tabs for 1 day, then 3 tabs for 1 day, 2 tabs for 1 day, then 1 tab for 1 day 21 tablet Rozanna Box, Dayna Geurts A, NP     Controlled Substance Prescriptions [place] Controlled Substance Registry consulted? Not Applicable   Orvan July, NP 04/24/18 1105

## 2018-04-24 NOTE — ED Triage Notes (Signed)
C/o pt has a boil on her face.

## 2018-04-24 NOTE — Discharge Instructions (Addendum)
It was nice meeting you!!  We will treat you with some antibiotics and short round of steroids to see if this helps.  Keep doing the warm compresses. Make sure that you are washing with antibacterial soap.  Follow up with PCP as needed.

## 2018-05-04 ENCOUNTER — Emergency Department (HOSPITAL_COMMUNITY): Payer: Self-pay

## 2018-05-04 ENCOUNTER — Emergency Department (HOSPITAL_COMMUNITY)
Admission: EM | Admit: 2018-05-04 | Discharge: 2018-05-05 | Disposition: A | Discharge status: Home / Self Care | Payer: Self-pay | Attending: Emergency Medicine | Admitting: Emergency Medicine

## 2018-05-04 ENCOUNTER — Encounter (HOSPITAL_COMMUNITY): Payer: Self-pay | Admitting: Emergency Medicine

## 2018-05-04 ENCOUNTER — Other Ambulatory Visit: Payer: Self-pay

## 2018-05-04 DIAGNOSIS — M549 Dorsalgia, unspecified: Secondary | ICD-10-CM

## 2018-05-04 DIAGNOSIS — Z79899 Other long term (current) drug therapy: Secondary | ICD-10-CM | POA: Insufficient documentation

## 2018-05-04 DIAGNOSIS — N83291 Other ovarian cyst, right side: Secondary | ICD-10-CM | POA: Insufficient documentation

## 2018-05-04 DIAGNOSIS — F17210 Nicotine dependence, cigarettes, uncomplicated: Secondary | ICD-10-CM | POA: Insufficient documentation

## 2018-05-04 DIAGNOSIS — R52 Pain, unspecified: Secondary | ICD-10-CM

## 2018-05-04 DIAGNOSIS — R102 Pelvic and perineal pain: Secondary | ICD-10-CM

## 2018-05-04 DIAGNOSIS — R935 Abnormal findings on diagnostic imaging of other abdominal regions, including retroperitoneum: Secondary | ICD-10-CM

## 2018-05-04 DIAGNOSIS — R0602 Shortness of breath: Secondary | ICD-10-CM | POA: Insufficient documentation

## 2018-05-04 LAB — URINALYSIS, ROUTINE W REFLEX MICROSCOPIC
Bacteria, UA: NONE SEEN
Bilirubin Urine: NEGATIVE
GLUCOSE, UA: NEGATIVE mg/dL
KETONES UR: NEGATIVE mg/dL
Leukocytes, UA: NEGATIVE
Nitrite: NEGATIVE
PROTEIN: NEGATIVE mg/dL
Specific Gravity, Urine: 1.023 (ref 1.005–1.030)
pH: 5 (ref 5.0–8.0)

## 2018-05-04 LAB — POC URINE PREG, ED: Preg Test, Ur: NEGATIVE

## 2018-05-04 MED ORDER — KETOROLAC TROMETHAMINE 30 MG/ML IJ SOLN
30.0000 mg | Freq: Once | INTRAMUSCULAR | Status: AC
  Administered 2018-05-05: 30 mg via INTRAVENOUS
  Filled 2018-05-04: qty 1

## 2018-05-04 MED ORDER — ONDANSETRON HCL 4 MG/2ML IJ SOLN
4.0000 mg | Freq: Once | INTRAMUSCULAR | Status: AC
  Administered 2018-05-05: 4 mg via INTRAVENOUS
  Filled 2018-05-04: qty 2
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
## 2018-05-04 NOTE — ED Triage Notes (Signed)
Patient c/o lower back pain radiating to right flank, urinary frequency, and odor to urine x2 weeks. Reports taking "a few days of different antibiotics with som relief."

## 2018-05-05 ENCOUNTER — Emergency Department (HOSPITAL_COMMUNITY): Payer: Self-pay

## 2018-05-05 MED ORDER — CYCLOBENZAPRINE HCL 5 MG PO TABS: 5.0000 mg | ORAL_TABLET | Freq: Three times a day (TID) | ORAL | 0 refills | Status: DC | PRN

## 2018-05-05 NOTE — Discharge Instructions (Signed)
Use ice and heat for comfort on your back. You can take ibuprofen 600 mg and/or acetaminophen 1000 mg 4 times a day for pain as needed. Call the [facility] to get evaluated for the recurrent ovarian cyst on your right ovary.  Return to the ED if you get a fever or have uncontrolled vomiting. Go to [facility] if you get worsening pain.

## 2018-05-05 NOTE — Progress Notes (Signed)
Cardiology Office Note:    Date:  05/06/2018   ID:  Samantha [name], DOB December 11, 1975, MRN [id number]  PCP:  Steve Rattler, DO  Cardiologist:  Buford Dresser, MD PhD  Referring MD: Albesa Seen, PA-C   Chief Complaint  Patient presents with  . New Patient (Initial Visit)  . Shortness of Breath    constantly.    History of Present Illness:    Samantha [name] is a 42 y.o. female with a hx of back pain, ovarian cyst, tobacco abuse, palpitations who is seen as a new consult at the request of Langston Masker for the evaluation and management of postural tachycardia. She was seen in the ER for shortness of breath and noted these symptoms on 03/20/18. She was last seen in cardiology on 12/26/15 by Dr. Acie Fredrickson for possible pericarditis and palpitations.  Patient concerns: fast heart beats. Last night had very fast beats, felt like she had to speed count beats. Feels like it happens both at rest and with activity. Can't catch her breath, feels dizzy. No frank syncope but feels presyncopal. Had syncope as a child, thinks she had one episode of syncope during a panic attack in the past. It has limited her ability to leave the house because she is afraid that she will pass out or her heart will stop. She is worried about her frequent abscesses, breast lumps and if that is affecting her heart.   Has been dealing with this for the last 7-8 years, gradually getting worse. She feels completely unable to function because of her worry about her symptoms.   Denies chest pain. Does have shortness of breath when her heart is racing. No syncope. No PND, orthopnea, LE edema.  Past Medical History:  Diagnosis Date  . Back pain   . 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 Hyperlipidemia   . Medical history non-contributory   . TIA (transient ischemic attack) 04/2015   questionable, most likely complicated migraine    Past Surgical History:  Procedure Laterality Date  . CERVICAL BIOPSY  W/ LOOP ELECTRODE EXCISION      . LEEP      Current Medications: Current Outpatient Medications on File Prior to Visit  Medication Sig  . cyclobenzaprine (FLEXERIL) 5 MG tablet Take 1 tablet (5 mg total) by mouth 3 (three) times daily as needed for muscle spasms.  Marland Kitchen doxycycline (VIBRAMYCIN) 100 MG capsule Take 1 capsule (100 mg total) by mouth 2 (two) times daily.  Marland Kitchen escitalopram (LEXAPRO) 10 MG tablet Take 1 tablet (10 mg total) by mouth daily.  . hydrOXYzine (ATARAX/VISTARIL) 25 MG tablet Take 1 tablet (25 mg total) by mouth every 6 (six) hours.  Marland Kitchen ibuprofen (ADVIL,MOTRIN) 200 MG tablet Take 200-400 mg by mouth every 6 (six) hours as needed for moderate pain.  . norgestimate-ethinyl estradiol (PREVIFEM) 0.25-35 MG-MCG tablet Take 1 tablet by mouth daily.  . predniSONE (STERAPRED UNI-PAK 21 TAB) 10 MG (21) TBPK tablet 6 tabs for 1 day, then 5 tabs for 1 das, then 4 tabs for 1 day, then 3 tabs for 1 day, 2 tabs for 1 day, then 1 tab for 1 day   No current facility-administered medications on file prior to visit.      Allergies:   Patient has no known allergies.   Social History   Socioeconomic History  . Marital status: Single    Spouse name: Not on file  . Number of children: 1  . Years of education: 53  . Highest education level:  Not on file  Occupational History  . Not on file  Social Needs  . Financial resource strain: Not on file  . Food insecurity:    Worry: Not on file    Inability: Not on file  . Transportation needs:    Medical: Not on file    Non-medical: Not on file  Tobacco Use  . Smoking status: Current Every Day Smoker    Packs/day: 1.00    Years: 17.00    Pack years: 17.00    Types: Cigarettes  . Smokeless tobacco: Never Used  . Tobacco comment: 3 cigarettes a day   Substance and Sexual Activity  . Alcohol use: Yes    Alcohol/week: 1.0 standard drinks    Types: 1 Shots of liquor per week    Comment: drink on the weekends  . Drug use: Yes    Frequency: 3.0 times per week    Types:  Marijuana  . Sexual activity: Yes    Birth control/protection: None  Lifestyle  . Physical activity:    Days per week: 7 days    Minutes per session: 30 min  . Stress: Rather much  Relationships  . Social connections:    Talks on phone: Three times a week    Gets together: More than three times a week    Attends religious service: 1 to 4 times per year    Active member of club or organization: No    Attends meetings of clubs or organizations: Never    Relationship status: Never married  Other Topics Concern  . Not on file  Social History Narrative   Patient drinks sodas all day long.   Patient is right handed.   Drinks a lot of soda (Dr. Malachi Bonds) every day, thinks she drinks 4-5 cans/day. Also drinks water/jioce throughout the day.Trying to cut back but can smoke up to 1 pack of black & milds per day.  Family History: The patient's family history includes Cancer in her sister; Diabetes in her father, maternal grandmother, and sister; Hypertension in her mother; Lupus in her mother; Thyroid disease in her sister. There is no history of Anesthesia problems, Other, or Migraines.  Mom: heart failure, Father: unclear heart history, thinks he has a heart issue.   ROS:   Please see the history of present illness.  Additional pertinent ROS: Review of Systems  Constitutional: Positive for malaise/fatigue. Negative for chills and fever.  HENT: Negative for ear pain and hearing loss.   Eyes: Negative for blurred vision and pain.  Respiratory: Positive for cough and shortness of breath.   Cardiovascular: Positive for palpitations. Negative for chest pain, orthopnea, claudication, leg swelling and PND.  Gastrointestinal: Positive for abdominal pain and nausea. Negative for blood in stool and melena.  Genitourinary: Negative for dysuria and hematuria.  Musculoskeletal: Positive for back pain and myalgias. Negative for falls.  Skin: Positive for rash. Negative for itching.       Hidradenitis  suppurativa  Neurological: Positive for headaches. Negative for focal weakness and loss of consciousness.  Endo/Heme/Allergies: Does not bruise/bleed easily.     EKGs/Labs/Other Studies Reviewed:    The following studies were reviewed today: Echo 04/22/15 Left ventricle: The cavity size was normal. Wall thickness was   normal. Systolic function was normal. The estimated ejection   fraction was in the range of 50% to 55%. Wall motion was normal;   there were no regional wall motion abnormalities. - Mitral valve: There was mild regurgitation.  Monitor 06/14/15 30 day  monitor shows normal sinus rhythm.  No atrial fibrillation.  No significant ectopy.  No marked tachycardia or bradycardia.  Normal 30 day monitor.  EKG:  EKG is ordered today.  The ekg ordered today demonstrates normal sinus rhythm with sinus arrhythmia  Recent Labs: 09/18/2017: TSH 0.961 03/20/2018: ALT 7; B Natriuretic Peptide 74.8; BUN 11; Creatinine, Ser 0.78; Hemoglobin 13.9; Platelets 263; Potassium 3.4; Sodium 139  Recent Lipid Panel    Component Value Date/Time   CHOL 187 10/20/2015 0110   TRIG 53 10/20/2015 0110   HDL 55 10/20/2015 0110   CHOLHDL 3.4 10/20/2015 0110   VLDL 11 10/20/2015 0110   LDLCALC 121 (H) 10/20/2015 0110    Physical Exam:    VS:  BP 127/85   Pulse 72   Ht 5\' 4"  (1.626 m)   Wt 154 lb 9.6 oz (70.1 kg)   LMP 04/24/2018   BMI 26.54 kg/m     Wt Readings from Last 3 Encounters:  05/06/18 154 lb 9.6 oz (70.1 kg)  05/04/18 150 lb (68 kg)  04/24/18 154 lb (69.9 kg)    Orthostatic VS for the past 24 hrs (Last 3 readings):  BP- Lying Pulse- Lying BP- Sitting Pulse- Sitting BP- Standing at 0 minutes Pulse- Standing at 0 minutes BP- Standing at 3 minutes Pulse- Standing at 3 minutes  05/06/18 0938 123/85 66 120/84 70 118/82 86 121/90 92    GEN: Well nourished, well developed in no acute distress HEENT: Normal NECK: No JVD; No carotid bruits LYMPHATICS: No lymphadenopathy CARDIAC:  regular rhythm, normal S1 and S2, no murmurs, rubs, gallops. Radial and DP pulses 2+ bilaterally. RESPIRATORY:  Clear to auscultation without rales, wheezing or rhonchi  ABDOMEN: Soft, non-tender, non-distended MUSCULOSKELETAL:  No edema; No deformity  SKIN: Warm and dry NEUROLOGIC:  Alert and oriented x 3 PSYCHIATRIC:  Normal affect   ASSESSMENT:    1. Tachycardia   2. Palpitation   3. Tobacco abuse counseling   4. Shortness of breath    PLAN:    1. Palpitations: affects her quality of life and ability to function. Does have rise in her heart rate on standing today, but it is not to a very elevated heart rate.  -ordered event monitor. This was normal in the past, but she feels that her symptoms have worsening -instructed her to gradually cut out caffeine and see if this helps her. She is willing to do this -also discussed tobacco cessation. She wants to but not sure she is ready. The patient was counseled on the dangers of tobacco use, and was advised to quit.  Reviewed strategies to maximize success, including removing cigarettes and smoking materials from environment and stress management. -she has significantly limited her activity due to fear surrounding her symptoms. Suspect that deconditioning is not helping.  2. Shortness of breath: had normal echo in the past. Related to her episodes of palpitations. Will continue to monitor. Tobacco cessation as above.  Social situation: currently does not have insurance, cannot fill her medications. Will try to reach out to social work to see how we can best assist her.  Plan for follow up: 3 months  Medication Adjustments/Labs and Tests Ordered: Current medicines are reviewed at length with the patient today.  Concerns regarding medicines are outlined above.  Orders Placed This Encounter  Procedures  . CARDIAC EVENT MONITOR  . EKG 12-Lead   No orders of the defined types were placed in this encounter.   Patient Instructions    Medication  Instructions: Your physician recommends that you continue on your current medications as directed.    If you need a refill on your cardiac medications before your next appointment, please call your pharmacy.   Labwork: None  Procedures/Testing: Your physician has recommended that you wear an event monitor for 7 days. Event monitors are medical devices that record the heart's electrical activity. Doctors most often [place] these monitors to diagnose arrhythmias. Arrhythmias are problems with the speed or rhythm of the heartbeat. The monitor is a small, portable device. You can wear one while you do your normal daily activities. This is usually used to diagnose what is causing palpitations/syncope (passing out). [place] [address]   Follow-Up: Your physician wants you to follow-up in 3 months with Dr. Harrell Gave.   Special Instructions:    Thank you for choosing [facility] at [facility]!!       Signed, Buford Dresser, MD PhD 05/06/2018 6:11 PM    [place]

## 2018-05-06 ENCOUNTER — Ambulatory Visit (INDEPENDENT_AMBULATORY_CARE_PROVIDER_SITE_OTHER): Payer: Self-pay | Admitting: Cardiology

## 2018-05-06 ENCOUNTER — Encounter: Payer: Self-pay | Admitting: Cardiology

## 2018-05-06 VITALS — BP 127/85 | HR 72 | Ht 64.0 in | Wt 154.6 lb

## 2018-05-06 DIAGNOSIS — Z716 Tobacco abuse counseling: Secondary | ICD-10-CM

## 2018-05-06 DIAGNOSIS — R000 Tachycardia, unspecified: Secondary | ICD-10-CM

## 2018-05-06 DIAGNOSIS — R002 Palpitations: Secondary | ICD-10-CM

## 2018-05-06 DIAGNOSIS — R0602 Shortness of breath: Secondary | ICD-10-CM

## 2018-05-06 NOTE — Patient Instructions (Signed)
Medication Instructions: Your physician recommends that you continue on your current medications as directed.    If you need a refill on your cardiac medications before your next appointment, please call your pharmacy.   Labwork: None  Procedures/Testing: Your physician has recommended that you wear an event monitor for 7 days. Event monitors are medical devices that record the heart's electrical activity. Doctors most often [place] these monitors to diagnose arrhythmias. Arrhythmias are problems with the speed or rhythm of the heartbeat. The monitor is a small, portable device. You can wear one while you do your normal daily activities. This is usually used to diagnose what is causing palpitations/syncope (passing out). [place] [address]   Follow-Up: Your physician wants you to follow-up in 3 months with Dr. Harrell Gave.   Special Instructions:    Thank you for choosing Heart[facility] at [facility]!!

## 2018-05-21 ENCOUNTER — Ambulatory Visit: Payer: Self-pay | Admitting: Obstetrics & Gynecology

## 2018-06-04 ENCOUNTER — Emergency Department (HOSPITAL_COMMUNITY): Payer: Self-pay

## 2018-06-04 ENCOUNTER — Emergency Department (HOSPITAL_COMMUNITY)
Admission: EM | Admit: 2018-06-04 | Discharge: 2018-06-04 | Discharge status: Against Medical Advice | Payer: Self-pay | Attending: Emergency Medicine | Admitting: Emergency Medicine

## 2018-06-04 ENCOUNTER — Other Ambulatory Visit: Payer: Self-pay

## 2018-06-04 ENCOUNTER — Encounter (HOSPITAL_COMMUNITY): Payer: Self-pay | Admitting: Emergency Medicine

## 2018-06-04 DIAGNOSIS — R42 Dizziness and giddiness: Secondary | ICD-10-CM | POA: Insufficient documentation

## 2018-06-04 DIAGNOSIS — G4489 Other headache syndrome: Secondary | ICD-10-CM | POA: Insufficient documentation

## 2018-06-04 DIAGNOSIS — Z8673 Personal history of transient ischemic attack (TIA), and cerebral infarction without residual deficits: Secondary | ICD-10-CM | POA: Insufficient documentation

## 2018-06-04 DIAGNOSIS — Z79899 Other long term (current) drug therapy: Secondary | ICD-10-CM | POA: Insufficient documentation

## 2018-06-04 DIAGNOSIS — R200 Anesthesia of skin: Secondary | ICD-10-CM | POA: Insufficient documentation

## 2018-06-04 DIAGNOSIS — H538 Other visual disturbances: Secondary | ICD-10-CM | POA: Insufficient documentation

## 2018-06-04 DIAGNOSIS — F17210 Nicotine dependence, cigarettes, uncomplicated: Secondary | ICD-10-CM | POA: Insufficient documentation

## 2018-06-04 DIAGNOSIS — R112 Nausea with vomiting, unspecified: Secondary | ICD-10-CM | POA: Insufficient documentation

## 2018-06-04 LAB — DIFFERENTIAL
ABS IMMATURE GRANULOCYTES: 0 10*3/uL (ref 0.0–0.1)
BASOS ABS: 0 10*3/uL (ref 0.0–0.1)
BASOS PCT: 1 %
EOS ABS: 0.1 10*3/uL (ref 0.0–0.7)
Eosinophils Relative: 1 %
Immature Granulocytes: 0 %
Lymphocytes Relative: 42 %
Lymphs Abs: 2.9 10*3/uL (ref 0.7–4.0)
MONO ABS: 0.5 10*3/uL (ref 0.1–1.0)
MONOS PCT: 7 %
NEUTROS PCT: 49 %
Neutro Abs: 3.4 10*3/uL (ref 1.7–7.7)

## 2018-06-04 LAB — COMPREHENSIVE METABOLIC PANEL
ALT: 7 U/L (ref 0–44)
AST: 16 U/L (ref 15–41)
Albumin: 3.6 g/dL (ref 3.5–5.0)
Alkaline Phosphatase: 58 U/L (ref 38–126)
Anion gap: 8 (ref 5–15)
BUN: 8 mg/dL (ref 6–20)
CO2: 24 mmol/L (ref 22–32)
Calcium: 9.2 mg/dL (ref 8.9–10.3)
Chloride: 105 mmol/L (ref 98–111)
Creatinine, Ser: 0.73 mg/dL (ref 0.44–1.00)
Glucose, Bld: 97 mg/dL (ref 70–99)
POTASSIUM: 3.9 mmol/L (ref 3.5–5.1)
SODIUM: 137 mmol/L (ref 135–145)
Total Bilirubin: 0.6 mg/dL (ref 0.3–1.2)
Total Protein: 6.6 g/dL (ref 6.5–8.1)

## 2018-06-04 LAB — I-STAT CHEM 8, ED
BUN: 8 mg/dL (ref 6–20)
CREATININE: 0.7 mg/dL (ref 0.44–1.00)
Calcium, Ion: 1.14 mmol/L — ABNORMAL LOW (ref 1.15–1.40)
Chloride: 104 mmol/L (ref 98–111)
GLUCOSE: 97 mg/dL (ref 70–99)
HCT: 42 % (ref 36.0–46.0)
HEMOGLOBIN: 14.3 g/dL (ref 12.0–15.0)
POTASSIUM: 3.8 mmol/L (ref 3.5–5.1)
Sodium: 138 mmol/L (ref 135–145)
TCO2: 25 mmol/L (ref 22–32)

## 2018-06-04 LAB — CBC
HCT: 43.2 % (ref 36.0–46.0)
HEMOGLOBIN: 14 g/dL (ref 12.0–15.0)
MCH: 30.8 pg (ref 26.0–34.0)
MCHC: 32.4 g/dL (ref 30.0–36.0)
MCV: 94.9 fL (ref 78.0–100.0)
PLATELETS: 228 10*3/uL (ref 150–400)
RBC: 4.55 MIL/uL (ref 3.87–5.11)
RDW: 12.5 % (ref 11.5–15.5)
WBC: 6.8 10*3/uL (ref 4.0–10.5)

## 2018-06-04 LAB — PROTIME-INR
INR: 0.91
PROTHROMBIN TIME: 12.2 s (ref 11.4–15.2)

## 2018-06-04 LAB — APTT: APTT: 30 s (ref 24–36)

## 2018-06-04 LAB — I-STAT BETA HCG BLOOD, ED (MC, WL, AP ONLY): I-stat hCG, quantitative: <5 m[IU]/mL (ref <5)

## 2018-06-04 LAB — I-STAT TROPONIN, ED: TROPONIN I, POC: 0 ng/mL (ref 0.00–0.08)

## 2018-06-04 MED ORDER — SODIUM CHLORIDE 0.9 % IV BOLUS
1000.0000 mL | Freq: Once | INTRAVENOUS | Status: AC
  Administered 2018-06-04: 1000 mL via INTRAVENOUS

## 2018-06-04 MED ORDER — METHYLPREDNISOLONE SODIUM SUCC 125 MG IJ SOLR
125.0000 mg | Freq: Once | INTRAMUSCULAR | Status: AC
  Administered 2018-06-04: 125 mg via INTRAVENOUS
  Filled 2018-06-04: qty 2

## 2018-06-04 MED ORDER — KETOROLAC TROMETHAMINE 30 MG/ML IJ SOLN
30.0000 mg | Freq: Once | INTRAMUSCULAR | Status: AC
  Administered 2018-06-04: 30 mg via INTRAVENOUS
  Filled 2018-06-04: qty 1

## 2018-06-04 MED ORDER — METOCLOPRAMIDE HCL 5 MG/ML IJ SOLN
10.0000 mg | Freq: Once | INTRAMUSCULAR | Status: AC
  Administered 2018-06-04: 10 mg via INTRAVENOUS
  Filled 2018-06-04: qty 2

## 2018-06-04 NOTE — ED Provider Notes (Signed)
[facility] EMERGENCY DEPARTMENT Provider Note   CSN: [id number] Arrival date & time: 06/04/18  1526     History   Chief Complaint Chief Complaint  Patient presents with  . Diarrhea  . Migraine  . Emesis  . Dizziness    HPI Shanece [name] is a 42 y.o. female.  HPI Patient has had 4 or 5 days of intermittent frontal headache.  These headaches are associated with nausea.  She gets occasional bright lights in her right eye and right hand tingling.  She also complains of episodic difficulty "getting her words out".  Continues to have headache and nausea.  No weakness or numbness currently.  No recent medication changes.  No known trauma.  Patient has had multiple work-ups in the past for TIA/CVA without acute findings. Past Medical History:  Diagnosis Date  . Back pain   . 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 Hyperlipidemia   . Medical history non-contributory   . TIA (transient ischemic attack) 04/2015   questionable, most likely complicated migraine    Patient Active Problem List   Diagnosis Date Noted  . Amenorrhea 09/17/2017  . Adjustment disorder with mixed anxiety and depressed mood 06/26/2016  . Depressed mood 06/26/2016  . Back pain 01/31/2016  . 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 Acute pericarditis 12/26/2015  . Hidradenitis suppurativa 11/07/2015  . 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 Sinusitis 10/20/2015  . Complicated migraine 14/78/2956  . Marijuana use 10/20/2015  . Smoker 05/04/2015  . TIA (transient ischemic attack) 05/04/2015  . Palpitations 05/04/2015  . Decreased appetite 05/04/2015  . 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 Hyperlipidemia 04/22/2015  . TOA (tubo-ovarian abscess) 11/23/2012  . History of abnormal Pap smear 12/20/2011  . History of PID 12/20/2011    Past Surgical History:  Procedure Laterality Date  . CERVICAL BIOPSY  W/ LOOP ELECTRODE EXCISION    . LEEP       OB History    Gravida  2   Para      Term      Preterm      AB  1   Living  1     SAB  1   TAB      Ectopic      Multiple      Live Births               Home Medications    Prior to Admission medications   Medication Sig Start Date End Date Taking? Authorizing Provider  cyclobenzaprine (FLEXERIL) 5 MG tablet Take 1 tablet (5 mg total) by mouth 3 (three) times daily as needed for muscle spasms. 05/05/18   Rolland Porter, MD  doxycycline (VIBRAMYCIN) 100 MG capsule Take 1 capsule (100 mg total) by mouth 2 (two) times daily. 04/24/18   Bast, Tressia Miners A, NP  escitalopram (LEXAPRO) 10 MG tablet Take 1 tablet (10 mg total) by mouth daily. 11/12/17   Steve Rattler, DO  hydrOXYzine (ATARAX/VISTARIL) 25 MG tablet Take 1 tablet (25 mg total) by mouth every 6 (six) hours. 03/20/18   Couture, Cortni S, PA-C  ibuprofen (ADVIL,MOTRIN) 200 MG tablet Take 200-400 mg by mouth every 6 (six) hours as needed for moderate pain.    [provider]  norgestimate-ethinyl estradiol (PREVIFEM) 0.25-35 MG-MCG tablet Take 1 tablet by mouth daily. 11/12/17   Steve Rattler, DO  predniSONE (STERAPRED UNI-PAK 21 TAB) 10 MG (21) TBPK tablet 6 tabs for 1 day, then 5 tabs for 1 das,  then 4 tabs for 1 day, then 3 tabs for 1 day, 2 tabs for 1 day, then 1 tab for 1 day 04/24/18   Orvan July, NP    Family History Family History  Problem Relation Age of Onset  . Hypertension Mother   . Lupus Mother   . Diabetes Father   . Thyroid disease Sister   . Diabetes Sister   . Cancer Sister        stomach  . Diabetes Maternal Grandmother   . Anesthesia problems Neg Hx   . Other Neg Hx   . Migraines Neg Hx     Social History Social History   Tobacco Use  . Smoking status: Current Every Day Smoker    Packs/day: 1.00    Years: 17.00    Pack years: 17.00    Types: Cigarettes  . Smokeless tobacco: Never Used  . Tobacco comment: 3 cigarettes a day   Substance Use Topics  . Alcohol use: Yes    Alcohol/week: 1.0 standard drinks    Types: 1 Shots  of liquor per week    Comment: drink on the weekends  . Drug use: Yes    Frequency: 3.0 times per week    Types: Marijuana     Allergies   Patient has no known allergies.   Review of Systems Review of Systems  Constitutional: Negative for chills and fever.  HENT: Negative for facial swelling, sinus pressure and sore throat.   Eyes: Positive for visual disturbance. Negative for pain.  Respiratory: Negative for cough and shortness of breath.   Cardiovascular: Negative for chest pain, palpitations and leg swelling.  Gastrointestinal: Positive for nausea and vomiting. Negative for abdominal pain and constipation.  Genitourinary: Negative for dysuria, flank pain and frequency.  Musculoskeletal: Negative for back pain, gait problem, myalgias and neck pain.  Skin: Negative for rash and wound.  Neurological: Positive for speech difficulty, numbness and headaches. Negative for dizziness, syncope, weakness and light-headedness.  All other systems reviewed and are negative.    Physical Exam Updated Vital Signs BP 121/87   Pulse 78   Temp 98.6 F (37 C) (Oral)   Resp 17   Ht 5\' 4"  (1.626 m)   Wt 69.9 kg   LMP 06/01/2018   SpO2 100%   BMI 26.43 kg/m   Physical Exam  Constitutional: She is oriented to person, place, and time. She appears well-developed and well-nourished. No distress.  HENT:  Head: Normocephalic and atraumatic.  Mouth/Throat: Oropharynx is clear and moist. No oropharyngeal exudate.  Cranial nerves II through XII grossly intact.  Eyes: Pupils are equal, round, and reactive to light. EOM are normal.  Neck: Normal range of motion. Neck supple. No JVD present.  Cardiovascular: Normal rate and regular rhythm. Exam reveals no gallop and no friction rub.  No murmur heard. Pulmonary/Chest: Effort normal and breath sounds normal. No stridor. No respiratory distress. She has no wheezes. She has no rales. She exhibits no tenderness.  Abdominal: Soft. Bowel sounds are  normal. There is no tenderness. There is no rebound and no guarding.  Musculoskeletal: Normal range of motion. She exhibits no edema or tenderness.  Lymphadenopathy:    She has no cervical adenopathy.  Neurological: She is alert and oriented to person, place, and time.  Patient is alert and oriented x3 with clear, goal oriented speech. Patient has 5/5 motor in all extremities. Sensation is intact to light touch. Bilateral finger-to-nose is normal with no signs of dysmetria.  Skin:  Skin is warm and dry. No rash noted. She is not diaphoretic. No erythema.  Psychiatric: She has a normal mood and affect. Her behavior is normal.  Nursing note and vitals reviewed.    ED Treatments / Results  Labs (all labs ordered are listed, but only abnormal results are displayed) Labs Reviewed  I-STAT CHEM 8, ED - Abnormal; Notable for the following components:      Result Value   Calcium, Ion 1.14 (*)    All other components within normal limits  PROTIME-INR  APTT  CBC  DIFFERENTIAL  COMPREHENSIVE METABOLIC PANEL  I-STAT TROPONIN, ED  CBG MONITORING, ED  I-STAT BETA HCG BLOOD, ED ([facility], [facility], [facility] ONLY)    EKG EKG Interpretation  Date/Time:  Wednesday June 04 2018 16:06:34 EDT Ventricular Rate:  66 PR Interval:  142 QRS Duration: 72 QT Interval:  416 QTC Calculation: 436 R Axis:   1 Text Interpretation:  Normal sinus rhythm Possible Left atrial enlargement Possible Anterior infarct , age undetermined Abnormal ECG Confirmed by Julianne Rice [phone]) on 06/04/2018 8:02:34 PM   Radiology No results found.  Procedures Procedures (including critical care time)  Medications Ordered in ED Medications  sodium chloride 0.9 % bolus 1,000 mL (0 mLs Intravenous Stopped 06/04/18 2107)  ketorolac (TORADOL) 30 MG/ML injection 30 mg (30 mg Intravenous Given 06/04/18 2042)  methylPREDNISolone sodium succinate (SOLU-MEDROL) 125 mg/2 mL injection 125 mg (125 mg Intravenous Given 06/04/18 2042)    metoCLOPramide (REGLAN) injection 10 mg (10 mg Intravenous Given 06/04/18 2042)     Initial Impression / Assessment and Plan / ED Course  I have reviewed the triage vital signs and the nursing notes.  Pertinent labs & imaging results that were available during my care of the patient were reviewed by me and considered in my medical decision making (see chart for details).     Suspect complicated migraine.  Will treat with IV fluids and migraine cocktail.  Will discuss with neurology. Patient left prior to discussion with neurology.  Told RN that her headache was resolved as well as her other symptoms. Final Clinical Impressions(s) / ED Diagnoses   Final diagnoses:  Other headache syndrome    ED Discharge Orders    None       Julianne Rice, MD 06/07/18 0710

## 2018-06-04 NOTE — ED Notes (Signed)
Pt up to ask about wait time, pt encouraged to stay and see a doctor. Pt outside to smoke and "try to calm down".

## 2018-06-04 NOTE — ED Notes (Signed)
ED Provider at bedside. 

## 2018-06-04 NOTE — ED Triage Notes (Signed)
Pt presents with headache, n/v/d, and dizziness x 3 days; pt reports hx of migraines but never this bad; pt also reports hx of transient aphasia, tingling-- now resolved

## 2018-06-04 NOTE — ED Notes (Signed)
Pt transported to CT ?

## 2018-06-04 NOTE — ED Notes (Signed)
Pt calling out stating that her headache has improved and that she wanted to leave to meet her baby sitter before they left. Pt advised that we are still waiting on the neurology consult and that we are not finished with her workup. Pt stated that she feels better and is going to leave. This nurse advised Dr. Lita Mains and pt signed out AMA.

## 2018-06-04 NOTE — ED Notes (Signed)
Pt states she is "not dizzy or throwing up anymore" and would like to "just go to a hallway bed".

## 2018-06-25 ENCOUNTER — Encounter: Payer: Self-pay | Admitting: Obstetrics & Gynecology

## 2018-06-25 ENCOUNTER — Ambulatory Visit (INDEPENDENT_AMBULATORY_CARE_PROVIDER_SITE_OTHER): Payer: Self-pay | Admitting: Obstetrics & Gynecology

## 2018-06-25 ENCOUNTER — Ambulatory Visit: Payer: Self-pay | Admitting: Clinical

## 2018-06-25 VITALS — BP 132/78 | HR 71 | Wt 153.4 lb

## 2018-06-25 DIAGNOSIS — Z0000 Encounter for general adult medical examination without abnormal findings: Secondary | ICD-10-CM

## 2018-06-25 DIAGNOSIS — N83201 Unspecified ovarian cyst, right side: Secondary | ICD-10-CM

## 2018-06-25 DIAGNOSIS — Z1331 Encounter for screening for depression: Secondary | ICD-10-CM

## 2018-06-25 DIAGNOSIS — Z113 Encounter for screening for infections with a predominantly sexual mode of transmission: Secondary | ICD-10-CM

## 2018-06-25 NOTE — Progress Notes (Signed)
Patient ID: Samantha [name], female   DOB: 1976/08/08, 42 y.o.   MRN: [id number]  Chief Complaint  Patient presents with  . Ovarian Cyst    HPI Samantha [name] is a 41 y.o. female.  Single P1 (51 yo daughter) here for follow up after a CT and u/s found an incidental 7 cm complex right ovarian cyst. She has a h/o bilateral TOAs that kept her in [facility] for several day in 2014. She has a distant h/o trich, GC, and CT. She has not had recent testing. She is currently in a monogamous relationship.    Past Medical History:  Diagnosis Date  . Back pain   . 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 Hyperlipidemia   . Medical history non-contributory   . TIA (transient ischemic attack) 04/2015   questionable, most likely complicated migraine    Past Surgical History:  Procedure Laterality Date  . CERVICAL BIOPSY  W/ LOOP ELECTRODE EXCISION    . LEEP      Family History  Problem Relation Age of Onset  . Hypertension Mother   . Lupus Mother   . Diabetes Father   . Thyroid disease Sister   . Diabetes Sister   . Cancer Sister        stomach  . Diabetes Maternal Grandmother   . Anesthesia problems Neg Hx   . Other Neg Hx   . Migraines Neg Hx     Social History Social History   Tobacco Use  . Smoking status: Current Every Day Smoker    Packs/day: 1.00    Years: 17.00    Pack years: 17.00    Types: Cigarettes  . Smokeless tobacco: Never Used  . Tobacco comment: 3 cigarettes a day   Substance Use Topics  . Alcohol use: Yes    Alcohol/week: 1.0 standard drinks    Types: 1 Shots of liquor per week    Comment: drink on the weekends  . Drug use: Yes    Frequency: 3.0 times per week    Types: Marijuana    No Known Allergies  Current Outpatient Medications  Medication Sig Dispense Refill  . escitalopram (LEXAPRO) 10 MG tablet Take 1 tablet (10 mg total) by mouth daily. 30 tablet 0  . ibuprofen (ADVIL,MOTRIN) 200 MG tablet Take 200-400 mg by mouth every 6 (six) hours as needed for moderate pain.     . cyclobenzaprine (FLEXERIL) 5 MG tablet Take 1 tablet (5 mg total) by mouth 3 (three) times daily as needed for muscle spasms. (Patient not taking: Reported on 06/25/2018) 30 tablet 0  . doxycycline (VIBRAMYCIN) 100 MG capsule Take 1 capsule (100 mg total) by mouth 2 (two) times daily. (Patient not taking: Reported on 06/25/2018) 20 capsule 0  . hydrOXYzine (ATARAX/VISTARIL) 25 MG tablet Take 1 tablet (25 mg total) by mouth every 6 (six) hours. (Patient not taking: Reported on 06/25/2018) 12 tablet 0  . norgestimate-ethinyl estradiol (PREVIFEM) 0.25-35 MG-MCG tablet Take 1 tablet by mouth daily. (Patient not taking: Reported on 06/25/2018) 1 Package 11  . predniSONE (STERAPRED UNI-PAK 21 TAB) 10 MG (21) TBPK tablet 6 tabs for 1 day, then 5 tabs for 1 das, then 4 tabs for 1 day, then 3 tabs for 1 day, 2 tabs for 1 day, then 1 tab for 1 day (Patient not taking: Reported on 06/25/2018) 21 tablet 0   No current facility-administered medications for this visit.     Review of Systems Review of Systems  Blood pressure 132/78, pulse 71, weight  153 lb 6.4 oz (69.6 kg), last menstrual period 06/11/2018.  Physical Exam Physical Exam Breathing, conversing, and ambulating normally Well nourished, well hydrated Black female, no apparent distress Abd- benign Vaginal discharge c/w yeast Bimanual exam reveals a normal size and shape, anteverted, mobile, non-tender uterus. Her right ovary was palpable but not tender.  Data Reviewed Her CT and u/s as well as her records were reviewed  Assessment    Preventative care Right ovarian cyst    Plan    She was given the number for the free pap clinic (one is to be held tomorrow) She will get blood STI testing and Gardasil [facility] health dept I will check a CA-125 today and a follow up gyn u/s will be scheduled Check CT and GC today Follow up here in 4 weeks       [place] 06/25/2018, 9:05 AM

## 2018-06-25 NOTE — BH Specialist Note (Signed)
Integrated Behavioral Health Initial Visit  MRN: [id number] Name: Samantha [name]  Number of [place] Clinician visits:: 1/6 2 Total Session Start time: 9:08 Session End time: 9:21 Total time: 15 minutes  Type of Service: [place] Interpretor:No. Interpretor Name and Language: n/a   Warm Hand Off Completed.       SUBJECTIVE: Samantha [name] is a 42 y.o. female accompanied by n/a Patient was referred by Clovia Cuff, MD for positive depression screen. Patient reports the following symptoms/concerns: Pt states that she is not suicidal, still has her Safety Plan created at our last visit, and is taking her [place] medication as needed, because she cannot afford to take daily. Pt agrees to talk to her pharmacist and PCP about affordable medication options. Pt has not reached out to HS support group; feeling added stress today, due to no bus stop at [facility], recent illness, and ongoing pain.  Duration of problem: ongoing; Severity of problem: moderate  OBJECTIVE: Mood: Normal and Affect: Appropriate Risk of harm to self or others: Suicidal ideation No plan to harm self or others; Denies current SI; denies plan; thoughts of death come and go for years  LIFE CONTEXT: Family and Social: Pt lives by herself(section 8 housing), still takes care of her mother; close relationship with her grown daughter and a niece School/Work: Unemployed due to chronic pain Self-Care: - Life Changes: Perimenopause, loss of job in the past year, loss of 4 friends/family members within past about 18 months  GOALS ADDRESSED: Patient will: 1. Reduce symptoms of: anxiety and depression 2. Increase knowledge and/or ability of: healthy habits  3. Demonstrate ability to: Increase adequate support systems for patient/family and Improve medication compliance  INTERVENTIONS: Interventions utilized: Supportive Counseling  Standardized Assessments completed: GAD-7  and PHQ 9  ASSESSMENT: Patient currently experiencing Major depressive disorder,recurrent, moderate and Psychosocial stress   Patient may benefit from continued brief therapeutic interventions regarding coping with symptoms of depression and anxiety.  PLAN: 1. Follow up with behavioral health clinician on : As needed 2. Behavioral recommendations:  -Talk to PCP and pharmacist about affordable BH medication options -Take BH medication every day, as prescribed by medical provider -Consider letting [place] know that the original bus stop for [facility] needs to be brought back -Consider contacting HS support group for additional support -Consider contacting Hidraden[facility] [place] at [email or web address], for self-education in managing HS 3. Referral(s): [place] (In Clinic) 4. "From scale of 1-10, how likely are you to follow plan?": -  Garlan Fair, LCSW  Depression screen [facility] 2/9 06/25/2018 09/18/2017 06/26/2016 11/07/2015 05/04/2015  Decreased Interest 1 3 3 1  0  Down, Depressed, Hopeless 1 3 3 1  0  PHQ - 2 Score 2 6 6 2  0  Altered sleeping 3 3 3 1  -  Tired, decreased energy 3 3 3 1  -  Change in appetite 1 3 3 1  -  Feeling bad or failure about yourself  1 3 3  0 -  Trouble concentrating 1 0 3 0 -  Moving slowly or fidgety/restless 0 0 0 3 -  Suicidal thoughts 1 3 3  0 -  PHQ-9 Score 12 21 24 8  -  Difficult doing work/chores - - - Somewhat difficult -   GAD 7 : Generalized Anxiety Score 06/25/2018 09/18/2017  Nervous, Anxious, on Edge 3 3  Control/stop worrying 2 1  Worry too much - different things 3 1  Trouble relaxing 3 0  Restless 0 0  Easily  annoyed or irritable 1 0  Afraid - awful might happen 3 0  Total GAD 7 Score 15 5

## 2018-06-26 LAB — CA 125: CANCER ANTIGEN (CA) 125: 6.8 U/mL (ref 0.0–38.1)

## 2018-06-26 LAB — CERVICOVAGINAL ANCILLARY ONLY
CHLAMYDIA, DNA PROBE: NEGATIVE
Neisseria Gonorrhea: NEGATIVE

## 2018-06-27 ENCOUNTER — Ambulatory Visit (HOSPITAL_COMMUNITY): Payer: Self-pay

## 2018-07-01 ENCOUNTER — Telehealth: Payer: Self-pay | Admitting: Cardiology

## 2018-07-01 NOTE — Telephone Encounter (Signed)
Wanted to make you aware that the patient would not have the monitor completed, the order will expire, not sure how to cancel the order. Thank you!

## 2018-07-01 NOTE — Telephone Encounter (Signed)
New Message   Good morning, can we cancel the order for the event monitor , patient never returned the hardship letter to have the monitor put on?

## 2018-07-02 ENCOUNTER — Ambulatory Visit (HOSPITAL_COMMUNITY)
Admission: RE | Admit: 2018-07-02 | Discharge: 2018-07-02 | Disposition: A | Discharge status: Home / Self Care | Payer: Self-pay | Source: Ambulatory Visit | Attending: Obstetrics & Gynecology | Admitting: Obstetrics & Gynecology

## 2018-07-02 DIAGNOSIS — N83201 Unspecified ovarian cyst, right side: Secondary | ICD-10-CM | POA: Insufficient documentation

## 2018-08-04 ENCOUNTER — Ambulatory Visit: Payer: Self-pay | Admitting: Cardiology

## 2018-08-19 ENCOUNTER — Ambulatory Visit: Payer: Self-pay | Admitting: Cardiology

## 2018-08-20 ENCOUNTER — Encounter: Payer: Self-pay | Admitting: *Deleted

## 2018-09-04 ENCOUNTER — Telehealth: Payer: Self-pay | Admitting: Cardiology

## 2018-09-04 NOTE — Telephone Encounter (Signed)
09-03-18. Pt has not responded to request for monitor. See notes below.  8/28/1 9  PATIENT WAS GIVEN HARDSHIP PAPER/D.MILLER 06/30/18 sent note to shelly to see if pt returned hardship letter [facility]  This order will be removed.

## 2018-09-26 ENCOUNTER — Encounter (HOSPITAL_COMMUNITY): Payer: Self-pay | Admitting: Emergency Medicine

## 2018-09-26 ENCOUNTER — Emergency Department (HOSPITAL_COMMUNITY): Payer: Self-pay

## 2018-09-26 ENCOUNTER — Other Ambulatory Visit: Payer: Self-pay

## 2018-09-26 ENCOUNTER — Emergency Department (HOSPITAL_COMMUNITY)
Admission: EM | Admit: 2018-09-26 | Discharge: 2018-09-26 | Disposition: A | Discharge status: Home / Self Care | Payer: Self-pay | Attending: Emergency Medicine | Admitting: Emergency Medicine

## 2018-09-26 DIAGNOSIS — Z8673 Personal history of transient ischemic attack (TIA), and cerebral infarction without residual deficits: Secondary | ICD-10-CM | POA: Insufficient documentation

## 2018-09-26 DIAGNOSIS — R2242 Localized swelling, mass and lump, left lower limb: Secondary | ICD-10-CM | POA: Insufficient documentation

## 2018-09-26 DIAGNOSIS — F1210 Cannabis abuse, uncomplicated: Secondary | ICD-10-CM | POA: Insufficient documentation

## 2018-09-26 DIAGNOSIS — F17210 Nicotine dependence, cigarettes, uncomplicated: Secondary | ICD-10-CM | POA: Insufficient documentation

## 2018-09-26 DIAGNOSIS — M25572 Pain in left ankle and joints of left foot: Secondary | ICD-10-CM | POA: Insufficient documentation

## 2018-09-26 MED ORDER — ACETAMINOPHEN 325 MG PO TABS
650.0000 mg | ORAL_TABLET | Freq: Once | ORAL | Status: AC
  Administered 2018-09-26: 650 mg via ORAL
  Filled 2018-09-26: qty 2

## 2018-09-26 NOTE — ED Provider Notes (Signed)
[facility] EMERGENCY DEPARTMENT Provider Note   CSN: [id number] Arrival date & time: 09/26/18  1109     History   Chief Complaint Chief Complaint  Patient presents with  . Ankle Pain    HPI Samantha [name] is a 43 y.o. female presenting to the emergency department with gradual onset of left foot pain that began yesterday evening.  Patient states pain is located on the dorsum of her ankle, worse with weightbearing.  No known injury.  She states she has associated swelling.  No interventions tried prior to arrival.  No fevers or chills.  No history of gout  The history is provided by the patient.    Past Medical History:  Diagnosis Date  . Back pain   . 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 Hyperlipidemia   . Medical history non-contributory   . TIA (transient ischemic attack) 04/2015   questionable, most likely complicated migraine    Patient Active Problem List   Diagnosis Date Noted  . Amenorrhea 09/17/2017  . Adjustment disorder with mixed anxiety and depressed mood 06/26/2016  . Depressed mood 06/26/2016  . Back pain 01/31/2016  . 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 Acute pericarditis 12/26/2015  . Hidradenitis suppurativa 11/07/2015  . 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 Sinusitis 10/20/2015  . Complicated migraine 26/83/4196  . Marijuana use 10/20/2015  . Smoker 05/04/2015  . TIA (transient ischemic attack) 05/04/2015  . Palpitations 05/04/2015  . Decreased appetite 05/04/2015  . 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 Hyperlipidemia 04/22/2015  . TOA (tubo-ovarian abscess) 11/23/2012  . History of abnormal Pap smear 12/20/2011  . History of PID 12/20/2011    Past Surgical History:  Procedure Laterality Date  . CERVICAL BIOPSY  W/ LOOP ELECTRODE EXCISION    . LEEP       OB History    Gravida  2   Para      Term      Preterm      AB  1   Living  1     SAB  1   TAB      Ectopic      Multiple      Live Births               Home Medications    Prior to Admission medications   Medication Sig Start Date End Date Taking? Authorizing Provider  cyclobenzaprine (FLEXERIL) 5 MG tablet Take 1 tablet (5 mg total) by mouth 3 (three) times daily as needed for muscle spasms. Patient not taking: Reported on 06/25/2018 05/05/18   Rolland Porter, MD  doxycycline (VIBRAMYCIN) 100 MG capsule Take 1 capsule (100 mg total) by mouth 2 (two) times daily. Patient not taking: Reported on 06/25/2018 04/24/18   Loura Halt A, NP  escitalopram (LEXAPRO) 10 MG tablet Take 1 tablet (10 mg total) by mouth daily. 11/12/17   Steve Rattler, DO  hydrOXYzine (ATARAX/VISTARIL) 25 MG tablet Take 1 tablet (25 mg total) by mouth every 6 (six) hours. Patient not taking: Reported on 06/25/2018 03/20/18   Couture, Cortni S, PA-C  ibuprofen (ADVIL,MOTRIN) 200 MG tablet Take 200-400 mg by mouth every 6 (six) hours as needed for moderate pain.    [provider]  norgestimate-ethinyl estradiol (PREVIFEM) 0.25-35 MG-MCG tablet Take 1 tablet by mouth daily. Patient not taking: Reported on 06/25/2018 11/12/17   Steve Rattler, DO  predniSONE (STERAPRED UNI-PAK 21 TAB) 10 MG (21) TBPK tablet 6 tabs for 1 day, then  5 tabs for 1 das, then 4 tabs for 1 day, then 3 tabs for 1 day, 2 tabs for 1 day, then 1 tab for 1 day Patient not taking: Reported on 06/25/2018 04/24/18   Orvan July, NP    Family History Family History  Problem Relation Age of Onset  . Hypertension Mother   . Lupus Mother   . Diabetes Father   . Thyroid disease Sister   . Diabetes Sister   . Cancer Sister        stomach  . Diabetes Maternal Grandmother   . Anesthesia problems Neg Hx   . Other Neg Hx   . Migraines Neg Hx     Social History Social History   Tobacco Use  . Smoking status: Current Every Day Smoker    Packs/day: 1.00    Years: 17.00    Pack years: 17.00    Types: Cigarettes  . Smokeless tobacco: Never Used  . Tobacco comment: 3 cigarettes a day     Substance Use Topics  . Alcohol use: Yes    Alcohol/week: 1.0 standard drinks    Types: 1 Shots of liquor per week    Comment: drink on the weekends  . Drug use: Yes    Frequency: 3.0 times per week    Types: Marijuana     Allergies   Patient has no known allergies.   Review of Systems Review of Systems  Constitutional: Negative for fever.  Musculoskeletal: Positive for arthralgias and joint swelling.     Physical Exam Updated Vital Signs BP (!) 129/93 (BP Location: Right Arm)   Pulse 83   Temp 98 F (36.7 C) (Oral)   Resp 16   Ht 5\' 4"  (1.626 m)   Wt 68 kg   SpO2 99%   BMI 25.75 kg/m   Physical Exam Vitals signs and nursing note reviewed.  Constitutional:      General: She is not in acute distress.    Appearance: She is well-developed.  HENT:     Head: Normocephalic and atraumatic.  Eyes:     Conjunctiva/sclera: Conjunctivae normal.  Cardiovascular:     Rate and Rhythm: Normal rate.  Pulmonary:     Effort: Pulmonary effort is normal.  Musculoskeletal:     Comments: Left ankle without deformity, swelling, color change, or wounds.  There is mild tenderness with palpation of the dorsal aspect of the ankle.  Normal active range of motion in all directions.  Normal sensation and pulses.  Neurological:     Mental Status: She is alert.  Psychiatric:        Mood and Affect: Mood normal.        Behavior: Behavior normal.      ED Treatments / Results  Labs (all labs ordered are listed, but only abnormal results are displayed) Labs Reviewed - No data to display  EKG None  Radiology Dg Ankle Complete Left  Result Date: 09/26/2018 CLINICAL DATA:  Acute left ankle pain and swelling without known injury. EXAM: LEFT ANKLE COMPLETE - 3+ VIEW COMPARISON:  None. FINDINGS: There is no evidence of fracture, dislocation, or joint effusion. There is no evidence of arthropathy or other focal bone abnormality. Soft tissues are unremarkable. IMPRESSION: Negative.  Electronically Signed   By: Marijo Conception, M.D.   On: 09/26/2018 12:59    Procedures Procedures (including critical care time)  Medications Ordered in ED Medications - No data to display   Initial Impression / Assessment and Plan /  ED Course  I have reviewed the triage vital signs and the nursing notes.  Pertinent labs & imaging results that were available during my care of the patient were reviewed by me and considered in my medical decision making (see chart for details).    Patient with left ankle pain that began yesterday.  No particular injury.  Exam is not consistent with septic arthritis or gout; no swelling, warmth or redness.  Normal range of motion.  X-ray is negative.  Pain medication declined, pt reports only pain with weight bearing. Recommend symptomatic management and PCP follow-up if symptoms persist.  Agreeable to plan and safe for discharge.  Discussed results, findings, treatment and follow up. Patient advised of return precautions. Patient verbalized understanding and agreed with plan.  Final Clinical Impressions(s) / ED Diagnoses   Final diagnoses:  Acute left ankle pain    ED Discharge Orders    None       Damesha Lawler, [place] N, PA-C 09/26/18 1401    Valarie Merino, MD 09/26/18 1820

## 2018-09-26 NOTE — Discharge Instructions (Addendum)
Please read instructions below. Apply ice to your ankle for 20 minutes at a time. You can take ibuprofen every 6 hours as needed for pain. Schedule an appointment with your primary care provider if symptoms persist. Return to the ER for new or concerning symptoms.

## 2018-09-26 NOTE — ED Triage Notes (Signed)
Pt arrives to ED from home with complaints of left foot pain and swelling since last night. Pulses palpable 2+.

## 2018-09-26 NOTE — ED Notes (Signed)
Patient verbalizes understanding of discharge instructions. Opportunity for questioning and answers were provided. Armband removed by staff, pt discharged from ED ambulatory to home.  

## 2018-10-03 ENCOUNTER — Other Ambulatory Visit: Payer: Self-pay

## 2018-10-03 ENCOUNTER — Encounter: Payer: Self-pay | Admitting: Family Medicine

## 2018-10-04 ENCOUNTER — Emergency Department (HOSPITAL_COMMUNITY)
Admission: EM | Admit: 2018-10-04 | Discharge: 2018-10-04 | Disposition: A | Discharge status: Home / Self Care | Payer: Self-pay | Attending: Emergency Medicine | Admitting: Emergency Medicine

## 2018-10-04 ENCOUNTER — Other Ambulatory Visit: Payer: Self-pay

## 2018-10-04 ENCOUNTER — Encounter (HOSPITAL_COMMUNITY): Payer: Self-pay | Admitting: Emergency Medicine

## 2018-10-04 DIAGNOSIS — Z5321 Procedure and treatment not carried out due to patient leaving prior to being seen by health care provider: Secondary | ICD-10-CM | POA: Insufficient documentation

## 2018-10-04 DIAGNOSIS — R509 Fever, unspecified: Secondary | ICD-10-CM | POA: Insufficient documentation

## 2018-10-04 MED ORDER — ACETAMINOPHEN 325 MG PO TABS
650.0000 mg | ORAL_TABLET | Freq: Once | ORAL | Status: AC | PRN
  Administered 2018-10-04: 650 mg via ORAL
  Filled 2018-10-04: qty 2

## 2018-10-04 NOTE — ED Triage Notes (Signed)
Patient c/o fever, productive cough, congestion and body aches x3 days. Febrile in triage. Last dose ibuprofen x1 hour ago.

## 2018-10-05 ENCOUNTER — Other Ambulatory Visit: Payer: Self-pay

## 2018-10-05 ENCOUNTER — Ambulatory Visit (HOSPITAL_COMMUNITY)
Admission: EM | Admit: 2018-10-05 | Discharge: 2018-10-05 | Disposition: A | Discharge status: Home / Self Care | Payer: Self-pay | Attending: Physician Assistant | Admitting: Physician Assistant

## 2018-10-05 ENCOUNTER — Encounter (HOSPITAL_COMMUNITY): Payer: Self-pay | Admitting: *Deleted

## 2018-10-05 DIAGNOSIS — J111 Influenza due to unidentified influenza virus with other respiratory manifestations: Secondary | ICD-10-CM

## 2018-10-05 DIAGNOSIS — R69 Illness, unspecified: Secondary | ICD-10-CM | POA: Insufficient documentation

## 2018-10-05 HISTORY — DX: Hidradenitis suppurativa: L73.2

## 2018-10-05 MED ORDER — AMOXICILLIN-POT CLAVULANATE 875-125 MG PO TABS: 1.0000 | ORAL_TABLET | Freq: Two times a day (BID) | ORAL | 0 refills | Status: DC

## 2018-10-05 MED ORDER — IBUPROFEN 800 MG PO TABS: 800.0000 mg | ORAL_TABLET | Freq: Two times a day (BID) | ORAL | 0 refills | Status: DC

## 2018-10-05 NOTE — Discharge Instructions (Addendum)
Please start the antibiotic today.  Have given you some ibuprofen 800 mg which you can take 1 tab every 8-12 hours.  Come back in a couple days if not feeling much better.

## 2018-10-05 NOTE — ED Triage Notes (Addendum)
C/O cough x 5 days with fever up to 102.  Was at [facility] last night, but left prior to dx due to wait.  C/O vomiting and diarrhea also.  Reports abscess to left axilla x 2 days.

## 2018-10-05 NOTE — ED Provider Notes (Signed)
10/05/2018 11:18 AM   DOB: [birth year] / MRN: [id number]  SUBJECTIVE:  Samantha [name] is a 43 y.o. female presenting for cough, fever, chills, headache, myalgia.  Symptoms started about a week ago.  Patient felt she was getting better and suddenly worsened yesterday.  Has not tried anything for this.  She denies chest pain, shortness of breath, DOE.   She associates a "boil" under the left arm.  Tells me is very painful.  She has No Known Allergies.   She  has a past medical history of Back pain, Basilar migraine (11/01/2015), Hydradenitis, Hyperlipidemia, and TIA (transient ischemic attack) (04/2015).    She  reports that she has been smoking cigars. She has a 17.00 pack-year smoking history. She has never used smokeless tobacco. She reports current alcohol use of about 1.0 standard drinks of alcohol per week. She reports current drug use. Frequency: 3.00 times per week. Drug: Marijuana. She  reports previously being sexually active. She reports using the following method of birth control/protection: None. The patient  has a past surgical history that includes LEEP and Cervical biopsy w/ loop electrode excision.  Her family history includes Cancer in her sister; Diabetes in her father, maternal grandmother, and sister; Hypertension in her mother; Lupus in her mother; Thyroid disease in her sister.  ROS per HPI  OBJECTIVE:  BP 119/87   Pulse 95   Temp 99.3 F (37.4 C)   Resp 18   LMP 09/12/2018 (Approximate)   SpO2 98%   Wt Readings from Last 3 Encounters:  09/26/18 150 lb (68 kg)  06/25/18 153 lb 6.4 oz (69.6 kg)  06/04/18 154 lb (69.9 kg)   Temp Readings from Last 3 Encounters:  10/05/18 99.3 F (37.4 C)  09/26/18 98 F (36.7 C) (Oral)  06/04/18 98.6 F (37 C) (Oral)   BP Readings from Last 3 Encounters:  10/05/18 119/87  09/26/18 (!) 129/93  06/25/18 132/78   Pulse Readings from Last 3 Encounters:  10/05/18 95  09/26/18 83  06/25/18 71    Physical Exam Vitals signs  reviewed.  Constitutional:      General: She is not in acute distress.    Appearance: She is not diaphoretic.  Eyes:     Pupils: Pupils are equal, round, and reactive to light.  Cardiovascular:     Rate and Rhythm: Normal rate and regular rhythm.  Pulmonary:     Effort: Pulmonary effort is normal. No respiratory distress.     Breath sounds: No stridor. Rhonchi present. No wheezing or rales.  Chest:     Chest wall: No tenderness.    Abdominal:     General: There is no distension.  Skin:    General: Skin is dry.  Neurological:     Mental Status: She is alert and oriented to person, place, and time.     Cranial Nerves: No cranial nerve deficit.     Gait: Gait normal.     No results found for this or any previous visit (from the past 72 hour(s)).  No results found.  ASSESSMENT AND PLAN:   Influenza-like illness - Patient with likely flu starting about a week ago and now with a secondary bacterial infection given double sickening.  Will cover with Augmentin  Discharge Instructions   None        The patient is advised to call or return to clinic if she does not see an improvement in symptoms, or to seek the care of the closest emergency department if she  worsens with the above plan.   Philis Fendt, MHS, PA-C 10/05/2018 11:18 AM   Tereasa Coop, PA-C 10/05/18 1118

## 2018-10-10 ENCOUNTER — Ambulatory Visit: Payer: Self-pay | Admitting: Cardiology

## 2018-10-13 ENCOUNTER — Encounter (HOSPITAL_COMMUNITY): Payer: Self-pay

## 2018-10-13 ENCOUNTER — Other Ambulatory Visit: Payer: Self-pay

## 2018-10-13 ENCOUNTER — Inpatient Hospital Stay (HOSPITAL_COMMUNITY)
Admission: AD | Admit: 2018-10-13 | Discharge: 2018-10-16 | DRG: 379 | Disposition: A | Discharge status: Home / Self Care | Payer: Self-pay | Attending: Internal Medicine | Admitting: Internal Medicine

## 2018-10-13 ENCOUNTER — Inpatient Hospital Stay (HOSPITAL_COMMUNITY): Payer: Self-pay

## 2018-10-13 ENCOUNTER — Encounter: Payer: Self-pay | Admitting: *Deleted

## 2018-10-13 DIAGNOSIS — F329 Major depressive disorder, single episode, unspecified: Secondary | ICD-10-CM | POA: Diagnosis present

## 2018-10-13 DIAGNOSIS — G8929 Other chronic pain: Secondary | ICD-10-CM | POA: Diagnosis present

## 2018-10-13 DIAGNOSIS — R1310 Dysphagia, unspecified: Secondary | ICD-10-CM | POA: Diagnosis present

## 2018-10-13 DIAGNOSIS — M549 Dorsalgia, unspecified: Secondary | ICD-10-CM | POA: Diagnosis present

## 2018-10-13 DIAGNOSIS — T39395A Adverse effect of other nonsteroidal anti-inflammatory drugs [NSAID], initial encounter: Secondary | ICD-10-CM | POA: Diagnosis present

## 2018-10-13 DIAGNOSIS — Z8349 Family history of other endocrine, nutritional and metabolic diseases: Secondary | ICD-10-CM

## 2018-10-13 DIAGNOSIS — N83202 Unspecified ovarian cyst, left side: Secondary | ICD-10-CM | POA: Diagnosis present

## 2018-10-13 DIAGNOSIS — F419 Anxiety disorder, unspecified: Secondary | ICD-10-CM | POA: Diagnosis present

## 2018-10-13 DIAGNOSIS — K922 Gastrointestinal hemorrhage, unspecified: Secondary | ICD-10-CM | POA: Diagnosis present

## 2018-10-13 DIAGNOSIS — R52 Pain, unspecified: Secondary | ICD-10-CM

## 2018-10-13 DIAGNOSIS — L732 Hidradenitis suppurativa: Secondary | ICD-10-CM | POA: Diagnosis present

## 2018-10-13 DIAGNOSIS — R000 Tachycardia, unspecified: Secondary | ICD-10-CM | POA: Diagnosis present

## 2018-10-13 DIAGNOSIS — K219 Gastro-esophageal reflux disease without esophagitis: Secondary | ICD-10-CM | POA: Diagnosis present

## 2018-10-13 DIAGNOSIS — Z833 Family history of diabetes mellitus: Secondary | ICD-10-CM

## 2018-10-13 DIAGNOSIS — Z800 Family history of malignant neoplasm of digestive organs: Secondary | ICD-10-CM

## 2018-10-13 DIAGNOSIS — L089 Local infection of the skin and subcutaneous tissue, unspecified: Secondary | ICD-10-CM | POA: Diagnosis present

## 2018-10-13 DIAGNOSIS — Z79899 Other long term (current) drug therapy: Secondary | ICD-10-CM

## 2018-10-13 DIAGNOSIS — N83201 Unspecified ovarian cyst, right side: Secondary | ICD-10-CM | POA: Diagnosis present

## 2018-10-13 DIAGNOSIS — R112 Nausea with vomiting, unspecified: Secondary | ICD-10-CM

## 2018-10-13 DIAGNOSIS — R197 Diarrhea, unspecified: Secondary | ICD-10-CM | POA: Diagnosis present

## 2018-10-13 DIAGNOSIS — R634 Abnormal weight loss: Secondary | ICD-10-CM | POA: Diagnosis present

## 2018-10-13 DIAGNOSIS — Z8249 Family history of ischemic heart disease and other diseases of the circulatory system: Secondary | ICD-10-CM

## 2018-10-13 DIAGNOSIS — K921 Melena: Principal | ICD-10-CM | POA: Diagnosis present

## 2018-10-13 DIAGNOSIS — K319 Disease of stomach and duodenum, unspecified: Secondary | ICD-10-CM | POA: Diagnosis present

## 2018-10-13 DIAGNOSIS — F17210 Nicotine dependence, cigarettes, uncomplicated: Secondary | ICD-10-CM | POA: Diagnosis present

## 2018-10-13 DIAGNOSIS — Z832 Family history of diseases of the blood and blood-forming organs and certain disorders involving the immune mechanism: Secondary | ICD-10-CM

## 2018-10-13 DIAGNOSIS — K2900 Acute gastritis without bleeding: Secondary | ICD-10-CM | POA: Diagnosis present

## 2018-10-13 DIAGNOSIS — R42 Dizziness and giddiness: Secondary | ICD-10-CM | POA: Diagnosis present

## 2018-10-13 DIAGNOSIS — R509 Fever, unspecified: Secondary | ICD-10-CM | POA: Diagnosis not present

## 2018-10-13 LAB — COMPREHENSIVE METABOLIC PANEL
ALK PHOS: 69 U/L (ref 38–126)
ALT: 9 U/L (ref 0–44)
ANION GAP: 9 (ref 5–15)
AST: 18 U/L (ref 15–41)
Albumin: 4 g/dL (ref 3.5–5.0)
BUN: 13 mg/dL (ref 6–20)
CALCIUM: 9.4 mg/dL (ref 8.9–10.3)
CO2: 23 mmol/L (ref 22–32)
Chloride: 103 mmol/L (ref 98–111)
Creatinine, Ser: 0.71 mg/dL (ref 0.44–1.00)
GFR calc Af Amer: >60 mL/min (ref >60)
GFR calc non Af Amer: >60 mL/min (ref >60)
Glucose, Bld: 90 mg/dL (ref 70–99)
Potassium: 4.4 mmol/L (ref 3.5–5.1)
Sodium: 135 mmol/L (ref 135–145)
Total Bilirubin: 0.8 mg/dL (ref 0.3–1.2)
Total Protein: 7.9 g/dL (ref 6.5–8.1)

## 2018-10-13 LAB — URINALYSIS, ROUTINE W REFLEX MICROSCOPIC
Bilirubin Urine: NEGATIVE
Glucose, UA: NEGATIVE mg/dL
Ketones, ur: NEGATIVE mg/dL
Leukocytes, UA: NEGATIVE
Nitrite: NEGATIVE
PROTEIN: NEGATIVE mg/dL
Specific Gravity, Urine: >1.03 — ABNORMAL HIGH (ref 1.005–1.030)
pH: 5.5 (ref 5.0–8.0)

## 2018-10-13 LAB — CBC WITH DIFFERENTIAL/PLATELET
Basophils Absolute: 0 10*3/uL (ref 0.0–0.1)
Basophils Relative: 0 %
Eosinophils Absolute: 0.1 10*3/uL (ref 0.0–0.5)
Eosinophils Relative: 1 %
HCT: 43.1 % (ref 36.0–46.0)
Hemoglobin: 14.3 g/dL (ref 12.0–15.0)
Lymphocytes Relative: 47 %
Lymphs Abs: 3.4 10*3/uL (ref 0.7–4.0)
MCH: 30.7 pg (ref 26.0–34.0)
MCHC: 33.2 g/dL (ref 30.0–36.0)
MCV: 92.5 fL (ref 80.0–100.0)
MONOS PCT: 8 %
Monocytes Absolute: 0.6 10*3/uL (ref 0.1–1.0)
Neutro Abs: 3.2 10*3/uL (ref 1.7–7.7)
Neutrophils Relative %: 44 %
Platelets: 328 10*3/uL (ref 150–400)
RBC: 4.66 MIL/uL (ref 3.87–5.11)
RDW: 12.7 % (ref 11.5–15.5)
WBC: 7.3 10*3/uL (ref 4.0–10.5)
nRBC: 0 % (ref 0.0–0.2)

## 2018-10-13 LAB — URINALYSIS, MICROSCOPIC (REFLEX)

## 2018-10-13 LAB — LIPASE, BLOOD: Lipase: 30 U/L (ref 11–51)

## 2018-10-13 LAB — RAPID URINE DRUG SCREEN, HOSP PERFORMED
Amphetamines: NOT DETECTED
Barbiturates: NOT DETECTED
Benzodiazepines: NOT DETECTED
COCAINE: NOT DETECTED
Opiates: NOT DETECTED
Tetrahydrocannabinol: POSITIVE — AB

## 2018-10-13 LAB — POCT PREGNANCY, URINE: Preg Test, Ur: NEGATIVE

## 2018-10-13 MED ORDER — LACTATED RINGERS IV SOLN
INTRAVENOUS | Status: AC
  Administered 2018-10-14 – 2018-10-15 (×2): via INTRAVENOUS

## 2018-10-13 MED ORDER — KETOROLAC TROMETHAMINE 30 MG/ML IJ SOLN
30.0000 mg | Freq: Once | INTRAMUSCULAR | Status: AC
  Administered 2018-10-13: 30 mg via INTRAVENOUS
  Filled 2018-10-13: qty 1

## 2018-10-13 MED ORDER — SODIUM CHLORIDE 0.9% FLUSH: 3.0000 mL | Freq: Two times a day (BID) | INTRAVENOUS | Status: DC

## 2018-10-13 MED ORDER — ACETAMINOPHEN 500 MG PO TABS
1000.0000 mg | ORAL_TABLET | Freq: Four times a day (QID) | ORAL | Status: DC | PRN
  Administered 2018-10-14 – 2018-10-15 (×3): 1000 mg via ORAL
  Filled 2018-10-13 (×3): qty 2

## 2018-10-13 MED ORDER — PANTOPRAZOLE SODIUM 40 MG IV SOLR
40.0000 mg | Freq: Two times a day (BID) | INTRAVENOUS | Status: DC
  Administered 2018-10-14 – 2018-10-15 (×4): 40 mg via INTRAVENOUS
  Filled 2018-10-13 (×4): qty 40

## 2018-10-13 MED ORDER — DOXYCYCLINE HYCLATE 100 MG PO TABS
100.0000 mg | ORAL_TABLET | Freq: Two times a day (BID) | ORAL | Status: DC
  Administered 2018-10-14 – 2018-10-16 (×6): 100 mg via ORAL
  Filled 2018-10-13 (×6): qty 1

## 2018-10-13 MED ORDER — IOPAMIDOL (ISOVUE-300) INJECTION 61%
100.0000 mL | Freq: Once | INTRAVENOUS | Status: AC | PRN
  Administered 2018-10-13: 100 mL via INTRAVENOUS

## 2018-10-13 MED ORDER — SODIUM CHLORIDE 0.9 % IV BOLUS
1000.0000 mL | Freq: Once | INTRAVENOUS | Status: AC
  Administered 2018-10-13: 1000 mL via INTRAVENOUS

## 2018-10-13 MED ORDER — SODIUM CHLORIDE 0.9 % IV SOLN
Freq: Once | INTRAVENOUS | Status: AC
  Administered 2018-10-13: 21:00:00 via INTRAVENOUS

## 2018-10-13 MED ORDER — IOPAMIDOL (ISOVUE-300) INJECTION 61%
INTRAVENOUS | Status: AC
  Filled 2018-10-13: qty 100

## 2018-10-13 MED ORDER — SODIUM CHLORIDE (PF) 0.9 % IJ SOLN
INTRAMUSCULAR | Status: AC
  Filled 2018-10-13: qty 50

## 2018-10-13 MED ORDER — NICOTINE 14 MG/24HR TD PT24
14.0000 mg | MEDICATED_PATCH | Freq: Every day | TRANSDERMAL | Status: DC
  Administered 2018-10-14 – 2018-10-16 (×3): 14 mg via TRANSDERMAL
  Filled 2018-10-13 (×3): qty 1
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
## 2018-10-13 NOTE — MAU Provider Note (Signed)
History     CSN: [id number]  Arrival date and time: 10/13/18 1428   First Provider Initiated Contact with Patient 10/13/18 1547      Chief Complaint  Patient presents with  . Back Pain  . Emesis  . Diarrhea   HPI   Samantha [name] is a 43 y.o. female non -pregnant here with nausea, vomiting, right sided back pain and weight loss. Says she has not been able to eat anything X 3 weeks and her activity has been decreased. Says her stool has been watery and runny and appears to have blood in it. At times the blood is dark and other times it is red.  The blood in her stool is bright red and frothy today. The patient took a picture of her stool and brought it because she is concerned. Says she has been feeling so week and has not been able to keep anything down. She has been having 3-4 episodes of loose stool daily.  Patient is not sexually active at this time.   She was seen at urgent care last week and was started on antibiotics which she is taking.   OB History    Gravida  2   Para      Term      Preterm      AB  1   Living  1     SAB  1   TAB      Ectopic      Multiple      Live Births              Past Medical History:  Diagnosis Date  . Back pain   . 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 Hydradenitis   . Hyperlipidemia   . TIA (transient ischemic attack) 04/2015   questionable, most likely complicated migraine    Past Surgical History:  Procedure Laterality Date  . CERVICAL BIOPSY  W/ LOOP ELECTRODE EXCISION    . LEEP      Family History  Problem Relation Age of Onset  . Hypertension Mother   . Lupus Mother   . Diabetes Father   . Thyroid disease Sister   . Diabetes Sister   . Cancer Sister        stomach  . Diabetes Maternal Grandmother   . Anesthesia problems Neg Hx   . Other Neg Hx   . Migraines Neg Hx     Social History   Tobacco Use  . Smoking status: Current Every Day Smoker    Packs/day: 1.00    Years: 17.00    Pack years: 17.00     Types: Cigars  . Smokeless tobacco: Never Used  . Tobacco comment: 3 cigarettes a day   Substance Use Topics  . Alcohol use: Yes    Alcohol/week: 1.0 standard drinks    Types: 1 Shots of liquor per week    Comment: drink on the weekends  . Drug use: Yes    Frequency: 3.0 times per week    Types: Marijuana    Allergies: No Known Allergies  Medications Prior to Admission  Medication Sig Dispense Refill Last Dose  . amoxicillin-clavulanate (AUGMENTIN) 875-125 MG tablet Take 1 tablet by mouth 2 (two) times daily for 10 days. Take with food. 20 tablet 0 10/13/2018 at Unknown time  . ibuprofen (ADVIL,MOTRIN) 800 MG tablet Take 1 tablet (800 mg total) by mouth 2 (two) times daily. 21 tablet 0 10/12/2018 at Unknown time  . escitalopram (LEXAPRO) 10 MG  tablet Take 1 tablet (10 mg total) by mouth daily. 30 tablet 0 Unknown at Unknown time   Results for orders placed or performed during the hospital encounter of 10/13/18 (from the past 48 hour(s))  Urinalysis, Routine w reflex microscopic     Status: Abnormal   Collection Time: 10/13/18  2:51 PM  Result Value Ref Range   Color, Urine YELLOW YELLOW   APPearance CLEAR CLEAR   Specific Gravity, Urine >1.030 (H) 1.005 - 1.030   pH 5.5 5.0 - 8.0   Glucose, UA NEGATIVE NEGATIVE mg/dL   Hgb urine dipstick TRACE (A) NEGATIVE   Bilirubin Urine NEGATIVE NEGATIVE   Ketones, ur NEGATIVE NEGATIVE mg/dL   Protein, ur NEGATIVE NEGATIVE mg/dL   Nitrite NEGATIVE NEGATIVE   Leukocytes, UA NEGATIVE NEGATIVE    Comment: Performed at [facility], [address], [place], [place] [address]  Urinalysis, Microscopic (reflex)     Status: Abnormal   Collection Time: 10/13/18  2:51 PM  Result Value Ref Range   RBC / HPF 0-5 0 - 5 RBC/hpf   WBC, UA 0-5 0 - 5 WBC/hpf   Bacteria, UA FEW (A) NONE SEEN   Squamous Epithelial / LPF 0-5 0 - 5   Mucus PRESENT     Comment: Performed at [facility], [address] [place], [place] [address]  Urine rapid drug  screen (hosp performed)     Status: Abnormal   Collection Time: 10/13/18  2:51 PM  Result Value Ref Range   Opiates NONE DETECTED NONE DETECTED   Cocaine NONE DETECTED NONE DETECTED   Benzodiazepines NONE DETECTED NONE DETECTED   Amphetamines NONE DETECTED NONE DETECTED   Tetrahydrocannabinol POSITIVE (A) NONE DETECTED   Barbiturates NONE DETECTED NONE DETECTED    Comment: (NOTE) DRUG SCREEN FOR MEDICAL PURPOSES ONLY.  IF CONFIRMATION IS NEEDED FOR ANY PURPOSE, NOTIFY LAB WITHIN 5 DAYS. LOWEST DETECTABLE LIMITS FOR URINE DRUG SCREEN Drug Class                     Cutoff (ng/mL) Amphetamine and metabolites    1000 Barbiturate and metabolites    200 Benzodiazepine                 696 Tricyclics and metabolites     300 Opiates and metabolites        300 Cocaine and metabolites        300 THC                            50 Performed at [facility], [address], [place], [place] [address]   Pregnancy, urine POC     Status: None   Collection Time: 10/13/18  2:58 PM  Result Value Ref Range   Preg Test, Ur NEGATIVE NEGATIVE    Comment:        THE SENSITIVITY OF THIS METHODOLOGY IS >24 mIU/mL   CBC with Differential     Status: None   Collection Time: 10/13/18  3:29 PM  Result Value Ref Range   WBC 7.3 4.0 - 10.5 K/uL   RBC 4.66 3.87 - 5.11 MIL/uL   Hemoglobin 14.3 12.0 - 15.0 g/dL   HCT 43.1 36.0 - 46.0 %   MCV 92.5 80.0 - 100.0 fL   MCH 30.7 26.0 - 34.0 pg   MCHC 33.2 30.0 - 36.0 g/dL   RDW 12.7 11.5 - 15.5 %   Platelets 328 150 -  400 K/uL   nRBC 0.0 0.0 - 0.2 %   Neutrophils Relative % 44 %   Neutro Abs 3.2 1.7 - 7.7 K/uL   Lymphocytes Relative 47 %   Lymphs Abs 3.4 0.7 - 4.0 K/uL   Monocytes Relative 8 %   Monocytes Absolute 0.6 0.1 - 1.0 K/uL   Eosinophils Relative 1 %   Eosinophils Absolute 0.1 0.0 - 0.5 K/uL   Basophils Relative 0 %   Basophils Absolute 0.0 0.0 - 0.1 K/uL    Comment: Performed at [facility], [address] [place], [place]  [address]  Comprehensive metabolic panel     Status: None   Collection Time: 10/13/18  3:29 PM  Result Value Ref Range   Sodium 135 135 - 145 mmol/L   Potassium 4.4 3.5 - 5.1 mmol/L   Chloride 103 98 - 111 mmol/L   CO2 23 22 - 32 mmol/L   Glucose, Bld 90 70 - 99 mg/dL   BUN 13 6 - 20 mg/dL   Creatinine, Ser 0.71 0.44 - 1.00 mg/dL   Calcium 9.4 8.9 - 10.3 mg/dL   Total Protein 7.9 6.5 - 8.1 g/dL   Albumin 4.0 3.5 - 5.0 g/dL   AST 18 15 - 41 U/L   ALT 9 0 - 44 U/L   Alkaline Phosphatase 69 38 - 126 U/L   Total Bilirubin 0.8 0.3 - 1.2 mg/dL   GFR calc non Af Amer >60 >60 mL/min   GFR calc Af Amer >60 >60 mL/min   Anion gap 9 5 - 15    Comment: Performed at [facility], [place], [place] [address]  Lipase, blood     Status: None   Collection Time: 10/13/18  3:30 PM  Result Value Ref Range   Lipase 30 11 - 51 U/L    Comment: Performed at [facility], [place] [address] [place],  [address]   Dg Abd 2 Views  Result Date: 10/13/2018 CLINICAL DATA:  Nausea and vomiting for 3 weeks EXAM: ABDOMEN - 2 VIEW COMPARISON:  CT abdomen and pelvis 05/04/2018 FINDINGS: Phleboliths in pelvis. Nonobstructive bowel gas pattern. No bowel dilatation or bowel wall thickening. Scattered stool throughout colon. Bones unremarkable. No definite urinary tract calcifications. IMPRESSION: Nonobstructive bowel gas pattern. Electronically Signed   By: Lavonia Dana M.D.   On: 10/13/2018 16:59   Review of Systems  Constitutional: Positive for activity change, appetite change and chills. Negative for fever.  Gastrointestinal: Positive for abdominal pain, anal bleeding, blood in stool, constipation, diarrhea, nausea and vomiting.  Genitourinary: Positive for pelvic pain.   Physical Exam   Blood pressure 122/75, pulse 68, temperature 99 F (37.2 C), temperature source Oral, resp. rate 16, height 5\' 4"  (1.626 m), weight 65.8 kg, last menstrual period 10/10/2018, SpO2 99 %.  Physical Exam   Constitutional: She is oriented to person, place, and time. She appears well-developed and well-nourished. No distress.  HENT:  Head: Normocephalic.  Respiratory: Effort normal.  GI: Soft.  Genitourinary:    Uterus and rectum normal.  Uterus is not enlarged and not tender. Cervix exhibits no motion tenderness. Right adnexum displays no mass, no tenderness and no fullness. Left adnexum displays no mass, no tenderness and no fullness.    Genitourinary Comments: Small amount of bright red blood noted on rectal exam    Musculoskeletal: Normal range of motion.  Neurological: She is alert and oriented to person, place, and time.  Skin: Skin is warm. She  is not diaphoretic.  Psychiatric: Her behavior is normal.    MAU Course  Procedures  None  MDM  Toradol given for pain with significant relief NS bolus  CBC and lipase, CMP Grossly normal Family at the bedside and very concerned about the patient. Discussed patient with GI Dr. Who recommend further evaluation and possible admission.  Discussed patient with Dr. Alvino Chapel at [facility] ED who accepts care of the patient.  Reviewed patient with Dr. Rip Harbour who is agreeable to the plan of care.   Assessment and Plan   A:  1. Weight loss, unintentional   2. Nausea and vomiting   3. Bloody stool   4. Pain     P:  Transfer to [facility] for likely admission  GI is aware; Dr. Cristina Gong  NPO  , Artist Pais, NP 10/14/2018 12:17 PM

## 2018-10-13 NOTE — ED Provider Notes (Signed)
[facility] DEPT Provider Note   CSN: [id number] Arrival date & time: 10/13/18  1428     History   Chief Complaint Chief Complaint  Patient presents with  . Back Pain  . Emesis  . Diarrhea    HPI Samantha [name] is a 43 y.o. female.  The history is provided by the patient and medical records. No language interpreter was used.  Back Pain  Emesis  Associated symptoms: diarrhea   Diarrhea  Associated symptoms: vomiting    Samantha [name] is a 43 y.o. female who presents to the Emergency Department complaining of multiple complaints. She resents as a transfer from women's MA unit for evaluation of feeling poorly. She reports three weeks of multiple complaints with temperature to 100, nausea, cough, occasional emesis. She has decreased oral intake, diarrhea, hematochezia. She has pain in her right lower quadrant radiating to her right back. She denies any dysuria. She feels fatigued and does not want to interact with regular activities. She is having 3 to 4 loose bowel movements daily with a small amount of blood mixed in. They were initially black in color but now brown. She was recently treated with medications for flu and hydradenitis, prescribed fulling urgent care visit on 1/26. She states she has three pills remaining. Past Medical History:  Diagnosis Date  . Back pain   . 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 Hydradenitis   . Hyperlipidemia   . TIA (transient ischemic attack) 04/2015   questionable, most likely complicated migraine    Patient Active Problem List   Diagnosis Date Noted  . Amenorrhea 09/17/2017  . Adjustment disorder with mixed anxiety and depressed mood 06/26/2016  . Depressed mood 06/26/2016  . Back pain 01/31/2016  . 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 Acute pericarditis 12/26/2015  . Hidradenitis suppurativa 11/07/2015  . 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 Sinusitis 10/20/2015  . Complicated migraine 95/28/4132  . Marijuana  use 10/20/2015  . Smoker 05/04/2015  . TIA (transient ischemic attack) 05/04/2015  . Palpitations 05/04/2015  . Decreased appetite 05/04/2015  . 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 Hyperlipidemia 04/22/2015  . TOA (tubo-ovarian abscess) 11/23/2012  . History of abnormal Pap smear 12/20/2011  . History of PID 12/20/2011    Past Surgical History:  Procedure Laterality Date  . CERVICAL BIOPSY  W/ LOOP ELECTRODE EXCISION    . LEEP       OB History    Gravida  2   Para      Term      Preterm      AB  1   Living  1     SAB  1   TAB      Ectopic      Multiple      Live Births               Home Medications    Prior to Admission medications   Medication Sig Start Date End Date Taking? Authorizing Provider  amoxicillin-clavulanate (AUGMENTIN) 875-125 MG tablet Take 1 tablet by mouth 2 (two) times daily for 10 days. Take with food. 10/05/18 10/15/18 Yes Tereasa Coop, PA-C  escitalopram (LEXAPRO) 10 MG tablet Take 1 tablet (10 mg total) by mouth daily. 11/12/17  Yes Riccio, Angela C, DO  ibuprofen (ADVIL,MOTRIN) 800 MG tablet Take 1 tablet (800 mg total) by mouth 2 (two) times daily. Patient taking differently: Take 800 mg by mouth 2 (two) times daily as needed for mild pain.  10/05/18  Yes Tereasa Coop, PA-C    Family History Family History  Problem Relation Age of Onset  . Hypertension Mother   . Lupus Mother   . Diabetes Father   . Thyroid disease Sister   . Diabetes Sister   . Cancer Sister        stomach  . Diabetes Maternal Grandmother   . Anesthesia problems Neg Hx   . Other Neg Hx   . Migraines Neg Hx     Social History Social History   Tobacco Use  . Smoking status: Current Every Day Smoker    Packs/day: 1.00    Years: 17.00    Pack years: 17.00    Types: Cigars  . Smokeless tobacco: Never Used  . Tobacco comment: 3 cigarettes a day   Substance Use Topics  . Alcohol use: Yes    Alcohol/week: 1.0 standard drinks    Types: 1  Shots of liquor per week    Comment: drink on the weekends  . Drug use: Yes    Frequency: 3.0 times per week    Types: Marijuana     Allergies   Patient has no known allergies.   Review of Systems Review of Systems  Gastrointestinal: Positive for diarrhea and vomiting.  Musculoskeletal: Positive for back pain.  All other systems reviewed and are negative.    Physical Exam Updated Vital Signs BP 124/80   Pulse 72   Temp 99 F (37.2 C) (Oral)   Resp 16   Ht 5\' 4"  (1.626 m)   Wt 65.8 kg   LMP 10/10/2018   SpO2 97%   BMI 24.90 kg/m   Physical Exam Vitals signs and nursing note reviewed.  Constitutional:      Appearance: She is well-developed.  HENT:     Head: Normocephalic and atraumatic.  Cardiovascular:     Rate and Rhythm: Normal rate and regular rhythm.     Heart sounds: No murmur.  Pulmonary:     Effort: Pulmonary effort is normal. No respiratory distress.     Breath sounds: Normal breath sounds.  Abdominal:     Palpations: Abdomen is soft.     Tenderness: There is no guarding or rebound.     Comments: Mild RLQ tenderness  Musculoskeletal:        General: No swelling or tenderness.  Skin:    General: Skin is warm and dry.  Neurological:     Mental Status: She is alert and oriented to person, place, and time.  Psychiatric:        Mood and Affect: Mood normal.        Behavior: Behavior normal.      ED Treatments / Results  Labs (all labs ordered are listed, but only abnormal results are displayed) Labs Reviewed  URINALYSIS, ROUTINE W REFLEX MICROSCOPIC - Abnormal; Notable for the following components:      Result Value   Specific Gravity, Urine >1.030 (*)    Hgb urine dipstick TRACE (*)    All other components within normal limits  URINALYSIS, MICROSCOPIC (REFLEX) - Abnormal; Notable for the following components:   Bacteria, UA FEW (*)    All other components within normal limits  RAPID URINE DRUG SCREEN, HOSP PERFORMED - Abnormal; Notable for  the following components:   Tetrahydrocannabinol POSITIVE (*)    All other components within normal limits  CBC WITH DIFFERENTIAL/PLATELET  COMPREHENSIVE METABOLIC PANEL  LIPASE, BLOOD  HIV ANTIBODY (ROUTINE TESTING W REFLEX)  POCT PREGNANCY, URINE  EKG None  Radiology Dg Abd 2 Views  Result Date: 10/13/2018 CLINICAL DATA:  Nausea and vomiting for 3 weeks EXAM: ABDOMEN - 2 VIEW COMPARISON:  CT abdomen and pelvis 05/04/2018 FINDINGS: Phleboliths in pelvis. Nonobstructive bowel gas pattern. No bowel dilatation or bowel wall thickening. Scattered stool throughout colon. Bones unremarkable. No definite urinary tract calcifications. IMPRESSION: Nonobstructive bowel gas pattern. Electronically Signed   By: Lavonia Dana M.D.   On: 10/13/2018 16:59    Procedures Procedures (including critical care time)  Medications Ordered in ED Medications  sodium chloride 0.9 % bolus 1,000 mL (0 mLs Intravenous Stopped 10/13/18 1937)  ketorolac (TORADOL) 30 MG/ML injection 30 mg (30 mg Intravenous Given 10/13/18 1653)     Initial Impression / Assessment and Plan / ED Course  I have reviewed the triage vital signs and the nursing notes.  Pertinent labs & imaging results that were available during my care of the patient were reviewed by me and considered in my medical decision making (see chart for details).     Patient transferred from women's MAU for evaluation of three weeks of back pain, diarrhea hematochezia, vomiting. She is non-toxic appearing on evaluation. She does have mild right lower quadrant tenderness without peritoneal findings. Given weight loss, hematochezia medicine consulted for admission for further evaluation.  Final Clinical Impressions(s) / ED Diagnoses   Final diagnoses:  Weight loss, unintentional  Bloody stool    ED Discharge Orders    None       Quintella Reichert, MD 10/13/18 2326

## 2018-10-13 NOTE — ED Notes (Signed)
ED TO INPATIENT HANDOFF REPORT  Name/Age/Gender Samantha [name] 43 y.o. female  Code Status Code Status History    Date Active Date Inactive Code Status Order ID Comments User Context   10/20/2015 0058 10/20/2015 1832 Full Code [id number]  Juluis Mire, MD ED   04/21/2015 1952 04/22/2015 2136 Full Code [id number]  Francesca Oman, DO Inpatient   11/23/2012 0136 11/29/2012 1633 Full Code [id number]  Ashley Murrain, NP Inpatient      Home/SNF/Other Home  Chief Complaint VOMITING,BK PN  Level of Care/Admitting Diagnosis ED Disposition    ED Disposition Condition Comment   [place]: [facility] [[id number]]  Level of Care: Med-Surg [16]  Diagnosis: GIB (gastrointestinal bleeding) [536644]  Admitting Physician: Vilma Prader [[id number]]  Attending Physician: Vilma Prader [[id number]]  PT Class (Do Not Modify): Observation [104]  PT Acc Code (Do Not Modify): Observation [10022]       Medical History Past Medical History:  Diagnosis Date  . Back pain   . 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 Hydradenitis   . Hyperlipidemia   . TIA (transient ischemic attack) 04/2015   questionable, most likely complicated migraine    Allergies No Known Allergies  IV Location/Drains/Wounds Patient Lines/Drains/Airways Status   Active Line/Drains/Airways    Name:   Placement date:   Placement time:   Site:   Days:   Peripheral IV 10/13/18 Anterior;Right Hand   10/13/18    1540    Hand   less than 1          Labs/Imaging Results for orders placed or performed during the hospital encounter of 10/13/18 (from the past 48 hour(s))  Urinalysis, Routine w reflex microscopic     Status: Abnormal   Collection Time: 10/13/18  2:51 PM  Result Value Ref Range   Color, Urine YELLOW YELLOW   APPearance CLEAR CLEAR   Specific Gravity, Urine >1.030 (H) 1.005 - 1.030   pH 5.5 5.0 - 8.0   Glucose, UA NEGATIVE NEGATIVE mg/dL   Hgb urine dipstick TRACE (A) NEGATIVE   Bilirubin Urine  NEGATIVE NEGATIVE   Ketones, ur NEGATIVE NEGATIVE mg/dL   Protein, ur NEGATIVE NEGATIVE mg/dL   Nitrite NEGATIVE NEGATIVE   Leukocytes, UA NEGATIVE NEGATIVE    Comment: Performed at [facility], [address], [place], [place] [address]  Urinalysis, Microscopic (reflex)     Status: Abnormal   Collection Time: 10/13/18  2:51 PM  Result Value Ref Range   RBC / HPF 0-5 0 - 5 RBC/hpf   WBC, UA 0-5 0 - 5 WBC/hpf   Bacteria, UA FEW (A) NONE SEEN   Squamous Epithelial / LPF 0-5 0 - 5   Mucus PRESENT     Comment: Performed at [facility], [address], [place], [place] [address]  Urine rapid drug screen (hosp performed)     Status: Abnormal   Collection Time: 10/13/18  2:51 PM  Result Value Ref Range   Opiates NONE DETECTED NONE DETECTED   Cocaine NONE DETECTED NONE DETECTED   Benzodiazepines NONE DETECTED NONE DETECTED   Amphetamines NONE DETECTED NONE DETECTED   Tetrahydrocannabinol POSITIVE (A) NONE DETECTED   Barbiturates NONE DETECTED NONE DETECTED    Comment: (NOTE) DRUG SCREEN FOR MEDICAL PURPOSES ONLY.  IF CONFIRMATION IS NEEDED FOR ANY PURPOSE, NOTIFY LAB WITHIN 5 DAYS. LOWEST DETECTABLE LIMITS FOR URINE DRUG SCREEN Drug Class  Cutoff (ng/mL) Amphetamine and metabolites    1000 Barbiturate and metabolites    200 Benzodiazepine                 009 Tricyclics and metabolites     300 Opiates and metabolites        300 Cocaine and metabolites        300 THC                            50 Performed at [facility], [address] [place], [place] [address]   Pregnancy, urine POC     Status: None   Collection Time: 10/13/18  2:58 PM  Result Value Ref Range   Preg Test, Ur NEGATIVE NEGATIVE    Comment:        THE SENSITIVITY OF THIS METHODOLOGY IS >24 mIU/mL   CBC with Differential     Status: None   Collection Time: 10/13/18  3:29 PM  Result Value Ref Range   WBC 7.3 4.0 - 10.5 K/uL   RBC 4.66 3.87 - 5.11 MIL/uL   Hemoglobin 14.3  12.0 - 15.0 g/dL   HCT 43.1 36.0 - 46.0 %   MCV 92.5 80.0 - 100.0 fL   MCH 30.7 26.0 - 34.0 pg   MCHC 33.2 30.0 - 36.0 g/dL   RDW 12.7 11.5 - 15.5 %   Platelets 328 150 - 400 K/uL   nRBC 0.0 0.0 - 0.2 %   Neutrophils Relative % 44 %   Neutro Abs 3.2 1.7 - 7.7 K/uL   Lymphocytes Relative 47 %   Lymphs Abs 3.4 0.7 - 4.0 K/uL   Monocytes Relative 8 %   Monocytes Absolute 0.6 0.1 - 1.0 K/uL   Eosinophils Relative 1 %   Eosinophils Absolute 0.1 0.0 - 0.5 K/uL   Basophils Relative 0 %   Basophils Absolute 0.0 0.0 - 0.1 K/uL    Comment: Performed at [facility], [address] [place], [place] [address]  Comprehensive metabolic panel     Status: None   Collection Time: 10/13/18  3:29 PM  Result Value Ref Range   Sodium 135 135 - 145 mmol/L   Potassium 4.4 3.5 - 5.1 mmol/L   Chloride 103 98 - 111 mmol/L   CO2 23 22 - 32 mmol/L   Glucose, Bld 90 70 - 99 mg/dL   BUN 13 6 - 20 mg/dL   Creatinine, Ser 0.71 0.44 - 1.00 mg/dL   Calcium 9.4 8.9 - 10.3 mg/dL   Total Protein 7.9 6.5 - 8.1 g/dL   Albumin 4.0 3.5 - 5.0 g/dL   AST 18 15 - 41 U/L   ALT 9 0 - 44 U/L   Alkaline Phosphatase 69 38 - 126 U/L   Total Bilirubin 0.8 0.3 - 1.2 mg/dL   GFR calc non Af Amer >60 >60 mL/min   GFR calc Af Amer >60 >60 mL/min   Anion gap 9 5 - 15    Comment: Performed at [facility], [place], [place] [address]  Lipase, blood     Status: None   Collection Time: 10/13/18  3:30 PM  Result Value Ref Range   Lipase 30 11 - 51 U/L    Comment: Performed at [facility], [address] [place], [place] [address]   Ct Abdomen Pelvis W Contrast  Result Date: 10/13/2018 CLINICAL DATA:  43 year old female with 3 weeks of progressive  abdominal pain, nausea vomiting. Chronic back pain. Recently diagnosed with pneumonia and started on Augmentin. EXAM: CT ABDOMEN AND PELVIS WITH CONTRAST TECHNIQUE: Multidetector CT imaging of the abdomen and pelvis was performed using the standard  protocol following bolus administration of intravenous contrast. CONTRAST:  15mL ISOVUE-300 IOPAMIDOL (ISOVUE-300) INJECTION 61% COMPARISON:  Noncontrast CT Abdomen and Pelvis 05/04/2018. FINDINGS: Lower chest: The visible lung bases are stable and negative. No pleural or pericardial effusion. Hepatobiliary: Negative liver and gallbladder. Pancreas: Negative. Spleen: Negative. Adrenals/Urinary Tract: Normal adrenal glands. Bilateral renal enhancement and contrast excretion is symmetric and normal. Normal proximal ureters. Decompressed urinary bladder. Chronic pelvic phleboliths incidentally noted. Stomach/Bowel: Negative rectosigmoid colon. Negative descending colon. Retained stool in the proximal transverse colon and right colon which are redundant. Normal appendix (coronal image 32). No large bowel inflammation. Negative terminal ileum. No dilated small bowel. No mesenteric inflammatory stranding identified. There is a small chronic metallic clip or foreign body at the gastric cardia which is unchanged. Otherwise negative stomach. Negative duodenum. No free air, free fluid. Vascular/Lymphatic: Major arterial structures are patent. There is right iliac artery atherosclerosis. Portal venous system appears grossly patent. No lymphadenopathy. Reproductive: Negative uterus, retroverted. Low-density bilateral ovarian cysts larger on the right (series 2, image 63), but smaller than on the 2019 comparison. Other: No pelvic free fluid. Musculoskeletal: No acute osseous abnormality and largely unremarkable for age. IMPRESSION: 1. Bilateral ovarian cysts, larger on the right but smaller than on a 2019 CT. 2. No acute or inflammatory process identified in the abdomen or pelvis. Normal appendix. 3. Visible lung bases are negative. Electronically Signed   By: Genevie Ann M.D.   On: 10/13/2018 22:40   Dg Abd 2 Views  Result Date: 10/13/2018 CLINICAL DATA:  Nausea and vomiting for 3 weeks EXAM: ABDOMEN - 2 VIEW COMPARISON:  CT  abdomen and pelvis 05/04/2018 FINDINGS: Phleboliths in pelvis. Nonobstructive bowel gas pattern. No bowel dilatation or bowel wall thickening. Scattered stool throughout colon. Bones unremarkable. No definite urinary tract calcifications. IMPRESSION: Nonobstructive bowel gas pattern. Electronically Signed   By: Lavonia Dana M.D.   On: 10/13/2018 16:59   None  Pending Labs Unresulted Labs (From admission, onward)    Start     Ordered   10/13/18 2039  Gastrointestinal Panel by PCR , Stool  (Gastrointestinal Panel by PCR, Stool)  Once,   R     10/13/18 2042   10/13/18 1518  HIV Antibody (routine testing w rflx)  Once,   STAT     10/13/18 1517   Signed and Held  CBC  Tomorrow morning,   R    Question:  Specimen collection method  Answer:  Unit=Unit collect   Signed and Held   Signed and Held  Protime-INR  Tomorrow morning,   R    Question:  Specimen collection method  Answer:  Unit=Unit collect   Signed and Held   Signed and Held  Basic metabolic panel  Tomorrow morning,   R    Question:  Specimen collection method  Answer:  Unit=Unit collect   Signed and Held   Signed and Held  Type and screen [place]  Once,   R    Comments:  [place]    Signed and Held          Vitals/Pain Today's Vitals   10/13/18 1936 10/13/18 1938 10/13/18 2130 10/13/18 2300  BP: 124/80  122/75 111/81  Pulse: 72  68 67  Resp: 16  16 16  Temp:    98.7 F (37.1 C)  TempSrc:    Oral  SpO2: 97%  99% 97%  Weight:  65.8 kg    Height:  5\' 4"  (1.626 m)    PainSc:  8   9     Isolation Precautions Enteric precautions (UV disinfection)  Medications Medications  iopamidol (ISOVUE-300) 61 % injection (has no administration in time range)  sodium chloride (PF) 0.9 % injection (has no administration in time range)  sodium chloride 0.9 % bolus 1,000 mL (0 mLs Intravenous Stopped 10/13/18 1937)  ketorolac (TORADOL) 30 MG/ML injection 30 mg (30 mg Intravenous Given 10/13/18  1653)  0.9 %  sodium chloride infusion ( Intravenous New Bag/Given 10/13/18 2051)  iopamidol (ISOVUE-300) 61 % injection 100 mL (100 mLs Intravenous Contrast Given 10/13/18 2218)    Mobility walks

## 2018-10-13 NOTE — ED Notes (Signed)
Bed: UT65 Expected date:  Expected time:  Means of arrival:  Comments: MAU transfer

## 2018-10-13 NOTE — ED Triage Notes (Signed)
Pt brought over from MAU via [place]. Pt has chronic back pain. Pt has been having N/V and generalized weakness x3 weeks. Pt reports being diagnosed with pneumonia last week and started on Augmentin. Pt is not pregnant.   22G RH 1L NS and 30mg  Toradol given at MAU

## 2018-10-13 NOTE — MAU Note (Signed)
Hasn't been able to lately, for about 3 wks.  Has been having pain in her rt side of back "for a while now, some months", around to the front. "Some days is ok, somedays is worser".  Has been nauseated.  Has been having diarrhea for about 3 wks, black stools.  Fever - 99.

## 2018-10-13 NOTE — ED Notes (Signed)
Gave report to Bishnu, RN for room 1539.

## 2018-10-13 NOTE — ED Notes (Signed)
Hospitalist at bedside 

## 2018-10-14 ENCOUNTER — Observation Stay (HOSPITAL_COMMUNITY): Payer: Self-pay | Admitting: Anesthesiology

## 2018-10-14 ENCOUNTER — Observation Stay (HOSPITAL_COMMUNITY): Payer: Self-pay

## 2018-10-14 ENCOUNTER — Encounter (HOSPITAL_COMMUNITY)
Admission: AD | Disposition: A | Discharge status: Home / Self Care | Payer: Self-pay | Source: Home / Self Care | Attending: Internal Medicine

## 2018-10-14 ENCOUNTER — Encounter (HOSPITAL_COMMUNITY): Payer: Self-pay | Admitting: Certified Registered Nurse Anesthetist

## 2018-10-14 DIAGNOSIS — K2901 Acute gastritis with bleeding: Secondary | ICD-10-CM

## 2018-10-14 DIAGNOSIS — F502 Bulimia nervosa: Secondary | ICD-10-CM

## 2018-10-14 HISTORY — PX: ESOPHAGOGASTRODUODENOSCOPY (EGD) WITH PROPOFOL: SHX5813

## 2018-10-14 LAB — BASIC METABOLIC PANEL
ANION GAP: 6 (ref 5–15)
BUN: 14 mg/dL (ref 6–20)
CO2: 23 mmol/L (ref 22–32)
Calcium: 8.4 mg/dL — ABNORMAL LOW (ref 8.9–10.3)
Chloride: 107 mmol/L (ref 98–111)
Creatinine, Ser: 0.73 mg/dL (ref 0.44–1.00)
GFR calc Af Amer: >60 mL/min (ref >60)
GFR calc non Af Amer: >60 mL/min (ref >60)
Glucose, Bld: 81 mg/dL (ref 70–99)
Potassium: 3.8 mmol/L (ref 3.5–5.1)
Sodium: 136 mmol/L (ref 135–145)

## 2018-10-14 LAB — GASTROINTESTINAL PANEL BY PCR, STOOL (REPLACES STOOL CULTURE)

## 2018-10-14 LAB — CBC
HCT: 39 % (ref 36.0–46.0)
HEMOGLOBIN: 12.3 g/dL (ref 12.0–15.0)
MCH: 29.4 pg (ref 26.0–34.0)
MCHC: 31.5 g/dL (ref 30.0–36.0)
MCV: 93.1 fL (ref 80.0–100.0)
NRBC: 0 % (ref 0.0–0.2)
Platelets: 285 10*3/uL (ref 150–400)
RBC: 4.19 MIL/uL (ref 3.87–5.11)
RDW: 12.3 % (ref 11.5–15.5)
WBC: 6.5 10*3/uL (ref 4.0–10.5)

## 2018-10-14 LAB — TYPE AND SCREEN
ABO/RH(D): A POS
Antibody Screen: NEGATIVE

## 2018-10-14 LAB — PROTIME-INR
INR: 0.97
Prothrombin Time: 12.8 seconds (ref 11.4–15.2)

## 2018-10-14 LAB — ABO/RH: ABO/RH(D): A POS

## 2018-10-14 LAB — HIV ANTIBODY (ROUTINE TESTING W REFLEX): HIV Screen 4th Generation wRfx: NONREACTIVE

## 2018-10-14 SURGERY — ESOPHAGOGASTRODUODENOSCOPY (EGD) WITH PROPOFOL
Anesthesia: Monitor Anesthesia Care

## 2018-10-14 MED ORDER — PROPOFOL 500 MG/50ML IV EMUL
INTRAVENOUS | Status: DC | PRN
  Administered 2018-10-14: 150 ug/kg/min via INTRAVENOUS

## 2018-10-14 MED ORDER — HYOSCYAMINE SULFATE 0.125 MG SL SUBL
0.2500 mg | SUBLINGUAL_TABLET | Freq: Once | SUBLINGUAL | Status: AC
  Administered 2018-10-14: 0.25 mg via SUBLINGUAL
  Filled 2018-10-14: qty 2

## 2018-10-14 MED ORDER — LIDOCAINE 2% (20 MG/ML) 5 ML SYRINGE
INTRAMUSCULAR | Status: DC | PRN
  Administered 2018-10-14: 80 mg via INTRAVENOUS

## 2018-10-14 MED ORDER — PROPOFOL 10 MG/ML IV BOLUS
INTRAVENOUS | Status: DC | PRN
  Administered 2018-10-14: 20 mg via INTRAVENOUS
  Administered 2018-10-14 (×2): 40 mg via INTRAVENOUS

## 2018-10-14 MED ORDER — ONDANSETRON HCL 4 MG/2ML IJ SOLN
4.0000 mg | Freq: Four times a day (QID) | INTRAMUSCULAR | Status: DC | PRN
  Administered 2018-10-14 – 2018-10-15 (×2): 4 mg via INTRAVENOUS
  Filled 2018-10-14 (×2): qty 2

## 2018-10-14 SURGICAL SUPPLY — 15 items

## 2018-10-14 NOTE — H&P (View-Only) (Signed)
Referring Provider: Dr. Stana Bunting Primary Care Physician:  Patient, No Pcp Per Primary Gastroenterologist:  Althia Forts  Reason for Consultation:  GI bleed  HPI: Samantha [name] is a 43 y.o. female with 2-3 weeks of black tarry stools several times per day and coffee grounds emesis in the setting of chronic NSAID use (Motrin, Ibuprofen) that she takes multiple times per day for chronic back pain. Reports occasional red color to her stools. Has been having black loose stools for the last few weeks. Having right flank pain. Occasional heartburn. Occasional dysphagia. 6 pound unintentional weight loss in the past week. Denies having an EGD in the past. Occasional alcohol. Hgb 12.3. GI pathogen panel negative.  Past Medical History:  Diagnosis Date  . Back pain   . 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 Hydradenitis   . Hyperlipidemia   . TIA (transient ischemic attack) 04/2015   questionable, most likely complicated migraine    Past Surgical History:  Procedure Laterality Date  . CERVICAL BIOPSY  W/ LOOP ELECTRODE EXCISION    . LEEP      Prior to Admission medications   Medication Sig Start Date End Date Taking? Authorizing Provider  amoxicillin-clavulanate (AUGMENTIN) 875-125 MG tablet Take 1 tablet by mouth 2 (two) times daily for 10 days. Take with food. 10/05/18 10/15/18 Yes Tereasa Coop, PA-C  escitalopram (LEXAPRO) 10 MG tablet Take 1 tablet (10 mg total) by mouth daily. 11/12/17  Yes Riccio, Angela C, DO  ibuprofen (ADVIL,MOTRIN) 800 MG tablet Take 1 tablet (800 mg total) by mouth 2 (two) times daily. Patient taking differently: Take 800 mg by mouth 2 (two) times daily as needed for mild pain.  10/05/18  Yes Tereasa Coop, PA-C    Scheduled Meds: . doxycycline  100 mg Oral Q12H  . nicotine  14 mg Transdermal Daily  . pantoprazole (PROTONIX) IV  40 mg Intravenous Q12H  . sodium chloride flush  3 mL Intravenous Q12H   Continuous Infusions: . [START ON 10/15/2018] lactated ringers     PRN  Meds:.acetaminophen, ondansetron (ZOFRAN) IV  Allergies as of 10/13/2018  . (No Known Allergies)    Family History  Problem Relation Age of Onset  . Hypertension Mother   . Lupus Mother   . Diabetes Father   . Thyroid disease Sister   . Diabetes Sister   . Cancer Sister        stomach  . Diabetes Maternal Grandmother   . Anesthesia problems Neg Hx   . Other Neg Hx   . Migraines Neg Hx     Social History   Socioeconomic History  . Marital status: Single    Spouse name: Not on file  . Number of children: 1  . Years of education: 67  . Highest education level: Not on file  Occupational History  . Not on file  Social Needs  . Financial resource strain: Not on file  . Food insecurity:    Worry: Not on file    Inability: Not on file  . Transportation needs:    Medical: Not on file    Non-medical: Not on file  Tobacco Use  . Smoking status: Current Every Day Smoker    Packs/day: 1.00    Years: 17.00    Pack years: 17.00    Types: Cigars  . Smokeless tobacco: Never Used  . Tobacco comment: 3 cigarettes a day   Substance and Sexual Activity  . Alcohol use: Yes    Alcohol/week: 1.0 standard drinks  Types: 1 Shots of liquor per week    Comment: drink on the weekends  . Drug use: Yes    Frequency: 3.0 times per week    Types: Marijuana  . Sexual activity: Not Currently    Birth control/protection: None  Lifestyle  . Physical activity:    Days per week: 7 days    Minutes per session: 30 min  . Stress: Rather much  Relationships  . Social connections:    Talks on phone: Three times a week    Gets together: More than three times a week    Attends religious service: 1 to 4 times per year    Active member of club or organization: No    Attends meetings of clubs or organizations: Never    Relationship status: Never married  . Intimate partner violence:    Fear of current or ex partner: No    Emotionally abused: No    Physically abused: No    Forced sexual  activity: No  Other Topics Concern  . Not on file  Social History Narrative   Patient drinks sodas all day long.   Patient is right handed.     Review of Systems: All negative except as stated above in HPI.  Physical Exam: Vital signs: Vitals:   10/14/18 0020 10/14/18 0546  BP: 123/88 98/72  Pulse: 63 64  Resp: 18 16  Temp: 99.4 F (37.4 C) 98.5 F (36.9 C)  SpO2: 98% 98%   Last BM Date: 10/13/18 General:   Somnolent, Well-developed, well-nourished, no acute distress Head: normocephalic, atraumatic Eyes: anicteric sclera ENT: oropharynx clear Neck: supple, nontender Lungs:  Expiratory wheezing, coarse breath sounds. No acute distress Heart:  Regular rate and rhythm; no murmurs, clicks, rubs,  or gallops. Abdomen: soft, nontender, nondistended, +BS  Rectal:  Deferred Ext: no edema  GI:  Lab Results: Recent Labs    10/13/18 1529 10/14/18 0448  WBC 7.3 6.5  HGB 14.3 12.3  HCT 43.1 39.0  PLT 328 285   BMET Recent Labs    10/13/18 1529 10/14/18 0448  NA 135 136  K 4.4 3.8  CL 103 107  CO2 23 23  GLUCOSE 90 81  BUN 13 14  CREATININE 0.71 0.73  CALCIUM 9.4 8.4*   LFT Recent Labs    10/13/18 1529  PROT 7.9  ALBUMIN 4.0  AST 18  ALT 9  ALKPHOS 69  BILITOT 0.8   PT/INR Recent Labs    10/14/18 0448  LABPROT 12.8  INR 0.97     Studies/Results: Ct Abdomen Pelvis W Contrast  Result Date: 10/13/2018 CLINICAL DATA:  43 year old female with 3 weeks of progressive abdominal pain, nausea vomiting. Chronic back pain. Recently diagnosed with pneumonia and started on Augmentin. EXAM: CT ABDOMEN AND PELVIS WITH CONTRAST TECHNIQUE: Multidetector CT imaging of the abdomen and pelvis was performed using the standard protocol following bolus administration of intravenous contrast. CONTRAST:  169mL ISOVUE-300 IOPAMIDOL (ISOVUE-300) INJECTION 61% COMPARISON:  Noncontrast CT Abdomen and Pelvis 05/04/2018. FINDINGS: Lower chest: The visible lung bases are stable and  negative. No pleural or pericardial effusion. Hepatobiliary: Negative liver and gallbladder. Pancreas: Negative. Spleen: Negative. Adrenals/Urinary Tract: Normal adrenal glands. Bilateral renal enhancement and contrast excretion is symmetric and normal. Normal proximal ureters. Decompressed urinary bladder. Chronic pelvic phleboliths incidentally noted. Stomach/Bowel: Negative rectosigmoid colon. Negative descending colon. Retained stool in the proximal transverse colon and right colon which are redundant. Normal appendix (coronal image 32). No large bowel inflammation. Negative terminal ileum.  No dilated small bowel. No mesenteric inflammatory stranding identified. There is a small chronic metallic clip or foreign body at the gastric cardia which is unchanged. Otherwise negative stomach. Negative duodenum. No free air, free fluid. Vascular/Lymphatic: Major arterial structures are patent. There is right iliac artery atherosclerosis. Portal venous system appears grossly patent. No lymphadenopathy. Reproductive: Negative uterus, retroverted. Low-density bilateral ovarian cysts larger on the right (series 2, image 63), but smaller than on the 2019 comparison. Other: No pelvic free fluid. Musculoskeletal: No acute osseous abnormality and largely unremarkable for age. IMPRESSION: 1. Bilateral ovarian cysts, larger on the right but smaller than on a 2019 CT. 2. No acute or inflammatory process identified in the abdomen or pelvis. Normal appendix. 3. Visible lung bases are negative. Electronically Signed   By: Genevie Ann M.D.   On: 10/13/2018 22:40   Dg Abd 2 Views  Result Date: 10/13/2018 CLINICAL DATA:  Nausea and vomiting for 3 weeks EXAM: ABDOMEN - 2 VIEW COMPARISON:  CT abdomen and pelvis 05/04/2018 FINDINGS: Phleboliths in pelvis. Nonobstructive bowel gas pattern. No bowel dilatation or bowel wall thickening. Scattered stool throughout colon. Bones unremarkable. No definite urinary tract calcifications. IMPRESSION:  Nonobstructive bowel gas pattern. Electronically Signed   By: Lavonia Dana M.D.   On: 10/13/2018 16:59   [place] Soft Tissue Non Vascular  Result Date: 10/14/2018 CLINICAL DATA:  Chronic left axillary pain. EXAM: ULTRASOUND LEFT UPPER EXTREMITY LIMITED TECHNIQUE: Ultrasound examination of the upper extremity soft tissues was performed in the area of clinical concern. COMPARISON:  None FINDINGS: There are 6 solid inhomogeneous hypoechoic well-defined nodules in the subcutaneous fat in the left axilla. The largest measures 2.5 x 2.1 x 0.5 cm. There is a single 2.0 x 1.3 x 0.8 cm lymph node with a benign fatty hilum. The other nodules do not have fatty hila. One of the nodules has slight increased vascularity. The underlying muscle appears normal. IMPRESSION: Multiple well-defined soft tissue nodules in the left axilla, not consistent with benign reactive lymph nodes. The nodules are nonspecific. Needle biopsy or CT scan of the chest with contrast to look for other nodules should be considered. Electronically Signed   By: Lorriane Shire M.D.   On: 10/14/2018 08:38    Impression/Plan: GI bleed with melenic stools and coffee grounds emesis in the setting of chronic NSAIDs concerning for a peptic ulcer bleed. EGD today to further evaluate. NPO for EGD. Continue Protonix 40 mg IV Q 12 hours pending EGD findings. Risks/benefits of EGD discussed with patient and she agrees to proceed.    LOS: 0 days   [name]  10/14/2018, 11:06 AM  Questions please call [phone]

## 2018-10-14 NOTE — Progress Notes (Signed)
Initial Nutrition Assessment  DOCUMENTATION CODES:   Not applicable  INTERVENTION:   Ensure Enlive po BID, each supplement provides 350 kcal and 20 grams of protein once diet advanced.   NUTRITION DIAGNOSIS:   Inadequate oral intake related to vomiting, nausea as evidenced by estimated needs.  GOAL:   Patient will meet greater than or equal to 90% of their needs  MONITOR:   PO intake, Supplement acceptance, Diet advancement, Weight trends, Labs, I & O's  REASON FOR ASSESSMENT:   Malnutrition Screening Tool   ASSESSMENT:   Patient with PMH significant for tobacco abuse, anxiety/depression, TIA, HLD, and hidadenitis suppurativa. Presents this admission with suspected upper GI bleed from chronic NSAID use.    Pt for EGD today.   Endorses loss in appetite x 3 weeks PTA. States she consumed 1 meal daily that consisted of pizza, fries, fried chicken, and cabbage but often vomited immediatly after eating. Over the last week she bought Ensure and tried to drink them TID. Denies vomiting after drinking Ensure. Pt currently NPO for EGD. Amenable to Ensure once diet advanced.   Pt reports a UBW of 153 lb and a weight loss of 7 lb within the last three weeks. Records indicate pt weighed 150 lb on 1/17 and 145 lb this admission (3.3% wt loss in three weeks, insignificant for time frame). Nutrition-Focused physical exam completed.   Medications reviewed.  Labs reviewed.   NUTRITION - FOCUSED PHYSICAL EXAM:    Most Recent Value  Orbital Region  No depletion  Upper Arm Region  No depletion  Thoracic and Lumbar Region  No depletion  Buccal Region  No depletion  Temple Region  No depletion  Clavicle Bone Region  No depletion  Clavicle and Acromion Bone Region  No depletion  Scapular Bone Region  No depletion  Dorsal Hand  No depletion  Patellar Region  No depletion  Anterior Thigh Region  No depletion  Posterior Calf Region  No depletion  Edema (RD Assessment)  None     Diet  Order:   Diet Order            Diet NPO time specified  Diet effective midnight        Diet regular Room service appropriate? Yes; Fluid consistency: Thin  Diet effective now              EDUCATION NEEDS:   Education needs have been addressed  Skin:  Skin Assessment: Reviewed RN Assessment  Last BM:  2/3  Height:   Ht Readings from Last 1 Encounters:  10/14/18 5\' 4"  (1.626 m)    Weight:   Wt Readings from Last 1 Encounters:  10/14/18 65.8 kg    Ideal Body Weight:  54.5 kg  BMI:  Body mass index is 24.9 kg/m.  Estimated Nutritional Needs:   Kcal:  1650-1850 kcal  Protein:  80-95 grams  Fluid:  >/= 1.6 L/day   Mariana Single RD, LDN Clinical Nutrition Pager # - [phone]

## 2018-10-14 NOTE — Progress Notes (Signed)
[facility] PROGRESS NOTE  Samantha [name] RSW:[id number] DOB: [birth year] DOA: 10/13/2018   PCP: Patient, No Pcp Per     Assessment/Plan: Active Problems:   GIB (gastrointestinal bleeding)   Samantha [name] is a 43 y.o. female with 2-3 weeks of black tarry stools several times per day and coffee grounds emesis in the setting of chronic NSAID use (Motrin, Ibuprofen) that she takes multiple times per day for chronic back pain. Reports occasional red color to her stools. Has been having black loose stools for the last few weeks. Having right flank pain. Occasional heartburn. Occasional dysphagia. 6 pound unintentional weight loss in the past week. Denies having an EGD in the past. Occasional alcohol. Hgb 12.3. GI pathogen panel negative  Assessment and plan GI bleed With melanotic stools and coffee-ground emesis, seems to be improving, slight drop in hemoglobin from 14-12 Chronic NSAID use concerning for peptic ulcer disease, seen by gastroenterology, they recommend EGD, continue Protonix 40 mg IV every 12,EGD showed acute gastritis, no ulcer Observe for another 24 hours post EGD to ensure that the patient does not have any recurrent bleeding  hidadenitis suppurativa with skin / soft tissue infection in left axilla:  continue doxycycline, unclear if there is a significant amount of purulence that would benefit from drainage, thus - [place] of axilla shows subcutaneous nodules.  Recommend outpatient CT chest to include the axilla to further define these nodules  -Smoking: encouraged cessation, she reports she prefers nicotine patch  -Right sided back pain: chronic and of unclear etiology; patient declines opioid pain medication, prefers trial of acetaminophen    DVT prophylaxsis SCDs  Code Status:  Full code    Family Communication: Discussed in detail with the patient, all imaging results, lab results explained to the patient   Disposition Plan:  Dc in 24 hrs if stable       Consultants:  GI  Procedures:  EGD  Antibiotics: Anti-infectives (From admission, onward)   Start     Dose/Rate Route Frequency Ordered Stop   10/14/18 0000  [MAR Hold]  doxycycline (VIBRA-TABS) tablet 100 mg     (MAR Hold since Tue 10/14/2018 at 1134. Reason: Transfer to a Procedural area.)   100 mg Oral Every 12 hours 10/13/18 2346           HPI/Subjective: She is slightly groggy after her EGD , but able to answer questions  Objective: Vitals:   10/13/18 2300 10/14/18 0020 10/14/18 0546 10/14/18 1137  BP: 111/81 123/88 98/72 121/80  Pulse: 67 63 64 78  Resp: 16 18 16 19   Temp: 98.7 F (37.1 C) 99.4 F (37.4 C) 98.5 F (36.9 C) 98.3 F (36.8 C)  TempSrc: Oral Oral Oral Oral  SpO2: 97% 98% 98%   Weight:    65.8 kg  Height:    5\' 4"  (1.626 m)    Intake/Output Summary (Last 24 hours) at 10/14/2018 1217 Last data filed at 10/14/2018 0949 Gross per 24 hour  Intake 0 ml  Output 0 ml  Net 0 ml    Exam:  Examination:  General exam: Appears calm and comfortable  Respiratory system: Clear to auscultation. Respiratory effort normal. Cardiovascular system: S1 & S2 heard, RRR. No JVD, murmurs, rubs, gallops or clicks. No pedal edema. Gastrointestinal system: Abdomen is nondistended, soft and nontender. No organomegaly or masses felt. Normal bowel sounds heard. Central nervous system: Alert and oriented. No focal neurological deficits. Extremities: Symmetric 5 x 5 power. Skin: No rashes, lesions  or ulcers Psychiatry: Judgement and insight appear normal. Mood & affect appropriate.     Data Reviewed: I have personally reviewed following labs and imaging studies  Micro Results Recent Results (from the past 240 hour(s))  Gastrointestinal Panel by PCR , Stool     Status: None   Collection Time: 10/13/18  8:39 PM  Result Value Ref Range Status   Campylobacter species NOT DETECTED NOT DETECTED Final   Plesimonas shigelloides NOT DETECTED NOT DETECTED Final    Salmonella species NOT DETECTED NOT DETECTED Final   Yersinia enterocolitica NOT DETECTED NOT DETECTED Final   Vibrio species NOT DETECTED NOT DETECTED Final   Vibrio cholerae NOT DETECTED NOT DETECTED Final   Enteroaggregative E coli (EAEC) NOT DETECTED NOT DETECTED Final   Enteropathogenic E coli (EPEC) NOT DETECTED NOT DETECTED Final   Enterotoxigenic E coli (ETEC) NOT DETECTED NOT DETECTED Final   Shiga like toxin producing E coli (STEC) NOT DETECTED NOT DETECTED Final   Shigella/Enteroinvasive E coli (EIEC) NOT DETECTED NOT DETECTED Final   Cryptosporidium NOT DETECTED NOT DETECTED Final   Cyclospora cayetanensis NOT DETECTED NOT DETECTED Final   Entamoeba histolytica NOT DETECTED NOT DETECTED Final   Giardia lamblia NOT DETECTED NOT DETECTED Final   Adenovirus F40/41 NOT DETECTED NOT DETECTED Final   Astrovirus NOT DETECTED NOT DETECTED Final   Norovirus GI/GII NOT DETECTED NOT DETECTED Final   Rotavirus A NOT DETECTED NOT DETECTED Final   Sapovirus (I, II, IV, and V) NOT DETECTED NOT DETECTED Final    Comment: Performed at [facility], [address], [place], [place] [address]    Radiology Reports Dg Ankle Complete Left  Result Date: 09/26/2018 CLINICAL DATA:  Acute left ankle pain and swelling without known injury. EXAM: LEFT ANKLE COMPLETE - 3+ VIEW COMPARISON:  None. FINDINGS: There is no evidence of fracture, dislocation, or joint effusion. There is no evidence of arthropathy or other focal bone abnormality. Soft tissues are unremarkable. IMPRESSION: Negative. Electronically Signed   By: Marijo Conception, M.D.   On: 09/26/2018 12:59   Ct Abdomen Pelvis W Contrast  Result Date: 10/13/2018 CLINICAL DATA:  43 year old female with 3 weeks of progressive abdominal pain, nausea vomiting. Chronic back pain. Recently diagnosed with pneumonia and started on Augmentin. EXAM: CT ABDOMEN AND PELVIS WITH CONTRAST TECHNIQUE: Multidetector CT imaging of the abdomen and pelvis was  performed using the standard protocol following bolus administration of intravenous contrast. CONTRAST:  180mL ISOVUE-300 IOPAMIDOL (ISOVUE-300) INJECTION 61% COMPARISON:  Noncontrast CT Abdomen and Pelvis 05/04/2018. FINDINGS: Lower chest: The visible lung bases are stable and negative. No pleural or pericardial effusion. Hepatobiliary: Negative liver and gallbladder. Pancreas: Negative. Spleen: Negative. Adrenals/Urinary Tract: Normal adrenal glands. Bilateral renal enhancement and contrast excretion is symmetric and normal. Normal proximal ureters. Decompressed urinary bladder. Chronic pelvic phleboliths incidentally noted. Stomach/Bowel: Negative rectosigmoid colon. Negative descending colon. Retained stool in the proximal transverse colon and right colon which are redundant. Normal appendix (coronal image 32). No large bowel inflammation. Negative terminal ileum. No dilated small bowel. No mesenteric inflammatory stranding identified. There is a small chronic metallic clip or foreign body at the gastric cardia which is unchanged. Otherwise negative stomach. Negative duodenum. No free air, free fluid. Vascular/Lymphatic: Major arterial structures are patent. There is right iliac artery atherosclerosis. Portal venous system appears grossly patent. No lymphadenopathy. Reproductive: Negative uterus, retroverted. Low-density bilateral ovarian cysts larger on the right (series 2, image 63), but smaller than on the 2019 comparison. Other: No pelvic  free fluid. Musculoskeletal: No acute osseous abnormality and largely unremarkable for age. IMPRESSION: 1. Bilateral ovarian cysts, larger on the right but smaller than on a 2019 CT. 2. No acute or inflammatory process identified in the abdomen or pelvis. Normal appendix. 3. Visible lung bases are negative. Electronically Signed   By: Genevie Ann M.D.   On: 10/13/2018 22:40   Dg Abd 2 Views  Result Date: 10/13/2018 CLINICAL DATA:  Nausea and vomiting for 3 weeks EXAM:  ABDOMEN - 2 VIEW COMPARISON:  CT abdomen and pelvis 05/04/2018 FINDINGS: Phleboliths in pelvis. Nonobstructive bowel gas pattern. No bowel dilatation or bowel wall thickening. Scattered stool throughout colon. Bones unremarkable. No definite urinary tract calcifications. IMPRESSION: Nonobstructive bowel gas pattern. Electronically Signed   By: Lavonia Dana M.D.   On: 10/13/2018 16:59   [place] Soft Tissue Non Vascular  Result Date: 10/14/2018 CLINICAL DATA:  Chronic left axillary pain. EXAM: ULTRASOUND LEFT UPPER EXTREMITY LIMITED TECHNIQUE: Ultrasound examination of the upper extremity soft tissues was performed in the area of clinical concern. COMPARISON:  None FINDINGS: There are 6 solid inhomogeneous hypoechoic well-defined nodules in the subcutaneous fat in the left axilla. The largest measures 2.5 x 2.1 x 0.5 cm. There is a single 2.0 x 1.3 x 0.8 cm lymph node with a benign fatty hilum. The other nodules do not have fatty hila. One of the nodules has slight increased vascularity. The underlying muscle appears normal. IMPRESSION: Multiple well-defined soft tissue nodules in the left axilla, not consistent with benign reactive lymph nodes. The nodules are nonspecific. Needle biopsy or CT scan of the chest with contrast to look for other nodules should be considered. Electronically Signed   By: Lorriane Shire M.D.   On: 10/14/2018 08:38     CBC Recent Labs  Lab 10/13/18 1529 10/14/18 0448  WBC 7.3 6.5  HGB 14.3 12.3  HCT 43.1 39.0  PLT 328 285  MCV 92.5 93.1  MCH 30.7 29.4  MCHC 33.2 31.5  RDW 12.7 12.3  LYMPHSABS 3.4  --   MONOABS 0.6  --   EOSABS 0.1  --   BASOSABS 0.0  --     Chemistries  Recent Labs  Lab 10/13/18 1529 10/14/18 0448  NA 135 136  K 4.4 3.8  CL 103 107  CO2 23 23  GLUCOSE 90 81  BUN 13 14  CREATININE 0.71 0.73  CALCIUM 9.4 8.4*  AST 18  --   ALT 9  --   ALKPHOS 69  --   BILITOT 0.8  --     ------------------------------------------------------------------------------------------------------------------ estimated creatinine clearance is 86.3 mL/min (by C-G formula based on SCr of 0.73 mg/dL). ------------------------------------------------------------------------------------------------------------------ No results for input(s): HGBA1C in the last 72 hours. ------------------------------------------------------------------------------------------------------------------ No results for input(s): CHOL, HDL, LDLCALC, TRIG, CHOLHDL, LDLDIRECT in the last 72 hours. ------------------------------------------------------------------------------------------------------------------ No results for input(s): TSH, T4TOTAL, T3FREE, THYROIDAB in the last 72 hours.  Invalid input(s): FREET3 ------------------------------------------------------------------------------------------------------------------ No results for input(s): VITAMINB12, FOLATE, FERRITIN, TIBC, IRON, RETICCTPCT in the last 72 hours.  Coagulation profile Recent Labs  Lab 10/14/18 0448  INR 0.97    No results for input(s): DDIMER in the last 72 hours.  Cardiac Enzymes No results for input(s): CKMB, TROPONINI, MYOGLOBIN in the last 168 hours.  Invalid input(s): CK ------------------------------------------------------------------------------------------------------------------ Invalid input(s): POCBNP   CBG: No results for input(s): GLUCAP in the last 168 hours.     Studies: Ct Abdomen Pelvis W Contrast  Result Date: 10/13/2018 CLINICAL DATA:  43 year old female with 3 weeks of progressive abdominal pain, nausea vomiting. Chronic back pain. Recently diagnosed with pneumonia and started on Augmentin. EXAM: CT ABDOMEN AND PELVIS WITH CONTRAST TECHNIQUE: Multidetector CT imaging of the abdomen and pelvis was performed using the standard protocol following bolus administration of intravenous contrast. CONTRAST:   132mL ISOVUE-300 IOPAMIDOL (ISOVUE-300) INJECTION 61% COMPARISON:  Noncontrast CT Abdomen and Pelvis 05/04/2018. FINDINGS: Lower chest: The visible lung bases are stable and negative. No pleural or pericardial effusion. Hepatobiliary: Negative liver and gallbladder. Pancreas: Negative. Spleen: Negative. Adrenals/Urinary Tract: Normal adrenal glands. Bilateral renal enhancement and contrast excretion is symmetric and normal. Normal proximal ureters. Decompressed urinary bladder. Chronic pelvic phleboliths incidentally noted. Stomach/Bowel: Negative rectosigmoid colon. Negative descending colon. Retained stool in the proximal transverse colon and right colon which are redundant. Normal appendix (coronal image 32). No large bowel inflammation. Negative terminal ileum. No dilated small bowel. No mesenteric inflammatory stranding identified. There is a small chronic metallic clip or foreign body at the gastric cardia which is unchanged. Otherwise negative stomach. Negative duodenum. No free air, free fluid. Vascular/Lymphatic: Major arterial structures are patent. There is right iliac artery atherosclerosis. Portal venous system appears grossly patent. No lymphadenopathy. Reproductive: Negative uterus, retroverted. Low-density bilateral ovarian cysts larger on the right (series 2, image 63), but smaller than on the 2019 comparison. Other: No pelvic free fluid. Musculoskeletal: No acute osseous abnormality and largely unremarkable for age. IMPRESSION: 1. Bilateral ovarian cysts, larger on the right but smaller than on a 2019 CT. 2. No acute or inflammatory process identified in the abdomen or pelvis. Normal appendix. 3. Visible lung bases are negative. Electronically Signed   By: Genevie Ann M.D.   On: 10/13/2018 22:40   Dg Abd 2 Views  Result Date: 10/13/2018 CLINICAL DATA:  Nausea and vomiting for 3 weeks EXAM: ABDOMEN - 2 VIEW COMPARISON:  CT abdomen and pelvis 05/04/2018 FINDINGS: Phleboliths in pelvis. Nonobstructive  bowel gas pattern. No bowel dilatation or bowel wall thickening. Scattered stool throughout colon. Bones unremarkable. No definite urinary tract calcifications. IMPRESSION: Nonobstructive bowel gas pattern. Electronically Signed   By: Lavonia Dana M.D.   On: 10/13/2018 16:59   [place] Soft Tissue Non Vascular  Result Date: 10/14/2018 CLINICAL DATA:  Chronic left axillary pain. EXAM: ULTRASOUND LEFT UPPER EXTREMITY LIMITED TECHNIQUE: Ultrasound examination of the upper extremity soft tissues was performed in the area of clinical concern. COMPARISON:  None FINDINGS: There are 6 solid inhomogeneous hypoechoic well-defined nodules in the subcutaneous fat in the left axilla. The largest measures 2.5 x 2.1 x 0.5 cm. There is a single 2.0 x 1.3 x 0.8 cm lymph node with a benign fatty hilum. The other nodules do not have fatty hila. One of the nodules has slight increased vascularity. The underlying muscle appears normal. IMPRESSION: Multiple well-defined soft tissue nodules in the left axilla, not consistent with benign reactive lymph nodes. The nodules are nonspecific. Needle biopsy or CT scan of the chest with contrast to look for other nodules should be considered. Electronically Signed   By: Lorriane Shire M.D.   On: 10/14/2018 08:38      Lab Results  Component Value Date   HGBA1C 5.9 (H) 10/20/2015   HGBA1C 5.8 (H) 04/22/2015   Lab Results  Component Value Date   LDLCALC 121 (H) 10/20/2015   CREATININE 0.73 10/14/2018       Scheduled Meds: . [MAR Hold] doxycycline  100 mg Oral Q12H  . [MAR Hold] nicotine  14 mg Transdermal Daily  . [MAR Hold] pantoprazole (PROTONIX) IV  40 mg Intravenous Q12H  . [MAR Hold] sodium chloride flush  3 mL Intravenous Q12H   Continuous Infusions: . [START ON 10/15/2018] lactated ringers       LOS: 0 days    Time spent: >30 MINS    Reyne Dumas  [facility] Pager [phone]. If 7PM-7AM, please contact night-coverage at [email or web address],  password [facility] 10/14/2018, 12:17 PM  LOS: 0 days

## 2018-10-14 NOTE — Interval H&P Note (Signed)
History and Physical Interval Note:  10/14/2018 12:09 PM  Samantha [name]  has presented today for surgery, with the diagnosis of GI bleed  The various methods of treatment have been discussed with the patient and family. After consideration of risks, benefits and other options for treatment, the patient has consented to  Procedure(s): ESOPHAGOGASTRODUODENOSCOPY (EGD) WITH PROPOFOL (N/A) as a surgical intervention .  The patient's history has been reviewed, patient examined, no change in status, stable for surgery.  I have reviewed the patient's chart and labs.  Questions were answered to the patient's satisfaction.     [name]

## 2018-10-14 NOTE — Op Note (Signed)
[facility] Patient Name: Samantha [name] Procedure Date: 10/14/2018 MRN: [id number] Attending MD: [name] , MD Date of Birth: 1976-05-21 CSN: [id number] Age: 43 Admit Type: Outpatient Procedure:                Upper GI endoscopy Indications:              Suspected upper gastrointestinal bleeding,                            Esophageal reflux, Melena Providers:                [name], MD, Jeanella Cara, RN,                            Elspeth Cho Tech., Technician, Stephanie                            [place] CRNA Referring MD:             hospital team Medicines:                Propofol per Anesthesia, Monitored Anesthesia Care Complications:            No immediate complications. Estimated Blood Loss:     Estimated blood loss: none. Procedure:                Pre-Anesthesia Assessment:                           - Prior to the procedure, a History and Physical                            was performed, and patient medications and                            allergies were reviewed. The patient's tolerance of                            previous anesthesia was also reviewed. The risks                            and benefits of the procedure and the sedation                            options and risks were discussed with the patient.                            All questions were answered, and informed consent                            was obtained. Prior Anticoagulants: The patient has                            taken no previous anticoagulant or antiplatelet                            agents. ASA  Grade Assessment: II - A patient with                            mild systemic disease. After reviewing the risks                            and benefits, the patient was deemed in                            satisfactory condition to undergo the procedure.                           After obtaining informed consent, the endoscope was           passed under direct vision. Throughout the                            procedure, the patient's blood pressure, pulse, and                            oxygen saturations were monitored continuously. The                            GIF-H190 ([id number]) Olympus gastroscope was                            introduced through the mouth, and advanced to the                            second part of duodenum. The upper GI endoscopy was                            accomplished without difficulty. The patient                            tolerated the procedure well. Scope In: Scope Out: Findings:      The examined esophagus was normal.      The Z-line was regular.      Localized minimal inflammation characterized by congestion (edema) and       erythema was found in the gastric antrum.      The exam of the stomach was otherwise normal.      The examined duodenum was normal. Impression:               - Normal esophagus.                           - Z-line regular.                           - Acute gastritis.                           - Normal examined duodenum.                           - No specimens collected. Moderate  Sedation:      Not Applicable - Patient had care per Anesthesia. Recommendation:           - Advance diet as tolerated and clear liquid diet.                           - Observe patient's clinical course. Procedure Code(s):        --- Professional ---                           [id number], Esophagogastroduodenoscopy, flexible,                            transoral; diagnostic, including collection of                            specimen(s) by brushing or washing, when performed                            (separate procedure) Diagnosis Code(s):        --- Professional ---                           K92.1, Melena (includes Hematochezia)                           K29.00, Acute gastritis without bleeding                           K21.9, Gastro-esophageal reflux disease without                             esophagitis CPT copyright 2018 [employer]. All rights reserved. The codes documented in this report are preliminary and upon coder review may  be revised to meet current compliance requirements. [name], MD 10/14/2018 12:34:46 PM This report has been signed electronically. Number of Addenda: 0

## 2018-10-14 NOTE — Consult Note (Addendum)
Referring Provider: Dr. Stana Bunting Primary Care Physician:  Patient, No Pcp Per Primary Gastroenterologist:  Althia Forts  Reason for Consultation:  GI bleed  HPI: Samantha [name] is a 43 y.o. female with 2-3 weeks of black tarry stools several times per day and coffee grounds emesis in the setting of chronic NSAID use (Motrin, Ibuprofen) that she takes multiple times per day for chronic back pain. Reports occasional red color to her stools. Has been having black loose stools for the last few weeks. Having right flank pain. Occasional heartburn. Occasional dysphagia. 6 pound unintentional weight loss in the past week. Denies having an EGD in the past. Occasional alcohol. Hgb 12.3. GI pathogen panel negative.  Past Medical History:  Diagnosis Date  . Back pain   . 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 Hydradenitis   . Hyperlipidemia   . TIA (transient ischemic attack) 04/2015   questionable, most likely complicated migraine    Past Surgical History:  Procedure Laterality Date  . CERVICAL BIOPSY  W/ LOOP ELECTRODE EXCISION    . LEEP      Prior to Admission medications   Medication Sig Start Date End Date Taking? Authorizing Provider  amoxicillin-clavulanate (AUGMENTIN) 875-125 MG tablet Take 1 tablet by mouth 2 (two) times daily for 10 days. Take with food. 10/05/18 10/15/18 Yes Tereasa Coop, PA-C  escitalopram (LEXAPRO) 10 MG tablet Take 1 tablet (10 mg total) by mouth daily. 11/12/17  Yes Riccio, Angela C, DO  ibuprofen (ADVIL,MOTRIN) 800 MG tablet Take 1 tablet (800 mg total) by mouth 2 (two) times daily. Patient taking differently: Take 800 mg by mouth 2 (two) times daily as needed for mild pain.  10/05/18  Yes Tereasa Coop, PA-C    Scheduled Meds: . doxycycline  100 mg Oral Q12H  . nicotine  14 mg Transdermal Daily  . pantoprazole (PROTONIX) IV  40 mg Intravenous Q12H  . sodium chloride flush  3 mL Intravenous Q12H   Continuous Infusions: . [START ON 10/15/2018] lactated ringers     PRN  Meds:.acetaminophen, ondansetron (ZOFRAN) IV  Allergies as of 10/13/2018  . (No Known Allergies)    Family History  Problem Relation Age of Onset  . Hypertension Mother   . Lupus Mother   . Diabetes Father   . Thyroid disease Sister   . Diabetes Sister   . Cancer Sister        stomach  . Diabetes Maternal Grandmother   . Anesthesia problems Neg Hx   . Other Neg Hx   . Migraines Neg Hx     Social History   Socioeconomic History  . Marital status: Single    Spouse name: Not on file  . Number of children: 1  . Years of education: 19  . Highest education level: Not on file  Occupational History  . Not on file  Social Needs  . Financial resource strain: Not on file  . Food insecurity:    Worry: Not on file    Inability: Not on file  . Transportation needs:    Medical: Not on file    Non-medical: Not on file  Tobacco Use  . Smoking status: Current Every Day Smoker    Packs/day: 1.00    Years: 17.00    Pack years: 17.00    Types: Cigars  . Smokeless tobacco: Never Used  . Tobacco comment: 3 cigarettes a day   Substance and Sexual Activity  . Alcohol use: Yes    Alcohol/week: 1.0 standard drinks  Types: 1 Shots of liquor per week    Comment: drink on the weekends  . Drug use: Yes    Frequency: 3.0 times per week    Types: Marijuana  . Sexual activity: Not Currently    Birth control/protection: None  Lifestyle  . Physical activity:    Days per week: 7 days    Minutes per session: 30 min  . Stress: Rather much  Relationships  . Social connections:    Talks on phone: Three times a week    Gets together: More than three times a week    Attends religious service: 1 to 4 times per year    Active member of club or organization: No    Attends meetings of clubs or organizations: Never    Relationship status: Never married  . Intimate partner violence:    Fear of current or ex partner: No    Emotionally abused: No    Physically abused: No    Forced sexual  activity: No  Other Topics Concern  . Not on file  Social History Narrative   Patient drinks sodas all day long.   Patient is right handed.     Review of Systems: All negative except as stated above in HPI.  Physical Exam: Vital signs: Vitals:   10/14/18 0020 10/14/18 0546  BP: 123/88 98/72  Pulse: 63 64  Resp: 18 16  Temp: 99.4 F (37.4 C) 98.5 F (36.9 C)  SpO2: 98% 98%   Last BM Date: 10/13/18 General:   Somnolent, Well-developed, well-nourished, no acute distress Head: normocephalic, atraumatic Eyes: anicteric sclera ENT: oropharynx clear Neck: supple, nontender Lungs:  Expiratory wheezing, coarse breath sounds. No acute distress Heart:  Regular rate and rhythm; no murmurs, clicks, rubs,  or gallops. Abdomen: soft, nontender, nondistended, +BS  Rectal:  Deferred Ext: no edema  GI:  Lab Results: Recent Labs    10/13/18 1529 10/14/18 0448  WBC 7.3 6.5  HGB 14.3 12.3  HCT 43.1 39.0  PLT 328 285   BMET Recent Labs    10/13/18 1529 10/14/18 0448  NA 135 136  K 4.4 3.8  CL 103 107  CO2 23 23  GLUCOSE 90 81  BUN 13 14  CREATININE 0.71 0.73  CALCIUM 9.4 8.4*   LFT Recent Labs    10/13/18 1529  PROT 7.9  ALBUMIN 4.0  AST 18  ALT 9  ALKPHOS 69  BILITOT 0.8   PT/INR Recent Labs    10/14/18 0448  LABPROT 12.8  INR 0.97     Studies/Results: Ct Abdomen Pelvis W Contrast  Result Date: 10/13/2018 CLINICAL DATA:  43 year old female with 3 weeks of progressive abdominal pain, nausea vomiting. Chronic back pain. Recently diagnosed with pneumonia and started on Augmentin. EXAM: CT ABDOMEN AND PELVIS WITH CONTRAST TECHNIQUE: Multidetector CT imaging of the abdomen and pelvis was performed using the standard protocol following bolus administration of intravenous contrast. CONTRAST:  172mL ISOVUE-300 IOPAMIDOL (ISOVUE-300) INJECTION 61% COMPARISON:  Noncontrast CT Abdomen and Pelvis 05/04/2018. FINDINGS: Lower chest: The visible lung bases are stable and  negative. No pleural or pericardial effusion. Hepatobiliary: Negative liver and gallbladder. Pancreas: Negative. Spleen: Negative. Adrenals/Urinary Tract: Normal adrenal glands. Bilateral renal enhancement and contrast excretion is symmetric and normal. Normal proximal ureters. Decompressed urinary bladder. Chronic pelvic phleboliths incidentally noted. Stomach/Bowel: Negative rectosigmoid colon. Negative descending colon. Retained stool in the proximal transverse colon and right colon which are redundant. Normal appendix (coronal image 32). No large bowel inflammation. Negative terminal ileum.  No dilated small bowel. No mesenteric inflammatory stranding identified. There is a small chronic metallic clip or foreign body at the gastric cardia which is unchanged. Otherwise negative stomach. Negative duodenum. No free air, free fluid. Vascular/Lymphatic: Major arterial structures are patent. There is right iliac artery atherosclerosis. Portal venous system appears grossly patent. No lymphadenopathy. Reproductive: Negative uterus, retroverted. Low-density bilateral ovarian cysts larger on the right (series 2, image 63), but smaller than on the 2019 comparison. Other: No pelvic free fluid. Musculoskeletal: No acute osseous abnormality and largely unremarkable for age. IMPRESSION: 1. Bilateral ovarian cysts, larger on the right but smaller than on a 2019 CT. 2. No acute or inflammatory process identified in the abdomen or pelvis. Normal appendix. 3. Visible lung bases are negative. Electronically Signed   By: Genevie Ann M.D.   On: 10/13/2018 22:40   Dg Abd 2 Views  Result Date: 10/13/2018 CLINICAL DATA:  Nausea and vomiting for 3 weeks EXAM: ABDOMEN - 2 VIEW COMPARISON:  CT abdomen and pelvis 05/04/2018 FINDINGS: Phleboliths in pelvis. Nonobstructive bowel gas pattern. No bowel dilatation or bowel wall thickening. Scattered stool throughout colon. Bones unremarkable. No definite urinary tract calcifications. IMPRESSION:  Nonobstructive bowel gas pattern. Electronically Signed   By: Lavonia Dana M.D.   On: 10/13/2018 16:59   [place] Soft Tissue Non Vascular  Result Date: 10/14/2018 CLINICAL DATA:  Chronic left axillary pain. EXAM: ULTRASOUND LEFT UPPER EXTREMITY LIMITED TECHNIQUE: Ultrasound examination of the upper extremity soft tissues was performed in the area of clinical concern. COMPARISON:  None FINDINGS: There are 6 solid inhomogeneous hypoechoic well-defined nodules in the subcutaneous fat in the left axilla. The largest measures 2.5 x 2.1 x 0.5 cm. There is a single 2.0 x 1.3 x 0.8 cm lymph node with a benign fatty hilum. The other nodules do not have fatty hila. One of the nodules has slight increased vascularity. The underlying muscle appears normal. IMPRESSION: Multiple well-defined soft tissue nodules in the left axilla, not consistent with benign reactive lymph nodes. The nodules are nonspecific. Needle biopsy or CT scan of the chest with contrast to look for other nodules should be considered. Electronically Signed   By: Lorriane Shire M.D.   On: 10/14/2018 08:38    Impression/Plan: GI bleed with melenic stools and coffee grounds emesis in the setting of chronic NSAIDs concerning for a peptic ulcer bleed. EGD today to further evaluate. NPO for EGD. Continue Protonix 40 mg IV Q 12 hours pending EGD findings. Risks/benefits of EGD discussed with patient and she agrees to proceed.    LOS: 0 days   [name]  10/14/2018, 11:06 AM  Questions please call [phone]

## 2018-10-14 NOTE — Transfer of Care (Signed)
Immediate Anesthesia Transfer of Care Note  Patient: Samantha [name]  Procedure(s) Performed: ESOPHAGOGASTRODUODENOSCOPY (EGD) WITH PROPOFOL (N/A )  Patient Location: PACU and Endoscopy Unit  Anesthesia Type:MAC  Level of Consciousness: awake and alert   Airway & Oxygen Therapy: Patient Spontanous Breathing and Patient connected to nasal cannula oxygen  Post-op Assessment: Report given to RN and Post -op Vital signs reviewed and stable  Post vital signs: Reviewed and stable  Last Vitals:  Vitals Value Taken Time  BP 93/72 10/14/2018 12:37 PM  Temp    Pulse 83 10/14/2018 12:38 PM  Resp 29 10/14/2018 12:38 PM  SpO2 100 % 10/14/2018 12:38 PM  Vitals shown include unvalidated device data.  Last Pain:  Vitals:   10/14/18 1137  TempSrc: Oral  PainSc: 0-No pain      Patients Stated Pain Goal: 0 (18/84/16 6063)  Complications: No apparent anesthesia complications

## 2018-10-14 NOTE — Anesthesia Preprocedure Evaluation (Addendum)
Anesthesia Evaluation  Patient identified by MRN, date of birth, ID band Patient awake    Reviewed: Allergy & Precautions, NPO status , Patient's Chart, lab work & pertinent test results  History of Anesthesia Complications Negative for: history of anesthetic complications  Airway Mallampati: II  TM Distance: >3 FB Neck ROM: Full    Dental no notable dental hx. (+) Teeth Intact   Pulmonary asthma , Current Smoker,    Pulmonary exam normal        Cardiovascular negative cardio ROS Normal cardiovascular exam     Neuro/Psych  Headaches, PSYCHIATRIC DISORDERS Anxiety Depression    GI/Hepatic negative GI ROS, (+)     substance abuse  marijuana use,   Endo/Other  negative endocrine ROS  Renal/GU negative Renal ROS  negative genitourinary   Musculoskeletal negative musculoskeletal ROS (+)   Abdominal   Peds  Hematology negative hematology ROS (+)   Anesthesia Other Findings 43 yo F for EGD for suspected GIB - PMH: current smoker, asthma, anx/dep  Reproductive/Obstetrics                            Anesthesia Physical Anesthesia Plan  ASA: II  Anesthesia Plan: MAC   Post-op Pain Management:    Induction:   PONV Risk Score and Plan: 1 and Propofol infusion and Treatment may vary due to age or medical condition  Airway Management Planned: Nasal Cannula and Simple Face Mask  Additional Equipment: None  Intra-op Plan:   Post-operative Plan:   Informed Consent: I have reviewed the patients History and Physical, chart, labs and discussed the procedure including the risks, benefits and alternatives for the proposed anesthesia with the patient or authorized representative who has indicated his/her understanding and acceptance.       Plan Discussed with:   Anesthesia Plan Comments:        Anesthesia Quick Evaluation

## 2018-10-15 ENCOUNTER — Observation Stay (HOSPITAL_COMMUNITY): Payer: Self-pay

## 2018-10-15 ENCOUNTER — Encounter (HOSPITAL_COMMUNITY): Payer: Self-pay | Admitting: Gastroenterology

## 2018-10-15 DIAGNOSIS — K254 Chronic or unspecified gastric ulcer with hemorrhage: Secondary | ICD-10-CM

## 2018-10-15 LAB — CBC
HCT: 41.2 % (ref 36.0–46.0)
Hemoglobin: 12.9 g/dL (ref 12.0–15.0)
MCH: 29.5 pg (ref 26.0–34.0)
MCHC: 31.3 g/dL (ref 30.0–36.0)
MCV: 94.3 fL (ref 80.0–100.0)
Platelets: 287 10*3/uL (ref 150–400)
RBC: 4.37 MIL/uL (ref 3.87–5.11)
RDW: 11.9 % (ref 11.5–15.5)
WBC: 5 10*3/uL (ref 4.0–10.5)
nRBC: 0 % (ref 0.0–0.2)

## 2018-10-15 LAB — H. PYLORI ANTIBODY, IGG: H Pylori IgG: 1.01 Index Value — ABNORMAL HIGH (ref 0.00–0.79)

## 2018-10-15 MED ORDER — PANTOPRAZOLE SODIUM 40 MG PO TBEC
40.0000 mg | DELAYED_RELEASE_TABLET | Freq: Two times a day (BID) | ORAL | Status: DC
  Administered 2018-10-15 – 2018-10-16 (×2): 40 mg via ORAL
  Filled 2018-10-15 (×2): qty 1

## 2018-10-15 MED ORDER — HYDROCORTISONE 2.5 % RE CREA
TOPICAL_CREAM | Freq: Four times a day (QID) | RECTAL | Status: DC | PRN
  Administered 2018-10-15: 1 via RECTAL
  Filled 2018-10-15: qty 28.35

## 2018-10-15 NOTE — Anesthesia Postprocedure Evaluation (Signed)
Anesthesia Post Note  Patient: Samantha [name]  Procedure(s) Performed: ESOPHAGOGASTRODUODENOSCOPY (EGD) WITH PROPOFOL (N/A )     Patient location during evaluation: Endoscopy Anesthesia Type: MAC Level of consciousness: awake and alert Pain management: pain level controlled Vital Signs Assessment: post-procedure vital signs reviewed and stable Respiratory status: spontaneous breathing, nonlabored ventilation, respiratory function stable and patient connected to nasal cannula oxygen Cardiovascular status: stable and blood pressure returned to baseline Postop Assessment: no apparent nausea or vomiting Anesthetic complications: no    Last Vitals:  Vitals:   10/14/18 2217 10/15/18 0657  BP: 131/76 107/71  Pulse: (!) 59 63  Resp: 18 16  Temp: 37.4 C 37.3 C  SpO2: 96% 96%    Last Pain:  Vitals:   10/15/18 0657  TempSrc: Oral  PainSc:                  Austynn Pridmore L Maryclare Nydam

## 2018-10-15 NOTE — Progress Notes (Signed)
[facility] PROGRESS NOTE  Samantha [name] EXB:[id number] DOB: [birth year] DOA: 10/13/2018   PCP: Patient, No Pcp Per     Assessment/Plan: Active Problems:   GIB (gastrointestinal bleeding)   Samantha [name] is a 43 y.o. female with 2-3 weeks of black tarry stools several times per day and coffee grounds emesis in the setting of chronic NSAID use (Motrin, Ibuprofen) that she takes multiple times per day for chronic back pain. Reports occasional red color to her stools. Has been having black loose stools for the last few weeks. Having right flank pain. Occasional heartburn. Occasional dysphagia. 6 pound unintentional weight loss in the past week. Denies having an EGD in the past. Occasional alcohol. Hgb 12.3. GI pathogen panel negative  Assessment and plan GI bleed With melanotic stools and coffee-ground emesis, seems to be improving, slight drop in hemoglobin from 14-12.3>12.9 Chronic NSAID use concerning for peptic ulcer disease, seen by gastroenterology, they recommend EGD, continue Protonix 40 mg   every 12,EGD showed acute gastritis, no ulcer Observe for another 24 hours post EGD to ensure that the patient does not have any recurrent bleeding Tolerating soft diet , feeling weak and unable to go home Anticipate she will be ready tomorrow  hidadenitis suppurativa with skin / soft tissue infection in left axilla:  continue doxycycline, unclear if there is a significant amount of purulence that would benefit from drainage, thus - [place] of axilla shows subcutaneous nodules.  Recommend outpatient CT chest to include the axilla to further define these nodules  -Smoking: encouraged cessation, she reports she prefers nicotine patch  -Right sided back pain: chronic and of unclear etiology; patient declines opioid pain medication, prefers trial of acetaminophen  Low-grade fever-99.2, will obtain a chest x-ray , UA was negative on admission    DVT prophylaxsis SCDs  Code Status:  Full code     Family Communication: Discussed in detail with the patient, all imaging results, lab results explained to the patient   Disposition Plan:  DC  in 24 hrs if stable      Consultants:  GI  Procedures:  EGD  Antibiotics: Anti-infectives (From admission, onward)   Start     Dose/Rate Route Frequency Ordered Stop   10/14/18 0000  doxycycline (VIBRA-TABS) tablet 100 mg     100 mg Oral Every 12 hours 10/13/18 2346           HPI/Subjective: Now with  low-grade fever, complaining of weakness, no focal symptoms of cough  Objective: Vitals:   10/14/18 1529 10/14/18 2217 10/15/18 0657 10/15/18 1311  BP: 115/78 131/76 107/71 115/80  Pulse: 75 (!) 59 63 70  Resp: 18 18 16 16   Temp: 99.2 F (37.3 C) 99.3 F (37.4 C) 99.2 F (37.3 C) 99.1 F (37.3 C)  TempSrc: Oral Oral Oral Oral  SpO2: 98% 96% 96% 98%  Weight:      Height:        Intake/Output Summary (Last 24 hours) at 10/15/2018 1337 Last data filed at 10/15/2018 1004 Gross per 24 hour  Intake 1460.31 ml  Output 1700 ml  Net -239.69 ml    Exam:  Examination:  General exam: Appears calm and comfortable  Respiratory system: Clear to auscultation. Respiratory effort normal. Cardiovascular system: S1 & S2 heard, RRR. No JVD, murmurs, rubs, gallops or clicks. No pedal edema. Gastrointestinal system: Abdomen is nondistended, soft and nontender. No organomegaly or masses felt. Normal bowel sounds heard. Central nervous system: Alert and oriented. No focal  neurological deficits. Extremities: Symmetric 5 x 5 power. Skin: No rashes, lesions or ulcers Psychiatry: Judgement and insight appear normal. Mood & affect appropriate.     Data Reviewed: I have personally reviewed following labs and imaging studies  Micro Results Recent Results (from the past 240 hour(s))  Gastrointestinal Panel by PCR , Stool     Status: None   Collection Time: 10/13/18  8:39 PM  Result Value Ref Range Status   Campylobacter species NOT  DETECTED NOT DETECTED Final   Plesimonas shigelloides NOT DETECTED NOT DETECTED Final   Salmonella species NOT DETECTED NOT DETECTED Final   Yersinia enterocolitica NOT DETECTED NOT DETECTED Final   Vibrio species NOT DETECTED NOT DETECTED Final   Vibrio cholerae NOT DETECTED NOT DETECTED Final   Enteroaggregative E coli (EAEC) NOT DETECTED NOT DETECTED Final   Enteropathogenic E coli (EPEC) NOT DETECTED NOT DETECTED Final   Enterotoxigenic E coli (ETEC) NOT DETECTED NOT DETECTED Final   Shiga like toxin producing E coli (STEC) NOT DETECTED NOT DETECTED Final   Shigella/Enteroinvasive E coli (EIEC) NOT DETECTED NOT DETECTED Final   Cryptosporidium NOT DETECTED NOT DETECTED Final   Cyclospora cayetanensis NOT DETECTED NOT DETECTED Final   Entamoeba histolytica NOT DETECTED NOT DETECTED Final   Giardia lamblia NOT DETECTED NOT DETECTED Final   Adenovirus F40/41 NOT DETECTED NOT DETECTED Final   Astrovirus NOT DETECTED NOT DETECTED Final   Norovirus GI/GII NOT DETECTED NOT DETECTED Final   Rotavirus A NOT DETECTED NOT DETECTED Final   Sapovirus (I, II, IV, and V) NOT DETECTED NOT DETECTED Final    Comment: Performed at [facility], [address], [place], [place] [address]    Radiology Reports Dg Ankle Complete Left  Result Date: 09/26/2018 CLINICAL DATA:  Acute left ankle pain and swelling without known injury. EXAM: LEFT ANKLE COMPLETE - 3+ VIEW COMPARISON:  None. FINDINGS: There is no evidence of fracture, dislocation, or joint effusion. There is no evidence of arthropathy or other focal bone abnormality. Soft tissues are unremarkable. IMPRESSION: Negative. Electronically Signed   By: Marijo Conception, M.D.   On: 09/26/2018 12:59   Ct Abdomen Pelvis W Contrast  Result Date: 10/13/2018 CLINICAL DATA:  43 year old female with 3 weeks of progressive abdominal pain, nausea vomiting. Chronic back pain. Recently diagnosed with pneumonia and started on Augmentin. EXAM: CT ABDOMEN AND  PELVIS WITH CONTRAST TECHNIQUE: Multidetector CT imaging of the abdomen and pelvis was performed using the standard protocol following bolus administration of intravenous contrast. CONTRAST:  115mL ISOVUE-300 IOPAMIDOL (ISOVUE-300) INJECTION 61% COMPARISON:  Noncontrast CT Abdomen and Pelvis 05/04/2018. FINDINGS: Lower chest: The visible lung bases are stable and negative. No pleural or pericardial effusion. Hepatobiliary: Negative liver and gallbladder. Pancreas: Negative. Spleen: Negative. Adrenals/Urinary Tract: Normal adrenal glands. Bilateral renal enhancement and contrast excretion is symmetric and normal. Normal proximal ureters. Decompressed urinary bladder. Chronic pelvic phleboliths incidentally noted. Stomach/Bowel: Negative rectosigmoid colon. Negative descending colon. Retained stool in the proximal transverse colon and right colon which are redundant. Normal appendix (coronal image 32). No large bowel inflammation. Negative terminal ileum. No dilated small bowel. No mesenteric inflammatory stranding identified. There is a small chronic metallic clip or foreign body at the gastric cardia which is unchanged. Otherwise negative stomach. Negative duodenum. No free air, free fluid. Vascular/Lymphatic: Major arterial structures are patent. There is right iliac artery atherosclerosis. Portal venous system appears grossly patent. No lymphadenopathy. Reproductive: Negative uterus, retroverted. Low-density bilateral ovarian cysts larger on the right (series 2,  image 63), but smaller than on the 2019 comparison. Other: No pelvic free fluid. Musculoskeletal: No acute osseous abnormality and largely unremarkable for age. IMPRESSION: 1. Bilateral ovarian cysts, larger on the right but smaller than on a 2019 CT. 2. No acute or inflammatory process identified in the abdomen or pelvis. Normal appendix. 3. Visible lung bases are negative. Electronically Signed   By: Genevie Ann M.D.   On: 10/13/2018 22:40   Dg Abd 2  Views  Result Date: 10/13/2018 CLINICAL DATA:  Nausea and vomiting for 3 weeks EXAM: ABDOMEN - 2 VIEW COMPARISON:  CT abdomen and pelvis 05/04/2018 FINDINGS: Phleboliths in pelvis. Nonobstructive bowel gas pattern. No bowel dilatation or bowel wall thickening. Scattered stool throughout colon. Bones unremarkable. No definite urinary tract calcifications. IMPRESSION: Nonobstructive bowel gas pattern. Electronically Signed   By: Lavonia Dana M.D.   On: 10/13/2018 16:59   [place] Soft Tissue Non Vascular  Result Date: 10/14/2018 CLINICAL DATA:  Chronic left axillary pain. EXAM: ULTRASOUND LEFT UPPER EXTREMITY LIMITED TECHNIQUE: Ultrasound examination of the upper extremity soft tissues was performed in the area of clinical concern. COMPARISON:  None FINDINGS: There are 6 solid inhomogeneous hypoechoic well-defined nodules in the subcutaneous fat in the left axilla. The largest measures 2.5 x 2.1 x 0.5 cm. There is a single 2.0 x 1.3 x 0.8 cm lymph node with a benign fatty hilum. The other nodules do not have fatty hila. One of the nodules has slight increased vascularity. The underlying muscle appears normal. IMPRESSION: Multiple well-defined soft tissue nodules in the left axilla, not consistent with benign reactive lymph nodes. The nodules are nonspecific. Needle biopsy or CT scan of the chest with contrast to look for other nodules should be considered. Electronically Signed   By: Lorriane Shire M.D.   On: 10/14/2018 08:38     CBC Recent Labs  Lab 10/13/18 1529 10/14/18 0448 10/15/18 0528  WBC 7.3 6.5 5.0  HGB 14.3 12.3 12.9  HCT 43.1 39.0 41.2  PLT 328 285 287  MCV 92.5 93.1 94.3  MCH 30.7 29.4 29.5  MCHC 33.2 31.5 31.3  RDW 12.7 12.3 11.9  LYMPHSABS 3.4  --   --   MONOABS 0.6  --   --   EOSABS 0.1  --   --   BASOSABS 0.0  --   --     Chemistries  Recent Labs  Lab 10/13/18 1529 10/14/18 0448  NA 135 136  K 4.4 3.8  CL 103 107  CO2 23 23  GLUCOSE 90 81  BUN 13 14   CREATININE 0.71 0.73  CALCIUM 9.4 8.4*  AST 18  --   ALT 9  --   ALKPHOS 69  --   BILITOT 0.8  --    ------------------------------------------------------------------------------------------------------------------ estimated creatinine clearance is 86.3 mL/min (by C-G formula based on SCr of 0.73 mg/dL). ------------------------------------------------------------------------------------------------------------------ No results for input(s): HGBA1C in the last 72 hours. ------------------------------------------------------------------------------------------------------------------ No results for input(s): CHOL, HDL, LDLCALC, TRIG, CHOLHDL, LDLDIRECT in the last 72 hours. ------------------------------------------------------------------------------------------------------------------ No results for input(s): TSH, T4TOTAL, T3FREE, THYROIDAB in the last 72 hours.  Invalid input(s): FREET3 ------------------------------------------------------------------------------------------------------------------ No results for input(s): VITAMINB12, FOLATE, FERRITIN, TIBC, IRON, RETICCTPCT in the last 72 hours.  Coagulation profile Recent Labs  Lab 10/14/18 0448  INR 0.97    No results for input(s): DDIMER in the last 72 hours.  Cardiac Enzymes No results for input(s): CKMB, TROPONINI, MYOGLOBIN in the last 168 hours.  Invalid input(s): CK ------------------------------------------------------------------------------------------------------------------  Invalid input(s): POCBNP   CBG: No results for input(s): GLUCAP in the last 168 hours.     Studies: Ct Abdomen Pelvis W Contrast  Result Date: 10/13/2018 CLINICAL DATA:  43 year old female with 3 weeks of progressive abdominal pain, nausea vomiting. Chronic back pain. Recently diagnosed with pneumonia and started on Augmentin. EXAM: CT ABDOMEN AND PELVIS WITH CONTRAST TECHNIQUE: Multidetector CT imaging of the abdomen and pelvis was  performed using the standard protocol following bolus administration of intravenous contrast. CONTRAST:  167mL ISOVUE-300 IOPAMIDOL (ISOVUE-300) INJECTION 61% COMPARISON:  Noncontrast CT Abdomen and Pelvis 05/04/2018. FINDINGS: Lower chest: The visible lung bases are stable and negative. No pleural or pericardial effusion. Hepatobiliary: Negative liver and gallbladder. Pancreas: Negative. Spleen: Negative. Adrenals/Urinary Tract: Normal adrenal glands. Bilateral renal enhancement and contrast excretion is symmetric and normal. Normal proximal ureters. Decompressed urinary bladder. Chronic pelvic phleboliths incidentally noted. Stomach/Bowel: Negative rectosigmoid colon. Negative descending colon. Retained stool in the proximal transverse colon and right colon which are redundant. Normal appendix (coronal image 32). No large bowel inflammation. Negative terminal ileum. No dilated small bowel. No mesenteric inflammatory stranding identified. There is a small chronic metallic clip or foreign body at the gastric cardia which is unchanged. Otherwise negative stomach. Negative duodenum. No free air, free fluid. Vascular/Lymphatic: Major arterial structures are patent. There is right iliac artery atherosclerosis. Portal venous system appears grossly patent. No lymphadenopathy. Reproductive: Negative uterus, retroverted. Low-density bilateral ovarian cysts larger on the right (series 2, image 63), but smaller than on the 2019 comparison. Other: No pelvic free fluid. Musculoskeletal: No acute osseous abnormality and largely unremarkable for age. IMPRESSION: 1. Bilateral ovarian cysts, larger on the right but smaller than on a 2019 CT. 2. No acute or inflammatory process identified in the abdomen or pelvis. Normal appendix. 3. Visible lung bases are negative. Electronically Signed   By: Genevie Ann M.D.   On: 10/13/2018 22:40   Dg Abd 2 Views  Result Date: 10/13/2018 CLINICAL DATA:  Nausea and vomiting for 3 weeks EXAM:  ABDOMEN - 2 VIEW COMPARISON:  CT abdomen and pelvis 05/04/2018 FINDINGS: Phleboliths in pelvis. Nonobstructive bowel gas pattern. No bowel dilatation or bowel wall thickening. Scattered stool throughout colon. Bones unremarkable. No definite urinary tract calcifications. IMPRESSION: Nonobstructive bowel gas pattern. Electronically Signed   By: Lavonia Dana M.D.   On: 10/13/2018 16:59   [place] Soft Tissue Non Vascular  Result Date: 10/14/2018 CLINICAL DATA:  Chronic left axillary pain. EXAM: ULTRASOUND LEFT UPPER EXTREMITY LIMITED TECHNIQUE: Ultrasound examination of the upper extremity soft tissues was performed in the area of clinical concern. COMPARISON:  None FINDINGS: There are 6 solid inhomogeneous hypoechoic well-defined nodules in the subcutaneous fat in the left axilla. The largest measures 2.5 x 2.1 x 0.5 cm. There is a single 2.0 x 1.3 x 0.8 cm lymph node with a benign fatty hilum. The other nodules do not have fatty hila. One of the nodules has slight increased vascularity. The underlying muscle appears normal. IMPRESSION: Multiple well-defined soft tissue nodules in the left axilla, not consistent with benign reactive lymph nodes. The nodules are nonspecific. Needle biopsy or CT scan of the chest with contrast to look for other nodules should be considered. Electronically Signed   By: Lorriane Shire M.D.   On: 10/14/2018 08:38      Lab Results  Component Value Date   HGBA1C 5.9 (H) 10/20/2015   HGBA1C 5.8 (H) 04/22/2015   Lab Results  Component Value Date  LDLCALC 121 (H) 10/20/2015   CREATININE 0.73 10/14/2018       Scheduled Meds: . doxycycline  100 mg Oral Q12H  . nicotine  14 mg Transdermal Daily  . pantoprazole (PROTONIX) IV  40 mg Intravenous Q12H  . sodium chloride flush  3 mL Intravenous Q12H   Continuous Infusions:    LOS: 0 days    Time spent: >30 MINS    Reyne Dumas  [facility] Pager [phone]. If 7PM-7AM, please contact  night-coverage at [email or web address], password [facility] 10/15/2018, 1:37 PM  LOS: 0 days

## 2018-10-15 NOTE — Progress Notes (Signed)
Subjective: Blood in stool much less.  Feels weak; is tired; no abdominal pain.  Objective: Vital signs in last 24 hours: Temp:  [98.8 F (37.1 C)-99.3 F (37.4 C)] 99.2 F (37.3 C) (02/05 0657) Pulse Rate:  [59-83] 63 (02/05 0657) Resp:  [15-18] 16 (02/05 0657) BP: (98-133)/(65-92) 107/71 (02/05 0657) SpO2:  [96 %-100 %] 96 % (02/05 0657) Weight change: -0.085 kg Last BM Date: 10/14/18  PE: GEN:  NAD ABD:  Soft, non-tender  Lab Results: CBC    Component Value Date/Time   WBC 5.0 10/15/2018 0528   RBC 4.37 10/15/2018 0528   HGB 12.9 10/15/2018 0528   HCT 41.2 10/15/2018 0528   PLT 287 10/15/2018 0528   MCV 94.3 10/15/2018 0528   MCH 29.5 10/15/2018 0528   MCHC 31.3 10/15/2018 0528   RDW 11.9 10/15/2018 0528   LYMPHSABS 3.4 10/13/2018 1529   MONOABS 0.6 10/13/2018 1529   EOSABS 0.1 10/13/2018 1529   BASOSABS 0.0 10/13/2018 1529   Assessment:  1.  GI bleed.  Improving.  Maybe NSAID-induced gastropathy.  Plan:  1.  PPI. 2.  Advance diet. 3.  Possibly go home tomorrow, if no further problems.   Samantha [name] 10/15/2018, 1:01 PM   Cell [phone] If no answer or after 5 PM call [phone]

## 2018-10-15 NOTE — Care Management Note (Signed)
Case Management Note  Patient Details  Name: Samantha [name] MRN: [id number] Date of Birth: [birth year]  Subjective/Objective: GIB. From home. No health insurance,no pcp.CM referral for resources. Provided w/health insurance info-pcp-encouraged [facility]-she can get her meds @ [facility] pharmacy @ d/c-to call @ d/c-patient voiced understanding.                   Action/Plan:d/c home.   Expected Discharge Date:  (unknown)               Expected Discharge Plan:  Home/Self Care  In-House Referral:     Discharge planning Services  CM Consult, [facility]  Post Acute Care Choice:    Choice offered to:     DME Arranged:    DME Agency:     HH Arranged:    HH Agency:     Status of Service:  In process, will continue to follow  If discussed at Long Length of Stay Meetings, dates discussed:    Additional Comments:  Dessa Phi, RN 10/15/2018, 10:49 AM

## 2018-10-16 DIAGNOSIS — R1112 Projectile vomiting: Secondary | ICD-10-CM

## 2018-10-16 DIAGNOSIS — R5081 Fever presenting with conditions classified elsewhere: Secondary | ICD-10-CM

## 2018-10-16 MED ORDER — ENSURE ENLIVE PO LIQD: 237.0000 mL | Freq: Two times a day (BID) | ORAL | Status: DC

## 2018-10-16 MED ORDER — PANTOPRAZOLE SODIUM 40 MG PO TBEC: 40.0000 mg | DELAYED_RELEASE_TABLET | Freq: Two times a day (BID) | ORAL | 3 refills | Status: DC

## 2018-10-16 MED ORDER — DOXYCYCLINE HYCLATE 100 MG PO TABS: 100.0000 mg | ORAL_TABLET | Freq: Two times a day (BID) | ORAL | 0 refills | Status: DC

## 2018-10-16 NOTE — Discharge Summary (Signed)
Physician Discharge Summary  Samantha [name] MRN: [id number] DOB/AGE: [birth year] 43 y.o.  PCP: Patient, No Pcp Per   Admit date: 10/13/2018 Discharge date: 10/16/2018  Discharge Diagnoses:    Active Problems:   GIB (gastrointestinal bleeding)    Follow-up recommendations Follow-up with PCP in 3-5 days , including all  additional recommended appointments as below Follow-up CBC, CMP in 3-5 days Patient would benefit from CT of chest including axilla to further evaluate hypoechoic well-defined nodules in the subcutaneous fat in the left axilla      Allergies as of 10/16/2018   No Known Allergies     Medication List    STOP taking these medications   amoxicillin-clavulanate 875-125 MG tablet Commonly known as:  AUGMENTIN   ibuprofen 800 MG tablet Commonly known as:  ADVIL,MOTRIN     TAKE these medications   doxycycline 100 MG tablet Commonly known as:  VIBRA-TABS Take 1 tablet (100 mg total) by mouth every 12 (twelve) hours.   escitalopram 10 MG tablet Commonly known as:  LEXAPRO Take 1 tablet (10 mg total) by mouth daily.   pantoprazole 40 MG tablet Commonly known as:  PROTONIX Take 1 tablet (40 mg total) by mouth 2 (two) times daily.        Discharge Condition stable  Discharge Instructions Get Medicines reviewed and adjusted: Please take all your medications with you for your next visit with your Primary MD  Please request your Primary MD to go over all hospital tests and procedure/radiological results at the follow up, please ask your Primary MD to get all Hospital records sent to his/her office.  If you experience worsening of your admission symptoms, develop shortness of breath, life threatening emergency, suicidal or homicidal thoughts you must seek medical attention immediately by calling 911 or calling your MD immediately  if symptoms less severe.  You must read complete instructions/literature along with all the possible adverse reactions/side effects for  all the Medicines you take and that have been prescribed to you. Take any new Medicines after you have completely understood and accpet all the possible adverse reactions/side effects.   Do not drive when taking Pain medications.   Do not take more than prescribed Pain, Sleep and Anxiety Medications  Special Instructions: If you have smoked or chewed Tobacco  in the last 2 yrs please stop smoking, stop any regular Alcohol  and or any Recreational drug use.  Wear Seat belts while driving.  Please note  You were cared for by a hospitalist during your hospital stay. Once you are discharged, your primary care physician will handle any further medical issues. Please note that NO REFILLS for any discharge medications will be authorized once you are discharged, as it is imperative that you return to your primary care physician (or establish a relationship with a primary care physician if you do not have one) for your aftercare needs so that they can reassess your need for medications and monitor your lab values.     No Known Allergies    Disposition: Discharge disposition: 01-Home or Self Care        Consults: GI    Significant Diagnostic Studies:  Dg Chest 2 View  Result Date: 10/15/2018 CLINICAL DATA:  Fever and shortness of breath. Smoker. EXAM: CHEST - 2 VIEW COMPARISON:  05/04/2018. FINDINGS: Normal sized heart. Clear lungs. Stable metallic pellets on the left. Mild thoracic spine degenerative changes. IMPRESSION: No acute abnormality. Electronically Signed   By: Claudie Revering M.D.   On: 10/15/2018  14:12   Dg Ankle Complete Left  Result Date: 09/26/2018 CLINICAL DATA:  Acute left ankle pain and swelling without known injury. EXAM: LEFT ANKLE COMPLETE - 3+ VIEW COMPARISON:  None. FINDINGS: There is no evidence of fracture, dislocation, or joint effusion. There is no evidence of arthropathy or other focal bone abnormality. Soft tissues are unremarkable. IMPRESSION: Negative.  Electronically Signed   By: Marijo Conception, M.D.   On: 09/26/2018 12:59   Ct Abdomen Pelvis W Contrast  Result Date: 10/13/2018 CLINICAL DATA:  43 year old female with 3 weeks of progressive abdominal pain, nausea vomiting. Chronic back pain. Recently diagnosed with pneumonia and started on Augmentin. EXAM: CT ABDOMEN AND PELVIS WITH CONTRAST TECHNIQUE: Multidetector CT imaging of the abdomen and pelvis was performed using the standard protocol following bolus administration of intravenous contrast. CONTRAST:  178mL ISOVUE-300 IOPAMIDOL (ISOVUE-300) INJECTION 61% COMPARISON:  Noncontrast CT Abdomen and Pelvis 05/04/2018. FINDINGS: Lower chest: The visible lung bases are stable and negative. No pleural or pericardial effusion. Hepatobiliary: Negative liver and gallbladder. Pancreas: Negative. Spleen: Negative. Adrenals/Urinary Tract: Normal adrenal glands. Bilateral renal enhancement and contrast excretion is symmetric and normal. Normal proximal ureters. Decompressed urinary bladder. Chronic pelvic phleboliths incidentally noted. Stomach/Bowel: Negative rectosigmoid colon. Negative descending colon. Retained stool in the proximal transverse colon and right colon which are redundant. Normal appendix (coronal image 32). No large bowel inflammation. Negative terminal ileum. No dilated small bowel. No mesenteric inflammatory stranding identified. There is a small chronic metallic clip or foreign body at the gastric cardia which is unchanged. Otherwise negative stomach. Negative duodenum. No free air, free fluid. Vascular/Lymphatic: Major arterial structures are patent. There is right iliac artery atherosclerosis. Portal venous system appears grossly patent. No lymphadenopathy. Reproductive: Negative uterus, retroverted. Low-density bilateral ovarian cysts larger on the right (series 2, image 63), but smaller than on the 2019 comparison. Other: No pelvic free fluid. Musculoskeletal: No acute osseous abnormality and  largely unremarkable for age. IMPRESSION: 1. Bilateral ovarian cysts, larger on the right but smaller than on a 2019 CT. 2. No acute or inflammatory process identified in the abdomen or pelvis. Normal appendix. 3. Visible lung bases are negative. Electronically Signed   By: Genevie Ann M.D.   On: 10/13/2018 22:40   Dg Abd 2 Views  Result Date: 10/13/2018 CLINICAL DATA:  Nausea and vomiting for 3 weeks EXAM: ABDOMEN - 2 VIEW COMPARISON:  CT abdomen and pelvis 05/04/2018 FINDINGS: Phleboliths in pelvis. Nonobstructive bowel gas pattern. No bowel dilatation or bowel wall thickening. Scattered stool throughout colon. Bones unremarkable. No definite urinary tract calcifications. IMPRESSION: Nonobstructive bowel gas pattern. Electronically Signed   By: Lavonia Dana M.D.   On: 10/13/2018 16:59   [place] Soft Tissue Non Vascular  Result Date: 10/14/2018 CLINICAL DATA:  Chronic left axillary pain. EXAM: ULTRASOUND LEFT UPPER EXTREMITY LIMITED TECHNIQUE: Ultrasound examination of the upper extremity soft tissues was performed in the area of clinical concern. COMPARISON:  None FINDINGS: There are 6 solid inhomogeneous hypoechoic well-defined nodules in the subcutaneous fat in the left axilla. The largest measures 2.5 x 2.1 x 0.5 cm. There is a single 2.0 x 1.3 x 0.8 cm lymph node with a benign fatty hilum. The other nodules do not have fatty hila. One of the nodules has slight increased vascularity. The underlying muscle appears normal. IMPRESSION: Multiple well-defined soft tissue nodules in the left axilla, not consistent with benign reactive lymph nodes. The nodules are nonspecific. Needle biopsy or CT scan of  the chest with contrast to look for other nodules should be considered. Electronically Signed   By: Lorriane Shire M.D.   On: 10/14/2018 08:38        Filed Weights   10/13/18 1444 10/13/18 1938 10/14/18 1137  Weight: 65.9 kg 65.8 kg 65.8 kg     Microbiology: Recent Results (from the past  240 hour(s))  Gastrointestinal Panel by PCR , Stool     Status: None   Collection Time: 10/13/18  8:39 PM  Result Value Ref Range Status   Campylobacter species NOT DETECTED NOT DETECTED Final   Plesimonas shigelloides NOT DETECTED NOT DETECTED Final   Salmonella species NOT DETECTED NOT DETECTED Final   Yersinia enterocolitica NOT DETECTED NOT DETECTED Final   Vibrio species NOT DETECTED NOT DETECTED Final   Vibrio cholerae NOT DETECTED NOT DETECTED Final   Enteroaggregative E coli (EAEC) NOT DETECTED NOT DETECTED Final   Enteropathogenic E coli (EPEC) NOT DETECTED NOT DETECTED Final   Enterotoxigenic E coli (ETEC) NOT DETECTED NOT DETECTED Final   Shiga like toxin producing E coli (STEC) NOT DETECTED NOT DETECTED Final   Shigella/Enteroinvasive E coli (EIEC) NOT DETECTED NOT DETECTED Final   Cryptosporidium NOT DETECTED NOT DETECTED Final   Cyclospora cayetanensis NOT DETECTED NOT DETECTED Final   Entamoeba histolytica NOT DETECTED NOT DETECTED Final   Giardia lamblia NOT DETECTED NOT DETECTED Final   Adenovirus F40/41 NOT DETECTED NOT DETECTED Final   Astrovirus NOT DETECTED NOT DETECTED Final   Norovirus GI/GII NOT DETECTED NOT DETECTED Final   Rotavirus A NOT DETECTED NOT DETECTED Final   Sapovirus (I, II, IV, and V) NOT DETECTED NOT DETECTED Final    Comment: Performed at [facility], [address] [place], [place] [address]       Blood Culture    Component Value Date/Time   SDES WOUND 09/26/2017 1034   SPECREQUEST AXILLA LEFT 09/26/2017 1034   CULT NORMAL SKIN FLORA 09/26/2017 1034   REPTSTATUS 09/29/2017 FINAL 09/26/2017 1034      Labs: Results for orders placed or performed during the hospital encounter of 10/13/18 (from the past 48 hour(s))  CBC     Status: None   Collection Time: 10/15/18  5:28 AM  Result Value Ref Range   WBC 5.0 4.0 - 10.5 K/uL   RBC 4.37 3.87 - 5.11 MIL/uL   Hemoglobin 12.9 12.0 - 15.0 g/dL   HCT 41.2 36.0 - 46.0 %   MCV  94.3 80.0 - 100.0 fL   MCH 29.5 26.0 - 34.0 pg   MCHC 31.3 30.0 - 36.0 g/dL   RDW 11.9 11.5 - 15.5 %   Platelets 287 150 - 400 K/uL   nRBC 0.0 0.0 - 0.2 %    Comment: Performed at [facility], [place] [address], [place], [place] [address]     Lipid Panel     Component Value Date/Time   CHOL 187 10/20/2015 0110   TRIG 53 10/20/2015 0110   HDL 55 10/20/2015 0110   CHOLHDL 3.4 10/20/2015 0110   VLDL 11 10/20/2015 0110   LDLCALC 121 (H) 10/20/2015 0110     Lab Results  Component Value Date   HGBA1C 5.9 (H) 10/20/2015   HGBA1C 5.8 (H) 04/22/2015     Lab Results  Component Value Date   LDLCALC 121 (H) 10/20/2015   CREATININE 0.73 10/14/2018     HPI  Mayci Davisis a 43 y.o.femalewith 2-3 weeks of black tarry stools several times per day  and coffee grounds emesis in the setting of chronic NSAID use (Motrin, Ibuprofen) that she takes multiple times per day for chronic back pain. Reports occasional red color to her stools. Has been having black loose stools for the last few weeks. Having right flank pain. Occasional heartburn. Occasional dysphagia. 6 pound unintentional weight loss in the past week. Denies having an EGD in the past. Occasional alcohol. Hgb 12.3. GI pathogen panel negative  HOSPITAL COURSE:  GI bleed With melanotic stools and coffee-ground emesis, seems to be improving, slight drop in hemoglobin from 14-12.3>12.9 Chronic NSAID use concerning for peptic ulcer disease, seen by gastroenterology,  Patient is status post EGD which showed NSAID induced gastropathy , continue Protonix 40 mg    twice a day Patient did not have any recurrence of bleeding prior to discharge Tolerating soft diet , she is either to go home today    hidadenitissuppurativa with skin / soft tissue infection in left axilla:started on doxycycline, unclear if there is a significant amount of purulence that would benefit from drainage, thus - [place] of axilla shows subcutaneous  nodules.  Recommend outpatient CT chest to include the axilla to further define these nodules  -Smoking:encouraged cessation,    -Right sided back pain:chronic and of unclear etiology; patient declines opioid pain medication, prefers trial of acetaminophen  Low-grade fever-99.2,  chest x-ray  Negative , UA was negative on admission    Discharge Exam:   Blood pressure 124/72, pulse 64, temperature 98.2 F (36.8 C), temperature source Oral, resp. rate 18, height 5\' 4"  (1.626 m), weight 65.8 kg, last menstrual period 10/10/2018, SpO2 97 %.   Cardiovascular system: S1 & S2 heard, RRR. No JVD, murmurs, rubs, gallops or clicks. No pedal edema. Gastrointestinal system: Abdomen is nondistended, soft and nontender. No organomegaly or masses felt. Normal bowel sounds heard. Central nervous system: Alert and oriented. No focal neurological deficits. Extremities: Symmetric 5 x 5 power. Skin: No rashes, lesions or ulcers   Follow-up Information    [facility]. Schedule an appointment as soon as possible for a visit.   Why:  once pcp appt set-can go to [facility]. [address] [place] [address]. pharmacy with scripts to fill. Contact information: [address] [place] [address]          Signed: Reyne Dumas 10/16/2018, 1:00 PM      Time needed to  prepare  discharge, discussed with the patient and family 35 minutes

## 2018-10-16 NOTE — Care Management Note (Signed)
Case Management Note  Patient Details  Name: Samantha [name] MRN: [id number] Date of Birth: 08-21-1976  Subjective/Objective: Patient reminded to call for pcp while in hospital, she can benefit from [facility] pharmacy for her meds @ d/c. Patient voiced understanding. No further CM needs.                   Action/Plan:dc home.   Expected Discharge Date:  (unknown)               Expected Discharge Plan:  Home/Self Care  In-House Referral:     Discharge planning Services  CM Consult, [facility]  Post Acute Care Choice:    Choice offered to:     DME Arranged:    DME Agency:     HH Arranged:    HH Agency:     Status of Service:  Completed, signed off  If discussed at [employer] of Stay Meetings, dates discussed:    Additional Comments:  Dessa Phi, RN 10/16/2018, 11:34 AM

## 2018-10-16 NOTE — Progress Notes (Signed)
Discharge instructions given to pt and all questions were answered. Pt taken down via wheelchair and was picked up by a taxi.

## 2018-10-16 NOTE — Progress Notes (Signed)
[facility] Gastroenterology Progress Note  Samantha [name] 43 y.o. May 04, 1976   Subjective: Denies abd pain/N/V. Has pain under her arms in the morning.  Objective: Vital signs: Vitals:   10/15/18 2057 10/16/18 0642  BP: 118/79 124/72  Pulse: 74 64  Resp: 18 18  Temp: 99.2 F (37.3 C) 98.2 F (36.8 C)  SpO2: 99% 97%    Physical Exam: Gen: lethargic, no acute distress  HEENT: anicteric sclera CV: RRR Chest: CTA B Abd: soft, nontender, nondistended, +BS   Lab Results: Recent Labs    10/13/18 1529 10/14/18 0448  NA 135 136  K 4.4 3.8  CL 103 107  CO2 23 23  GLUCOSE 90 81  BUN 13 14  CREATININE 0.71 0.73  CALCIUM 9.4 8.4*   Recent Labs    10/13/18 1529  AST 18  ALT 9  ALKPHOS 69  BILITOT 0.8  PROT 7.9  ALBUMIN 4.0   Recent Labs    10/13/18 1529 10/14/18 0448 10/15/18 0528  WBC 7.3 6.5 5.0  NEUTROABS 3.2  --   --   HGB 14.3 12.3 12.9  HCT 43.1 39.0 41.2  MCV 92.5 93.1 94.3  PLT 328 285 287      Assessment/Plan: S/P GI bleed likely due to NSAIDs. Stable. Hgb 12.9. No further bleeding. Ok to d/c from GI standpoint on PPI PO QD. Avoid NSAIDs. F/U with me in 3-4 weeks. Will sign off. Call if questions.   [name] 10/16/2018, 11:19 AM  Questions please call [phone] ID: Ihor Austin, female   DOB: 28-Jan-1976, 43 y.o.   MRN: [id number]

## 2018-10-20 MED FILL — PANTOPRAZOLE SOD DR 40 MG T: 40 | 30 days supply | Qty: 60 | Fill #0

## 2018-10-20 MED FILL — DOXYCYCLINE HYCLATE 100 MG: 100 | 30 days supply | Qty: 60 | Fill #0

## 2018-11-10 ENCOUNTER — Telehealth: Payer: Self-pay | Admitting: Family Medicine

## 2018-11-10 ENCOUNTER — Ambulatory Visit (INDEPENDENT_AMBULATORY_CARE_PROVIDER_SITE_OTHER): Payer: Self-pay | Admitting: Family Medicine

## 2018-11-10 ENCOUNTER — Encounter: Payer: Self-pay | Admitting: Family Medicine

## 2018-11-10 VITALS — BP 104/72 | HR 83 | Temp 99.1°F | Resp 17 | Ht 64.0 in | Wt 151.0 lb

## 2018-11-10 DIAGNOSIS — Z1231 Encounter for screening mammogram for malignant neoplasm of breast: Secondary | ICD-10-CM

## 2018-11-10 DIAGNOSIS — F329 Major depressive disorder, single episode, unspecified: Secondary | ICD-10-CM

## 2018-11-10 DIAGNOSIS — R4589 Other symptoms and signs involving emotional state: Secondary | ICD-10-CM

## 2018-11-10 DIAGNOSIS — M79675 Pain in left toe(s): Secondary | ICD-10-CM

## 2018-11-10 DIAGNOSIS — M79676 Pain in unspecified toe(s): Secondary | ICD-10-CM

## 2018-11-10 DIAGNOSIS — K279 Peptic ulcer, site unspecified, unspecified as acute or chronic, without hemorrhage or perforation: Secondary | ICD-10-CM

## 2018-11-10 DIAGNOSIS — Z131 Encounter for screening for diabetes mellitus: Secondary | ICD-10-CM

## 2018-11-10 DIAGNOSIS — M79674 Pain in right toe(s): Secondary | ICD-10-CM

## 2018-11-10 DIAGNOSIS — M5134 Other intervertebral disc degeneration, thoracic region: Secondary | ICD-10-CM

## 2018-11-10 DIAGNOSIS — R590 Localized enlarged lymph nodes: Secondary | ICD-10-CM

## 2018-11-10 DIAGNOSIS — R5383 Other fatigue: Secondary | ICD-10-CM

## 2018-11-10 DIAGNOSIS — L732 Hidradenitis suppurativa: Secondary | ICD-10-CM

## 2018-11-10 DIAGNOSIS — G43109 Migraine with aura, not intractable, without status migrainosus: Secondary | ICD-10-CM

## 2018-11-10 MED ORDER — CYCLOBENZAPRINE HCL 10 MG PO TABS: 10.0000 mg | ORAL_TABLET | Freq: Three times a day (TID) | ORAL | 0 refills | Status: DC | PRN

## 2018-11-10 MED ORDER — PROPRANOLOL HCL 10 MG PO TABS: 10.0000 mg | ORAL_TABLET | Freq: Four times a day (QID) | ORAL | 1 refills | Status: DC | PRN

## 2018-11-10 MED ORDER — ESCITALOPRAM OXALATE 10 MG PO TABS: 10.0000 mg | ORAL_TABLET | Freq: Every day | ORAL | 0 refills | Status: DC

## 2018-11-10 MED ORDER — DOXYCYCLINE HYCLATE 100 MG PO TABS: 100.0000 mg | ORAL_TABLET | Freq: Two times a day (BID) | ORAL | 5 refills | Status: DC

## 2018-11-10 MED FILL — PROPRANOLOL 10 MG TABLET: 10 | 15 days supply | Qty: 60 | Fill #0

## 2018-11-10 MED FILL — ESCITALOPRAM 10 MG TABLET: 10 | 30 days supply | Qty: 30 | Fill #0

## 2018-11-10 MED FILL — CYCLOBENZAPRINE 10 MG TAB: 10 | 10 days supply | Qty: 30 | Fill #0

## 2018-11-10 NOTE — Patient Instructions (Addendum)
I am continuing your Doxycyline daily until we can get you established with a dermatologist.   Take all other medications as prescribed.  To refill medication: Contact your pharmacy and your pharmacy will handle all refill request. When calling the pharmacy, please listen to automated message for instructions regarding refilling your medication. To avoid delays, request refills of medication in at least 5 days of completing your last dose.   Complete financial assistance paperwork.  I have ordered your mammogram.    Hidradenitis Suppurativa Hidradenitis suppurativa is a long-term (chronic) skin disease. It is similar to a severe form of acne, but it affects areas of the body where acne would be unusual, especially areas of the body where skin rubs against skin and becomes moist. These include:  Underarms.  Groin.  Genital area.  Buttocks.  Upper thighs.  Breasts. Hidradenitis suppurativa may start out as small lumps or pimples caused by blocked sweat glands or hair follicles. Pimples may develop into deep sores that break open (rupture) and drain pus. Over time, affected areas of skin may thicken and become scarred. This condition is rare and does not spread from person to person (non-contagious). What are the causes? The exact cause of this condition is not known. It may be related to:  Female and female hormones.  An overactive disease-fighting system (immune system). The immune system may over-react to blocked hair follicles or sweat glands and cause swelling and pus-filled sores. What increases the risk? You are more likely to develop this condition if you:  Are female.  Are 33-62 years old.  Have a family history of hidradenitis suppurativa.  Have a personal history of acne.  Are overweight.  Smoke.  Take the medicine lithium. What are the signs or symptoms? The first symptoms are usually painful bumps in the skin, similar to pimples. The condition may get worse  over time (progress), or it may only cause mild symptoms. If the disease progresses, symptoms may include:  Skin bumps getting bigger and growing deeper into the skin.  Bumps rupturing and draining pus.  Itchy, infected skin.  Skin getting thicker and scarred.  Tunnels under the skin (fistulas) where pus drains from a bump.  Pain during daily activities, such as pain during walking if your groin area is affected.  Emotional problems, such as stress or depression. This condition may affect your appearance and your ability or willingness to wear certain clothes or do certain activities. How is this diagnosed? This condition is diagnosed by a health care provider who specializes in skin diseases (dermatologist). You may be diagnosed based on:  Your symptoms and medical history.  A physical exam.  Testing a pus sample for infection.  Blood tests. How is this treated? Your treatment will depend on how severe your symptoms are. The same treatment will not work for everybody with this condition. You may need to try several treatments to find what works best for you. Treatment may include:  Cleaning and bandaging (dressing) your wounds as needed.  Lifestyle changes, such as new skin care routines.  Taking medicines, such as: ? Antibiotics. ? Acne medicines. ? Medicines to reduce the activity of the immune system. ? A diabetes medicine (metformin). ? Birth control pills, for women. ? Steroids to reduce swelling and pain.  Working with a mental health care provider, if you experience emotional distress due to this condition. If you have severe symptoms that do not get better with medicine, you may need surgery. Surgery may involve:  Using  a laser to clear the skin and remove hair follicles.  Opening and draining deep sores.  Removing the areas of skin that are diseased and scarred. Follow these instructions at home: Medicines   Take over-the-counter and prescription  medicines only as told by your health care provider.  If you were prescribed an antibiotic medicine, take it as told by your health care provider. Do not stop taking the antibiotic even if your condition improves. Skin care  If you have open wounds, cover them with a clean dressing as told by your health care provider. Keep wounds clean by washing them gently with soap and water when you bathe.  Do not shave the areas where you get hidradenitis suppurativa.  Do not wear deodorant.  Wear loose-fitting clothes.  Try to avoid getting overheated or sweaty. If you get sweaty or wet, change into clean, dry clothes as soon as you can.  To help relieve pain and itchiness, cover sore areas with a warm, clean washcloth (warm compress) for 5-10 minutes as often as needed.  If told by your health care provider, take a bleach bath twice a week: ? Fill your bathtub halfway with water. ? Pour in  cup of unscented household bleach. ? Soak in the tub for 5-10 minutes. ? Only soak from the neck down. Avoid water on your face and hair. ? Shower to rinse off the bleach from your skin. General instructions  Learn as much as you can about your disease so that you have an active role in your treatment. Work closely with your health care provider to find treatments that work for you.  If you are overweight, work with your health care provider to lose weight as recommended.  Do not use any products that contain nicotine or tobacco, such as cigarettes and e-cigarettes. If you need help quitting, ask your health care provider.  If you struggle with living with this condition, talk with your health care provider or work with a mental health care provider as recommended.  Keep all follow-up visits as told by your health care provider. This is important. Where to find more information  Hidradenitis [place].: https://www.hs-foundation.org/ Contact a health care provider if you have:  A  flare-up of hidradenitis suppurativa.  A fever or chills.  Trouble controlling your symptoms at home.  Trouble doing your daily activities because of your symptoms.  Trouble dealing with emotional problems related to your condition. Summary  Hidradenitis suppurativa is a long-term (chronic) skin disease. It is similar to a severe form of acne, but it affects areas of the body where acne would be unusual.  The first symptoms are usually painful bumps in the skin, similar to pimples. The condition may get worse over time (progress), or it may only cause mild symptoms.  If you have open wounds, cover them with a clean dressing as told by your health care provider. Keep wounds clean by washing them gently with soap and water when you bathe.  Besides skin care, treatment may include medicines, laser treatment, and surgery. This information is not intended to replace advice given to you by your health care provider. Make sure you discuss any questions you have with your health care provider. Document Released: 04/10/2004 Document Revised: 09/04/2017 Document Reviewed: 09/04/2017 Elsevier Interactive Patient Education  2019 [employer].

## 2018-11-10 NOTE — Telephone Encounter (Signed)
Schedule CT of chest at [facility] if possible

## 2018-11-10 NOTE — Progress Notes (Signed)
Samantha [name], is a 43 y.o. female  FVC:[id number]  MBW:[id number]  DOB - 1976-01-11  CC:  Chief Complaint  Patient presents with  . Establish Care  . Hospitalization Follow-up    ED->Hosp 2/3-2/6: GI bleed, N&V, unintentional weight loss       HPI: Samantha [name] is a 43 y.o. female is here today to establish care.   Samantha [name] has History of abnormal Pap smear; History of PID; TOA (tubo-ovarian abscess); 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[place]); Hyperlipidemia; Smoker; TIA (transient ischemic attack); Palpitations; Decreased appetite; Sinusitis; Complicated migraine; Marijuana use; Basilar migraine; Hidradenitis suppurativa; Acute pericarditis; Back pain; Caffeine abuse, continuous ([place]); Adjustment disorder with mixed anxiety and depressed mood; Depressed mood; Amenorrhea; and GIB (gastrointestinal bleeding) on their problem list.    Patient is without health insurance and had been lost to routine primary care follow-up. She is a chronic daily smoker. History detail in problem list.  GI Bleed and unintended weight loss Admitted with complaint of dark stool with coffee ground emesis, thought to be related chronic NSAID use affecting PUD. She was started on Protonix 40 mg twice daily s/p EGD which confirmed gastropathy. She was discharged able to tolerated PO intake. Hemoglobin stable at 12.9. Patient denies any further NSAID use, N&V, and is compliant with Protonix therapy.  Hidradenitis suppurativa  hospital follow-up. Long history of hidradenitis. At some point she has started treatment under dermatology, however lost health insurance was unable to continue. Recently hospitalized to impatient services at [facility] underwent ultrasound left upper extremity which impression was significant "Multiple well-defined soft tissue nodules in the left axilla, not consistent with benign reactive lymph nodes. The nodules are nonspecific". Radiology recommended a follow-up CT of chest. In view of prior mammogram ,  nodules were identified by mammogram.  She is overdue for mammogram this year. She completed Doxycyline and continues to have multiple painful abscess type nodules which are non-draining at present under her left axilla.  Abscess mostly occur in her axilla and groin region. She denies fever, chills, or N&V.   Basilar migraines  Complains of recent recurrence of headaches. She has been chronically taking NSAID'S for headaches. Since recent impatient stay for GI bleed she hasn't taken any medication. Attributes current HA to stress, anxiety, and depression. She has a chronic history of mental health disorder. Denies suicidal ideations, homicidal ideations, or auditory hallucinations.  Uncertain of medications previously prescribed for headaches. HA are causing profound fatigue.  Degenerative disc disease  Suffers from DDD. Pain is 8-10/10 on most days. She is inactive most of day due to pain. DDD confirmed by imaging several years ago. Lost insurance, unable to see specialist. Denies gait imbalance, numbness,  tingling of lower extremities, or  cauda equina symptoms.  Bilateral great toe pain.  Ongoing for more than 1 month. No history of Gout. New problem and has never occurred before.   Current medications: Current Outpatient Medications:  .  doxycycline (VIBRA-TABS) 100 MG tablet, Take 1 tablet (100 mg total) by mouth every 12 (twelve) hours., Disp: 60 tablet, Rfl: 0 .  escitalopram (LEXAPRO) 10 MG tablet, Take 1 tablet (10 mg total) by mouth daily., Disp: 30 tablet, Rfl: 0 .  pantoprazole (PROTONIX) 40 MG tablet, Take 1 tablet (40 mg total) by mouth 2 (two) times daily., Disp: 60 tablet, Rfl: 3   Pertinent family medical history: family history includes Cancer in her sister; Diabetes in her father, maternal grandmother, and sister; Hypertension in her mother; Lupus in her mother; Thyroid disease in  her sister.   No Known Allergies  Social History   Socioeconomic History  . Marital status:  Single    Spouse name: Not on file  . Number of children: 1  . Years of education: 56  . Highest education level: Not on file  Occupational History  . Not on file  Social Needs  . Financial resource strain: Not on file  . Food insecurity:    Worry: Not on file    Inability: Not on file  . Transportation needs:    Medical: Not on file    Non-medical: Not on file  Tobacco Use  . Smoking status: Current Every Day Smoker    Packs/day: 1.00    Years: 17.00    Pack years: 17.00    Types: Cigars  . Smokeless tobacco: Never Used  . Tobacco comment: 3 cigarettes a day   Substance and Sexual Activity  . Alcohol use: Yes    Alcohol/week: 1.0 standard drinks    Types: 1 Shots of liquor per week    Comment: drink on the weekends  . Drug use: Yes    Frequency: 3.0 times per week    Types: Marijuana  . Sexual activity: Not Currently    Birth control/protection: None  Lifestyle  . Physical activity:    Days per week: 7 days    Minutes per session: 30 min  . Stress: Rather much  Relationships  . Social connections:    Talks on phone: Three times a week    Gets together: More than three times a week    Attends religious service: 1 to 4 times per year    Active member of club or organization: No    Attends meetings of clubs or organizations: Never    Relationship status: Never married  . Intimate partner violence:    Fear of current or ex partner: No    Emotionally abused: No    Physically abused: No    Forced sexual activity: No  Other Topics Concern  . Not on file  Social History Narrative   Patient drinks sodas all day long.   Patient is right handed.     Review of Systems: Pertinent negatives listed in HPI  Objective:   Vitals:   11/10/18 1504  BP: 104/72  Pulse: 83  Resp: 17  Temp: 99.1 F (37.3 C)  SpO2: 96%    BP Readings from Last 3 Encounters:  11/10/18 104/72  10/16/18 124/72  10/05/18 119/87    Filed Weights   11/10/18 1504  Weight: 151 lb (68.5  kg)      Physical Exam: Constitutional: Patient appears well-developed and well-nourished. No distress. HENT: Normocephalic, atraumatic, External right and left ear normal. Oropharynx is clear and moist.  Eyes: Conjunctivae and EOM are normal. PERRLA, no scleral icterus. Neck: Normal ROM. Neck supple. No JVD. No tracheal deviation. No thyromegaly. CVS: RRR, S1/S2 +, no murmurs, no gallops, no carotid bruit.  Pulmonary: Effort and breath sounds normal, no stridor, rhonchi, wheezes, rales.  Abdominal: Soft. BS +, no distension, tenderness, rebound or guarding.  Musculoskeletal: Normal range of motion. No edema and no tenderness.  Neuro: Alert. Normal muscle tone coordination. Normal gait. BUE and BLE strength 5/5. Bilateral hand grips symmetrical. Skin: multiple nodular type close cyst left axilla.   Psychiatric:flat affect and depressed mood. Normal judgement and normal speech.  Lab Results  Component Value Date   WBC 5.0 10/15/2018   HGB 12.9 10/15/2018   HCT 41.2 10/15/2018  MCV 94.3 10/15/2018   PLT 287 10/15/2018   Lab Results  Component Value Date   CREATININE 0.73 10/14/2018   BUN 14 10/14/2018   NA 136 10/14/2018   K 3.8 10/14/2018   CL 107 10/14/2018   CO2 23 10/14/2018    Lab Results  Component Value Date   HGBA1C 5.9 (H) 10/20/2015       Component Value Date/Time   CHOL 187 10/20/2015 0110   TRIG 53 10/20/2015 0110   HDL 55 10/20/2015 0110   CHOLHDL 3.4 10/20/2015 0110   VLDL 11 10/20/2015 0110   LDLCALC 121 (H) 10/20/2015 0110        Assessment and plan:  1. Basilar migraine -Trial propranolol -check CMP   2. Hidradenitis suppurativa -Will continue Doxycyline continuously while trying to have patient establish with dermatology  - Comprehensive metabolic panel  3. Breast cancer screening by mammogram - MM Digital Screening; Future, scholarship form provided  4. Pain of great toe, unspecified laterality - Uric Acid, if negative, recommend  tylenol and warm foot soaks Once Financial assistance approved, will consider referring to podiatry if not improved  5. Screening for diabetes mellitus - Hemoglobin A1c  6. Fatigue, unspecified type - CBC with Differential - Thyroid Panel With TSH  7. Degenerative disc disease, thoracic -trial cyclobenzaprine   8. Localized enlarged lymph nodes - CT CHEST LIMITED W CONTRAST; Futureer.  9. PUD, controlled -continue Protonix -encouraged smoking cessation  10. Depressed mood -resume Lexapro 10 mg once daily    No follow-ups on file.    Meds ordered this encounter  Medications  . propranolol (INDERAL) 10 MG tablet    Sig: Take 1 tablet (10 mg total) by mouth 4 (four) times daily as needed.    Dispense:  60 tablet    Refill:  1  . cyclobenzaprine (FLEXERIL) 10 MG tablet    Sig: Take 1 tablet (10 mg total) by mouth 3 (three) times daily as needed for muscle spasms.    Dispense:  30 tablet    Refill:  0  . escitalopram (LEXAPRO) 10 MG tablet    Sig: Take 1 tablet (10 mg total) by mouth at bedtime.    Dispense:  60 tablet    Refill:  0  . doxycycline (VIBRA-TABS) 100 MG tablet    Sig: Take 1 tablet (100 mg total) by mouth 2 (two) times daily.    Dispense:  60 tablet    Refill:  5    The patient was given clear instructions to go to ER or return to medical center if symptoms don't improve, worsen or new problems develop. The patient verbalized understanding. The patient was advised  to call and obtain lab results if they haven't heard anything from out office within 7-10 business days.  Molli Barrows, FNP Primary Care at [facility] [address] [place] [address] [phone]: [phone]    This note has been created with Dragon speech recognition software and Engineer, materials. Any transcriptional errors are unintentional.

## 2018-11-11 LAB — COMPREHENSIVE METABOLIC PANEL
ALT: 6 IU/L (ref 0–32)
AST: 15 IU/L (ref 0–40)
Albumin/Globulin Ratio: 1.7 (ref 1.2–2.2)
Albumin: 4.4 g/dL (ref 3.8–4.8)
Alkaline Phosphatase: 72 IU/L (ref 39–117)
BUN/Creatinine Ratio: 14 (ref 9–23)
BUN: 11 mg/dL (ref 6–24)
Bilirubin Total: 0.3 mg/dL (ref 0.0–1.2)
CO2: 23 mmol/L (ref 20–29)
Calcium: 9.2 mg/dL (ref 8.7–10.2)
Chloride: 103 mmol/L (ref 96–106)
Creatinine, Ser: 0.77 mg/dL (ref 0.57–1.00)
GFR calc Af Amer: 111 mL/min/{1.73_m2} (ref >59)
GFR, EST NON AFRICAN AMERICAN: 96 mL/min/{1.73_m2} (ref >59)
GLOBULIN, TOTAL: 2.6 g/dL (ref 1.5–4.5)
Glucose: 87 mg/dL (ref 65–99)
Potassium: 4.5 mmol/L (ref 3.5–5.2)
Sodium: 137 mmol/L (ref 134–144)
Total Protein: 7 g/dL (ref 6.0–8.5)

## 2018-11-11 LAB — CBC WITH DIFFERENTIAL/PLATELET
Basophils Absolute: 0 10*3/uL (ref 0.0–0.2)
Basos: 1 %
EOS (ABSOLUTE): 0.1 10*3/uL (ref 0.0–0.4)
EOS: 1 %
Hematocrit: 40.5 % (ref 34.0–46.6)
Hemoglobin: 13.6 g/dL (ref 11.1–15.9)
IMMATURE GRANS (ABS): 0 10*3/uL (ref 0.0–0.1)
Immature Granulocytes: 0 %
Lymphocytes Absolute: 2.9 10*3/uL (ref 0.7–3.1)
Lymphs: 41 %
MCH: 30.7 pg (ref 26.6–33.0)
MCHC: 33.6 g/dL (ref 31.5–35.7)
MCV: 91 fL (ref 79–97)
Monocytes Absolute: 0.5 10*3/uL (ref 0.1–0.9)
Monocytes: 7 %
Neutrophils Absolute: 3.5 10*3/uL (ref 1.4–7.0)
Neutrophils: 50 %
Platelets: 287 10*3/uL (ref 150–450)
RBC: 4.43 x10E6/uL (ref 3.77–5.28)
RDW: 13.1 % (ref 11.7–15.4)
WBC: 7.1 10*3/uL (ref 3.4–10.8)

## 2018-11-11 LAB — URIC ACID: Uric Acid: 3.6 mg/dL (ref 2.5–7.1)

## 2018-11-11 LAB — THYROID PANEL WITH TSH
FREE THYROXINE INDEX: 2.4 (ref 1.2–4.9)
T3 Uptake Ratio: 26 % (ref 24–39)
T4, Total: 9.3 ug/dL (ref 4.5–12.0)
TSH: 1.08 u[IU]/mL (ref 0.450–4.500)

## 2018-11-11 LAB — HEMOGLOBIN A1C
Est. average glucose Bld gHb Est-mCnc: 117 mg/dL
HEMOGLOBIN A1C: 5.7 % — AB (ref 4.8–5.6)

## 2018-11-11 NOTE — Telephone Encounter (Signed)
LVM

## 2018-11-11 NOTE — Telephone Encounter (Signed)
Patient's CT is scheduled for 2:15 PM at [facility] on 11/17/2018.  Can you notify patient of details?

## 2018-11-12 NOTE — Telephone Encounter (Signed)
Called patient, notified her of appointment. Had no further questions.

## 2018-11-17 ENCOUNTER — Encounter (HOSPITAL_COMMUNITY): Payer: Self-pay

## 2018-11-17 ENCOUNTER — Ambulatory Visit (HOSPITAL_COMMUNITY)
Admission: RE | Admit: 2018-11-17 | Discharge: 2018-11-17 | Disposition: A | Discharge status: Home / Self Care | Payer: Self-pay | Source: Ambulatory Visit | Attending: Family Medicine | Admitting: Family Medicine

## 2018-11-17 ENCOUNTER — Ambulatory Visit (HOSPITAL_COMMUNITY): Admission: RE | Admit: 2018-11-17 | Payer: Self-pay | Source: Ambulatory Visit

## 2018-11-17 DIAGNOSIS — R590 Localized enlarged lymph nodes: Secondary | ICD-10-CM | POA: Insufficient documentation

## 2018-11-17 HISTORY — DX: Unspecified asthma, uncomplicated: J45.909

## 2018-11-17 MED ORDER — IOHEXOL 300 MG/ML  SOLN
75.0000 mL | Freq: Once | INTRAMUSCULAR | Status: AC | PRN
  Administered 2018-11-17: 75 mL via INTRAVENOUS

## 2018-11-17 MED ORDER — SODIUM CHLORIDE (PF) 0.9 % IJ SOLN
INTRAMUSCULAR | Status: AC
  Filled 2018-11-17: qty 50

## 2018-11-17 NOTE — Addendum Note (Signed)
Addended by: Scot Jun on: 11/17/2018 10:43 AM   Modules accepted: Orders

## 2018-11-18 ENCOUNTER — Ambulatory Visit (INDEPENDENT_AMBULATORY_CARE_PROVIDER_SITE_OTHER): Payer: Self-pay | Admitting: Licensed Clinical Social Worker

## 2018-11-18 DIAGNOSIS — F339 Major depressive disorder, recurrent, unspecified: Secondary | ICD-10-CM

## 2018-11-18 DIAGNOSIS — F419 Anxiety disorder, unspecified: Secondary | ICD-10-CM

## 2018-11-19 ENCOUNTER — Telehealth: Payer: Self-pay | Admitting: Family Medicine

## 2018-11-19 NOTE — Telephone Encounter (Signed)
Left voice mail to call back 

## 2018-11-19 NOTE — Telephone Encounter (Signed)
See My Chart results message below sent to patient. Please follow-up via phone to ensure she has review the findings:  Samantha [name],   I have reviewed your recent CT of the chest with contrast and there were no concerning findings regarding the nodule seen on ultrasound. Those nodules are thought to be superficial skin nodules likely related to your hydradenitis. As we spoke of before complete financial assistance that we can refer you to dermatology. An incidental finding on the CT of the chest revealed that you have bronchial markings consistent with emphysema. This is related to smoking. Highly encourage you to quit smoking. If you have symptoms of shortness of breath and/or wheezing is follow-up here in office for evaluation.

## 2018-11-20 MED ORDER — ALBUTEROL SULFATE HFA 108 (90 BASE) MCG/ACT IN AERS: 2.0000 | INHALATION_SPRAY | RESPIRATORY_TRACT | 1 refills | Status: DC | PRN

## 2018-11-20 MED FILL — !PROVENTIL HFA 90 MCG INH: 108 (90 BAS | 25 days supply | Qty: 1 | Fill #0

## 2018-11-20 NOTE — Telephone Encounter (Signed)
Patient was placed on cyclobenzaprine for back pain.  She recently had a GI bleed therefore NSAIDs including meloxicam are contraindicated.  Notify the patient.  I think we did discuss this during her visit that she should avoid all NSAIDs and take Tylenol along with the cyclobenzaprine to achieve maximal pain relief.  I will go ahead and send over an albuterol inhaler for her to have as needed for her shortness of breath.  Asked patient if shortness of breath worsens  please schedule a follow-up visit.

## 2018-11-20 NOTE — Telephone Encounter (Signed)
Patient called requesting CT results, please follow up.

## 2018-11-20 NOTE — Telephone Encounter (Signed)
Patient notified of CT results & recommendations expressed understanding. States that she has some shortness when walking long distances. She hasn't noticed any wheezing though.  While on the phone I also went over her lab results. She states that she would like a Rx for Meloxicam sent to [place].

## 2018-11-21 NOTE — BH Specialist Note (Addendum)
Integrated Behavioral Health Initial Visit  MRN: [id number] Name: Samantha [name]  Number of [place] Clinician visits:: 1/6 Session Start time: 4:10 PM  Session End time: 4:50 PM Total time: 40 minutes  Type of Service: [place] Interpretor:No. Interpretor Name and Language: N/A   SUBJECTIVE: Samantha [name] is a 43 y.o. female accompanied by Sibling Patient was referred by FNP Kenton Kingfisher for depression and anxiety. Patient reports the following symptoms/concerns: Pt reports difficulty managing mental health. She experiences grief of loved ones, approximately three who died on the same street as residence.  Duration of problem: Ongoing; Severity of problem: severe  OBJECTIVE: Mood: Dysphoric and Affect: Depressed Risk of harm to self or others: Suicidal ideation No plan to harm self or others Pt scored positive on phq9; however, denies current suicidal/homocidal ideations. Protective factors identified, safety plan discussed, and crisis intervention resources provided  LIFE CONTEXT: Family and Social: Pt receives strong support from family. Sister was present at visit today. Pt has an adult daughter and is close with niece and nephew.  School/Work: Pt receives housing services through [employer]. Is interested in section 8 voucher. Has pending social security noting upcoming telephone interview on 11/21/2018 Self-Care: Pt smokes marijuana to cope with stressors. States that she drinks alcohol for social occasions. Participates in medication management through PCP Life Changes: Pt has difficulty coping with depression and anxiety triggered by grief and housing concerns  GOALS ADDRESSED: Patient will: 1. Reduce symptoms of: anxiety and depression 2. Increase knowledge and/or ability of: coping skills  3. Demonstrate ability to: Increase healthy adjustment to current life circumstances and Increase motivation to adhere to plan of  care  INTERVENTIONS: Interventions utilized: Solution-Focused Strategies, Supportive Counseling and Psychoeducation and/or Health Education  Standardized Assessments completed: GAD-7 and PHQ 2&9 with C-SSRS  ASSESSMENT: Patient currently experiencing depression and anxiety triggered by grief of loved ones and housing concerns. Pt shared that approximately three loved ones died on her street. She receives strong support from family, sister accompanied pt at visit. Pt scored positive on phq9; however, denies current suicidal/homocidal ideations. Protective factors identified, safety plan discussed, and crisis intervention resources provided.   Patient may benefit from psychotherapy. LCSWA educated family on the correlation between one's physical and mental health, in addition, to how grief can negatively impact both. Stages of grief were discussed. Therapeutic strategies were discussed to decrease and/or manage symptoms were discussed. Pt has been taking medication and reports decrease in suicidal ideations, feelings of sadness, and panic attacks.   Pt is interested in obtaining an emotional support animal to assist with behavioral activation. Supportive resources were discussed and mailed to patient's residence for food insecurity.   PLAN: 1. Follow up with behavioral health clinician on : Pt was encouraged to contact LCSWA if symptoms worsen or fail to improve to schedule behavioral appointments. 2. Behavioral recommendations: LCSWA recommends that pt apply healthy coping skills discussed and utilize provided resources. Pt is encouraged to schedule follow up appointment with LCSWA 3. Referral(s): [place] (In Clinic) and [employer] Resources:  Food 4. "From scale of 1-10, how likely are you to follow plan?":   Rebekah [place]field, LCSW 11/21/2018 10:57 AM

## 2018-11-21 NOTE — Telephone Encounter (Signed)
Patient notified of this information.

## 2018-11-27 ENCOUNTER — Encounter: Payer: Self-pay | Admitting: Family Medicine

## 2018-11-27 ENCOUNTER — Telehealth: Payer: Self-pay | Admitting: Family Medicine

## 2018-11-27 MED FILL — PANTOPRAZOLE SOD DR 40 MG T: 40 | 30 days supply | Qty: 60 | Fill #1

## 2018-11-27 MED FILL — DOXYCYCLINE HYCLATE 100 MG: 100 | 30 days supply | Qty: 60 | Fill #0

## 2018-11-27 NOTE — Telephone Encounter (Signed)
-----   Message from Scot Jun, [place] sent at 11/24/2018  7:49 AM EDT ----- [place]!  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 She also would like for you to write a letter for an emotional support animal to assist with behavioral activation.  Thanks!

## 2018-11-27 NOTE — Telephone Encounter (Signed)
Called patient, stated she would pick them up shortly.

## 2018-11-27 NOTE — Telephone Encounter (Signed)
Contact patient to advise that both letters she requested for social services and for recommendation for an emotional support animal have been completed and are available at the front desk.

## 2019-01-13 ENCOUNTER — Other Ambulatory Visit: Payer: Self-pay | Admitting: Family Medicine

## 2019-01-13 MED FILL — PANTOPRAZOLE SOD DR 40 MG T: 40 | 30 days supply | Qty: 60 | Fill #2

## 2019-01-13 MED FILL — DOXYCYCLINE HYCLATE 100 MG: 100 | 30 days supply | Qty: 60 | Fill #1

## 2019-01-14 MED FILL — CYCLOBENZAPRINE 10 MG TAB: 10 | 10 days supply | Qty: 30 | Fill #0

## 2019-02-24 ENCOUNTER — Other Ambulatory Visit: Payer: Self-pay

## 2019-02-24 ENCOUNTER — Ambulatory Visit
Admission: RE | Admit: 2019-02-24 | Discharge: 2019-02-24 | Disposition: A | Discharge status: Home / Self Care | Payer: Self-pay | Source: Ambulatory Visit | Attending: Family Medicine | Admitting: Family Medicine

## 2019-02-24 DIAGNOSIS — Z1231 Encounter for screening mammogram for malignant neoplasm of breast: Secondary | ICD-10-CM

## 2019-03-03 MED FILL — DOXYCYCLINE HYCLATE 100 MG: 100 | 30 days supply | Qty: 60 | Fill #2

## 2019-03-03 MED FILL — PANTOPRAZOLE SOD DR 40 MG T: 40 | 30 days supply | Qty: 60 | Fill #3

## 2019-03-12 DIAGNOSIS — Z0271 Encounter for disability determination: Secondary | ICD-10-CM

## 2019-03-19 ENCOUNTER — Encounter (HOSPITAL_COMMUNITY): Payer: Self-pay

## 2019-03-19 ENCOUNTER — Ambulatory Visit (HOSPITAL_COMMUNITY)
Admission: EM | Admit: 2019-03-19 | Discharge: 2019-03-19 | Disposition: A | Discharge status: Home / Self Care | Payer: No Typology Code available for payment source | Attending: Family Medicine | Admitting: Family Medicine

## 2019-03-19 ENCOUNTER — Other Ambulatory Visit: Payer: Self-pay

## 2019-03-19 DIAGNOSIS — M542 Cervicalgia: Secondary | ICD-10-CM

## 2019-03-19 MED ORDER — PREDNISONE 10 MG (21) PO TBPK: ORAL_TABLET | ORAL | 0 refills | Status: DC

## 2019-03-19 MED ORDER — CYCLOBENZAPRINE HCL 10 MG PO TABS: 10.0000 mg | ORAL_TABLET | Freq: Three times a day (TID) | ORAL | 0 refills | Status: DC | PRN

## 2019-03-19 MED ORDER — TRAMADOL HCL 50 MG PO TABS: 50.0000 mg | ORAL_TABLET | Freq: Two times a day (BID) | ORAL | 0 refills | Status: AC | PRN

## 2019-03-19 MED FILL — predniSONE 10 MG TABS: 10 | 6 days supply | Qty: 21 | Fill #0

## 2019-03-19 MED FILL — CYCLOBENZAPRINE 10 MG TAB: 10 | 10 days supply | Qty: 30 | Fill #0

## 2019-03-19 MED FILL — traMADol HCL 50 MG TABS: 50 | 3 days supply | Qty: 6 | Fill #0

## 2019-03-19 NOTE — ED Triage Notes (Signed)
Pt presents with complaints of bilateral shoulder pain that is chronic. Reports over the last 5 days her neck started hurting and radiating into her left shoulder and arm. Denies any injury.

## 2019-03-19 NOTE — Discharge Instructions (Addendum)
Treating you for muscle spasming and nerve inflammation. Flexeril as needed for muscle spasm Steroid taper for pain, inflammation and swelling Tramadol for more severe pain. Follow-up with your primary care as scheduled

## 2019-03-21 ENCOUNTER — Encounter (HOSPITAL_COMMUNITY): Payer: Self-pay | Admitting: Family Medicine

## 2019-03-21 NOTE — ED Provider Notes (Signed)
[place]    CSN: [id number] Arrival date & time: 03/19/19  1318      History   Chief Complaint Chief Complaint  Patient presents with  . Shoulder Pain  . Neck Pain    HPI Samantha [name] is a 43 y.o. female.   Patient is a 43 year old female with past medical history of asthma, back pain, migraine, hidradenitis, hyperlipidemia, TIA. She presents today with chronic shoulder pain, neck pain and back pain.  Reporting worsening pain over the last 5 days.  Describing bilateral shoulder pain that is radiating into her left shoulder and arm.  This is chronic for her and she attributes this to her hidradenitis.  She takes doxycycline daily for this.  She has been taking Flexeril for muscle pain during this episode without much relief.  Mild numbness and tingling in the upper back area.  Denies any headache, dizziness or blurred vision.  Denies any injuries.  ROS per HPI      Past Medical History:  Diagnosis Date  . Asthma   . Back pain   . 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 Hydradenitis   . Hyperlipidemia   . TIA (transient ischemic attack) 04/2015   questionable, most likely complicated migraine    Patient Active Problem List   Diagnosis Date Noted  . GIB (gastrointestinal bleeding) 10/13/2018  . Amenorrhea 09/17/2017  . Adjustment disorder with mixed anxiety and depressed mood 06/26/2016  . Depressed mood 06/26/2016  . Back pain 01/31/2016  . 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 Acute pericarditis 12/26/2015  . Hidradenitis suppurativa 11/07/2015  . 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 Sinusitis 10/20/2015  . Complicated migraine 14/48/1856  . Marijuana use 10/20/2015  . Smoker 05/04/2015  . TIA (transient ischemic attack) 05/04/2015  . Palpitations 05/04/2015  . Decreased appetite 05/04/2015  . 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[place]) 04/22/2015  . Hyperlipidemia 04/22/2015  . TOA (tubo-ovarian abscess) 11/23/2012  . History of abnormal Pap smear 12/20/2011  . History  of PID 12/20/2011    Past Surgical History:  Procedure Laterality Date  . CERVICAL BIOPSY  W/ LOOP ELECTRODE EXCISION    . ESOPHAGOGASTRODUODENOSCOPY (EGD) WITH PROPOFOL N/A 10/14/2018   Procedure: ESOPHAGOGASTRODUODENOSCOPY (EGD) WITH PROPOFOL;  Surgeon: Wilford Corner, MD;  Location: [facility];  Service: Endoscopy;  Laterality: N/A;  . LEEP      OB History    Gravida  2   Para      Term      Preterm      AB  1   Living  1     SAB  1   TAB      Ectopic      Multiple      Live Births               Home Medications    Prior to Admission medications   Medication Sig Start Date End Date Taking? Authorizing Provider  albuterol (PROVENTIL HFA;VENTOLIN HFA) 108 (90 Base) MCG/ACT inhaler Inhale 2 puffs into the lungs every 4 (four) hours as needed for wheezing or shortness of breath (cough, shortness of breath or wheezing.). 11/20/18   Scot Jun, FNP  cyclobenzaprine (FLEXERIL) 10 MG tablet Take 1 tablet (10 mg total) by mouth 3 (three) times daily as needed for muscle spasms. 03/19/19   Loura Halt A, NP  doxycycline (VIBRA-TABS) 100 MG tablet Take 1 tablet (100 mg total) by mouth 2 (two) times daily. 11/10/18   Scot Jun, FNP  escitalopram Loma Sousa)  10 MG tablet Take 1 tablet (10 mg total) by mouth at bedtime. 11/10/18   Scot Jun, FNP  pantoprazole (PROTONIX) 40 MG tablet Take 1 tablet (40 mg total) by mouth 2 (two) times daily. 10/16/18   Reyne Dumas, MD  predniSONE (STERAPRED UNI-PAK 21 TAB) 10 MG (21) TBPK tablet 6 tabs for 1 day, then 5 tabs for 1 das, then 4 tabs for 1 day, then 3 tabs for 1 day, 2 tabs for 1 day, then 1 tab for 1 day 03/19/19   Loura Halt A, NP  propranolol (INDERAL) 10 MG tablet Take 1 tablet (10 mg total) by mouth 4 (four) times daily as needed. 11/10/18   Scot Jun, FNP  traMADol (ULTRAM) 50 MG tablet Take 1 tablet (50 mg total) by mouth every 12 (twelve) hours as needed for up to 3 days. 03/19/19 03/22/19  Orvan July, NP    Family History Family History  Problem Relation Age of Onset  . Hypertension Mother   . Lupus Mother   . Diabetes Father   . Thyroid disease Sister   . Diabetes Sister   . Cancer Sister        stomach  . Diabetes Maternal Grandmother   . Anesthesia problems Neg Hx   . Other Neg Hx   . Migraines Neg Hx     Social History Social History   Tobacco Use  . Smoking status: Current Every Day Smoker    Packs/day: 1.00    Years: 17.00    Pack years: 17.00    Types: Cigars  . Smokeless tobacco: Never Used  . Tobacco comment: 3 cigarettes a day   Substance Use Topics  . Alcohol use: Yes    Alcohol/week: 1.0 standard drinks    Types: 1 Shots of liquor per week    Comment: drink on the weekends  . Drug use: Yes    Frequency: 3.0 times per week    Types: Marijuana     Allergies   Patient has no known allergies.   Review of Systems Review of Systems   Physical Exam Triage Vital Signs ED Triage Vitals  Enc Vitals Group     BP 03/19/19 1438 122/82     Pulse Rate 03/19/19 1438 80     Resp 03/19/19 1438 19     Temp 03/19/19 1438 98.7 F (37.1 C)     Temp src --      SpO2 03/19/19 1438 100 %     Weight --      Height --      Head Circumference --      Peak Flow --      Pain Score 03/19/19 1436 8     Pain Loc --      Pain Edu? --      Excl. in [place]? --    No data found.  Updated Vital Signs BP 122/82   Pulse 80   Temp 98.7 F (37.1 C)   Resp 19   SpO2 100%   Visual Acuity Right Eye Distance:   Left Eye Distance:   Bilateral Distance:    Right Eye Near:   Left Eye Near:    Bilateral Near:     Physical Exam Vitals signs and nursing note reviewed.  HENT:     Head: Normocephalic and atraumatic.     Right Ear: Tympanic membrane and ear canal normal.     Left Ear: Tympanic membrane and ear canal normal.  Nose: Nose normal.  Eyes:     Conjunctiva/sclera: Conjunctivae normal.  Neck:     Musculoskeletal: Neck supple. No neck  rigidity.     Comments: Very limited flexion, extension of the neck.  Tenderness to cervical paravertebral musculature.  No bony tenderness.  No bruising, swelling or deformities. Cardiovascular:     Pulses: Normal pulses.  Pulmonary:     Effort: Pulmonary effort is normal.  Musculoskeletal:     Comments: Tenderness to bilateral trapezius muscles.  Skin:    General: Skin is warm and dry.     Findings: No rash.  Neurological:     Mental Status: She is alert.  Psychiatric:        Mood and Affect: Mood normal.      UC Treatments / Results  Labs (all labs ordered are listed, but only abnormal results are displayed) Labs Reviewed - No data to display  EKG   Radiology No results found.  Procedures Procedures (including critical care time)  Medications Ordered in UC Medications - No data to display  Initial Impression / Assessment and Plan / UC Course  I have reviewed the triage vital signs and the nursing notes.  Pertinent labs & imaging results that were available during my care of the patient were reviewed by me and considered in my medical decision making (see chart for details).     Treating for muscle spasming.  Most likely causing some nerve impingement and inflammation. Treating with prednisone taper, Flexeril and tramadol for more severe pain Instructed to follow-up for any continued or worsening symptoms. Final Clinical Impressions(s) / UC Diagnoses   Final diagnoses:  Neck pain     Discharge Instructions     Treating you for muscle spasming and nerve inflammation. Flexeril as needed for muscle spasm Steroid taper for pain, inflammation and swelling Tramadol for more severe pain. Follow-up with your primary care as scheduled   ED Prescriptions    Medication Sig Dispense Auth. Provider   cyclobenzaprine (FLEXERIL) 10 MG tablet Take 1 tablet (10 mg total) by mouth 3 (three) times daily as needed for muscle spasms. 30 tablet Leni Pankonin A, NP    predniSONE (STERAPRED UNI-PAK 21 TAB) 10 MG (21) TBPK tablet 6 tabs for 1 day, then 5 tabs for 1 das, then 4 tabs for 1 day, then 3 tabs for 1 day, 2 tabs for 1 day, then 1 tab for 1 day 21 tablet Onya Eutsler A, NP   traMADol (ULTRAM) 50 MG tablet Take 1 tablet (50 mg total) by mouth every 12 (twelve) hours as needed for up to 3 days. 6 tablet Orvan July, NP     Controlled Substance Prescriptions [place] Controlled Substance Registry consulted? no   Orvan July, NP 03/21/19 [phone]

## 2019-03-25 ENCOUNTER — Other Ambulatory Visit: Payer: Self-pay

## 2019-03-25 ENCOUNTER — Telehealth: Payer: Self-pay | Admitting: Family Medicine

## 2019-03-25 ENCOUNTER — Ambulatory Visit (INDEPENDENT_AMBULATORY_CARE_PROVIDER_SITE_OTHER): Payer: Self-pay | Admitting: Family Medicine

## 2019-03-25 DIAGNOSIS — M25511 Pain in right shoulder: Secondary | ICD-10-CM

## 2019-03-25 DIAGNOSIS — M25512 Pain in left shoulder: Secondary | ICD-10-CM

## 2019-03-25 DIAGNOSIS — M5412 Radiculopathy, cervical region: Secondary | ICD-10-CM

## 2019-03-25 DIAGNOSIS — G8929 Other chronic pain: Secondary | ICD-10-CM

## 2019-03-25 MED ORDER — GABAPENTIN 300 MG PO CAPS: 300.0000 mg | ORAL_CAPSULE | Freq: Three times a day (TID) | ORAL | 3 refills | Status: DC | PRN

## 2019-03-25 MED FILL — GABAPENTIN 300 MG CAPSULE: 300 | 30 days supply | Qty: 90 | Fill #0

## 2019-03-25 NOTE — Telephone Encounter (Signed)
Reviewed documentation from disability.  This information is nothing that needs to be completed by PCP.  The information needs to be completed by the patient authorizing release of her disability evaluation notes to be released to her PCP.  Paperwork given back to CMA to instruct patient how to complete and sign paperwork authorizing release of disability evaluation to PCP office.

## 2019-03-25 NOTE — Progress Notes (Deleted)
Worked up patient for their telephone visit with provider Molli Barrows, FNP-C. Verified date of birth. Patient states that her neck/back/shoulder pain was getting a little better while on the Prednisone. Took last dose yesterday & the pain started coming back last night. Shoulder pain is bilateral but she is starting to have pain go down her left arm. KWalker, CMA.

## 2019-03-25 NOTE — Progress Notes (Signed)
Virtual Visit via Telephone Note  I connected with Samantha [name] on 03/25/19 at  3:10 PM EDT by telephone and verified that I am speaking with the correct person using two identifiers.  Location: Patient: Located at home during today's encounter  Provider: Located at primary care office      I discussed the limitations, risks, security and privacy concerns of performing an evaluation and management service by telephone and the availability of in person appointments. I also discussed with the patient that there may be a patient responsible charge related to this service. The patient expressed understanding and agreed to proceed.  History of Present Illness: Samantha [name] is present during today's telemedicine encounter for evaluation of chronic shoulder pain consistent with a diagnosis of cervical radiculopathy.  This is been an ongoing problem for stabilizes and exacerbates regardless of activity.  Patient has been taking ibuprofen without relief of pain.  She was recently seen in the emergency department for this problem and placed on a prednisone taper which she reports relieves symptoms while she was taking the medication but assumes medications stopped there was no relief.  She was also prescribed tramadol which also provided minimal relief.  She describes the pain as a burning sensation which radiates from her neck to her shoulder and stops above her elbow.  She denies any numbness or tingling in her hands or in her fingers.  She denies weakness of hand grips.  To her knowledge she has suffered no apparent injury.  She is uninsured and currently has a disability claim pending therefore has not previously been referred to an orthopedic specialist.  Pain is causing inability to rest comfortably at night and sleep.  At present the pain is more intense at the left shoulder. Assessment and Plan: 1. Cervical radiculopathy - Ambulatory referral to Orthopedic Surgery  2. Chronic left shoulder pain -  Ambulatory referral to Orthopedic Surgery  3. Chronic right shoulder pain - Ambulatory referral to Orthopedic Surgery  No recent imaging on file however given patient's chronic complaint of neck shoulder and back pain will refer to Ortho for further evaluation of source of neck pain.  Quality of pain sound suspicious for that of neuropathic pain therefore will trial gabapentin 30 mg TID for pain relief patient advised that medication can cause extreme drowsiness therefore avoid driving while taking medication.  She verbalized understanding.  Referral placed for Dr. Lorin Mercy at [facility].  Meds ordered this encounter  Medications  . gabapentin (NEURONTIN) 300 MG capsule    Sig: Take 1 capsule (300 mg total) by mouth 3 (three) times daily as needed.    Dispense:  90 capsule    Refill:  3   Follow Up Instructions: Patient is to schedule follow-up for anxiety and depression at her convenience.  This has not been evaluated in office since March.   I discussed the assessment and treatment plan with the patient. The patient was provided an opportunity to ask questions and all were answered. The patient agreed with the plan and demonstrated an understanding of the instructions.   The patient was advised to call back or seek an in-person evaluation if the symptoms worsen or if the condition fails to improve as anticipated.  I provided 20 minutes of non-face-to-face time during this encounter.   Molli Barrows, FNP

## 2019-03-25 NOTE — Telephone Encounter (Signed)
Patient came in and dropped paperwork off disability

## 2019-04-20 MED FILL — DOXYCYCLINE HYCLATE 100 MG: 100 | 30 days supply | Qty: 60 | Fill #3

## 2019-05-29 MED FILL — DOXYCYCLINE HYCLATE 100 MG: 100 | 30 days supply | Qty: 60 | Fill #4

## 2019-07-03 MED FILL — DOXYCYCLINE HYCLATE 100 MG: 100 | 30 days supply | Qty: 60 | Fill #5

## 2019-08-05 ENCOUNTER — Telehealth: Payer: Self-pay | Admitting: Family Medicine

## 2019-08-05 ENCOUNTER — Other Ambulatory Visit: Payer: Self-pay | Admitting: Family Medicine

## 2019-08-05 ENCOUNTER — Telehealth: Payer: Self-pay

## 2019-08-05 DIAGNOSIS — L732 Hidradenitis suppurativa: Secondary | ICD-10-CM

## 2019-08-05 MED ORDER — DOXYCYCLINE HYCLATE 100 MG PO TABS: 100.0000 mg | ORAL_TABLET | Freq: Two times a day (BID) | ORAL | 0 refills | Status: DC

## 2019-08-05 MED FILL — GABAPENTIN 300 MG CAPSULE: 300 | 30 days supply | Qty: 90 | Fill #1

## 2019-08-05 MED FILL — DOXYCYCLINE HYCLATE 100 MG: 100 | 30 days supply | Qty: 60 | Fill #0

## 2019-08-05 NOTE — Telephone Encounter (Signed)
1) Medication(s) Requested (by name): -doxycycline (VIBRA-TABS) 100 MG tablet   2) Pharmacy of Choice: -[facility], [address]  3) Special Requests: -at least until her follow up on 08/10/2019

## 2019-08-05 NOTE — Telephone Encounter (Signed)
I am not in office and have never seen patient. Please forward to provider at [facility] today or forward to Dr. Margarita Rana. Thank you

## 2019-08-05 NOTE — Telephone Encounter (Signed)

## 2019-08-10 ENCOUNTER — Other Ambulatory Visit: Payer: Self-pay

## 2019-08-10 ENCOUNTER — Ambulatory Visit (INDEPENDENT_AMBULATORY_CARE_PROVIDER_SITE_OTHER): Payer: Self-pay | Admitting: Family Medicine

## 2019-08-10 VITALS — BP 128/74 | HR 93 | Temp 97.3°F | Resp 17 | Wt 152.0 lb

## 2019-08-10 DIAGNOSIS — M5412 Radiculopathy, cervical region: Secondary | ICD-10-CM

## 2019-08-10 DIAGNOSIS — R200 Anesthesia of skin: Secondary | ICD-10-CM

## 2019-08-10 DIAGNOSIS — R7303 Prediabetes: Secondary | ICD-10-CM

## 2019-08-10 DIAGNOSIS — G8929 Other chronic pain: Secondary | ICD-10-CM

## 2019-08-10 DIAGNOSIS — L732 Hidradenitis suppurativa: Secondary | ICD-10-CM

## 2019-08-10 DIAGNOSIS — M62830 Muscle spasm of back: Secondary | ICD-10-CM

## 2019-08-10 DIAGNOSIS — M25512 Pain in left shoulder: Secondary | ICD-10-CM

## 2019-08-10 DIAGNOSIS — R202 Paresthesia of skin: Secondary | ICD-10-CM

## 2019-08-10 DIAGNOSIS — F331 Major depressive disorder, recurrent, moderate: Secondary | ICD-10-CM

## 2019-08-10 MED ORDER — TRAMADOL HCL 50 MG PO TABS: 50.0000 mg | ORAL_TABLET | Freq: Four times a day (QID) | ORAL | 0 refills | Status: AC | PRN

## 2019-08-10 MED ORDER — CLINDAMYCIN PHOSPHATE 1 % EX GEL: Freq: Two times a day (BID) | CUTANEOUS | 3 refills | Status: DC

## 2019-08-10 MED ORDER — METFORMIN HCL 500 MG PO TABS: 500.0000 mg | ORAL_TABLET | Freq: Two times a day (BID) | ORAL | 3 refills | Status: DC

## 2019-08-10 MED ORDER — GABAPENTIN 300 MG PO CAPS: 300.0000 mg | ORAL_CAPSULE | Freq: Three times a day (TID) | ORAL | 3 refills | Status: DC | PRN

## 2019-08-10 MED ORDER — ESCITALOPRAM OXALATE 10 MG PO TABS: 10.0000 mg | ORAL_TABLET | Freq: Every day | ORAL | 0 refills | Status: DC

## 2019-08-10 MED ORDER — CYCLOBENZAPRINE HCL 10 MG PO TABS: 10.0000 mg | ORAL_TABLET | Freq: Three times a day (TID) | ORAL | 0 refills | Status: DC | PRN

## 2019-08-10 MED ORDER — DOXYCYCLINE HYCLATE 100 MG PO TABS: 100.0000 mg | ORAL_TABLET | Freq: Two times a day (BID) | ORAL | 0 refills | Status: DC

## 2019-08-10 MED FILL — CLINDAMYCIN PH 1% GEL: 1 | 60 days supply | Qty: 60 | Fill #0

## 2019-08-10 MED FILL — ESCITALOPRAM 10 MG TABLET: 10 | 30 days supply | Qty: 30 | Fill #0

## 2019-08-10 MED FILL — CYCLOBENZAPRINE 10 MG TAB: 10 | 10 days supply | Qty: 30 | Fill #0

## 2019-08-10 MED FILL — traMADol HCL 50 MG TABS: 50 | 5 days supply | Qty: 20 | Fill #0

## 2019-08-10 MED FILL — metFORMIN HCL 500 MG TABS: 500 | 30 days supply | Qty: 60 | Fill #0

## 2019-08-10 NOTE — Patient Instructions (Signed)

## 2019-08-10 NOTE — Progress Notes (Signed)
Patient following up on L shoulder pain. No known injury. Describes pain as sharp stabbing pain. Radiates down her L arm. Rates pain as 10/10. Also causing numbness/tingling in the L arm. Is only taking the Gabapentin at night because it makes her sleepy.  Needs refills on the Flexeril.  Elevated PHQ9/GAD7.

## 2019-08-10 NOTE — Progress Notes (Signed)
Established Patient Office Visit  Subjective:  Patient ID: Samantha [name], female    DOB: 1976-01-06  Age: 43 y.o. MRN: [id number]  CC:  Chief Complaint  Patient presents with  . Shoulder Pain    HPI [name], presents in follow-up of telemedicine visit on 03/25/2019 with her primary care provider, Molli Barrows, NP for which she was seen for chronic neck and left shoulder pain.  At today's visit, she reports that she continues to have daily lower and upper back pain, left shoulder pain, neck pain, neck stiffness and occasionally feels radiation of pain to her left hand.  She has also noticed episodes of numbness and tingling in her hands bilaterally left greater than right.  She reports that her neck/shoulder and back pain are never less than a 6 on a 0-to-10 scale and usually around 8 and increased pain with movement or attempting to hold any objects in her left hand.  Pain is sharp and occasionally stinging in nature and has been occurring for more than 6 months.  She has difficulty finding a comfortable position to sleep secondary to her pain.  She reports that she is currently unemployed and has had difficulty getting follow-up of her chronic pain issues.         She reports that additionally she has a skin condition which causes recurrent boils usually under her armpits.  She reports that she feels as if she is getting recurrence of these boils.  She states that having the boils occur recurrently also hinders her efforts to find work in addition to her chronic pain and fatigue due to her chronic pain and difficulty sleeping.         She feels that she is currently depressed and anxious due to financial worries from lack of employment as well as due to feeling overwhelmed due to her chronic pain.  She denies active suicidal thoughts but sometimes feels as if it would be better if she just did not wake up 1 day.  She states that she knows that these thoughts are related to her having chronic pain  and fatigue.  She has not formulated any suicidal plan.  She also has a history of migraines and she believes that she tends to get headaches more often recently due to her ongoing pain and fatigue.  She denies any issues with chest pain or palpitations, she denies cough but occasionally feels as if she is acutely short of breath and has difficulty taking in a deep breath.  She denies any abdominal pain currently no nausea/vomiting/diarrhea or constipation.  No recent fever or chills but occasionally feels as if she will have a low-grade fever when she is having flareups of skin infections.  Past Medical History:  Diagnosis Date  . Asthma   . Back pain   . 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 Hydradenitis   . Hyperlipidemia   . TIA (transient ischemic attack) 04/2015   questionable, most likely complicated migraine    Past Surgical History:  Procedure Laterality Date  . CERVICAL BIOPSY  W/ LOOP ELECTRODE EXCISION    . ESOPHAGOGASTRODUODENOSCOPY (EGD) WITH PROPOFOL N/A 10/14/2018   Procedure: ESOPHAGOGASTRODUODENOSCOPY (EGD) WITH PROPOFOL;  Surgeon: Wilford Corner, MD;  Location: [facility];  Service: Endoscopy;  Laterality: N/A;  . LEEP      Family History  Problem Relation Age of Onset  . Hypertension Mother   . Lupus Mother   . Diabetes Father   . Thyroid disease Sister   .  Diabetes Sister   . Cancer Sister        stomach  . Diabetes Maternal Grandmother   . Anesthesia problems Neg Hx   . Other Neg Hx   . Migraines Neg Hx     Social History   Socioeconomic History  . Marital status: Single    Spouse name: Not on file  . Number of children: 1  . Years of education: 58  . Highest education level: Not on file  Occupational History  . Not on file  Social Needs  . Financial resource strain: Not on file  . Food insecurity    Worry: Not on file    Inability: Not on file  . Transportation needs    Medical: Not on file    Non-medical: Not on file  Tobacco Use  . Smoking  status: Current Every Day Smoker    Packs/day: 1.00    Years: 17.00    Pack years: 17.00    Types: Cigars  . Smokeless tobacco: Never Used  . Tobacco comment: 3 cigarettes a day   Substance and Sexual Activity  . Alcohol use: Yes    Alcohol/week: 1.0 standard drinks    Types: 1 Shots of liquor per week    Comment: drink on the weekends  . Drug use: Yes    Frequency: 3.0 times per week    Types: Marijuana  . Sexual activity: Not Currently    Birth control/protection: None  Lifestyle  . Physical activity    Days per week: 7 days    Minutes per session: 30 min  . Stress: Rather much  Relationships  . Social Herbalist on phone: Three times a week    Gets together: More than three times a week    Attends religious service: 1 to 4 times per year    Active member of club or organization: No    Attends meetings of clubs or organizations: Never    Relationship status: Never married  . Intimate partner violence    Fear of current or ex partner: No    Emotionally abused: No    Physically abused: No    Forced sexual activity: No  Other Topics Concern  . Not on file  Social History Narrative   Patient drinks sodas all day long.   Patient is right handed.     Outpatient Medications Prior to Visit  Medication Sig Dispense Refill  . albuterol (PROVENTIL HFA;VENTOLIN HFA) 108 (90 Base) MCG/ACT inhaler Inhale 2 puffs into the lungs every 4 (four) hours as needed for wheezing or shortness of breath (cough, shortness of breath or wheezing.). 1 Inhaler 1  . cyclobenzaprine (FLEXERIL) 10 MG tablet Take 1 tablet (10 mg total) by mouth 3 (three) times daily as needed for muscle spasms. 30 tablet 0  . doxycycline (VIBRA-TABS) 100 MG tablet Take 1 tablet (100 mg total) by mouth 2 (two) times daily. 60 tablet 0  . escitalopram (LEXAPRO) 10 MG tablet Take 1 tablet (10 mg total) by mouth at bedtime. 60 tablet 0  . gabapentin (NEURONTIN) 300 MG capsule Take 1 capsule (300 mg total) by  mouth 3 (three) times daily as needed. 90 capsule 3  . pantoprazole (PROTONIX) 40 MG tablet Take 1 tablet (40 mg total) by mouth 2 (two) times daily. 60 tablet 3  . propranolol (INDERAL) 10 MG tablet Take 1 tablet (10 mg total) by mouth 4 (four) times daily as needed. 60 tablet 1   No facility-administered  medications prior to visit.     No Known Allergies  ROS Review of Systems  Constitutional: Positive for fatigue. Negative for chills and fever.  HENT: Negative for sore throat and trouble swallowing.   Eyes: Negative for photophobia and visual disturbance.  Respiratory: Positive for shortness of breath (occasionally feels as if she cannot take in a deep breath). Negative for cough.   Cardiovascular: Negative for chest pain, palpitations and leg swelling.  Gastrointestinal: Negative for abdominal pain, diarrhea, nausea and vomiting.  Endocrine: Negative for polydipsia, polyphagia and polyuria.  Genitourinary: Negative for dysuria, flank pain and frequency.  Musculoskeletal: Positive for arthralgias, back pain, neck pain and neck stiffness.  Neurological: Positive for numbness (bilateral fingertips left greater than right) and headaches (occasional; back of neck). Negative for dizziness.  Hematological: Negative for adenopathy. Does not bruise/bleed easily.  Psychiatric/Behavioral: Positive for decreased concentration, dysphoric mood and sleep disturbance. Negative for self-injury and suicidal ideas. The patient is nervous/anxious.       Objective:    Physical Exam  Constitutional: She is oriented to person, place, and time. She appears well-developed and well-nourished. She appears distressed.  Patient was tearful throughout office visit; WNWD female wearing mask as per office COVID protocol  Neck: No JVD present. Thyromegaly (mild symmetric borderline enlarged thyroid) present.  Moderate posterior cervical paraspinus and upper back spasm  Cardiovascular: Normal rate and regular  rhythm.  Pulmonary/Chest: Effort normal and breath sounds normal.  Abdominal: Soft. There is no abdominal tenderness. There is no rebound and no guarding.  Musculoskeletal:        General: Tenderness present.  Lymphadenopathy:    She has no cervical adenopathy.  Neurological: She is alert and oriented to person, place, and time.  Skin:  Patient with scarring in the bilateral axillae from prior boils and a few small slightly tender nodules in each axilla  Psychiatric:  Patient is tearful throughout encounter- continues to wipe away tears, not sobbing but has flattened affect but interacts appropriately otherwise  Nursing note and vitals reviewed.   BP 128/74   Pulse 93   Temp (!) 97.3 F (36.3 C) (Temporal)   Resp 17   Wt 152 lb (68.9 kg)   SpO2 95%   BMI 26.09 kg/m  Wt Readings from Last 3 Encounters:  08/10/19 152 lb (68.9 kg)  11/10/18 151 lb (68.5 kg)  10/14/18 145 lb 1 oz (65.8 kg)     Health Maintenance Due  Topic Date Due  . INFLUENZA VACCINE  04/11/2019    Lab Results  Component Value Date   TSH 1.080 11/10/2018   Lab Results  Component Value Date   WBC 7.1 11/10/2018   HGB 13.6 11/10/2018   HCT 40.5 11/10/2018   MCV 91 11/10/2018   PLT 287 11/10/2018   Lab Results  Component Value Date   NA 137 11/10/2018   K 4.5 11/10/2018   CO2 23 11/10/2018   GLUCOSE 87 11/10/2018   BUN 11 11/10/2018   CREATININE 0.77 11/10/2018   BILITOT 0.3 11/10/2018   ALKPHOS 72 11/10/2018   AST 15 11/10/2018   ALT 6 11/10/2018   PROT 7.0 11/10/2018   ALBUMIN 4.4 11/10/2018   CALCIUM 9.2 11/10/2018   ANIONGAP 6 10/14/2018   Lab Results  Component Value Date   CHOL 187 10/20/2015   Lab Results  Component Value Date   HDL 55 10/20/2015   Lab Results  Component Value Date   LDLCALC 121 (H) 10/20/2015   Lab Results  Component Value Date   TRIG 53 10/20/2015   Lab Results  Component Value Date   CHOLHDL 3.4 10/20/2015   Lab Results  Component Value Date    HGBA1C 5.7 (H) 11/10/2018      Assessment & Plan:  1. Chronic left shoulder pain Based on exam, she has some component of rotator cuff tendinopathy in addition to possible cervical radicular symptoms. Rx for tramadol and refill of flexeril as well as Orthopedic referral placed.   - cyclobenzaprine (FLEXERIL) 10 MG tablet; Take 1 tablet (10 mg total) by mouth 3 (three) times daily as needed for muscle spasms.  Dispense: 30 tablet; Refill: 0 - AMB referral to orthopedics  2. Cervical radiculopathy Prescriptions provided for Flexeril for muscle spasm and gabapentin for neuropathic pain.  Discussed with patient that she likely has cervical radiculopathy in addition to rotator cuff issues in the left shoulder.  She has been referred to orthopedics for further evaluation and treatment. - cyclobenzaprine (FLEXERIL) 10 MG tablet; Take 1 tablet (10 mg total) by mouth 3 (three) times daily as needed for muscle spasms.  Dispense: 30 tablet; Refill: 0 - gabapentin (NEURONTIN) 300 MG capsule; Take 1 capsule (300 mg total) by mouth 3 (three) times daily as needed.  Dispense: 90 capsule; Refill: 3 - AMB referral to orthopedics  3. Hidradenitis suppurativa Prescription provided for doxycycline 100 mg twice daily and Clindagel to apply topically twice daily to the affected areas for treatment of hidradenitis suppurativa - doxycycline (VIBRA-TABS) 100 MG tablet; Take 1 tablet (100 mg total) by mouth 2 (two) times daily.  Dispense: 20 tablet; Refill: 0 - clindamycin (CLINDAGEL) 1 % gel; Apply topically 2 (two) times daily. To affected skin areas where boils occur  Dispense: 30 g; Refill: 3  4. Moderate recurrent major depression ([place]) Patient agrees to be referred to Education officer, museum for help establishing ongoing mental health follow-up of her major depressive disorder.  She denies any suicidal thoughts or ideations at this time.  Refill provided of Lexapro 10 mg daily.  Discussed with patient that social work  can also help patient with further information regarding applying for financial assistance program available through home health. - escitalopram (LEXAPRO) 10 MG tablet; Take 1 tablet (10 mg total) by mouth at bedtime.  Dispense: 60 tablet; Refill: 0 - Ambulatory referral to Social Work  5. Muscle spasm of back Prescription for Flexeril 10 mg which she may take up to 3 times daily as needed for muscle spasm.  She is aware that the medication can also cause drowsiness so she may wish to take the medication at bedtime and also use warm moist heat to the affected areas to help with muscle spasm. - cyclobenzaprine (FLEXERIL) 10 MG tablet; Take 1 tablet (10 mg total) by mouth 3 (three) times daily as needed for muscle spasms.  Dispense: 30 tablet; Refill: 0  6. Prediabetes Patient with complaint of some numbness and tingling in her fingertips.  She did not have exam findings consistent with carpal tunnel syndrome but does have some cervical radicular symptoms which may be contributing.  She did have hemoglobin A1c done earlier this year in February which was abnormal at 5.7 consistent with prediabetes.  She will have repeat hemoglobin A1c at today's visit as well as basic metabolic panel.  She agrees to start the use of metformin which I discussed with the patient may also help with her hidradenitis.  Low carbohydrate diet and avoidance of concentrated sweets as well as exercise such as  walking also discussed to help with insulin resistance/prediabetes. - Hemoglobin [id number] - Basic Metabolic Panel - metFORMIN (GLUCOPHAGE) 500 MG tablet; Take 1 tablet (500 mg total) by mouth 2 (two) times daily with a meal.  Dispense: 180 tablet; Refill: 3  7. Numbness and tingling in both hands Discussed with the patient that her symptoms may be related to her cervical radiculopathy and patient also with prediabetes.  Will repeat hemoglobin A1c and BMP at today's visit in follow-up of her numbness and tingling in her hands.   She is also encouraged to try an over-the-counter vitamin B12 supplement.  An After Visit Summary was printed and given to the patient.  Follow-up: Return in about 2 weeks (around 08/24/2019) for pain/skin infection.   Antony Blackbird, MD

## 2019-08-11 ENCOUNTER — Ambulatory Visit (INDEPENDENT_AMBULATORY_CARE_PROVIDER_SITE_OTHER): Payer: Self-pay | Admitting: Licensed Clinical Social Worker

## 2019-08-11 ENCOUNTER — Other Ambulatory Visit: Payer: Self-pay

## 2019-08-11 DIAGNOSIS — F332 Major depressive disorder, recurrent severe without psychotic features: Secondary | ICD-10-CM

## 2019-08-11 LAB — BASIC METABOLIC PANEL WITH GFR
BUN/Creatinine Ratio: 12 (ref 9–23)
BUN: 10 mg/dL (ref 6–24)
CO2: 19 mmol/L — ABNORMAL LOW (ref 20–29)
Calcium: 9.8 mg/dL (ref 8.7–10.2)
Chloride: 105 mmol/L (ref 96–106)
Creatinine, Ser: 0.86 mg/dL (ref 0.57–1.00)
GFR calc Af Amer: 96 mL/min/1.73
GFR calc non Af Amer: 84 mL/min/1.73
Glucose: 86 mg/dL (ref 65–99)
Potassium: 4.4 mmol/L (ref 3.5–5.2)
Sodium: 138 mmol/L (ref 134–144)

## 2019-08-11 LAB — HEMOGLOBIN A1C
Est. average glucose Bld gHb Est-mCnc: 114 mg/dL
Hgb A1c MFr Bld: 5.6 % (ref 4.8–5.6)

## 2019-08-11 NOTE — BH Specialist Note (Signed)
Integrated Behavioral Health Visit via Telemedicine (Telephone)  08/11/2019 Samantha [name] [id number]   Session Start time: 2:40 PM  Session End time: 3:10 PM Total time: 30  Referring Provider: Dr. Chapman Fitch Type of Visit: Telephonic Patient location: Home [facility] Provider location: Office All persons participating in visit: Pt and LCSW  Confirmed patient's address: Yes  Confirmed patient's phone number: Yes  Any changes to demographics: No   Confirmed patient's insurance: Yes  Any changes to patient's insurance: No   Discussed confidentiality: Yes    The following statements were read to the patient and/or legal guardian that are established with the [facility] Provider.  "The purpose of this phone visit is to provide behavioral health care while limiting exposure to the coronavirus (COVID19).  There is a possibility of technology failure and discussed alternative modes of communication if that failure occurs."  "By engaging in this telephone visit, you consent to the provision of healthcare.  Additionally, you authorize for your insurance to be billed for the services provided during this telephone visit."   Patient and/or legal guardian consented to telephone visit: Yes   PRESENTING CONCERNS: Patient and/or family reports the following symptoms/concerns: Pt reports increase in anxiety and depression symptoms triggered by chronic pain. She reports episodes of sadness, irritability, and difficulty sleeping. It has been hard to manage medical conditions due to being uninsured Duration of problem: Ongoing; Severity of problem: severe  STRENGTHS (Protective Factors/Coping Skills): Pt receives support from family Pt is open to services (medication management and therapy)  GOALS ADDRESSED: Patient will: 1.  Reduce symptoms of: agitation, anxiety and depression  2.  Increase knowledge and/or ability of: coping skills and healthy habits  3.  Demonstrate ability to: Increase healthy  adjustment to current life circumstances and Increase adequate support systems for patient/family  INTERVENTIONS: Interventions utilized:  Solution-Focused Strategies, Supportive Counseling and Psychoeducation and/or Health Education Standardized Assessments completed: Not Needed  ASSESSMENT: Patient currently experiencing an increase in anxiety and depression symptoms triggered by chronic pain. She reports episodes of sadness, irritability, and difficulty sleeping. It has been hard to manage medical conditions due to being uninsured. Pt endorses passive suicidal ideations without plan or intent. Protective factors were identified and crisis intervention resources were provided.  Patient may benefit from therapy. She is participating in medication management through PCP noting that prescribed medication will be picked up tomorrow. Healthy coping skills were discussed (deep breathing, hot baths, puppy Louis) Pt has an upcoming financial counseling appt scheduled for 08/17/19 to assist with medical coverage  PLAN: 1. Follow up with behavioral health clinician on : 09/01/2019 2. Behavioral recommendations: Continue medication management, follow up with financial counseling appt, and utilize healthy coping skills discussed. Utilize crisis intervention resources, if needed 3. Referral(s): [place] (In Clinic)  Rebekah Chesterfield, [place] 08/11/2019 4:39 PM

## 2019-08-17 ENCOUNTER — Ambulatory Visit: Payer: No Typology Code available for payment source

## 2019-09-01 ENCOUNTER — Ambulatory Visit (INDEPENDENT_AMBULATORY_CARE_PROVIDER_SITE_OTHER): Payer: Self-pay | Admitting: Licensed Clinical Social Worker

## 2019-09-01 ENCOUNTER — Other Ambulatory Visit: Payer: Self-pay

## 2019-09-01 DIAGNOSIS — F332 Major depressive disorder, recurrent severe without psychotic features: Secondary | ICD-10-CM

## 2019-09-07 ENCOUNTER — Ambulatory Visit: Payer: No Typology Code available for payment source

## 2019-09-09 ENCOUNTER — Ambulatory Visit: Payer: No Typology Code available for payment source

## 2019-09-10 ENCOUNTER — Other Ambulatory Visit: Payer: Self-pay | Admitting: Family Medicine

## 2019-09-10 DIAGNOSIS — N926 Irregular menstruation, unspecified: Secondary | ICD-10-CM

## 2019-09-10 NOTE — BH Specialist Note (Signed)
Integrated Behavioral Health Visit via Telemedicine (Telephone)  09/01/2019 Milania Hamric [id number]   Session Start time: 1:35 PM  Session End time: 2:00 PM Total time: 25  Referring Provider: Dr. Chapman Fitch Type of Visit: Telephonic Patient location: Home [facility] Provider location: Office All persons participating in visit: Patient and LCSW  Confirmed patient's address: Yes  Confirmed patient's phone number: Yes  Any changes to demographics: No   Confirmed patient's insurance: Yes  Any changes to patient's insurance: No   Discussed confidentiality: Yes    The following statements were read to the patient and/or legal guardian that are established with the [facility] Provider.  "The purpose of this phone visit is to provide behavioral health care while limiting exposure to the coronavirus (COVID19).  There is a possibility of technology failure and discussed alternative modes of communication if that failure occurs."  "By engaging in this telephone visit, you consent to the provision of healthcare.  Additionally, you authorize for your insurance to be billed for the services provided during this telephone visit."   Patient and/or legal guardian consented to telephone visit: Yes   PRESENTING CONCERNS: Patient and/or family reports the following symptoms/concerns: Pt reports continued difficulty managing mental health conditions. Reports minimal change in symptoms; however, notes decrease in passive SI. Pt has been on medication (Lexapro) for one week and has been utilizing healthy coping skills.  Duration of problem: Ongoing; Severity of problem: severe  STRENGTHS (Protective Factors/Coping Skills): Pt is participating in medication management Pt receives support from family  GOALS ADDRESSED: Patient will: 1.  Reduce symptoms of: agitation, anxiety and depression  2.  Increase knowledge and/or ability of: self-management skills  3.  Demonstrate ability to: Increase healthy  adjustment to current life circumstances and Improve medication compliance  INTERVENTIONS: Interventions utilized:  Solution-Focused Strategies and Supportive Counseling Standardized Assessments completed: Not Needed  ASSESSMENT: Patient currently experiencing minimal changes in depression and anxiety symptoms. Pt has been taking medication for approximately one week and reports a decrease in passive SI.   Patient may benefit from continued medication management, brief psychotherapy, and utilization of healthy coping skills. Pt shared that she has discontinued taking metformin due to concerns about unwanted weight loss. She also complaining of prolonged menstrual cycle. PCP will be notified of concerns. Therapeutic strategies discussed to assist in management and/or decrease of symptoms.     PLAN: 1. Follow up with behavioral health clinician on : LCSW will follow up with patient in two weeks to re-assess symptoms. Pt has an upcoming appointment with PCP and is awaiting a call back regarding available appointments for Financial Counselor 2. Behavioral recommendations: Continue utilizing healthy coping skills and compliance with medication management 3. Referral(s): [place] (In Clinic)  Rebekah Chesterfield, [place] 09/10/19 12:24 PM

## 2019-09-10 NOTE — Progress Notes (Signed)
Patient ID: Samantha [name], female   DOB: 02-Mar-1976, 43 y.o.   MRN: [id number]   Message received from MSW that patient is requesting a referral to GYN due to prolonged menses for the past 15 days and patient has also stopped the use of metformin due to weight loss. Patient has follow-up appointment on 09/21/2019. GYN referral will be placed and other issues will be discussed at follow-up appointment.

## 2019-09-14 ENCOUNTER — Other Ambulatory Visit: Payer: Self-pay | Admitting: Family Medicine

## 2019-09-14 DIAGNOSIS — M5412 Radiculopathy, cervical region: Secondary | ICD-10-CM

## 2019-09-14 DIAGNOSIS — M62830 Muscle spasm of back: Secondary | ICD-10-CM

## 2019-09-14 DIAGNOSIS — L732 Hidradenitis suppurativa: Secondary | ICD-10-CM

## 2019-09-14 DIAGNOSIS — G8929 Other chronic pain: Secondary | ICD-10-CM

## 2019-09-14 NOTE — Telephone Encounter (Signed)
Please advise.  Unsure from last note if patient is still supposed to be on these medications.

## 2019-09-14 NOTE — Telephone Encounter (Signed)
1) Medication(s) Requested (by name): doxycycline (VIBRA-TABS) 100 MG tablet [id number]  cyclobenzaprine (FLEXERIL) 10 MG tablet [id number]   2) Pharmacy of Choice: [facility], [address]  [place], [place] [address]     Approved medications will be sent to pharmacy, we will reach out to you if there is an issue.  Requests made after 3pm may not be addressed until following business day!

## 2019-09-16 ENCOUNTER — Other Ambulatory Visit: Payer: Self-pay | Admitting: Family Medicine

## 2019-09-16 DIAGNOSIS — M5412 Radiculopathy, cervical region: Secondary | ICD-10-CM

## 2019-09-16 DIAGNOSIS — G8929 Other chronic pain: Secondary | ICD-10-CM

## 2019-09-16 DIAGNOSIS — M62830 Muscle spasm of back: Secondary | ICD-10-CM

## 2019-09-16 MED FILL — DOXYCYCLINE HYCLATE 100 MG: 100 | 10 days supply | Qty: 20 | Fill #0

## 2019-09-18 MED ORDER — CYCLOBENZAPRINE HCL 10 MG PO TABS: 10.0000 mg | ORAL_TABLET | Freq: Three times a day (TID) | ORAL | 0 refills | Status: DC | PRN

## 2019-09-18 MED ORDER — DOXYCYCLINE HYCLATE 100 MG PO TABS: 100.0000 mg | ORAL_TABLET | Freq: Two times a day (BID) | ORAL | 0 refills | Status: DC

## 2019-09-18 MED FILL — CYCLOBENZAPRINE 10 MG TAB: 10 | 10 days supply | Qty: 30 | Fill #0

## 2019-09-20 ENCOUNTER — Encounter: Payer: Self-pay | Admitting: Family Medicine

## 2019-09-21 ENCOUNTER — Encounter: Payer: Self-pay | Admitting: Family Medicine

## 2019-09-21 ENCOUNTER — Ambulatory Visit (INDEPENDENT_AMBULATORY_CARE_PROVIDER_SITE_OTHER): Payer: Self-pay | Admitting: Family Medicine

## 2019-09-21 DIAGNOSIS — M25512 Pain in left shoulder: Secondary | ICD-10-CM

## 2019-09-21 DIAGNOSIS — M62830 Muscle spasm of back: Secondary | ICD-10-CM

## 2019-09-21 DIAGNOSIS — L732 Hidradenitis suppurativa: Secondary | ICD-10-CM

## 2019-09-21 DIAGNOSIS — R7303 Prediabetes: Secondary | ICD-10-CM

## 2019-09-21 DIAGNOSIS — G8929 Other chronic pain: Secondary | ICD-10-CM

## 2019-09-21 DIAGNOSIS — Z789 Other specified health status: Secondary | ICD-10-CM

## 2019-09-21 DIAGNOSIS — M5412 Radiculopathy, cervical region: Secondary | ICD-10-CM

## 2019-09-21 DIAGNOSIS — F331 Major depressive disorder, recurrent, moderate: Secondary | ICD-10-CM

## 2019-09-21 MED ORDER — TRAMADOL HCL 50 MG PO TABS: 50.0000 mg | ORAL_TABLET | Freq: Two times a day (BID) | ORAL | 0 refills | Status: DC | PRN

## 2019-09-21 MED ORDER — CLINDAMYCIN PHOSPHATE 1 % EX GEL: Freq: Two times a day (BID) | CUTANEOUS | 3 refills | Status: DC

## 2019-09-21 MED ORDER — CYCLOBENZAPRINE HCL 10 MG PO TABS: 10.0000 mg | ORAL_TABLET | Freq: Three times a day (TID) | ORAL | 3 refills | Status: DC | PRN

## 2019-09-21 MED ORDER — ESCITALOPRAM OXALATE 10 MG PO TABS: 10.0000 mg | ORAL_TABLET | Freq: Every day | ORAL | 0 refills | Status: DC

## 2019-09-21 MED ORDER — TRAMADOL HCL 50 MG PO TABS: 50.0000 mg | ORAL_TABLET | Freq: Two times a day (BID) | ORAL | 0 refills | Status: AC | PRN

## 2019-09-21 NOTE — Progress Notes (Signed)
Virtual Visit via Telephone Note  I connected with Samantha [name] on 09/21/19 at 10:50 AM EST by telephone and verified that I am speaking with the correct person using two identifiers.   I discussed the limitations, risks, security and privacy concerns of performing an evaluation and management service by telephone and the availability of in person appointments. I also discussed with the patient that there may be a patient responsible charge related to this service. The patient expressed understanding and agreed to proceed.  Patient Location: Home Provider Location: [facility] Others participating in call: none   History of Present Illness:         44 yo female seen in follow-up of chronic medical issues including chronic left shoulder pain, cervical radiculopathy, chronic depression, hidradenitis suppurativa, and prediabetes.  She reports that she is having continued issues with shoulder and neck pain with radiation of pain down the left arm and numbness and tingling in both hands.  Pain is sharp in nature.  She has not received any information regarding orthopedic follow-up appointment.  She states that her current pain is between an 8-9 on a 0-to-10 scale.  She feels more depressed when she has increased pain.  She thinks that the Lexapro was helping with her anxiety but she admits that she has not taken the medication in a few days.            She is using the clindamycin gel and recently picked up a refill of doxycycline for the hidradenitis.  She reports that with daily use of the clindamycin gel, that the boils are smaller and less painful.  She stopped taking the Metformin for her prediabetes after 2 to 3 days as she states that her mother told her that this medication caused to some people lose a lot of weight.  Patient states that due to her depression she does not have much of an appetite and only eats about twice daily and was concerned that she would lose weight if she was taking the  Metformin.            She reports that she was contacted by Education officer, museum regarding her depression after her last visit but her appointment was rescheduled and she has not heard back about the appointment being rescheduled.  She reports that she will restart the Lexapro and she currently takes Inderal 10 mg as needed to help with anxiety.  She would like to have refill of Flexeril to help with muscle spasm/back and shoulder pain.  She continues to take the pantoprazole and denies any current acid reflux symptoms or abdominal pain.  On review of systems, she denies current headache or dizziness, no chest pain or palpitation, no shortness of breath or cough, no abdominal pain-no nausea/vomiting/diarrhea or constipation.  She does continue to feel fatigued.  She denies any recent fever or chills.  No current increased thirst or urinary frequency.   Past Medical History:  Diagnosis Date  . Asthma   . Back pain   . 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 Hydradenitis   . Hyperlipidemia   . TIA (transient ischemic attack) 04/2015   questionable, most likely complicated migraine    Past Surgical History:  Procedure Laterality Date  . CERVICAL BIOPSY  W/ LOOP ELECTRODE EXCISION    . ESOPHAGOGASTRODUODENOSCOPY (EGD) WITH PROPOFOL N/A 10/14/2018   Procedure: ESOPHAGOGASTRODUODENOSCOPY (EGD) WITH PROPOFOL;  Surgeon: Wilford Corner, MD;  Location: [facility];  Service: Endoscopy;  Laterality: N/A;  . LEEP  Family History  Problem Relation Age of Onset  . Hypertension Mother   . Lupus Mother   . Diabetes Father   . Thyroid disease Sister   . Diabetes Sister   . Cancer Sister        stomach  . Diabetes Maternal Grandmother   . Anesthesia problems Neg Hx   . Other Neg Hx   . Migraines Neg Hx     Social History   Tobacco Use  . Smoking status: Current Every Day Smoker    Packs/day: 1.00    Years: 17.00    Pack years: 17.00    Types: Cigars  . Smokeless tobacco: Never Used  . Tobacco comment:  3 cigarettes a day   Substance Use Topics  . Alcohol use: Yes    Alcohol/week: 1.0 standard drinks    Types: 1 Shots of liquor per week    Comment: drink on the weekends  . Drug use: Yes    Frequency: 3.0 times per week    Types: Marijuana     No Known Allergies     Observations/Objective: No vital signs or physical exam conducted as visit was done via telephone  Assessment and Plan: 1. Chronic left shoulder pain; 2.  Cervical radiculopathy Will contact referral coordinator regarding patient's referral to orthopedics in follow-up of her chronic left shoulder pain and cervical radiculopathy.  Refill provided of cyclobenzaprine per patient request to help with muscle spasm.  Will also send prescription in for tramadol 50 mg twice daily for 30 days and hopefully during this timeframe, patient can have follow-up with orthopedics.  She should call or return to this office sooner if she has increased pain or any other concerns. - cyclobenzaprine (FLEXERIL) 10 MG tablet; Take 1 tablet (10 mg total) by mouth 3 (three) times daily as needed for muscle spasms.  Dispense: 30 tablet; Refill: 3 - traMADol (ULTRAM) 50 MG tablet; Take 1 tablet (50 mg total) by mouth every 12 (twelve) hours as needed for up to 30 days for moderate pain.  Dispense: 60 tablet; Refill: 0  3. Hidradenitis suppurativa Continue daily use of clindamycin gel to help reduce severity of hidradenitis suppurativa and patient is also encouraged to restart the use of Metformin which has also been shown to decrease hidradenitis outbreaks. - clindamycin (CLINDAGEL) 1 % gel; Apply topically 2 (two) times daily. To affected skin areas where boils occur  Dispense: 30 g; Refill: 3  4. Moderate recurrent major depression ([place]); Need for social work follow-up Refill provided of Lexapro and patient is encouraged to take the medication on a daily basis.  New referral will be placed with social work to help patient establish further mental  health care as well as for counseling for current depression. - escitalopram (LEXAPRO) 10 MG tablet; Take 1 tablet (10 mg total) by mouth at bedtime.  Dispense: 90 tablet; Refill: 0  5. Muscle spasm of back Refill provided of cyclobenzaprine to help with muscle spasm of upper and lower back and neck area as patient with chronic left shoulder pain and cervical radicular symptoms.  She is aware that the medication causes drowsiness and she should not drive or operate machinery after taking this medication. - cyclobenzaprine (FLEXERIL) 10 MG tablet; Take 1 tablet (10 mg total) by mouth 3 (three) times daily as needed for muscle spasms.  Dispense: 30 tablet; Refill: 3  6. Prediabetes Discussed with the patient that the Metformin will help with insulin resistance and help the body better regulate storage  and release of blood sugar.  Also Metformin use has been shown to decrease flareups of hidradenitis.  Patient is encouraged to restart Metformin once daily and increase as tolerated to twice per day as long as she is not experiencing any significant weight loss.  If she feels that she is losing weight secondary to the use of Metformin she should contact the office.  Healthy, low carbohydrate diet encouraged.  Follow Up Instructions:Return in about 8 weeks (around 11/16/2019) for chronic issues.    I discussed the assessment and treatment plan with the patient. The patient was provided an opportunity to ask questions and all were answered. The patient agreed with the plan and demonstrated an understanding of the instructions.   The patient was advised to call back or seek an in-person evaluation if the symptoms worsen or if the condition fails to improve as anticipated.  I provided 14 minutes of non-face-to-face time during this encounter.   Antony Blackbird, MD

## 2019-09-22 MED FILL — CLINDAMYCIN PH 1% GEL: 1 | 30 days supply | Qty: 60 | Fill #0

## 2019-09-22 MED FILL — traMADol HCL 50 MG TABS: 50 | 30 days supply | Qty: 60 | Fill #0

## 2019-09-22 MED FILL — ESCITALOPRAM 10 MG TABLET: 10 | 30 days supply | Qty: 30 | Fill #0

## 2019-10-02 ENCOUNTER — Ambulatory Visit: Payer: Self-pay | Attending: Family Medicine

## 2019-10-02 ENCOUNTER — Other Ambulatory Visit: Payer: Self-pay

## 2019-10-15 ENCOUNTER — Encounter: Payer: No Typology Code available for payment source | Admitting: Obstetrics and Gynecology

## 2019-10-15 MED FILL — DOXYCYCLINE HYCLATE 100 MG: 100 | 10 days supply | Qty: 20 | Fill #0

## 2019-10-16 ENCOUNTER — Ambulatory Visit (INDEPENDENT_AMBULATORY_CARE_PROVIDER_SITE_OTHER): Payer: No Typology Code available for payment source | Admitting: Obstetrics & Gynecology

## 2019-10-16 ENCOUNTER — Telehealth: Payer: Self-pay | Admitting: Family Medicine

## 2019-10-16 ENCOUNTER — Encounter: Payer: Self-pay | Admitting: Obstetrics & Gynecology

## 2019-10-16 ENCOUNTER — Other Ambulatory Visit: Payer: Self-pay

## 2019-10-16 VITALS — BP 113/79 | HR 97 | Wt 152.5 lb

## 2019-10-16 DIAGNOSIS — N939 Abnormal uterine and vaginal bleeding, unspecified: Secondary | ICD-10-CM

## 2019-10-16 NOTE — Progress Notes (Signed)
Patient ID: Samantha [name], female   DOB: 04-04-1976, 44 y.o.   MRN: [id number]  Chief Complaint  Patient presents with  . Menstrual Problem    HPI Samantha [name] is a 44 y.o. female.  [id number] Patient's last menstrual period was 10/11/2019. In December her period began 12/9 and lasted for 3 weeks. Since the she is back on Metformin for prediabetes. Her last cycle was normal. She does have occasional abdominal pain with gas, constipation/frequent BM. She uses no BCM and has been on DMPA in the past.  HPI  Past Medical History:  Diagnosis Date  . Asthma   . Back pain   . 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 Hydradenitis   . Hyperlipidemia   . TIA (transient ischemic attack) 04/2015   questionable, most likely complicated migraine    Past Surgical History:  Procedure Laterality Date  . CERVICAL BIOPSY  W/ LOOP ELECTRODE EXCISION    . ESOPHAGOGASTRODUODENOSCOPY (EGD) WITH PROPOFOL N/A 10/14/2018   Procedure: ESOPHAGOGASTRODUODENOSCOPY (EGD) WITH PROPOFOL;  Surgeon: Wilford Corner, MD;  Location: [facility];  Service: Endoscopy;  Laterality: N/A;  . LEEP      Family History  Problem Relation Age of Onset  . Hypertension Mother   . Lupus Mother   . Diabetes Father   . Thyroid disease Sister   . Diabetes Sister   . Cancer Sister        stomach  . Diabetes Maternal Grandmother   . Anesthesia problems Neg Hx   . Other Neg Hx   . Migraines Neg Hx     Social History Social History   Tobacco Use  . Smoking status: Current Every Day Smoker    Packs/day: 1.00    Years: 17.00    Pack years: 17.00    Types: Cigars  . Smokeless tobacco: Never Used  . Tobacco comment: 3 cigarettes a day   Substance Use Topics  . Alcohol use: Yes    Alcohol/week: 1.0 standard drinks    Types: 1 Shots of liquor per week    Comment: drink on the weekends  . Drug use: Yes    Frequency: 3.0 times per week    Types: Marijuana    No Known Allergies  Current Outpatient Medications  Medication Sig  Dispense Refill  . albuterol (PROVENTIL HFA;VENTOLIN HFA) 108 (90 Base) MCG/ACT inhaler Inhale 2 puffs into the lungs every 4 (four) hours as needed for wheezing or shortness of breath (cough, shortness of breath or wheezing.). 1 Inhaler 1  . clindamycin (CLINDAGEL) 1 % gel Apply topically 2 (two) times daily. To affected skin areas where boils occur 30 g 3  . cyclobenzaprine (FLEXERIL) 10 MG tablet Take 1 tablet (10 mg total) by mouth 3 (three) times daily as needed for muscle spasms. 30 tablet 3  . doxycycline (VIBRA-TABS) 100 MG tablet Take 1 tablet (100 mg total) by mouth 2 (two) times daily. 20 tablet 0  . escitalopram (LEXAPRO) 10 MG tablet Take 1 tablet (10 mg total) by mouth at bedtime. 90 tablet 0  . gabapentin (NEURONTIN) 300 MG capsule Take 1 capsule (300 mg total) by mouth 3 (three) times daily as needed. 90 capsule 3  . metFORMIN (GLUCOPHAGE) 500 MG tablet Take 1 tablet (500 mg total) by mouth 2 (two) times daily with a meal. 180 tablet 3  . propranolol (INDERAL) 10 MG tablet Take 1 tablet (10 mg total) by mouth 4 (four) times daily as needed. 60 tablet 1  . traMADol (ULTRAM) 50 MG tablet  Take 1 tablet (50 mg total) by mouth every 12 (twelve) hours as needed for moderate pain. 60 tablet 0  . pantoprazole (PROTONIX) 40 MG tablet Take 1 tablet (40 mg total) by mouth 2 (two) times daily. (Patient not taking: Reported on 10/16/2019) 60 tablet 3   No current facility-administered medications for this visit.    Review of Systems Review of Systems  Constitutional: Negative.   Gastrointestinal: Positive for abdominal distention, abdominal pain and constipation. Negative for nausea.  Genitourinary: Positive for menstrual problem. Negative for vaginal bleeding and vaginal discharge.  Psychiatric/Behavioral: The patient is nervous/anxious (panic attacka).     Blood pressure 113/79, pulse 97, weight 152 lb 8 oz (69.2 kg), last menstrual period 10/11/2019.  Physical Exam Physical  Exam Constitutional:      Appearance: Normal appearance.  Pulmonary:     Effort: Pulmonary effort is normal.  Abdominal:     General: There is distension (mild).     Palpations: Abdomen is soft. There is no mass.     Tenderness: There is no abdominal tenderness.  Neurological:     Mental Status: She is alert.     Data Reviewed Pap test up to date normal  Assessment AUB perimenopausal Prediabetes on metformin Anxiety Plan Metformin to continue and record her cycles. Does not request contraception Pelvic US to r/o fibroids, adenomyosis  RTC 4 months Keep appt with PCP, may need to increase Lexapro    Emeterio Reeve 10/16/2019, 11:38 AM

## 2019-10-16 NOTE — Patient Instructions (Signed)
Menorrhagia Menorrhagia is when your menstrual periods are heavy or last longer than normal. Follow these instructions at home: Medicines   Take over-the-counter and prescription medicines exactly as told by your doctor. This includes iron pills.  Do not change or switch medicines without asking your doctor.  Do not take aspirin or medicines that contain aspirin 1 week before or during your period. Aspirin may make bleeding worse. General instructions  If you need to change your pad or tampon more than once every 2 hours, limit your activity until the bleeding stops.  Iron pills can cause problems when pooping (constipation). To prevent or treat pooping problems while taking prescription iron pills, your doctor may suggest that you: ? Drink enough fluid to keep your pee (urine) clear or pale yellow. ? Take over-the-counter or prescription medicines. ? Eat foods that are high in fiber. These foods include:  Fresh fruits and vegetables.  Whole grains.  Beans. ? Limit foods that are high in fat and processed sugars. This includes fried and sweet foods.  Eat healthy meals and foods that are high in iron. Foods that have a lot of iron include: ? Leafy green vegetables. ? Meat. ? Liver. ? Eggs. ? Whole grain breads and cereals.  Do not try to lose weight until your heavy bleeding has stopped and you have normal amounts of iron in your blood. If you need to lose weight, work with your doctor.  Keep all follow-up visits as told by your doctor. This is important. Contact a doctor if:  You soak through a pad or tampon every 1 or 2 hours, and this happens every time you have a period.  You need to use pads and tampons at the same time because you are bleeding so much.  You are taking medicine and you: ? Feel sick to your stomach (nauseous). ? Throw up (vomit). ? Have watery poop (diarrhea).  You have other problems that may be related to the medicine you are taking. Get help  right away if:  You soak through more than a pad or tampon in 1 hour.  You pass clots bigger than 1 inch (2.5 cm) wide.  You feel short of breath.  You feel like your heart is beating too fast.  You feel dizzy or you pass out (faint).  You feel very weak or tired. Summary  Menorrhagia is when your menstrual periods are heavy or last longer than normal.  Take over-the-counter and prescription medicines exactly as told by your doctor. This includes iron pills.  Contact a doctor if you soak through more than a pad or tampon in 1 hour or are passing large clots. This information is not intended to replace advice given to you by your health care provider. Make sure you discuss any questions you have with your health care provider. Document Revised: 12/04/2017 Document Reviewed: 09/17/2016 Elsevier Patient Education  2020 Elsevier Inc.  

## 2019-10-16 NOTE — Progress Notes (Signed)
Pt declined [facility] already has a Teacher, music.

## 2019-10-16 NOTE — Telephone Encounter (Signed)
Pt was sent a letter from financial dept. Inform them, that the application they submitted was incomplete, since they were missing some documentation at the time of the appointment, Pt need to reschedule and resubmit all new papers and application for CAFA and [place], P.S. old documents has been sent back by mail to the Pt and Pt. need to make a new appt. 

## 2019-10-21 ENCOUNTER — Ambulatory Visit: Payer: PRIVATE HEALTH INSURANCE | Attending: Internal Medicine

## 2019-10-21 ENCOUNTER — Telehealth: Payer: Self-pay | Admitting: Family Medicine

## 2019-10-21 DIAGNOSIS — Z20822 Contact with and (suspected) exposure to covid-19: Secondary | ICD-10-CM | POA: Insufficient documentation

## 2019-10-21 NOTE — Telephone Encounter (Signed)
I just received some documents I was missing to complete the CAFA application but since the Pt had a dateline on [id number], the application and the documents was sent back to her on 10/17/19, also am returning these new documents she drop up today 10/21/19

## 2019-10-22 LAB — NOVEL CORONAVIRUS, NAA: SARS-CoV-2, NAA: NOT DETECTED

## 2019-10-27 ENCOUNTER — Telehealth: Payer: Self-pay | Admitting: Licensed Clinical Social Worker

## 2019-10-27 NOTE — Telephone Encounter (Signed)
Call placed to patient regarding [facility] referral from Dr. Chapman Fitch.   Pt shared that her depression has been triggered by the pain from boils, despite, compliance with Lexapro to assist in the management of symptoms. Pt reports that she has ran out of Doxycycline, which has been effective in the past in resolving boils. Pt disclosed that she is awaiting a Non Filing Status letter from the IRS to complete her CAFA application.   LCSW messaged Provider about filling requested medication. Pt was successful in re-scheduling appointment with Financial Counselor, due to paperwork not being received by 10/17/19  Pt denies need of any additional behavioral health or resource needs. LCSW informed pt to contact her if anything should change.

## 2019-11-24 ENCOUNTER — Ambulatory Visit (HOSPITAL_COMMUNITY): Payer: Self-pay | Attending: Obstetrics & Gynecology

## 2019-12-07 ENCOUNTER — Other Ambulatory Visit: Payer: Self-pay

## 2019-12-07 ENCOUNTER — Ambulatory Visit: Payer: Self-pay | Attending: Family Medicine

## 2019-12-10 ENCOUNTER — Ambulatory Visit: Payer: Self-pay

## 2019-12-21 ENCOUNTER — Ambulatory Visit (INDEPENDENT_AMBULATORY_CARE_PROVIDER_SITE_OTHER): Payer: Self-pay | Admitting: Orthopedic Surgery

## 2019-12-21 ENCOUNTER — Other Ambulatory Visit: Payer: Self-pay

## 2019-12-21 ENCOUNTER — Ambulatory Visit (INDEPENDENT_AMBULATORY_CARE_PROVIDER_SITE_OTHER): Payer: Self-pay

## 2019-12-21 DIAGNOSIS — M79602 Pain in left arm: Secondary | ICD-10-CM

## 2019-12-21 DIAGNOSIS — M5030 Other cervical disc degeneration, unspecified cervical region: Secondary | ICD-10-CM

## 2019-12-21 DIAGNOSIS — M792 Neuralgia and neuritis, unspecified: Secondary | ICD-10-CM

## 2019-12-25 ENCOUNTER — Encounter: Payer: Self-pay | Admitting: Orthopedic Surgery

## 2019-12-25 NOTE — Progress Notes (Signed)
Office Visit Note   Patient: Samantha [name]           Date of Birth: Jun 11, 1976           MRN: [id number] Visit Date: 12/21/2019 Requested by: Antony Blackbird, MD [place],  [id number] PCP: Scot Jun, FNP  Subjective: Chief Complaint  Patient presents with  . Neck - Pain  . Left Shoulder - Pain    HPI: Samantha [name] is a 44 y.o. female who presents to the office complaining of neck and left shoulder pain.  Patient complains of pain over the last year.  She denies any injury leading to onset of pain.  She notes neck pain with radicular pain down her left arm.  Pain has a burning quality.  She also notes numbness and tingling down her left arm.  She denies any history of neck surgery or MRI.  She also has pain that travels into her left shoulder blade.  She notes subjective weakness of the left arm.  Her symptoms are better with holding her arm above her head.  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                ROS:  All systems reviewed are negative as they relate to the chief complaint within the history of present illness.  Patient denies fevers or chills.  Assessment & Plan: Visit Diagnoses:  1. Radicular pain in left arm   2. Left arm pain   3. DDD (degenerative disc disease), cervical     Plan: Patient is a 44 year old female who presents complaining of neck pain with left arm radicular symptoms.  Symptoms have been present for 1 year.  Symptoms are not getting better despite time and a home exercise program.  X-rays taken today reveal degeneration of the cervical spine especially around C5-C6 with loss of lordosis.  She has subjective weakness and is dropping objects but no frank weakness on exam today.  Regardless, with the long duration of her symptoms and how debilitating they are in her everyday life, ordered MRI of the cervical spine for further evaluation.  Patient will follow up after MRI to review  results.  May need surgery versus epidural steroid injections depending on the findings.  Her preference would be for quicker resolution to the problem which may be surgical.  Follow-Up Instructions: No follow-ups on file.   Orders:  Orders Placed This Encounter  Procedures  . XR Cervical Spine 2 or 3 views  . XR Shoulder Left  . MR Cervical Spine w/o contrast   No orders of the defined types were placed in this encounter.     Procedures: No procedures performed   Clinical Data: No additional findings.  Objective: Vital Signs: There were no vitals taken for this visit.  Physical Exam:  Constitutional: Patient appears well-developed HEENT:  Head: Normocephalic Eyes:EOM are normal Neck: Normal range of motion Cardiovascular: Normal rate Pulmonary/chest: Effort normal Neurologic: Patient is alert Skin: Skin is warm Psychiatric: Patient has normal mood and affect   Ortho Exam:  Left shoulder Exam Able to fully forward flex and abduct shoulder overhead No loss of ER relative to the other shoulder.  Good endpoint with ER No TTP over the [facility] joint or bicipital groove Good subscapularis, supraspinatus, and infraspinatus strength Negative Hawkins impingement 5/5 grip strength, forearm pronation/supination, and bicep strength Mild to moderate tenderness to palpation throughout the  axial cervical spine and paraspinal musculature, worse on the left side.  5/5 motor strength of the bilateral grip strength, finger abduction, pronation, supination, bicep flexion, tricep extension, deltoid.  Decreased sensation throughout all dermatomes of the left upper extremity compared with the contralateral side.  Specialty Comments:  No specialty comments available.  Imaging: No results found.   PMFS History: Patient Active Problem List   Diagnosis Date Noted  . GIB (gastrointestinal bleeding) 10/13/2018  . Amenorrhea 09/17/2017  . Adjustment disorder with mixed anxiety and depressed  mood 06/26/2016  . Depressed mood 06/26/2016  . Back pain 01/31/2016  . 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 Acute pericarditis 12/26/2015  . Hidradenitis suppurativa 11/07/2015  . 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 Sinusitis 10/20/2015  . 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 Marijuana use 10/20/2015  . Smoker 05/04/2015  . TIA (transient ischemic attack) 05/04/2015  . Palpitations 05/04/2015  . Decreased appetite 05/04/2015  . 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 Hyperlipidemia 04/22/2015  . TOA (tubo-ovarian abscess) 11/23/2012  . History of abnormal Pap smear 12/20/2011  . History of PID 12/20/2011   Past Medical History:  Diagnosis Date  . Asthma   . Back pain   . 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 Hydradenitis   . Hyperlipidemia   . TIA (transient ischemic attack) 04/2015   questionable, most likely complicated migraine    Family History  Problem Relation Age of Onset  . Hypertension Mother   . Lupus Mother   . Diabetes Father   . Thyroid disease Sister   . Diabetes Sister   . Cancer Sister        stomach  . Diabetes Maternal Grandmother   . Anesthesia problems Neg Hx   . Other Neg Hx   . Migraines Neg Hx     Past Surgical History:  Procedure Laterality Date  . CERVICAL BIOPSY  W/ LOOP ELECTRODE EXCISION    . ESOPHAGOGASTRODUODENOSCOPY (EGD) WITH PROPOFOL N/A 10/14/2018   Procedure: ESOPHAGOGASTRODUODENOSCOPY (EGD) WITH PROPOFOL;  Surgeon: Wilford Corner, MD;  Location: [facility];  Service: Endoscopy;  Laterality: N/A;  . LEEP     Social History   Occupational History  . Not on file  Tobacco Use  . Smoking status: Current Every Day Smoker    Packs/day: 1.00    Years: 17.00    Pack years: 17.00    Types: Cigars  . Smokeless tobacco: Never Used  . Tobacco comment: 3 cigarettes a day   Substance and Sexual Activity  . Alcohol use: Yes    Alcohol/week: 1.0 standard drinks    Types: 1 Shots of liquor per week    Comment: drink on the weekends    . Drug use: Yes    Frequency: 3.0 times per week    Types: Marijuana  . Sexual activity: Not Currently    Birth control/protection: None

## 2019-12-30 ENCOUNTER — Other Ambulatory Visit: Payer: Self-pay | Admitting: Internal Medicine

## 2019-12-30 ENCOUNTER — Telehealth: Payer: Self-pay | Admitting: Family Medicine

## 2019-12-30 DIAGNOSIS — L732 Hidradenitis suppurativa: Secondary | ICD-10-CM

## 2019-12-30 MED ORDER — CYCLOBENZAPRINE HCL 10 MG PO TABS: 10.0000 mg | ORAL_TABLET | Freq: Three times a day (TID) | ORAL | 0 refills | Status: DC | PRN

## 2019-12-30 MED ORDER — DOXYCYCLINE HYCLATE 100 MG PO TABS: 100.0000 mg | ORAL_TABLET | Freq: Two times a day (BID) | ORAL | 0 refills | Status: DC

## 2019-12-30 MED FILL — GABAPENTIN 300 MG CAPSULE: 300 | 30 days supply | Qty: 90 | Fill #2

## 2019-12-30 NOTE — Telephone Encounter (Signed)
Pt came in asking for cyclobenzaprine (FLEXERIL) 10 MG and  doxycycline (VIBRAMYCIN) 100 MG capsule for the boil she has on her butt and arms

## 2019-12-30 NOTE — Telephone Encounter (Signed)
Medications were sent. It looks like she has a history of Hidradenitis Suppurativa. If she feels like she is having worsening of this, I would recommend she make an office visit.   Phill Myron, D.O. [facility]  12/30/2019, 10:46 AM

## 2019-12-30 NOTE — Telephone Encounter (Signed)
Called and informed pt she said if the meds don't work she would make an appointment

## 2020-01-01 ENCOUNTER — Other Ambulatory Visit: Payer: Self-pay | Admitting: Family Medicine

## 2020-01-01 DIAGNOSIS — L732 Hidradenitis suppurativa: Secondary | ICD-10-CM

## 2020-01-01 MED FILL — CYCLOBENZAPRINE 10 MG TAB: 10 | 10 days supply | Qty: 30 | Fill #0

## 2020-01-15 ENCOUNTER — Ambulatory Visit: Payer: Self-pay | Admitting: Internal Medicine

## 2020-01-15 ENCOUNTER — Ambulatory Visit
Admission: RE | Admit: 2020-01-15 | Discharge: 2020-01-15 | Disposition: A | Discharge status: Home / Self Care | Payer: Self-pay | Source: Ambulatory Visit | Attending: Orthopedic Surgery | Admitting: Orthopedic Surgery

## 2020-01-15 ENCOUNTER — Other Ambulatory Visit: Payer: Self-pay

## 2020-01-15 DIAGNOSIS — M79602 Pain in left arm: Secondary | ICD-10-CM

## 2020-01-20 ENCOUNTER — Ambulatory Visit (INDEPENDENT_AMBULATORY_CARE_PROVIDER_SITE_OTHER): Payer: Self-pay

## 2020-01-20 ENCOUNTER — Other Ambulatory Visit: Payer: Self-pay

## 2020-01-20 ENCOUNTER — Ambulatory Visit (INDEPENDENT_AMBULATORY_CARE_PROVIDER_SITE_OTHER): Payer: Self-pay | Admitting: Orthopedic Surgery

## 2020-01-20 DIAGNOSIS — M792 Neuralgia and neuritis, unspecified: Secondary | ICD-10-CM

## 2020-01-20 DIAGNOSIS — M5440 Lumbago with sciatica, unspecified side: Secondary | ICD-10-CM

## 2020-01-22 ENCOUNTER — Encounter: Payer: Self-pay | Admitting: Orthopedic Surgery

## 2020-01-22 NOTE — Progress Notes (Signed)
Office Visit Note   Patient: Samantha [name]           Date of Birth: [birth year]           MRN: [id number] Visit Date: 01/20/2020 Requested by: Scot Jun, [place],  [place] [id number] PCP: Scot Jun, FNP  Subjective: Chief Complaint  Patient presents with  . Follow-up    HPI: Samantha [name] is a 44 y.o. female who presents to the office complaining of neck pain and low back pain.  She returns to discuss C-spine MRI results.  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  Patient also complains of low back pain.  Pain has been going on as long as her cervical symptoms.  She states they are "just as bad as her neck feels".  She wants to avoid surgery at all cost.  She notes it is hard to hold her head up at times..                ROS:  All systems reviewed are negative as they relate to the chief complaint within the history of present illness.  Patient denies fevers or chills.  Assessment & Plan: Visit Diagnoses:  1. Acute right-sided low back pain with sciatica, sciatica laterality unspecified   2. Acute low back pain with sciatica, sciatica laterality unspecified, unspecified back pain laterality   3. Radicular pain in left arm     Plan: Patient is a 44 year old female presents complaining of neck and low back pain.  She returns to review C-spine MRI results.  MRI results are as discussed above.  Plan to refer patient to Dr. Laurence Spates for cervical spine ESI's.  Additionally, plan to start therapy here for lumbar spine 2 times a week for the next 4 weeks.  If no improvement with physical therapy, will consider further imaging of the lumbar spine.  Patient agreed with plan.  Follow-Up Instructions: No follow-ups on file.   Orders:  Orders Placed This Encounter  Procedures  . XR Lumbar  Spine 2-3 Views  . Ambulatory referral to Physical Therapy  . Ambulatory referral to Physical Medicine Rehab   No orders of the defined types were placed in this encounter.     Procedures: No procedures performed   Clinical Data: No additional findings.  Objective: Vital Signs: There were no vitals taken for this visit.  Physical Exam:  Constitutional: Patient appears well-developed HEENT:  Head: Normocephalic Eyes:EOM are normal Neck: Normal range of motion Cardiovascular: Normal rate Pulmonary/chest: Effort normal Neurologic: Patient is alert Skin: Skin is warm Psychiatric: Patient has normal mood and affect  Ortho Exam:  Tender to palpation throughout the axial cervical spine, worse at the inferior C-spine.  Tender along the paraspinal musculature of the cervical spine.  Tender to palpation throughout the axial lumbar spine paraspinal musculature.  5/5 motor strength of the bilateral hip flexors, quadriceps, hamstring, dorsiflexion, plantarflexion.  This sensation changes noted throughout the dermatomes of the bilateral legs.  Specialty Comments:  No specialty comments available.  Imaging: No results found.   PMFS History: Patient Active Problem List   Diagnosis Date Noted  . GIB (gastrointestinal bleeding) 10/13/2018  . Amenorrhea 09/17/2017  . Adjustment disorder with mixed anxiety and depressed mood 06/26/2016  . Depressed mood 06/26/2016  . Back pain 01/31/2016  .  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 Acute pericarditis 12/26/2015  . Hidradenitis suppurativa 11/07/2015  . 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 Sinusitis 10/20/2015  . 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 Marijuana use 10/20/2015  . Smoker 05/04/2015  . TIA (transient ischemic attack) 05/04/2015  . Palpitations 05/04/2015  . Decreased appetite 05/04/2015  . 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 Hyperlipidemia 04/22/2015  . TOA (tubo-ovarian abscess) 11/23/2012  . History of abnormal  Pap smear 12/20/2011  . History of PID 12/20/2011   Past Medical History:  Diagnosis Date  . Asthma   . Back pain   . 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 Hydradenitis   . Hyperlipidemia   . TIA (transient ischemic attack) 04/2015   questionable, most likely complicated migraine    Family History  Problem Relation Age of Onset  . Hypertension Mother   . Lupus Mother   . Diabetes Father   . Thyroid disease Sister   . Diabetes Sister   . Cancer Sister        stomach  . Diabetes Maternal Grandmother   . Anesthesia problems Neg Hx   . Other Neg Hx   . Migraines Neg Hx     Past Surgical History:  Procedure Laterality Date  . CERVICAL BIOPSY  W/ LOOP ELECTRODE EXCISION    . ESOPHAGOGASTRODUODENOSCOPY (EGD) WITH PROPOFOL N/A 10/14/2018   Procedure: ESOPHAGOGASTRODUODENOSCOPY (EGD) WITH PROPOFOL;  Surgeon: Wilford Corner, MD;  Location: [facility];  Service: Endoscopy;  Laterality: N/A;  . LEEP     Social History   Occupational History  . Not on file  Tobacco Use  . Smoking status: Current Every Day Smoker    Packs/day: 1.00    Years: 17.00    Pack years: 17.00    Types: Cigars  . Smokeless tobacco: Never Used  . Tobacco comment: 3 cigarettes a day   Substance and Sexual Activity  . Alcohol use: Yes    Alcohol/week: 1.0 standard drinks    Types: 1 Shots of liquor per week    Comment: drink on the weekends  . Drug use: Yes    Frequency: 3.0 times per week    Types: Marijuana  . Sexual activity: Not Currently    Birth control/protection: None

## 2020-01-23 ENCOUNTER — Encounter: Payer: Self-pay | Admitting: Orthopedic Surgery

## 2020-02-08 ENCOUNTER — Emergency Department (HOSPITAL_COMMUNITY): Payer: Self-pay

## 2020-02-08 ENCOUNTER — Encounter (HOSPITAL_COMMUNITY): Payer: Self-pay

## 2020-02-08 ENCOUNTER — Other Ambulatory Visit: Payer: Self-pay

## 2020-02-08 ENCOUNTER — Encounter (HOSPITAL_COMMUNITY): Payer: Self-pay | Admitting: Emergency Medicine

## 2020-02-08 ENCOUNTER — Ambulatory Visit (INDEPENDENT_AMBULATORY_CARE_PROVIDER_SITE_OTHER): Payer: Self-pay

## 2020-02-08 ENCOUNTER — Inpatient Hospital Stay (HOSPITAL_COMMUNITY)
Admission: EM | Admit: 2020-02-08 | Discharge: 2020-02-10 | DRG: 201 | Disposition: A | Discharge status: Home / Self Care | Payer: Self-pay | Source: Ambulatory Visit | Attending: Family Medicine | Admitting: Family Medicine

## 2020-02-08 ENCOUNTER — Ambulatory Visit (HOSPITAL_COMMUNITY)
Admission: EM | Admit: 2020-02-08 | Discharge: 2020-02-08 | Disposition: A | Discharge status: Home / Self Care | Payer: Self-pay | Attending: Family Medicine | Admitting: Family Medicine

## 2020-02-08 DIAGNOSIS — Z8673 Personal history of transient ischemic attack (TIA), and cerebral infarction without residual deficits: Secondary | ICD-10-CM

## 2020-02-08 DIAGNOSIS — E119 Type 2 diabetes mellitus without complications: Secondary | ICD-10-CM | POA: Diagnosis present

## 2020-02-08 DIAGNOSIS — F17210 Nicotine dependence, cigarettes, uncomplicated: Secondary | ICD-10-CM | POA: Diagnosis present

## 2020-02-08 DIAGNOSIS — G4700 Insomnia, unspecified: Secondary | ICD-10-CM | POA: Diagnosis present

## 2020-02-08 DIAGNOSIS — J939 Pneumothorax, unspecified: Secondary | ICD-10-CM | POA: Diagnosis present

## 2020-02-08 DIAGNOSIS — Z8639 Personal history of other endocrine, nutritional and metabolic disease: Secondary | ICD-10-CM

## 2020-02-08 DIAGNOSIS — E785 Hyperlipidemia, unspecified: Secondary | ICD-10-CM | POA: Diagnosis present

## 2020-02-08 DIAGNOSIS — F1210 Cannabis abuse, uncomplicated: Secondary | ICD-10-CM | POA: Diagnosis present

## 2020-02-08 DIAGNOSIS — Z20822 Contact with and (suspected) exposure to covid-19: Secondary | ICD-10-CM | POA: Diagnosis present

## 2020-02-08 DIAGNOSIS — J9383 Other pneumothorax: Secondary | ICD-10-CM

## 2020-02-08 DIAGNOSIS — M542 Cervicalgia: Secondary | ICD-10-CM | POA: Diagnosis present

## 2020-02-08 DIAGNOSIS — Z7984 Long term (current) use of oral hypoglycemic drugs: Secondary | ICD-10-CM

## 2020-02-08 DIAGNOSIS — F329 Major depressive disorder, single episode, unspecified: Secondary | ICD-10-CM | POA: Diagnosis present

## 2020-02-08 DIAGNOSIS — J432 Centrilobular emphysema: Secondary | ICD-10-CM | POA: Diagnosis present

## 2020-02-08 DIAGNOSIS — M1990 Unspecified osteoarthritis, unspecified site: Secondary | ICD-10-CM | POA: Diagnosis present

## 2020-02-08 DIAGNOSIS — G8929 Other chronic pain: Secondary | ICD-10-CM | POA: Diagnosis present

## 2020-02-08 DIAGNOSIS — Z79899 Other long term (current) drug therapy: Secondary | ICD-10-CM

## 2020-02-08 DIAGNOSIS — J45909 Unspecified asthma, uncomplicated: Secondary | ICD-10-CM | POA: Diagnosis present

## 2020-02-08 DIAGNOSIS — Z8249 Family history of ischemic heart disease and other diseases of the circulatory system: Secondary | ICD-10-CM

## 2020-02-08 DIAGNOSIS — Z833 Family history of diabetes mellitus: Secondary | ICD-10-CM

## 2020-02-08 LAB — TROPONIN I (HIGH SENSITIVITY)
Troponin I (High Sensitivity): <2 ng/L (ref <18)
Troponin I (High Sensitivity): <2 ng/L (ref <18)

## 2020-02-08 LAB — GLUCOSE, CAPILLARY: Glucose-Capillary: 121 mg/dL — ABNORMAL HIGH (ref 70–99)

## 2020-02-08 LAB — CBC WITH DIFFERENTIAL/PLATELET
Abs Immature Granulocytes: 0.01 10*3/uL (ref 0.00–0.07)
Basophils Absolute: 0.1 10*3/uL (ref 0.0–0.1)
Basophils Relative: 1 %
Eosinophils Absolute: 0.1 10*3/uL (ref 0.0–0.5)
Eosinophils Relative: 2 %
HCT: 43.3 % (ref 36.0–46.0)
Hemoglobin: 14.4 g/dL (ref 12.0–15.0)
Immature Granulocytes: 0 %
Lymphocytes Relative: 57 %
Lymphs Abs: 3.8 10*3/uL (ref 0.7–4.0)
MCH: 30.9 pg (ref 26.0–34.0)
MCHC: 33.3 g/dL (ref 30.0–36.0)
MCV: 92.9 fL (ref 80.0–100.0)
Monocytes Absolute: 0.5 10*3/uL (ref 0.1–1.0)
Monocytes Relative: 7 %
Neutro Abs: 2.2 10*3/uL (ref 1.7–7.7)
Neutrophils Relative %: 33 %
Platelets: 245 10*3/uL (ref 150–400)
RBC: 4.66 MIL/uL (ref 3.87–5.11)
RDW: 12.6 % (ref 11.5–15.5)
WBC: 6.6 10*3/uL (ref 4.0–10.5)
nRBC: 0 % (ref 0.0–0.2)

## 2020-02-08 LAB — BASIC METABOLIC PANEL
Anion gap: 12 (ref 5–15)
BUN: 10 mg/dL (ref 6–20)
CO2: 22 mmol/L (ref 22–32)
Calcium: 9.4 mg/dL (ref 8.9–10.3)
Chloride: 102 mmol/L (ref 98–111)
Creatinine, Ser: 0.81 mg/dL (ref 0.44–1.00)
GFR calc Af Amer: >60 mL/min (ref >60)
GFR calc non Af Amer: >60 mL/min (ref >60)
Glucose, Bld: 95 mg/dL (ref 70–99)
Potassium: 4.5 mmol/L (ref 3.5–5.1)
Sodium: 136 mmol/L (ref 135–145)

## 2020-02-08 LAB — SARS CORONAVIRUS 2 BY RT PCR (HOSPITAL ORDER, PERFORMED IN ~~LOC~~ HOSPITAL LAB): SARS Coronavirus 2: NEGATIVE

## 2020-02-08 MED ORDER — POLYETHYLENE GLYCOL 3350 17 G PO PACK: 17.0000 g | PACK | Freq: Every day | ORAL | Status: DC | PRN

## 2020-02-08 MED ORDER — HYDROMORPHONE HCL 1 MG/ML IJ SOLN
0.5000 mg | Freq: Once | INTRAMUSCULAR | Status: AC
  Administered 2020-02-08: 0.5 mg via INTRAVENOUS
  Filled 2020-02-08: qty 1

## 2020-02-08 MED ORDER — ACETAMINOPHEN 650 MG RE SUPP
650.0000 mg | Freq: Four times a day (QID) | RECTAL | Status: DC | PRN
  Filled 2020-02-08: qty 1

## 2020-02-08 MED ORDER — GABAPENTIN 300 MG PO CAPS
300.0000 mg | ORAL_CAPSULE | Freq: Three times a day (TID) | ORAL | Status: DC | PRN
  Administered 2020-02-08: 300 mg via ORAL
  Filled 2020-02-08: qty 1

## 2020-02-08 MED ORDER — ESCITALOPRAM OXALATE 10 MG PO TABS
10.0000 mg | ORAL_TABLET | Freq: Every day | ORAL | Status: DC
  Administered 2020-02-08 – 2020-02-09 (×2): 10 mg via ORAL
  Filled 2020-02-08 (×2): qty 1

## 2020-02-08 MED ORDER — CHLORHEXIDINE GLUCONATE CLOTH 2 % EX PADS: 6.0000 | MEDICATED_PAD | Freq: Every day | CUTANEOUS | Status: DC

## 2020-02-08 MED ORDER — IOHEXOL 350 MG/ML SOLN
80.0000 mL | Freq: Once | INTRAVENOUS | Status: AC | PRN
  Administered 2020-02-08: 80 mL via INTRAVENOUS

## 2020-02-08 MED ORDER — KETOROLAC TROMETHAMINE 30 MG/ML IJ SOLN
30.0000 mg | Freq: Once | INTRAMUSCULAR | Status: AC
  Administered 2020-02-08: 30 mg via INTRAVENOUS
  Filled 2020-02-08: qty 1

## 2020-02-08 MED ORDER — MELATONIN 5 MG PO TABS
5.0000 mg | ORAL_TABLET | Freq: Every day | ORAL | Status: DC
  Administered 2020-02-08 – 2020-02-09 (×2): 5 mg via ORAL
  Filled 2020-02-08 (×2): qty 1

## 2020-02-08 MED ORDER — ACETAMINOPHEN 325 MG PO TABS
650.0000 mg | ORAL_TABLET | Freq: Four times a day (QID) | ORAL | Status: DC | PRN
  Administered 2020-02-09: 650 mg via ORAL
  Filled 2020-02-08: qty 2

## 2020-02-08 MED ORDER — HYDROMORPHONE HCL 1 MG/ML IJ SOLN: 1.0000 mg | Freq: Once | INTRAMUSCULAR | Status: DC

## 2020-02-08 MED ORDER — METFORMIN HCL 500 MG PO TABS
500.0000 mg | ORAL_TABLET | Freq: Two times a day (BID) | ORAL | Status: DC
  Administered 2020-02-09 – 2020-02-10 (×3): 500 mg via ORAL
  Filled 2020-02-08 (×3): qty 1

## 2020-02-08 MED ORDER — MUPIROCIN 2 % EX OINT: 1.0000 "application " | TOPICAL_OINTMENT | Freq: Two times a day (BID) | CUTANEOUS | Status: DC

## 2020-02-08 MED ORDER — ENOXAPARIN SODIUM 40 MG/0.4ML ~~LOC~~ SOLN
40.0000 mg | SUBCUTANEOUS | Status: DC
  Administered 2020-02-08 – 2020-02-09 (×2): 40 mg via SUBCUTANEOUS
  Filled 2020-02-08 (×2): qty 0.4

## 2020-02-08 MED ORDER — ONDANSETRON HCL 4 MG/2ML IJ SOLN
4.0000 mg | Freq: Once | INTRAMUSCULAR | Status: AC
  Administered 2020-02-08: 4 mg via INTRAVENOUS
  Filled 2020-02-08: qty 2

## 2020-02-08 NOTE — ED Triage Notes (Signed)
Pt c/o 8/10 throbbing left sided chest pain that radiates to left shoulder started yesterday. Pt states when she breathes, it hurts more. Pt c/o nausea and SOB. Pt is slightly tachypneic.
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
## 2020-02-08 NOTE — ED Provider Notes (Signed)
[facility]    CSN: [id number] Arrival date & time: 02/08/20  1251      History   Chief Complaint Chief Complaint  Patient presents with  . Chest Pain    HPI Samantha [name] is a 44 y.o. female.   Patient is a 44 year old female that presents today with left sided chest pain radiating to left shoulder.  Symptoms have been constant over the past day.  Hurts when she takes a deep breaths and with certain movements.  Hard to take a deep breath.  Mild shortness of breath.  Patient does smoke.  No associated cough, chest congestion, fevers, chills or body aches.  Took aspirin with some relief  ROS per HPI      Past Medical History:  Diagnosis Date  . Asthma   . Back pain   . 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 Hydradenitis   . Hyperlipidemia   . TIA (transient ischemic attack) 04/2015   questionable, most likely complicated migraine    Patient Active Problem List   Diagnosis Date Noted  . GIB (gastrointestinal bleeding) 10/13/2018  . Amenorrhea 09/17/2017  . Adjustment disorder with mixed anxiety and depressed mood 06/26/2016  . Depressed mood 06/26/2016  . Back pain 01/31/2016  . 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 Acute pericarditis 12/26/2015  . Hidradenitis suppurativa 11/07/2015  . 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 Sinusitis 10/20/2015  . 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 Marijuana use 10/20/2015  . Smoker 05/04/2015  . TIA (transient ischemic attack) 05/04/2015  . Palpitations 05/04/2015  . Decreased appetite 05/04/2015  . 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 Hyperlipidemia 04/22/2015  . TOA (tubo-ovarian abscess) 11/23/2012  . History of abnormal Pap smear 12/20/2011  . History of PID 12/20/2011    Past Surgical History:  Procedure Laterality Date  . CERVICAL BIOPSY  W/ LOOP ELECTRODE EXCISION    . ESOPHAGOGASTRODUODENOSCOPY (EGD) WITH PROPOFOL N/A 10/14/2018   Procedure: ESOPHAGOGASTRODUODENOSCOPY (EGD) WITH PROPOFOL;  Surgeon: Wilford Corner, MD;  Location: [facility];  Service: Endoscopy;  Laterality: N/A;  . LEEP      OB History    Gravida  2   Para  1   Term  1   Preterm      AB  1   Living  1     SAB  1   TAB      Ectopic      Multiple      Live Births               Home Medications    Prior to Admission medications   Medication Sig Start Date End Date Taking? Authorizing Provider  escitalopram (LEXAPRO) 10 MG tablet Take 1 tablet (10 mg total) by mouth at bedtime. 09/21/19   Fulp, Cammie, MD  gabapentin (NEURONTIN) 300 MG capsule Take 1 capsule (300 mg total) by mouth 3 (three) times daily as needed. 08/10/19   Fulp, Cammie, MD  metFORMIN (GLUCOPHAGE) 500 MG tablet Take 1 tablet (500 mg total) by mouth 2 (two) times daily with a meal. 08/10/19   Fulp, Cammie, MD    Family History Family History  Problem Relation Age of Onset  . Hypertension Mother   . Lupus Mother   . Diabetes Father   . Thyroid disease Sister   . Diabetes Sister   . Cancer Sister        stomach  . Diabetes Maternal Grandmother   . Anesthesia problems Neg Hx   . Other Neg  Hx   . Migraines Neg Hx     Social History Social History   Tobacco Use  . Smoking status: Current Every Day Smoker    Packs/day: 1.00    Years: 17.00    Pack years: 17.00    Types: Cigars  . Smokeless tobacco: Never Used  . Tobacco comment: 3 cigarettes a day   Substance Use Topics  . Alcohol use: Yes    Alcohol/week: 1.0 standard drinks    Types: 1 Shots of liquor per week    Comment: drink on the weekends  . Drug use: Yes    Frequency: 3.0 times per week    Types: Marijuana     Allergies   Patient has no known allergies.   Review of Systems Review of Systems   Physical Exam Triage Vital Signs ED Triage Vitals  Enc Vitals Group     BP 02/08/20 1303 112/78     Pulse Rate 02/08/20 1303 93     Resp 02/08/20 1303 (!) 22     Temp 02/08/20 1303 98.6 F (37 C)     Temp Source 02/08/20 1303 Oral     SpO2 02/08/20  1303 100 %     Weight 02/08/20 1307 154 lb (69.9 kg)     Height 02/08/20 1307 5\' 4"  (1.626 m)     Head Circumference --      Peak Flow --      Pain Score 02/08/20 1307 8     Pain Loc --      Pain Edu? --      Excl. in [place]? --    No data found.  Updated Vital Signs BP 112/78   Pulse 93   Temp 98.6 F (37 C) (Oral)   Resp (!) 22   Ht 5\' 4"  (1.626 m)   Wt 154 lb (69.9 kg)   LMP 01/16/2020 (Exact Date)   SpO2 100%   BMI 26.43 kg/m   Visual Acuity Right Eye Distance:   Left Eye Distance:   Bilateral Distance:    Right Eye Near:   Left Eye Near:    Bilateral Near:     Physical Exam Vitals and nursing note reviewed.  Constitutional:      General: She is not in acute distress.    Appearance: She is not ill-appearing, toxic-appearing or diaphoretic.     Comments: Uncomfortable sitting on exam table  HENT:     Nose: Nose normal.  Eyes:     Conjunctiva/sclera: Conjunctivae normal.  Cardiovascular:     Rate and Rhythm: Normal rate and regular rhythm.  Pulmonary:     Effort: No respiratory distress.     Breath sounds: No stridor. Rhonchi present. No wheezing or rales.     Comments: Decreased lung sounds and lower left Rhonchi to right upper and lower lobes Pain with deep breathing  Chest:     Chest wall: No tenderness.  Musculoskeletal:        General: Normal range of motion.     Comments: Nontender to left chest wall, left shoulder or left upper back. No pain with range of motion of the left arm or shoulder Strength equal bilateral 2+ radial pulse in left wrist  Skin:    General: Skin is warm and dry.  Neurological:     Mental Status: She is alert.  Psychiatric:        Mood and Affect: Mood normal.      UC Treatments / Results  Labs (all labs  ordered are listed, but only abnormal results are displayed) Labs Reviewed - No data to display  EKG   Radiology DG Chest 2 View  Result Date: 02/08/2020 CLINICAL DATA:  Acute chest pain for 1 day. EXAM: CHEST  - 2 VIEW COMPARISON:  10/15/2018 prior radiographs.  11/17/2018 chest CT FINDINGS: A 15% LEFT apical/lateral pneumothorax is noted. The lungs are clear. No pleural effusion or RIGHT pneumothorax. The cardiomediastinal silhouette is unremarkable. No acute bony abnormalities are present. Small apical blebs visualized on prior CT, RIGHT greater than LEFT, are not well visualized on this radiograph. IMPRESSION: 15% LEFT pneumothorax. No acute bony abnormality or pulmonary opacity. Small apical blebs identified on prior CT are difficult to visualize on this radiograph. Critical Value/emergent results were called by telephone at the time of interpretation on 02/08/2020 at 2:27 pm to provider Caprice Wasko , who verbally acknowledged these results. Electronically Signed   By: Margarette Canada M.D.   On: 02/08/2020 14:30    Procedures Procedures (including critical care time)  Medications Ordered in UC Medications - No data to display  Initial Impression / Assessment and Plan / UC Course  I have reviewed the triage vital signs and the nursing notes.  Pertinent labs & imaging results that were available during my care of the patient were reviewed by me and considered in my medical decision making (see chart for details).     Spontaneous pneumothorax Correlates with exam History of smoking and blebs on prior CT scan Will send to the ER for further evaluation and management Final Clinical Impressions(s) / UC Diagnoses   Final diagnoses:  Spontaneous pneumothorax     Discharge Instructions     You have a small pneumothorax in the left lung.  You need to go to the ER for further evaluation    ED Prescriptions    None     I have reviewed the PDMP during this encounter.   Orvan July, NP 02/08/20 1444

## 2020-02-08 NOTE — ED Notes (Signed)
Patient is being discharged from the [facility] and sent to the Emergency Department via wheelchair by staff. Per Loura Halt, NP, patient is stable but in need of higher level of care due to pneumothorax. Patient is aware and verbalizes understanding of plan of care.  Vitals:   02/08/20 1303  BP: 112/78  Pulse: 93  Resp: (!) 22  Temp: 98.6 F (37 C)  SpO2: 100%

## 2020-02-08 NOTE — Discharge Instructions (Signed)
You have a small pneumothorax in the left lung.  You need to go to the ER for further evaluation

## 2020-02-08 NOTE — ED Triage Notes (Signed)
Patient sent by urgent care for pneumothorax. Patient complains of left sided chest pain when respiration. Patient alert, oriented, and in no apparent distress at this time.
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
## 2020-02-09 ENCOUNTER — Ambulatory Visit: Payer: Self-pay | Admitting: Physical Therapy

## 2020-02-09 ENCOUNTER — Observation Stay (HOSPITAL_COMMUNITY): Payer: Self-pay

## 2020-02-09 DIAGNOSIS — J939 Pneumothorax, unspecified: Secondary | ICD-10-CM

## 2020-02-09 DIAGNOSIS — J9383 Other pneumothorax: Secondary | ICD-10-CM

## 2020-02-09 LAB — BASIC METABOLIC PANEL
Anion gap: 9 (ref 5–15)
BUN: 14 mg/dL (ref 6–20)
CO2: 25 mmol/L (ref 22–32)
Calcium: 9.1 mg/dL (ref 8.9–10.3)
Chloride: 101 mmol/L (ref 98–111)
Creatinine, Ser: 0.9 mg/dL (ref 0.44–1.00)
GFR calc Af Amer: >60 mL/min (ref >60)
GFR calc non Af Amer: >60 mL/min (ref >60)
Glucose, Bld: 94 mg/dL (ref 70–99)
Potassium: 4.1 mmol/L (ref 3.5–5.1)
Sodium: 135 mmol/L (ref 135–145)

## 2020-02-09 LAB — CBC
HCT: 42.6 % (ref 36.0–46.0)
Hemoglobin: 14 g/dL (ref 12.0–15.0)
MCH: 30.8 pg (ref 26.0–34.0)
MCHC: 32.9 g/dL (ref 30.0–36.0)
MCV: 93.6 fL (ref 80.0–100.0)
Platelets: 245 10*3/uL (ref 150–400)
RBC: 4.55 MIL/uL (ref 3.87–5.11)
RDW: 12.6 % (ref 11.5–15.5)
WBC: 5.6 10*3/uL (ref 4.0–10.5)
nRBC: 0 % (ref 0.0–0.2)

## 2020-02-09 LAB — MRSA PCR SCREENING: MRSA by PCR: NEGATIVE

## 2020-02-09 LAB — HIV ANTIBODY (ROUTINE TESTING W REFLEX): HIV Screen 4th Generation wRfx: NONREACTIVE

## 2020-02-09 MED ORDER — KETOROLAC TROMETHAMINE 15 MG/ML IJ SOLN
15.0000 mg | Freq: Once | INTRAMUSCULAR | Status: AC
  Administered 2020-02-09: 15 mg via INTRAVENOUS
  Filled 2020-02-09: qty 1

## 2020-02-09 MED FILL — CYCLOBENZAPRINE 10 MG TAB: 10 | 10 days supply | Qty: 30 | Fill #1

## 2020-02-09 MED FILL — METFORMIN HCL 500 MG TABS: 500 | 30 days supply | Qty: 60 | Fill #1
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
## 2020-02-09 NOTE — Evaluation (Signed)
Physical Therapy Evaluation Patient Details Name: Samantha [name] MRN: [id number] DOB: [birth year] Today's Date: 02/09/2020   History of Present Illness  44 yo admitted with spontaneous PTX. PMhx: smoker, asthma, back pain, HLD, hydradenitis  Clinical Impression  Pt pleasant and report pain with breathing with and without supplemental O2. Pt on RA throughout session and able to maintains sats 90-97%. Pt with decreased activity tolerance and cardiopulmonary status who will benefit from acute therapy to maximize mobility. Pt lives in 2 story apartment and will need to be able to manage flight of stairs prior to DC but was not ready to attempt this session. Encouraged walking and mobility with nursing staff.   HR 120 with gait    Follow Up Recommendations No PT follow up    Equipment Recommendations  None recommended by PT    Recommendations for Other Services       Precautions / Restrictions        Mobility  Bed Mobility Overal bed mobility: Modified Independent                Transfers Overall transfer level: Needs assistance   Transfers: Sit to/from Stand Sit to Stand: Supervision         General transfer comment: supervision for lines  Ambulation/Gait Ambulation/Gait assistance: Supervision Gait Distance (Feet): 400 Feet Assistive device: None Gait Pattern/deviations: Step-through pattern;Decreased stride length   Gait velocity interpretation: >2.62 ft/sec, indicative of community ambulatory General Gait Details: pt with slow cautious gait with report of pain with breathing but able to maintain SpO2 90-97% on RA throughout  Stairs            Wheelchair Mobility    Modified Rankin (Stroke Patients Only)       Balance Overall balance assessment: No apparent balance deficits (not formally assessed)                                           Pertinent Vitals/Pain Pain Assessment: 0-10 Pain Score: 8  Pain Location: chest Pain  Descriptors / Indicators: Aching Pain Intervention(s): Limited activity within patient's tolerance;Monitored during session;Repositioned    Home Living Family/patient expects to be discharged to:: Private residence Living Arrangements: Alone Available Help at Discharge: Family;Available 24 hours/day Type of Home: Apartment Home Access: Stairs to enter   Entrance Stairs-Number of Steps: 2 Home Layout: Two level Home Equipment: None      Prior Function Level of Independence: Independent         Comments: doesn't work, chronic neck and LUE pain, new puppy she enjoys playing with     Hand Dominance        Extremity/Trunk Assessment   Upper Extremity Assessment Upper Extremity Assessment: Overall WFL for tasks assessed    Lower Extremity Assessment Lower Extremity Assessment: Overall WFL for tasks assessed    Cervical / Trunk Assessment Cervical / Trunk Assessment: (pt guarding with neck due to chronic neck pain)  Communication   Communication: No difficulties  Cognition Arousal/Alertness: Awake/alert Behavior During Therapy: WFL for tasks assessed/performed Overall Cognitive Status: Within Functional Limits for tasks assessed                                        General Comments      Exercises     Assessment/Plan  PT Assessment Patient needs continued PT services  PT Problem List Decreased activity tolerance;Pain;Cardiopulmonary status limiting activity       PT Treatment Interventions Gait training;Stair training;Patient/family education;Therapeutic activities    PT Goals (Current goals can be found in the Care Plan section)  Acute Rehab PT Goals Patient Stated Goal: return home to play with my dog PT Goal Formulation: With patient Time For Goal Achievement: 02/16/20 Potential to Achieve Goals: Good    Frequency Min 3X/week   Barriers to discharge Decreased caregiver support      Co-evaluation               AM-PAC  PT "6 Clicks" Mobility  Outcome Measure Help needed turning from your back to your side while in a flat bed without using bedrails?: None Help needed moving from lying on your back to sitting on the side of a flat bed without using bedrails?: None Help needed moving to and from a bed to a chair (including a wheelchair)?: None Help needed standing up from a chair using your arms (e.g., wheelchair or bedside chair)?: None Help needed to walk in hospital room?: A Little Help needed climbing 3-5 steps with a railing? : A Little 6 Click Score: 22    End of Session   Activity Tolerance: Patient tolerated treatment well Patient left: in chair;with call bell/phone within reach Nurse Communication: Mobility status PT Visit Diagnosis: Other abnormalities of gait and mobility (R26.89)    Time: [id number] PT Time Calculation (min) (ACUTE ONLY): 22 min   Charges:   PT Evaluation $PT Eval Moderate Complexity: 1 Mod          Yunis Voorheis P, PT Acute Rehabilitation Services Pager: [phone] Office: [place] 02/09/2020, 9:36 AM

## 2020-02-09 NOTE — Progress Notes (Signed)
Pulse ox-99% on room air. Started on o2 10L [place] as ordered as treatment for pneumothorax, continue to monitor.
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
## 2020-02-09 NOTE — Evaluation (Signed)
Occupational Therapy Evaluation Patient Details Name: Samantha [name] MRN: [id number] DOB: Jan 02, 1976 Today's Date: 02/09/2020    History of Present Illness 44 yo admitted with spontaneous PTX. PMhx: smoker, asthma, back pain, HLD, hydradenitis   Clinical Impression   Pt PTA: Pt living at home alone, does not work and has chronic pain. Pt currently Independent with ADL routine and supervisionA for lines for mobility and standing. No physical assist required. Pt with no LOB episodes. Pt performing ADL routine and figure 4 techniuqe at EOB to reduce pain in LB. VSS on RA. Pt increased HR with mobility from 105 BPM to 136 BPM with exertion.RN aware. No further OT skilled services required. OT signing off.    Follow Up Recommendations  No OT follow up    Equipment Recommendations  None recommended by OT    Recommendations for Other Services       Precautions / Restrictions Precautions Precautions: None Restrictions Weight Bearing Restrictions: No      Mobility Bed Mobility Overal bed mobility: Modified Independent                Transfers Overall transfer level: Needs assistance   Transfers: Sit to/from Stand Sit to Stand: Supervision         General transfer comment: supervision for lines    Balance Overall balance assessment: No apparent balance deficits (not formally assessed)                                         ADL either performed or assessed with clinical judgement   ADL Overall ADL's : Modified independent                                       General ADL Comments: No physical assist required. Pt with no LOB episodes. Pt performing ADL routine and figure 4 techniuqe at EOB to reduce pain in LB.     Vision Baseline Vision/History: No visual deficits Patient Visual Report: No change from baseline Vision Assessment?: No apparent visual deficits     Perception     Praxis      Pertinent Vitals/Pain Pain Assessment:  0-10 Pain Score: 6  Pain Location: chest Pain Descriptors / Indicators: Aching Pain Intervention(s): Monitored during session     Hand Dominance Right   Extremity/Trunk Assessment Upper Extremity Assessment Upper Extremity Assessment: Overall WFL for tasks assessed   Lower Extremity Assessment Lower Extremity Assessment: Overall WFL for tasks assessed   Cervical / Trunk Assessment Cervical / Trunk Assessment: Normal   Communication Communication Communication: No difficulties   Cognition Arousal/Alertness: Awake/alert Behavior During Therapy: WFL for tasks assessed/performed Overall Cognitive Status: Within Functional Limits for tasks assessed                                     General Comments  VSS on RA. Pt increased HR with mobility from 105 BPM to 136 BPM with exertion.    Exercises     Shoulder Instructions      Home Living Family/patient expects to be discharged to:: Private residence Living Arrangements: Alone Available Help at Discharge: Family;Available 24 hours/day Type of Home: Apartment Home Access: Stairs to enter Entrance Stairs-Number of Steps: 2   Home Layout:  Two level Alternate Level Stairs-Number of Steps: 14   Bathroom Shower/Tub: Teacher, early years/pre: Standard     Home Equipment: None          Prior Functioning/Environment Level of Independence: Independent        Comments: doesn't work, chronic neck and LUE pain, new puppy she enjoys playing with        OT Problem List: Decreased activity tolerance      OT Treatment/Interventions:      OT Goals(Current goals can be found in the care plan section) Acute Rehab OT Goals Patient Stated Goal: return home to play with my dog OT Goal Formulation: All assessment and education complete, DC therapy  OT Frequency:     Barriers to D/C:            Co-evaluation              AM-PAC OT "6 Clicks" Daily Activity     Outcome Measure Help from  another person eating meals?: None Help from another person taking care of personal grooming?: None Help from another person toileting, which includes using toliet, bedpan, or urinal?: None Help from another person bathing (including washing, rinsing, drying)?: None Help from another person to put on and taking off regular upper body clothing?: None Help from another person to put on and taking off regular lower body clothing?: None 6 Click Score: 24   End of Session Nurse Communication: Mobility status  Activity Tolerance: Patient tolerated treatment well Patient left: in bed;with call bell/phone within reach  OT Visit Diagnosis: Unsteadiness on feet (R26.81)                Time: 1020-1035 OT Time Calculation (min): 15 min Charges:  OT General Charges $OT Visit: 1 Visit OT Evaluation $OT Eval Moderate Complexity: 1 Mod  Jefferey Pica, OTR/L Acute Rehabilitation Services Pager: [phone] Office: [phone]   Tarell Schollmeyer C 02/09/2020, 3:35 PM

## 2020-02-09 NOTE — Progress Notes (Signed)
Unable to collect urine for drug screen, pt has her monthly period, urine mixed with blood. MD aware.

## 2020-02-09 NOTE — Progress Notes (Signed)
Heart 130's on ambulation with PT. No complaints presented. Continue to monitor

## 2020-02-09 NOTE — Hospital Course (Signed)
44 Y/O F with PMX of TIA, HLD, Asthma, Smoker, presents with left-sided chest pain with inspiration and movement. She denies cough, SOB, or fever. Pain improved just a bit this morning.   Spontaneous pneumothorax, left-sided: Patient presented with chest pain and dyspnea.  Found to have a spontaneous pneumothorax on left side after chest x-ray.  CTA was performed to rule out pneumothorax which was negative for such.  Initially started on 2 L nasal cannula. CVTS was consulted and recommended follow-up to review chest x-ray which showed a 10 mm left-sided pneumothorax.  CVTS rec mended check in 1 final two-view x-ray on 6/2 to ensure that the pneumothorax was not growing.  Patient was also started on 10 L nasal cannula for treatment purposes, however patient did not have any desats during this hospitalization.

## 2020-02-10 ENCOUNTER — Inpatient Hospital Stay (HOSPITAL_COMMUNITY): Payer: Self-pay

## 2020-02-10 MED ORDER — ACETAMINOPHEN 325 MG PO TABS: 650.0000 mg | ORAL_TABLET | Freq: Four times a day (QID) | ORAL | 0 refills | Status: DC | PRN

## 2020-02-10 NOTE — Discharge Summary (Signed)
[facility] Discharge Summary  Patient name: Samantha [name] Medical record number: [id number] Date of birth: October 05, 1975 Age: 44 y.o. Gender: female Date of Admission: 02/08/2020  Date of Discharge: 02/10/2020 Admitting Physician: Kinnie Feil, MD  Primary Care Provider: Scot Jun, FNP Consultants: CVTS  Indication for Hospitalization: Shortness of breath, chest pain  Discharge Diagnoses/Problem List:  Active Problems:   Pneumothorax  Disposition: Home  Discharge Condition: Stable  Discharge Exam:   General: Alert and oriented in no apparent distress Heart: Regular rate and rhythm with no murmurs appreciated Lungs: Some wheezing throughout but no respiratory distress Extremities: No lower extremity edema  Brief Hospital Course:  44 Y/O F with PMX of TIA, HLD, Asthma, Smoker, presents with left-sided chest pain with inspiration and movement. She denies cough, SOB, or fever. Pain improved just a bit this morning.   Spontaneous pneumothorax, left-sided: Patient presented with chest pain and dyspnea.  Found to have a spontaneous pneumothorax on left side after chest x-ray.  CTA was performed to rule out pneumothorax which was negative for such.  Initially started on 2 L nasal cannula. CVTS was consulted and recommended follow-up to review chest x-ray which showed a 10 mm left-sided pneumothorax.  CVTS recommended check in 1 final two-view x-ray on 6/2 to ensure that the pneumothorax was not growing.  Patient was also started on 10 L nasal cannula for treatment purposes which was reduced to 6 L, however patient did not have any desats during this hospitalization.  CVTS recommended repeating the two-view chest x-ray on 6/2 to ensure that the pneumothorax was not growing.  Once this was done it was shown that the pneumothorax had resolved.  CVTS signed off with recommendation that the patient not fly for at least 2 weeks and schedule outpatient pulmonology  follow-up.  Issues for Follow Up:  1. Recommend outpatient pulmonology follow-up which was ordered at time of discharge 2. Recommend patient not fly for 2 weeks 3. Recommend patient quit smoking  Significant Procedures: Procedures none  Significant Labs and Imaging:  Recent Labs  Lab 02/08/20 1806 02/09/20 0633  WBC 6.6 5.6  HGB 14.4 14.0  HCT 43.3 42.6  PLT 245 245   Recent Labs  Lab 02/08/20 1806 02/09/20 0633  NA 136 135  K 4.5 4.1  CL 102 101  CO2 22 25  GLUCOSE 95 94  BUN 10 14  CREATININE 0.81 0.90  CALCIUM 9.4 9.1     Results/Tests Pending at Time of Discharge:   Discharge Medications:  Allergies as of 02/10/2020   No Known Allergies     Medication List    TAKE these medications   acetaminophen 325 MG tablet Commonly known as: TYLENOL Take 2 tablets (650 mg total) by mouth every 6 (six) hours as needed for mild pain (or Fever >/= 101).   escitalopram 10 MG tablet Commonly known as: LEXAPRO Take 1 tablet (10 mg total) by mouth at bedtime.   gabapentin 300 MG capsule Commonly known as: NEURONTIN Take 1 capsule (300 mg total) by mouth 3 (three) times daily as needed. What changed: reasons to take this   metFORMIN 500 MG tablet Commonly known as: GLUCOPHAGE Take 1 tablet (500 mg total) by mouth 2 (two) times daily with a meal.       Discharge Instructions: Please refer to Patient Instructions section of EMR for full details.  Patient was counseled important signs and symptoms that should prompt return to medical care, changes in medications, dietary  instructions, activity restrictions, and follow up appointments.   Follow-Up Appointments: Follow-up Information    Grace Isaac, MD Follow up.   Specialty: Cardiothoracic Surgery Why: He has an appointment to see Dr. Servando Snare on 02/25/2020 at 1:15 PM.  Please obtain a chest x-ray at [facility] at 12:45 PM.  It is located in the same office complex on the first floor. Contact  information: [address] [place] [address] [place], [place], DO 02/10/2020, 12:32 PM PGY-1, [place]

## 2020-02-10 NOTE — Plan of Care (Signed)

## 2020-02-10 NOTE — Progress Notes (Addendum)
      [address]       [place] [address]             [phone]         Subjective: Feels okay this morning. No new complaints or pain.   Objective: Vital signs in last 24 hours: Temp:  [98 F (36.7 C)-98.3 F (36.8 C)] 98.3 F (36.8 C) (06/02 0718) Pulse Rate:  [63-85] 85 (06/02 0718) Cardiac Rhythm: Normal sinus rhythm (06/02 0744) Resp:  [12-16] 12 (06/02 0718) BP: (94-108)/(63-79) 103/63 (06/02 0718) SpO2:  [100 %] 100 % (06/02 0718)     Intake/Output from previous day: 06/01 0701 - 06/02 0700 In: 1060 [P.O.:1060] Out: -  Intake/Output this shift: No intake/output data recorded.  General appearance: alert, cooperative and no distress Heart: regular rate and rhythm, S1, S2 normal, no murmur, click, rub or gallop Lungs: clear to auscultation bilaterally Abdomen: soft, non-tender; bowel sounds normal; no masses,  no organomegaly Extremities: extremities normal, atraumatic, no cyanosis or edema Wound: N/A  Lab Results: Recent Labs    02/08/20 1806 02/09/20 0633  WBC 6.6 5.6  HGB 14.4 14.0  HCT 43.3 42.6  PLT 245 245   BMET:  Recent Labs    02/08/20 1806 02/09/20 0633  NA 136 135  K 4.5 4.1  CL 102 101  CO2 22 25  GLUCOSE 95 94  BUN 10 14  CREATININE 0.81 0.90  CALCIUM 9.4 9.1    PT/INR: No results for input(s): LABPROT, INR in the last 72 hours. ABG    Component Value Date/Time   TCO2 25 06/04/2018 1611   CBG (last 3)  Recent Labs    02/08/20 2304  GLUCAP 121*    Assessment/Plan: S/P left sided spontaneous pneumo yesterday on CXR  1. This morning's CXR showed no residual pneumo on the left. She is encouraged to stop smoking. No intervention was needed from our team.   Plan: Weaning off supplemental oxygen, encouraged to ambulate several times a day, reviewed smoking cessation. Discharge is up to attending but no intervention needed at this time.      LOS: 1 day    Elgie Collard 02/10/2020  Chest xray  reviewed No PTX I have seen and examined Ihor Austin and agree with the above assessment  and plan.  Grace Isaac MD Beeper [phone] Office [phone] 02/10/2020 9:05 AM

## 2020-02-10 NOTE — Progress Notes (Signed)
Physical Therapy Treatment and Discharge Patient Details Name: Samantha [name] MRN: [id number] DOB: Jan 22, 1976 Today's Date: 02/10/2020    History of Present Illness 44 yo admitted with spontaneous PTX on 02/08/20.  PMhx: smoker, asthma, back pain, HLD, hydradenitis    PT Comments    Pt able to complete transfers and bed mobility mod(I) for increased time. Pt amb >441f without assistive device and negotiate one flight of steps mod(I). Pt remained 98-100% Spo2 RA and had sporadic bouts of vtach during amb, nursing aware. Pt HR remained stable during stair negotiation with minimal rise. Pt ready to d/c from physical therapy acutely from mobility stand point.      Follow Up Recommendations  No PT follow up     Equipment Recommendations  None recommended by PT    Recommendations for Other Services       Precautions / Restrictions Precautions Precautions: None Restrictions Weight Bearing Restrictions: No    Mobility  Bed Mobility Overal bed mobility: Modified Independent             General bed mobility comments: HOB elevated  Transfers Overall transfer level: Modified independent Equipment used: None Transfers: Sit to/from Stand Sit to Stand: Modified independent (Device/Increase time)         General transfer comment: Pt required increased tome to sit to/from stand  Ambulation/Gait Ambulation/Gait assistance: Modified independent (Device/Increase time) Gait Distance (Feet): 450 Feet Assistive device: None Gait Pattern/deviations: Step-through pattern;Decreased stride length     General Gait Details: pt with slow cautious gait with report of pain with breathing but able to maintain SpO2 98-100% on RA throughout. EKG read V-tach sporadically throughout session, pt reported occasionally feeling her heart race, no increase in HR with stair negotiation, alerted nurse of HR changes throughout session.   Stairs Stairs: Yes Stairs assistance: Modified independent  (Device/Increase time) Stair Management: One rail Right;One rail Left Number of Stairs: 11 General stair comments: Pt negotiated 1 flight of stairs with unilateral UE support on rail.   Wheelchair Mobility    Modified Rankin (Stroke Patients Only)       Balance Overall balance assessment: No apparent balance deficits (not formally assessed)                                          Cognition   Behavior During Therapy: WFL for tasks assessed/performed Overall Cognitive Status: Within Functional Limits for tasks assessed                                        Exercises      General Comments General comments (skin integrity, edema, etc.): Spo2 stabe throughout session on RA, HR sporadic vtach, nursing aware.      Pertinent Vitals/Pain Pain Assessment: No/denies pain    Home Living                      Prior Function            PT Goals (current goals can now be found in the care plan section) Acute Rehab PT Goals PT Goal Formulation: All assessment and education complete, DC therapy Progress towards PT goals: Goals met/education completed, patient discharged from PT    Frequency    Min 3X/week      PT Plan Current  plan remains appropriate    Co-evaluation              AM-PAC PT "6 Clicks" Mobility   Outcome Measure  Help needed turning from your back to your side while in a flat bed without using bedrails?: None Help needed moving from lying on your back to sitting on the side of a flat bed without using bedrails?: None Help needed moving to and from a bed to a chair (including a wheelchair)?: None Help needed standing up from a chair using your arms (e.g., wheelchair or bedside chair)?: None Help needed to walk in hospital room?: None Help needed climbing 3-5 steps with a railing? : None 6 Click Score: 24    End of Session Equipment Utilized During Treatment: Gait belt Activity Tolerance: Patient  tolerated treatment well Patient left: in chair;with call bell/phone within reach Nurse Communication: Mobility status;Other (comment)(vital signs) PT Visit Diagnosis: Other abnormalities of gait and mobility (R26.89)     Time: 0044-7158 PT Time Calculation (min) (ACUTE ONLY): 18 min  Charges:  $Gait Training: 8-22 mins                     [employer] SPT 02/10/2020    Rolland Porter 02/10/2020, 12:41 PM

## 2020-02-10 NOTE — Plan of Care (Signed)
  Problem: Education: Goal: Knowledge of General Education information will improve Description: Including pain rating scale, medication(s)/side effects and non-pharmacologic comfort measures 02/10/2020 1230 by Shanon Ace, RN Outcome: Adequate for Discharge 02/10/2020 1230 by Shanon Ace, RN Outcome: Progressing 02/10/2020 0910 by Shanon Ace, RN Outcome: Progressing   Problem: Health Behavior/Discharge Planning: Goal: Ability to manage health-related needs will improve 02/10/2020 1230 by Shanon Ace, RN Outcome: Adequate for Discharge 02/10/2020 1230 by Shanon Ace, RN Outcome: Progressing 02/10/2020 0910 by Shanon Ace, RN Outcome: Progressing   Problem: Clinical Measurements: Goal: Ability to maintain clinical measurements within normal limits will improve 02/10/2020 1230 by Shanon Ace, RN Outcome: Adequate for Discharge 02/10/2020 1230 by Shanon Ace, RN Outcome: Progressing 02/10/2020 0910 by Shanon Ace, RN Outcome: Progressing Goal: Will remain free from infection 02/10/2020 1230 by Shanon Ace, RN Outcome: Adequate for Discharge 02/10/2020 1230 by Shanon Ace, RN Outcome: Progressing 02/10/2020 0910 by Shanon Ace, RN Outcome: Progressing Goal: Diagnostic test results will improve 02/10/2020 1230 by Shanon Ace, RN Outcome: Adequate for Discharge 02/10/2020 1230 by Shanon Ace, RN Outcome: Progressing 02/10/2020 0910 by Shanon Ace, RN Outcome: Progressing Goal: Respiratory complications will improve 02/10/2020 1230 by Shanon Ace, RN Outcome: Adequate for Discharge 02/10/2020 1230 by Shanon Ace, RN Outcome: Progressing 02/10/2020 0910 by Shanon Ace, RN Outcome: Progressing Goal: Cardiovascular complication will be avoided 02/10/2020 1230 by Shanon Ace, RN Outcome: Adequate for Discharge 02/10/2020 1230 by Shanon Ace, RN Outcome: Progressing 02/10/2020 0910 by Shanon Ace, RN Outcome: Progressing   Problem: Activity: Goal: Risk for activity  intolerance will decrease 02/10/2020 1230 by Shanon Ace, RN Outcome: Adequate for Discharge 02/10/2020 1230 by Shanon Ace, RN Outcome: Progressing 02/10/2020 0910 by Shanon Ace, RN Outcome: Progressing   Problem: Nutrition: Goal: Adequate nutrition will be maintained 02/10/2020 1230 by Shanon Ace, RN Outcome: Adequate for Discharge 02/10/2020 1230 by Shanon Ace, RN Outcome: Progressing 02/10/2020 0910 by Shanon Ace, RN Outcome: Progressing   Problem: Coping: Goal: Level of anxiety will decrease 02/10/2020 1230 by Shanon Ace, RN Outcome: Adequate for Discharge 02/10/2020 1230 by Shanon Ace, RN Outcome: Progressing 02/10/2020 0910 by Shanon Ace, RN Outcome: Progressing   Problem: Elimination: Goal: Will not experience complications related to bowel motility 02/10/2020 1230 by Shanon Ace, RN Outcome: Adequate for Discharge 02/10/2020 1230 by Shanon Ace, RN Outcome: Progressing 02/10/2020 0910 by Shanon Ace, RN Outcome: Progressing Goal: Will not experience complications related to urinary retention 02/10/2020 1230 by Shanon Ace, RN Outcome: Adequate for Discharge 02/10/2020 1230 by Shanon Ace, RN Outcome: Progressing 02/10/2020 0910 by Shanon Ace, RN Outcome: Progressing   Problem: Pain Managment: Goal: General experience of comfort will improve 02/10/2020 1230 by Shanon Ace, RN Outcome: Adequate for Discharge 02/10/2020 1230 by Shanon Ace, RN Outcome: Progressing 02/10/2020 0910 by Shanon Ace, RN Outcome: Progressing   Problem: Safety: Goal: Ability to remain free from injury will improve 02/10/2020 1230 by Shanon Ace, RN Outcome: Adequate for Discharge 02/10/2020 1230 by Shanon Ace, RN Outcome: Progressing 02/10/2020 0910 by Shanon Ace, RN Outcome: Progressing   Problem: Skin Integrity: Goal: Risk for impaired skin integrity will decrease 02/10/2020 1230 by Shanon Ace, RN Outcome: Adequate for Discharge 02/10/2020 1230 by Shanon Ace, RN Outcome: Progressing 02/10/2020 0910 by Shanon Ace, RN Outcome: Progressing

## 2020-02-10 NOTE — Progress Notes (Signed)
The patient denies any concerns today. Pain improved a lot with no respiratory distress.  Her lungs are CTA. Otherwise, no abnormal findings on her exam.  Xray: Resolved pneumothorax.  A/P:  Spontaneous pneumothorax: Resolved s/p HF O2, 6 L [place]. Stable for d/c home. I contacted Dr. Servando Snare to ensure no further inpatient management requirement. He was in surgery and communicated with me via his Nurse, adult. He recommended outpatient pulmonology f/u and no air flight for at least two weeks. The patient is otherwise stable for home d/c.

## 2020-02-10 NOTE — Discharge Instructions (Signed)
You are still recovering from your left lung Pneumothorax (partial collapse of lung). Here are some instructions to encourage good healing and to prevent another one from happening in the future. 1. We highly encourage you to STOP SMOKING! 2. Avoid flying in the next 2 weeks. Air travel can cause the pneumothorax to worsen or return. 3. Be sure to ambulate (walk around) several times daily as this will help prevent another pneumothorax.  Please follow up with Pulmonology within the next couple of weeks! We have put in a referral for you to see them.   Milus Banister, [place], PGY-2 02/10/2020 10:46 AM   Pneumothorax A pneumothorax is commonly called a collapsed lung. It is a condition in which air leaks from a lung and builds up between the thin layer of tissue that covers the lungs (visceral pleura) and the interior wall of the chest cavity (parietal pleura). The air gets trapped outside the lung, between the lung and the chest wall (pleural space). The air takes up space and prevents the lung from fully expanding. This condition sometimes occurs suddenly with no apparent cause. The buildup of air may be small or large. A small pneumothorax may go away on its own. A large pneumothorax will require treatment and hospitalization. What are the causes? This condition may be caused by:  Trauma and injury to the chest wall.  Surgery and other medical procedures.  A complication of an underlying lung problem, especially chronic obstructive pulmonary disease (COPD) or emphysema. Sometimes the cause of this condition is not known. What increases the risk? You are more likely to develop this condition if:  You have an underlying lung problem.  You smoke.  You are 65-12 years old, female, tall, and underweight.  You have a personal or family history of pneumothorax.  You have an eating disorder (anorexia nervosa). This condition can also happen quickly, even in people  with no history of lung problems. What are the signs or symptoms? Sometimes a pneumothorax will have no symptoms. When symptoms are present, they can include:  Chest pain.  Shortness of breath.  Increased rate of breathing.  Bluish color to your lips or skin (cyanosis). How is this diagnosed? This condition may be diagnosed by:  A medical history and physical exam.  A chest X-ray, chest CT scan, or ultrasound. How is this treated? Treatment depends on how severe your condition is. The goal of treatment is to remove the extra air and allow your lung to expand back to its normal size.  For a small pneumothorax: ? No treatment may be needed. ? Extra oxygen is sometimes used to make it go away more quickly.  For a large pneumothorax or a pneumothorax that is causing symptoms, a procedure is done to drain the air from your lungs. To do this, a health care provider may use: ? A needle with a syringe. This is used to suck air from a pleural space where no additional leakage is taking place. ? A chest tube. This is used to suck air where there is ongoing leakage into the pleural space. The chest tube may need to remain in place for several days until the air leak has healed.  In more severe cases, surgery may be needed to repair the damage that is causing the leak.  If you have multiple pneumothorax episodes or have an air leak that will not heal, a procedure called a pleurodesis may be done. A medicine is placed in the  pleural space to irritate the tissues around the lung so that the lung will stick to the chest wall, seal any leaks, and stop any buildup of air in that space. If you have an underlying lung problem, severe symptoms, or a large pneumothorax you will usually need to stay in the hospital. Follow these instructions at home: Lifestyle  Do not use any products that contain nicotine or tobacco, such as cigarettes and e-cigarettes. These are major risk factors in pneumothorax. If  you need help quitting, ask your health care provider.  Do not lift anything that is heavier than 10 lb (4.5 kg), or the limit that your health care provider tells you, until he or she says that it is safe.  Avoid activities that take a lot of effort (strenuous) for as long as told by your health care provider.  Return to your normal activities as told by your health care provider. Ask your health care provider what activities are safe for you.  Do not fly in an airplane or scuba dive until your health care provider says it is okay. General instructions  Take over-the-counter and prescription medicines only as told by your health care provider.  If a cough or pain makes it difficult for you to sleep at night, try sleeping in a semi-upright position in a recliner or by using 2 or 3 pillows.  If you had a chest tube and it was removed, ask your health care provider when you can remove the bandage (dressing). While the dressing is in place, do not allow it to get wet.  Keep all follow-up visits as told by your health care provider. This is important. Contact a health care provider if:  You cough up thick mucus (sputum) that is yellow or green in color.  You were treated with a chest tube, and you have redness, increasing pain, or discharge at the site where it was placed. Get help right away if:  You have increasing chest pain or shortness of breath.  You have a cough that will not go away.  You begin coughing up blood.  You have pain that is getting worse or is not controlled with medicines.  The site where your chest tube was located opens up.  You feel air coming out of the site where the chest tube was placed.  You have a fever or persistent symptoms for more than 2-3 days.  You have a fever and your symptoms suddenly get worse. These symptoms may represent a serious problem that is an emergency. Do not wait to see if the symptoms will go away. Get medical help right away.  Call your local emergency services (911 in the [place] Do not drive yourself to the hospital. Summary  A pneumothorax, commonly called a collapsed lung, is a condition in which air leaks from a lung and gets trapped between the lung and the chest wall (pleural space).  The buildup of air may be small or large. A small pneumothorax may go away on its own. A large pneumothorax will require treatment and hospitalization.  Treatment for this condition depends on how severe the pneumothorax is. The goal of treatment is to remove the extra air and allow the lung to expand back to its normal size. This information is not intended to replace advice given to you by your health care provider. Make sure you discuss any questions you have with your health care provider. Document Revised: 08/09/2017 Document Reviewed: 08/05/2017 Elsevier Patient Education  2020 [employer]  Inc[employer]  

## 2020-02-11 NOTE — Telephone Encounter (Signed)
error 

## 2020-02-17 ENCOUNTER — Encounter: Payer: Self-pay | Admitting: Physical Medicine and Rehabilitation

## 2020-02-17 ENCOUNTER — Ambulatory Visit: Payer: Self-pay

## 2020-02-17 ENCOUNTER — Other Ambulatory Visit: Payer: Self-pay

## 2020-02-17 ENCOUNTER — Ambulatory Visit (INDEPENDENT_AMBULATORY_CARE_PROVIDER_SITE_OTHER): Payer: No Typology Code available for payment source | Admitting: Physical Medicine and Rehabilitation

## 2020-02-17 VITALS — BP 97/70 | HR 86

## 2020-02-17 DIAGNOSIS — M5412 Radiculopathy, cervical region: Secondary | ICD-10-CM

## 2020-02-17 MED ORDER — METHYLPREDNISOLONE ACETATE 80 MG/ML IJ SUSP
40.0000 mg | Freq: Once | INTRAMUSCULAR | Status: AC
  Administered 2020-02-17: 40 mg

## 2020-02-17 NOTE — Progress Notes (Signed)
Pt states pain in the back of the neck and left shoulder. Pt states pain started years ago and has gotten worse in the past few months. Pain is there all the time. Elevating her head helps with pain.   .Numeric Pain Rating Scale and Functional Assessment Average Pain 10   In the last MONTH (on 0-10 scale) has pain interfered with the following?  1. General activity like being  able to carry out your everyday physical activities such as walking, climbing stairs, carrying groceries, or moving a chair?  Rating(10)   +Driver, -BT, -Dye Allergies.

## 2020-02-23 ENCOUNTER — Telehealth: Payer: Self-pay | Admitting: Physical Medicine and Rehabilitation

## 2020-02-23 ENCOUNTER — Other Ambulatory Visit: Payer: Self-pay | Admitting: Physical Medicine and Rehabilitation

## 2020-02-23 DIAGNOSIS — M542 Cervicalgia: Secondary | ICD-10-CM

## 2020-02-23 MED ORDER — METHOCARBAMOL 500 MG PO TABS: 500.0000 mg | ORAL_TABLET | Freq: Three times a day (TID) | ORAL | 0 refills | Status: DC | PRN

## 2020-02-23 MED FILL — METHOCARBAMOL 500 MG TABS: 500 | 20 days supply | Qty: 60 | Fill #0

## 2020-02-23 NOTE — Telephone Encounter (Signed)
Patient called stating that she is hurting a lot today and wanted to know what Dr. Ernestina Patches suggest she take for the pain.  CB#[phone].  Thank you.

## 2020-02-23 NOTE — Telephone Encounter (Signed)
Please advise. Scheduled for C7-T1 [place] 6/30.

## 2020-02-23 NOTE — Telephone Encounter (Signed)
Put in a prescription for Robaxin/methocarbamol.  She can take that as needed for pain she should also be taking 2 Tylenol 3 times a day scheduled and she should continue to take the gabapentin.  If the injection we just performed has not been helping at all at this point injection may not be beneficial.  She is scheduled to see [place] again for second injection.

## 2020-02-24 ENCOUNTER — Other Ambulatory Visit: Payer: Self-pay | Admitting: Cardiothoracic Surgery

## 2020-02-24 DIAGNOSIS — J939 Pneumothorax, unspecified: Secondary | ICD-10-CM

## 2020-02-24 NOTE — Telephone Encounter (Signed)
Called pt and advised per Dr. Ernestina Patches, pt understood.

## 2020-02-25 ENCOUNTER — Ambulatory Visit: Payer: Self-pay | Admitting: Cardiothoracic Surgery

## 2020-02-25 NOTE — Procedures (Signed)
Cervical Epidural Steroid Injection - Interlaminar Approach with Fluoroscopic Guidance  Patient: Samantha [name]      Date of Birth: [birth year] MRN: [id number] PCP: Scot Jun, FNP      Visit Date: 02/17/2020   Universal Protocol:    Date/Time: 06/17/216:23 AM  Consent Given By: the patient  Position: PRONE  Additional Comments: Vital signs were monitored before and after the procedure. Patient was prepped and draped in the usual sterile fashion. The correct patient, procedure, and site was verified.   Injection Procedure Details:  Procedure Site One Meds Administered:  Meds ordered this encounter  Medications  . methylPREDNISolone acetate (DEPO-MEDROL) injection 40 mg     Laterality: Left  Location/Site: C7-T1  Needle size: 20 G  Needle type: Touhy  Needle Placement: Paramedian epidural space  Findings:  -Comments: Excellent flow of contrast into the epidural space.  Procedure Details: Using a paramedian approach from the side mentioned above, the region overlying the inferior lamina was localized under fluoroscopic visualization and the soft tissues overlying this structure were infiltrated with 4 ml. of 1% Lidocaine without Epinephrine. A # 20 gauge, Tuohy needle was inserted into the epidural space using a paramedian approach.  The epidural space was localized using loss of resistance along with lateral and contralateral oblique bi-planar fluoroscopic views.  After negative aspirate for air, blood, and CSF, a 2 ml. volume of Isovue-250 was injected into the epidural space and the flow of contrast was observed. Radiographs were obtained for documentation purposes.   The injectate was administered into the level noted above.  Additional Comments:  The patient tolerated the procedure well Dressing: 2 x 2 sterile gauze and Band-Aid    Post-procedure details: Patient was observed during the procedure. Post-procedure instructions were  reviewed.  Patient left the clinic in stable condition.

## 2020-02-25 NOTE — Progress Notes (Signed)
Samantha [name] - 44 y.o. female MRN [id number]  Date of birth: 1976/04/14  Office Visit Note: Visit Date: 02/17/2020 PCP: Scot Jun, FNP Referred by: Scot Jun, FNP  Subjective: Chief Complaint  Patient presents with  . Neck - Pain  . Left Shoulder - Pain   HPI:  Samantha [name] is a 44 y.o. female who comes in today at the request of Dr. Anderson Malta for planned Left C7-T1 Cervical epidural steroid injection with fluoroscopic guidance.  The patient has failed conservative care including home exercise, medications, time and activity modification.  This injection will be diagnostic and hopefully therapeutic.  Please see requesting physician notes for further details and justification.   ROS Otherwise per HPI.  Assessment & Plan: Visit Diagnoses:  1. Cervical radiculopathy     Plan: No additional findings.   Meds & Orders:  Meds ordered this encounter  Medications  . methylPREDNISolone acetate (DEPO-MEDROL) injection 40 mg    Orders Placed This Encounter  Procedures  . XR C-ARM NO REPORT  . Epidural Steroid injection    Follow-up: Return for visit to requesting physician as needed.   Procedures: No procedures performed  Cervical Epidural Steroid Injection - Interlaminar Approach with Fluoroscopic Guidance  Patient: Samantha [name]      Date of Birth: [birth year] MRN: [id number] PCP: Scot Jun, FNP      Visit Date: 02/17/2020   Universal Protocol:    Date/Time: 06/17/216:23 AM  Consent Given By: the patient  Position: PRONE  Additional Comments: Vital signs were monitored before and after the procedure. Patient was prepped and draped in the usual sterile fashion. The correct patient, procedure, and site was verified.   Injection Procedure Details:  Procedure Site One Meds Administered:  Meds ordered this encounter  Medications  . methylPREDNISolone acetate (DEPO-MEDROL) injection 40 mg     Laterality:  Left  Location/Site: C7-T1  Needle size: 20 G  Needle type: Touhy  Needle Placement: Paramedian epidural space  Findings:  -Comments: Excellent flow of contrast into the epidural space.  Procedure Details: Using a paramedian approach from the side mentioned above, the region overlying the inferior lamina was localized under fluoroscopic visualization and the soft tissues overlying this structure were infiltrated with 4 ml. of 1% Lidocaine without Epinephrine. A # 20 gauge, Tuohy needle was inserted into the epidural space using a paramedian approach.  The epidural space was localized using loss of resistance along with lateral and contralateral oblique bi-planar fluoroscopic views.  After negative aspirate for air, blood, and CSF, a 2 ml. volume of Isovue-250 was injected into the epidural space and the flow of contrast was observed. Radiographs were obtained for documentation purposes.   The injectate was administered into the level noted above.  Additional Comments:  The patient tolerated the procedure well Dressing: 2 x 2 sterile gauze and Band-Aid    Post-procedure details: Patient was observed during the procedure. Post-procedure instructions were reviewed.  Patient left the clinic in stable condition.     Clinical History: CLINICAL DATA:  Initial evaluation for chronic neck pain with extension into the back and shoulders.  EXAM: MRI CERVICAL SPINE WITHOUT CONTRAST  TECHNIQUE: Multiplanar, multisequence MR imaging of the cervical spine was performed. No intravenous contrast was administered.  COMPARISON:  Prior radiograph from 12/21/2019.  FINDINGS: Alignment: Straightening of the normal cervical lordosis. Trace retrolisthesis of C5 on C6 and C6 on C7, chronic and degenerative.  Vertebrae: Vertebral body height maintained without evidence  for acute or chronic fracture. Bone marrow signal intensity within normal limits. No discrete or worrisome osseous  lesions. Prominent discogenic reactive endplate changes present about the C5-6 and C6-7 interspaces, with associated marrow edema at C6-7. Finding could contribute to underlying neck pain. No other abnormal marrow edema.  Cord: Signal intensity within the cervical spinal cord is normal.  Posterior Fossa, vertebral arteries, paraspinal tissues: Note made of an empty sella. Visualized brain and posterior fossa otherwise unremarkable. Craniocervical junction unremarkable. Paraspinous and prevertebral soft tissues within normal limits. Normal flow voids seen within the vertebral arteries bilaterally.  Disc levels:  C2-C3: Mild endplate osseous ridging without significant disc bulge. No canal or foraminal stenosis.  C3-C4: Minimal annular disc bulge with endplate spurring. No significant canal or foraminal stenosis.  C4-C5:  Minimal annular disc bulge.  No canal or foraminal stenosis.  C5-C6: Chronic intervertebral disc space narrowing with diffuse degenerative disc osteophyte. Broad posterior component flattens and partially faces the ventral thecal sac resultant mild spinal stenosis. Mild cord flattening without cord signal changes. Moderate bilateral C6 foraminal stenosis, slightly worse on the left.  C6-C7: Chronic diffuse degenerative disc osteophyte with bilateral uncovertebral spurring, greater on the left. Broad posterior component flattens and partially faces the ventral thecal sac. Secondary mild spinal stenosis with mild cord flattening. Moderate left worse than right C7 foraminal stenosis.  C7-T1: Normal interspace. Left greater than right facet hypertrophy. No significant canal or foraminal stenosis.  Visualized upper thoracic spine demonstrates no significant finding.  IMPRESSION: 1. Degenerative disc osteophyte complexes at C5-6 and C6-7 with resultant mild canal, with moderate left worse than right C6 and C7 foraminal stenosis. 2. Prominent discogenic  reactive endplate changes about the C5-6 and C6-7 interspaces, with associated marrow edema at the C6-7 level. Finding could contribute to underlying neck pain. 3. Incidental empty sella.   Electronically Signed   By: Jeannine Boga M.D.   On: 01/16/2020 07:49     Objective:  VS:  HT:    WT:   BMI:     BP:97/70  HR:86bpm  TEMP: ( )  RESP:  Physical Exam Vitals and nursing note reviewed.  Constitutional:      General: She is not in acute distress.    Appearance: Normal appearance. She is not ill-appearing.  HENT:     Head: Normocephalic and atraumatic.     Right Ear: External ear normal.     Left Ear: External ear normal.  Eyes:     Extraocular Movements: Extraocular movements intact.  Cardiovascular:     Rate and Rhythm: Normal rate.     Pulses: Normal pulses.  Musculoskeletal:     Cervical back: Tenderness present. No rigidity.     Right lower leg: No edema.     Left lower leg: No edema.     Comments: Patient has good strength in the upper extremities including 5 out of 5 strength in wrist extension long finger flexion and APB.  There is no atrophy of the hands intrinsically.  There is a negative Hoffmann's test.   Lymphadenopathy:     Cervical: No cervical adenopathy.  Skin:    Findings: No erythema, lesion or rash.  Neurological:     General: No focal deficit present.     Mental Status: She is alert and oriented to person, place, and time.     Sensory: No sensory deficit.     Motor: No weakness or abnormal muscle tone.     Coordination: Coordination normal.  Psychiatric:  Mood and Affect: Mood normal.        Behavior: Behavior normal.      Imaging: No results found.

## 2020-03-03 ENCOUNTER — Ambulatory Visit: Payer: Self-pay | Admitting: Cardiothoracic Surgery

## 2020-03-09 ENCOUNTER — Ambulatory Visit (INDEPENDENT_AMBULATORY_CARE_PROVIDER_SITE_OTHER): Payer: Self-pay | Admitting: Physical Medicine and Rehabilitation

## 2020-03-09 ENCOUNTER — Ambulatory Visit: Payer: Self-pay

## 2020-03-09 ENCOUNTER — Encounter: Payer: Self-pay | Admitting: Physical Medicine and Rehabilitation

## 2020-03-09 ENCOUNTER — Other Ambulatory Visit: Payer: Self-pay

## 2020-03-09 VITALS — BP 108/74 | HR 90

## 2020-03-09 DIAGNOSIS — M5412 Radiculopathy, cervical region: Secondary | ICD-10-CM

## 2020-03-09 MED ORDER — METHYLPREDNISOLONE ACETATE 80 MG/ML IJ SUSP
40.0000 mg | Freq: Once | INTRAMUSCULAR | Status: AC
Start: 2020-03-09 — End: 2020-03-09
  Administered 2020-03-09: 40 mg

## 2020-03-09 NOTE — Progress Notes (Signed)
Pt states pain in the back of the neck and radiates into both shoulders and sometimes into both arms. Pt states pain started about a year ago. Sleeping makes hands and arms go numb. Medication helps with pain.   .Numeric Pain Rating Scale and Functional Assessment Average Pain 10   In the last MONTH (on 0-10 scale) has pain interfered with the following?  1. General activity like being  able to carry out your everyday physical activities such as walking, climbing stairs, carrying groceries, or moving a chair?  Rating(10)   +Driver, -BT, -Dye Allergies.

## 2020-03-09 NOTE — Progress Notes (Signed)
Samantha [name] - 44 y.o. female MRN [id number]  Date of birth: August 02, 1976  Office Visit Note: Visit Date: 03/09/2020 PCP: Scot Jun, FNP Referred by: Scot Jun, FNP  Subjective: Chief Complaint  Patient presents with  . Neck - Pain  . Right Shoulder - Pain  . Left Shoulder - Pain  . Right Arm - Pain, Numbness  . Left Arm - Numbness, Pain  . Right Hand - Numbness  . Left Hand - Numbness   HPI:  Samantha [name] is a 44 y.o. female who comes in today for planned repeat midline C7-T1 Cervical epidural steroid injection with fluoroscopic guidance.  The patient has failed conservative care including home exercise, medications, time and activity modification.  This injection will be diagnostic and hopefully therapeutic.  Please see requesting physician notes for further details and justification. Patient received more than 50% pain relief from prior injection.   Referring: Dr. Anderson Malta  MRI reviewed with images and spine model.  MRI reviewed in the note below.  Patient with bilateral nondermatomal pain numbness and tingling from the neck to the shoulder to the arms and hands.  She did get some relief with the first injection and really did not go back to as much pain that she was having.  We will repeat the injection today and depending on that would follow-up with Dr. Marlou Sa.  Consideration would be given to surgical consultation versus perhaps even neurological consultation given the amount of numbness and tingling she is having.  If a lot of that is much much better I would say more than 80% better after the injection then clearly there is a cervical spine related issue.   ROS Otherwise per HPI.  Assessment & Plan: Visit Diagnoses:  1. Cervical radiculopathy     Plan: No additional findings.   Meds & Orders:  Meds ordered this encounter  Medications  . methylPREDNISolone acetate (DEPO-MEDROL) injection 40 mg    Orders Placed This Encounter    Procedures  . XR C-ARM NO REPORT  . Epidural Steroid injection    Follow-up: Return for visit to requesting physician as needed.   Procedures: No procedures performed  Cervical Epidural Steroid Injection - Interlaminar Approach with Fluoroscopic Guidance  Patient: Samantha [name]      Date of Birth: September 10, 1976 MRN: [id number] PCP: Scot Jun, FNP      Visit Date: 03/09/2020   Universal Protocol:    Date/Time: 03/10/2110:56 AM  Consent Given By: the patient  Position: PRONE  Additional Comments: Vital signs were monitored before and after the procedure. Patient was prepped and draped in the usual sterile fashion. The correct patient, procedure, and site was verified.   Injection Procedure Details:  Procedure Site One Meds Administered:  Meds ordered this encounter  Medications  . methylPREDNISolone acetate (DEPO-MEDROL) injection 40 mg     Laterality: Right  Location/Site: C7-T1  Needle size: 20 G  Needle type: Touhy  Needle Placement: Paramedian epidural space  Findings:  -Comments: Excellent flow of contrast into the epidural space.  Procedure Details: Using a paramedian approach from the side mentioned above, the region overlying the inferior lamina was localized under fluoroscopic visualization and the soft tissues overlying this structure were infiltrated with 4 ml. of 1% Lidocaine without Epinephrine. A # 20 gauge, Tuohy needle was inserted into the epidural space using a paramedian approach.  The epidural space was localized using loss of resistance along with lateral and contralateral oblique bi-planar fluoroscopic  views.  After negative aspirate for air, blood, and CSF, a 2 ml. volume of Isovue-250 was injected into the epidural space and the flow of contrast was observed. Radiographs were obtained for documentation purposes.   The injectate was administered into the level noted above.  Additional Comments:  The patient tolerated the  procedure well Dressing: 2 x 2 sterile gauze and Band-Aid    Post-procedure details: Patient was observed during the procedure. Post-procedure instructions were reviewed.  Patient left the clinic in stable condition.     Clinical History: CLINICAL DATA:  Initial evaluation for chronic neck pain with extension into the back and shoulders.  EXAM: MRI CERVICAL SPINE WITHOUT CONTRAST  TECHNIQUE: Multiplanar, multisequence MR imaging of the cervical spine was performed. No intravenous contrast was administered.  COMPARISON:  Prior radiograph from 12/21/2019.  FINDINGS: Alignment: Straightening of the normal cervical lordosis. Trace retrolisthesis of C5 on C6 and C6 on C7, chronic and degenerative.  Vertebrae: Vertebral body height maintained without evidence for acute or chronic fracture. Bone marrow signal intensity within normal limits. No discrete or worrisome osseous lesions. Prominent discogenic reactive endplate changes present about the C5-6 and C6-7 interspaces, with associated marrow edema at C6-7. Finding could contribute to underlying neck pain. No other abnormal marrow edema.  Cord: Signal intensity within the cervical spinal cord is normal.  Posterior Fossa, vertebral arteries, paraspinal tissues: Note made of an empty sella. Visualized brain and posterior fossa otherwise unremarkable. Craniocervical junction unremarkable. Paraspinous and prevertebral soft tissues within normal limits. Normal flow voids seen within the vertebral arteries bilaterally.  Disc levels:  C2-C3: Mild endplate osseous ridging without significant disc bulge. No canal or foraminal stenosis.  C3-C4: Minimal annular disc bulge with endplate spurring. No significant canal or foraminal stenosis.  C4-C5:  Minimal annular disc bulge.  No canal or foraminal stenosis.  C5-C6: Chronic intervertebral disc space narrowing with diffuse degenerative disc osteophyte. Broad posterior  component flattens and partially faces the ventral thecal sac resultant mild spinal stenosis. Mild cord flattening without cord signal changes. Moderate bilateral C6 foraminal stenosis, slightly worse on the left.  C6-C7: Chronic diffuse degenerative disc osteophyte with bilateral uncovertebral spurring, greater on the left. Broad posterior component flattens and partially faces the ventral thecal sac. Secondary mild spinal stenosis with mild cord flattening. Moderate left worse than right C7 foraminal stenosis.  C7-T1: Normal interspace. Left greater than right facet hypertrophy. No significant canal or foraminal stenosis.  Visualized upper thoracic spine demonstrates no significant finding.  IMPRESSION: 1. Degenerative disc osteophyte complexes at C5-6 and C6-7 with resultant mild canal, with moderate left worse than right C6 and C7 foraminal stenosis. 2. Prominent discogenic reactive endplate changes about the C5-6 and C6-7 interspaces, with associated marrow edema at the C6-7 level. Finding could contribute to underlying neck pain. 3. Incidental empty sella.   Electronically Signed   By: Jeannine Boga M.D.   On: 01/16/2020 07:49     Objective:  VS:  HT:    WT:   BMI:     BP:108/74  HR:90bpm  TEMP: ( )  RESP:  Physical Exam Vitals and nursing note reviewed.  Constitutional:      General: She is not in acute distress.    Appearance: Normal appearance. She is not ill-appearing.  HENT:     Head: Normocephalic and atraumatic.     Right Ear: External ear normal.     Left Ear: External ear normal.  Eyes:     Extraocular Movements: Extraocular movements  intact.  Cardiovascular:     Rate and Rhythm: Normal rate.     Pulses: Normal pulses.  Musculoskeletal:     Cervical back: Tenderness present. No rigidity.     Right lower leg: No edema.     Left lower leg: No edema.     Comments: Patient has good strength in the upper extremities including 5 out of 5  strength in wrist extension long finger flexion and APB.  There is no atrophy of the hands intrinsically.  There is a negative Hoffmann's test.   Lymphadenopathy:     Cervical: No cervical adenopathy.  Skin:    Findings: No erythema, lesion or rash.  Neurological:     General: No focal deficit present.     Mental Status: She is alert and oriented to person, place, and time.     Sensory: No sensory deficit.     Motor: No weakness or abnormal muscle tone.     Coordination: Coordination normal.  Psychiatric:        Mood and Affect: Mood normal.        Behavior: Behavior normal.      Imaging: XR C-ARM NO REPORT  Result Date: 03/09/2020 Please see Notes tab for imaging impression.

## 2020-03-10 NOTE — Procedures (Signed)
Cervical Epidural Steroid Injection - Interlaminar Approach with Fluoroscopic Guidance  Patient: Samantha [name]      Date of Birth: 15-Apr-1976 MRN: [id number] PCP: Scot Jun, FNP      Visit Date: 03/09/2020   Universal Protocol:    Date/Time: 03/10/2110:56 AM  Consent Given By: the patient  Position: PRONE  Additional Comments: Vital signs were monitored before and after the procedure. Patient was prepped and draped in the usual sterile fashion. The correct patient, procedure, and site was verified.   Injection Procedure Details:  Procedure Site One Meds Administered:  Meds ordered this encounter  Medications  . methylPREDNISolone acetate (DEPO-MEDROL) injection 40 mg     Laterality: Right  Location/Site: C7-T1  Needle size: 20 G  Needle type: Touhy  Needle Placement: Paramedian epidural space  Findings:  -Comments: Excellent flow of contrast into the epidural space.  Procedure Details: Using a paramedian approach from the side mentioned above, the region overlying the inferior lamina was localized under fluoroscopic visualization and the soft tissues overlying this structure were infiltrated with 4 ml. of 1% Lidocaine without Epinephrine. A # 20 gauge, Tuohy needle was inserted into the epidural space using a paramedian approach.  The epidural space was localized using loss of resistance along with lateral and contralateral oblique bi-planar fluoroscopic views.  After negative aspirate for air, blood, and CSF, a 2 ml. volume of Isovue-250 was injected into the epidural space and the flow of contrast was observed. Radiographs were obtained for documentation purposes.   The injectate was administered into the level noted above.  Additional Comments:  The patient tolerated the procedure well Dressing: 2 x 2 sterile gauze and Band-Aid    Post-procedure details: Patient was observed during the procedure. Post-procedure instructions were  reviewed.  Patient left the clinic in stable condition.

## 2020-03-24 ENCOUNTER — Other Ambulatory Visit: Payer: Self-pay | Admitting: Internal Medicine

## 2020-03-24 ENCOUNTER — Encounter: Payer: Self-pay | Admitting: Internal Medicine

## 2020-03-24 ENCOUNTER — Telehealth (INDEPENDENT_AMBULATORY_CARE_PROVIDER_SITE_OTHER): Payer: Self-pay | Admitting: Internal Medicine

## 2020-03-24 DIAGNOSIS — L723 Sebaceous cyst: Secondary | ICD-10-CM

## 2020-03-24 DIAGNOSIS — L732 Hidradenitis suppurativa: Secondary | ICD-10-CM

## 2020-03-24 MED ORDER — DOXYCYCLINE HYCLATE 100 MG PO TABS: 100.0000 mg | ORAL_TABLET | Freq: Two times a day (BID) | ORAL | 0 refills | Status: DC

## 2020-03-24 MED FILL — ?DOXYCYCLINE HYCLATE 100 MG: 100 | 30 days supply | Qty: 60 | Fill #0

## 2020-03-24 NOTE — Progress Notes (Signed)
Virtual Visit via MyChart Note  I connected with Samantha [name], on 03/24/2020 at 2:55 PM by MyChart due to the COVID-19 pandemic and verified that I am speaking with the correct person using two identifiers.   Consent: I discussed the limitations, risks, security and privacy concerns of performing an evaluation and management service by telephone and the availability of in person appointments. I also discussed with the patient that there may be a patient responsible charge related to this service. The patient expressed understanding and agreed to proceed.   Location of Patient: Home   Location of Provider: Clinic    Persons participating in Telemedicine visit: Jisella La-Vonne Sherlon Nied [facility] Dr. Juleen China    History of Present Illness: Patient has a video visit for concerns about hidradenitis suppurativa. Feels like it is worsening. Has recurrent lesions in her armpits, groin, and buttocks. Has also noticed a couple smaller lesions appearing around her nose recently. They have subsequently burst open and drained without issue. She is using Clindamycin gel without improvement. Reports has taken Doxycycline 100 mg BID in the past and seen significant improvement.   Also has concerns about a soft bump located around her hair line at her forehead. Currently covered by weft for her hair. Has been present for 6-7 years and has slowly increased in size.    Past Medical History:  Diagnosis Date  . Asthma   . Back pain   . 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 Hydradenitis   . Hyperlipidemia   . TIA (transient ischemic attack) 04/2015   questionable, most likely complicated migraine   No Known Allergies  Current Outpatient Medications on File Prior to Visit  Medication Sig Dispense Refill  . escitalopram (LEXAPRO) 10 MG tablet Take 1 tablet (10 mg total) by mouth at bedtime. 90 tablet 0  . gabapentin (NEURONTIN) 300 MG capsule Take 1 capsule (300 mg total) by mouth 3 (three) times  daily as needed. (Patient taking differently: Take 300 mg by mouth 3 (three) times daily as needed (for pain). ) 90 capsule 3  . metFORMIN (GLUCOPHAGE) 500 MG tablet Take 1 tablet (500 mg total) by mouth 2 (two) times daily with a meal. 180 tablet 3   No current facility-administered medications on file prior to visit.    Observations/Objective: NAD. Speaking clearly.  Work of breathing normal.  Skin surrounding nose does not appear erythematous or edematous on video.  Alert and oriented. Mood appropriate.   Assessment and Plan: 1. Hidradenitis suppurativa Will trial Doxycyline again. Discussed if no improvement, can explore other management options. Symptoms of secondary bacterial infection warranting further medical care reviewed.  - doxycycline (VIBRA-TABS) 100 MG tablet; Take 1 tablet (100 mg total) by mouth 2 (two) times daily.  Dispense: 180 tablet; Refill: 0  2. Sebaceous cyst Scalp lesion sounds most consistent with sebaceous cyst given location, nature of the lesion, and chronicity. Will set up appointment with Dr. Hulen Skains at [facility] to evaluate for potential removal.    Follow Up Instructions: PRN and for routine medical care    I discussed the assessment and treatment plan with the patient. The patient was provided an opportunity to ask questions and all were answered. The patient agreed with the plan and demonstrated an understanding of the instructions.   The patient was advised to call back or seek an in-person evaluation if the symptoms worsen or if the condition fails to improve as anticipated.     I provided 20 minutes total of non-face-to-face time during  this encounter including median intraservice time, reviewing previous notes, investigations, ordering medications, medical decision making, coordinating care and patient verbalized understanding at the end of the visit.    Phill Myron, D.O. [facility]  03/24/2020, 2:55 PM

## 2020-03-25 MED FILL — CYCLOBENZAPRINE 10 MG TAB: 10 | 10 days supply | Qty: 30 | Fill #2

## 2020-03-31 ENCOUNTER — Ambulatory Visit (INDEPENDENT_AMBULATORY_CARE_PROVIDER_SITE_OTHER): Payer: No Typology Code available for payment source | Admitting: Cardiothoracic Surgery

## 2020-03-31 ENCOUNTER — Other Ambulatory Visit: Payer: Self-pay

## 2020-03-31 ENCOUNTER — Ambulatory Visit
Admission: RE | Admit: 2020-03-31 | Discharge: 2020-03-31 | Disposition: A | Discharge status: Home / Self Care | Payer: No Typology Code available for payment source | Source: Ambulatory Visit | Attending: Cardiothoracic Surgery | Admitting: Cardiothoracic Surgery

## 2020-03-31 VITALS — BP 103/67 | HR 73 | Temp 98.1°F | Resp 20 | Ht 64.0 in | Wt 152.0 lb

## 2020-03-31 DIAGNOSIS — J939 Pneumothorax, unspecified: Secondary | ICD-10-CM

## 2020-03-31 NOTE — Patient Instructions (Signed)
encouraged to get vaccination:    COVID-19 Vaccine Information can be found at: [email or web address] For questions related to vaccine distribution or appointments, please email [email or web address] or call [phone].
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
## 2020-04-12 ENCOUNTER — Ambulatory Visit: Payer: Self-pay | Attending: General Surgery | Admitting: General Surgery

## 2020-04-12 ENCOUNTER — Encounter: Payer: Self-pay | Admitting: General Surgery

## 2020-04-12 ENCOUNTER — Other Ambulatory Visit: Payer: Self-pay

## 2020-04-12 DIAGNOSIS — D170 Benign lipomatous neoplasm of skin and subcutaneous tissue of head, face and neck: Secondary | ICD-10-CM

## 2020-04-12 DIAGNOSIS — G4700 Insomnia, unspecified: Secondary | ICD-10-CM

## 2020-04-12 NOTE — Progress Notes (Signed)
New Patient Office Visit  Subjective:  Patient ID: Samantha [name], female    DOB: 10-Jul-1976  Age: 44 y.o. MRN: [id number]  CC: No chief complaint on file.   HPI Samantha [name] presents for left forehead/hairline soft cystic or lipomatous mass  Past Medical History:  Diagnosis Date  . Asthma   . Back pain   . 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 Hydradenitis   . Hyperlipidemia   . TIA (transient ischemic attack) 04/2015   questionable, most likely complicated migraine    Past Surgical History:  Procedure Laterality Date  . CERVICAL BIOPSY  W/ LOOP ELECTRODE EXCISION    . ESOPHAGOGASTRODUODENOSCOPY (EGD) WITH PROPOFOL N/A 10/14/2018   Procedure: ESOPHAGOGASTRODUODENOSCOPY (EGD) WITH PROPOFOL;  Surgeon: Wilford Corner, MD;  Location: [facility];  Service: Endoscopy;  Laterality: N/A;  . LEEP      Family History  Problem Relation Age of Onset  . Hypertension Mother   . Lupus Mother   . Diabetes Father   . Thyroid disease Sister   . Diabetes Sister   . Cancer Sister        stomach  . Diabetes Maternal Grandmother   . Anesthesia problems Neg Hx   . Other Neg Hx   . Migraines Neg Hx     Social History   Socioeconomic History  . Marital status: Single    Spouse name: Not on file  . Number of children: 1  . Years of education: 9  . Highest education level: Not on file  Occupational History  . Not on file  Tobacco Use  . Smoking status: Current Every Day Smoker    Packs/day: 1.00    Years: 17.00    Pack years: 17.00    Types: Cigars  . Smokeless tobacco: Never Used  . Tobacco comment: 3 cigarettes a day   Vaping Use  . Vaping Use: Never used  Substance and Sexual Activity  . Alcohol use: Yes    Alcohol/week: 1.0 standard drink    Types: 1 Shots of liquor per week    Comment: drink on the weekends  . Drug use: Yes    Frequency: 3.0 times per week    Types: Marijuana  . Sexual activity: Not Currently    Birth control/protection: None  Other  Topics Concern  . Not on file  Social History Narrative   Patient drinks sodas all day long.   Patient is right handed.    Social Determinants of Health   Financial Resource Strain:   . Difficulty of Paying Living Expenses:   Food Insecurity:   . Worried About Charity fundraiser in the Last Year:   . Arboriculturist in the Last Year:   Transportation Needs:   . Film/video editor (Medical):   Marland Kitchen Lack of Transportation (Non-Medical):   Physical Activity:   . Days of Exercise per Week:   . Minutes of Exercise per Session:   Stress:   . Feeling of Stress :   Social Connections:   . Frequency of Communication with Friends and Family:   . Frequency of Social Gatherings with Friends and Family:   . Attends Religious Services:   . Active Member of Clubs or Organizations:   . Attends Archivist Meetings:   Marland Kitchen Marital Status:   Intimate Partner Violence:   . Fear of Current or Ex-Partner:   . Emotionally Abused:   Marland Kitchen Physically Abused:   . Sexually Abused:  ROS Review of Systems  Constitutional:       Sleeps poorly, insomnia  All other systems reviewed and are negative.   Objective:   Today's Vitals: LMP 03/29/2020   Physical Exam HENT:     Head:      Assessment & Plan:   Left hairline cyst, or lipoma.  Because of procedural limitations in this office, will refer to Plastic surgeon for office procedure there.  Not concerned about cancer   Problem List Items Addressed This Visit    None    Visit Diagnoses    Lipoma of scalp    -  Primary   Insomnia, unspecified type          Outpatient Encounter Medications as of 04/12/2020  Medication Sig  . doxycycline (VIBRA-TABS) 100 MG tablet Take 1 tablet (100 mg total) by mouth 2 (two) times daily.  Marland Kitchen escitalopram (LEXAPRO) 10 MG tablet Take 1 tablet (10 mg total) by mouth at bedtime.  . gabapentin (NEURONTIN) 300 MG capsule Take 1 capsule (300 mg total) by mouth 3 (three) times daily as needed. (Patient  taking differently: Take 300 mg by mouth 3 (three) times daily as needed (for pain). )  . metFORMIN (GLUCOPHAGE) 500 MG tablet Take 1 tablet (500 mg total) by mouth 2 (two) times daily with a meal.   No facility-administered encounter medications on file as of 04/12/2020.    Follow-up: Return if symptoms worsen or fail to improve.   Judeth Horn, MD

## 2020-04-12 NOTE — Progress Notes (Signed)
Here for Cyst on upper forehead evaluation

## 2020-04-18 ENCOUNTER — Other Ambulatory Visit: Payer: Self-pay | Admitting: Internal Medicine

## 2020-04-19 ENCOUNTER — Ambulatory Visit: Payer: Self-pay | Admitting: General Surgery

## 2020-04-20 ENCOUNTER — Other Ambulatory Visit: Payer: Self-pay | Admitting: Obstetrics and Gynecology

## 2020-04-20 DIAGNOSIS — Z1231 Encounter for screening mammogram for malignant neoplasm of breast: Secondary | ICD-10-CM

## 2020-05-18 ENCOUNTER — Ambulatory Visit (HOSPITAL_COMMUNITY)
Admission: EM | Admit: 2020-05-18 | Discharge: 2020-05-18 | Disposition: A | Discharge status: Home / Self Care | Payer: No Typology Code available for payment source | Attending: Family Medicine | Admitting: Family Medicine

## 2020-05-18 ENCOUNTER — Ambulatory Visit (INDEPENDENT_AMBULATORY_CARE_PROVIDER_SITE_OTHER): Payer: No Typology Code available for payment source

## 2020-05-18 ENCOUNTER — Other Ambulatory Visit: Payer: Self-pay

## 2020-05-18 ENCOUNTER — Encounter (HOSPITAL_COMMUNITY): Payer: Self-pay | Admitting: Emergency Medicine

## 2020-05-18 DIAGNOSIS — M25471 Effusion, right ankle: Secondary | ICD-10-CM

## 2020-05-18 DIAGNOSIS — M25571 Pain in right ankle and joints of right foot: Secondary | ICD-10-CM

## 2020-05-18 DIAGNOSIS — S93431A Sprain of tibiofibular ligament of right ankle, initial encounter: Secondary | ICD-10-CM

## 2020-05-18 NOTE — ED Provider Notes (Signed)
[facility]    CSN: [id number] Arrival date & time: 05/18/20  1704      History   Chief Complaint Chief Complaint  Patient presents with  . Fall    HPI Samantha [name] is a 44 y.o. female.   Patient turned ankle about 2 weeks ago.  There was some initial swelling.  She has been walking on it but not much.  Pain is localized over the lateral aspect.  HPI  Past Medical History:  Diagnosis Date  . Asthma   . Back pain   . 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 Hydradenitis   . Hyperlipidemia   . TIA (transient ischemic attack) 04/2015   questionable, most likely complicated migraine    Patient Active Problem List   Diagnosis Date Noted  . Pneumothorax 02/08/2020  . GIB (gastrointestinal bleeding) 10/13/2018  . Amenorrhea 09/17/2017  . Adjustment disorder with mixed anxiety and depressed mood 06/26/2016  . Depressed mood 06/26/2016  . Back pain 01/31/2016  . 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 Acute pericarditis 12/26/2015  . Hidradenitis suppurativa 11/07/2015  . 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 Sinusitis 10/20/2015  . Complicated migraine 78/46/9629  . Marijuana use 10/20/2015  . Smoker 05/04/2015  . TIA (transient ischemic attack) 05/04/2015  . Palpitations 05/04/2015  . Decreased appetite 05/04/2015  . 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 Hyperlipidemia 04/22/2015  . TOA (tubo-ovarian abscess) 11/23/2012  . History of abnormal Pap smear 12/20/2011  . History of PID 12/20/2011    Past Surgical History:  Procedure Laterality Date  . CERVICAL BIOPSY  W/ LOOP ELECTRODE EXCISION    . ESOPHAGOGASTRODUODENOSCOPY (EGD) WITH PROPOFOL N/A 10/14/2018   Procedure: ESOPHAGOGASTRODUODENOSCOPY (EGD) WITH PROPOFOL;  Surgeon: Wilford Corner, MD;  Location: [facility];  Service: Endoscopy;  Laterality: N/A;  . LEEP      OB History    Gravida  2   Para  1   Term  1   Preterm      AB  1   Living  1     SAB  1   TAB      Ectopic        Multiple      Live Births               Home Medications    Prior to Admission medications   Medication Sig Start Date End Date Taking? Authorizing Provider  doxycycline (VIBRA-TABS) 100 MG tablet Take 1 tablet (100 mg total) by mouth 2 (two) times daily. 03/24/20   Nicolette Bang, DO  escitalopram (LEXAPRO) 10 MG tablet Take 1 tablet (10 mg total) by mouth at bedtime. 09/21/19   Fulp, Cammie, MD  gabapentin (NEURONTIN) 300 MG capsule Take 1 capsule (300 mg total) by mouth 3 (three) times daily as needed. Patient taking differently: Take 300 mg by mouth 3 (three) times daily as needed (for pain).  08/10/19   Fulp, Cammie, MD  metFORMIN (GLUCOPHAGE) 500 MG tablet Take 1 tablet (500 mg total) by mouth 2 (two) times daily with a meal. 08/10/19   Fulp, Cammie, MD    Family History Family History  Problem Relation Age of Onset  . Hypertension Mother   . Lupus Mother   . Diabetes Father   . Thyroid disease Sister   . Diabetes Sister   . Cancer Sister        stomach  . Diabetes Maternal Grandmother   . Anesthesia problems Neg Hx   . Other  Neg Hx   . Migraines Neg Hx     Social History Social History   Tobacco Use  . Smoking status: Current Every Day Smoker    Packs/day: 1.00    Years: 17.00    Pack years: 17.00    Types: Cigars  . Smokeless tobacco: Never Used  . Tobacco comment: 3 cigarettes a day   Vaping Use  . Vaping Use: Never used  Substance Use Topics  . Alcohol use: Yes    Alcohol/week: 1.0 standard drink    Types: 1 Shots of liquor per week    Comment: drink on the weekends  . Drug use: Yes    Frequency: 3.0 times per week    Types: Marijuana     Allergies   Patient has no known allergies.   Review of Systems Review of Systems  Musculoskeletal: Positive for arthralgias and joint swelling.  All other systems reviewed and are negative.    Physical Exam Triage Vital Signs ED Triage Vitals  Enc Vitals Group     BP 05/18/20 1803  102/67     Pulse Rate 05/18/20 1803 69     Resp 05/18/20 1803 17     Temp 05/18/20 1803 98.8 F (37.1 C)     Temp Source 05/18/20 1803 Oral     SpO2 05/18/20 1803 100 %     Weight --      Height --      Head Circumference --      Peak Flow --      Pain Score 05/18/20 1802 8     Pain Loc --      Pain Edu? --      Excl. in [place]? --    No data found.  Updated Vital Signs BP 102/67 (BP Location: Right Arm)   Pulse 69   Temp 98.8 F (37.1 C) (Oral)   Resp 17   LMP 05/17/2020   SpO2 100%   Visual Acuity Right Eye Distance:   Left Eye Distance:   Bilateral Distance:    Right Eye Near:   Left Eye Near:    Bilateral Near:     Physical Exam Vitals and nursing note reviewed.  Constitutional:      Appearance: Normal appearance.  Cardiovascular:     Rate and Rhythm: Normal rate and regular rhythm.  Pulmonary:     Effort: Pulmonary effort is normal.     Breath sounds: Normal breath sounds.  Musculoskeletal:     Comments: Right ankle there is tenderness over the lateral malleolus.  Strength appears normal and range of motion is limited due to pain there is no tenderness over the fifth metatarsal  Neurological:     Mental Status: She is alert.      UC Treatments / Results  Labs (all labs ordered are listed, but only abnormal results are displayed) Labs Reviewed - No data to display  EKG   RadiologyNo results found.  x-ray of the ankle shows no fractures.  Ankle mortise appears symmetric.  There is soft tissue swelling laterally. Procedures Procedures (including critical care time)  Medications Ordered in UC Medications - No data to display  Initial Impression / Assessment and Plan / UC Course  I have reviewed the triage vital signs and the nursing notes.  Pertinent labs & imaging results that were available during my care of the patient were reviewed by me and considered in my medical decision making (see chart for details).     Sprain right  ankle Final  Clinical Impressions(s) / UC Diagnoses   Final diagnoses:  None   Discharge Instructions   None    ED Prescriptions    None     PDMP not reviewed this encounter.   Wardell Honour, MD 05/18/20 Samantha [name]

## 2020-05-18 NOTE — ED Triage Notes (Signed)
Pt presents with right leg and ankle after "missing two steps" while going down steps xs 2 weeks ago. States twisted ankle at that time. C/o of swelling and tinder to touch.

## 2020-05-19 ENCOUNTER — Encounter: Payer: Self-pay | Admitting: Plastic Surgery

## 2020-05-19 ENCOUNTER — Ambulatory Visit (INDEPENDENT_AMBULATORY_CARE_PROVIDER_SITE_OTHER): Payer: No Typology Code available for payment source | Admitting: Plastic Surgery

## 2020-05-19 VITALS — HR 63 | Temp 98.3°F | Ht 64.0 in | Wt 158.4 lb

## 2020-05-19 DIAGNOSIS — D170 Benign lipomatous neoplasm of skin and subcutaneous tissue of head, face and neck: Secondary | ICD-10-CM

## 2020-05-19 NOTE — Progress Notes (Signed)
   Referring Provider Nicolette Bang, DO [place],   [id number]   CC:  Chief Complaint  Patient presents with  . Advice Only      Samantha [name] is an 44 y.o. female.  HPI: Patient presents with a mass on her left forehead.  Is been present for a number of years and is growing in size.  She would like to have it removed if possible.  It catches on things and affects what she can do with her hair.  No Known Allergies  Outpatient Encounter Medications as of 05/19/2020  Medication Sig  . doxycycline (VIBRA-TABS) 100 MG tablet Take 1 tablet (100 mg total) by mouth 2 (two) times daily.  Marland Kitchen escitalopram (LEXAPRO) 10 MG tablet Take 1 tablet (10 mg total) by mouth at bedtime.  . gabapentin (NEURONTIN) 300 MG capsule Take 1 capsule (300 mg total) by mouth 3 (three) times daily as needed. (Patient taking differently: Take 300 mg by mouth 3 (three) times daily as needed (for pain). )  . metFORMIN (GLUCOPHAGE) 500 MG tablet Take 1 tablet (500 mg total) by mouth 2 (two) times daily with a meal.   No facility-administered encounter medications on file as of 05/19/2020.     Past Medical History:  Diagnosis Date  . Asthma   . Back pain   . 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 Hydradenitis   . Hyperlipidemia   . TIA (transient ischemic attack) 04/2015   questionable, most likely complicated migraine    Past Surgical History:  Procedure Laterality Date  . CERVICAL BIOPSY  W/ LOOP ELECTRODE EXCISION    . ESOPHAGOGASTRODUODENOSCOPY (EGD) WITH PROPOFOL N/A 10/14/2018   Procedure: ESOPHAGOGASTRODUODENOSCOPY (EGD) WITH PROPOFOL;  Surgeon: Wilford Corner, MD;  Location: [facility];  Service: Endoscopy;  Laterality: N/A;  . LEEP      Family History  Problem Relation Age of Onset  . Hypertension Mother   . Lupus Mother   . Diabetes Father   . Thyroid disease Sister   . Diabetes Sister   . Cancer Sister        stomach  . Diabetes Maternal Grandmother   .  Anesthesia problems Neg Hx   . Other Neg Hx   . Migraines Neg Hx     Social History   Social History Narrative   Patient drinks sodas all day long.   Patient is right handed.      Review of Systems General: Denies fevers, chills, weight loss CV: Denies chest pain, shortness of breath, palpitations  Physical Exam Vitals with BMI 05/19/2020 05/18/2020 03/31/2020  Height 5\' 4"  - 5\' 4"   Weight 158 lbs 6 oz - 152 lbs  BMI 01.60 - 10.93  Systolic (No Data) 235 573  Diastolic (No Data) 67 67  Pulse 63 69 73    General:  No acute distress,  Alert and oriented, Non-Toxic, Normal speech and affect Examination shows a 3 cm subcutaneous mass at the hairline on the left side.  It is freely mobile and there is no overlying skin changes.  Assessment/Plan Patient has what looks like a left forehead lipoma.  We discussed excision.  We discussed the risks include bleeding, infection, damage surrounding structures and need for additional procedures.  We will plan to move ahead with scheduling this under local.  All her questions were answered.  Cindra Presume 05/19/2020, 10:15 AM

## 2020-05-26 ENCOUNTER — Ambulatory Visit: Payer: No Typology Code available for payment source

## 2020-06-07 ENCOUNTER — Other Ambulatory Visit: Payer: No Typology Code available for payment source

## 2020-06-07 DIAGNOSIS — Z20822 Contact with and (suspected) exposure to covid-19: Secondary | ICD-10-CM

## 2020-06-08 LAB — SARS-COV-2, NAA 2 DAY TAT

## 2020-06-08 LAB — NOVEL CORONAVIRUS, NAA: SARS-CoV-2, NAA: NOT DETECTED

## 2020-06-21 MED FILL — CYCLOBENZAPRINE 10 MG TAB: 10 | 10 days supply | Qty: 30 | Fill #3

## 2020-06-21 MED FILL — DOXYCYCLINE HYCLATE 100 MG: 100 | 30 days supply | Qty: 60 | Fill #0

## 2020-07-13 ENCOUNTER — Other Ambulatory Visit (HOSPITAL_COMMUNITY)
Admission: RE | Admit: 2020-07-13 | Discharge: 2020-07-13 | Disposition: A | Discharge status: Home / Self Care | Payer: No Typology Code available for payment source | Source: Ambulatory Visit | Attending: Plastic Surgery | Admitting: Plastic Surgery

## 2020-07-13 ENCOUNTER — Encounter: Payer: Self-pay | Admitting: Plastic Surgery

## 2020-07-13 ENCOUNTER — Other Ambulatory Visit: Payer: Self-pay

## 2020-07-13 ENCOUNTER — Ambulatory Visit (INDEPENDENT_AMBULATORY_CARE_PROVIDER_SITE_OTHER): Payer: No Typology Code available for payment source | Admitting: Plastic Surgery

## 2020-07-13 VITALS — BP 108/76 | HR 79 | Temp 98.6°F

## 2020-07-13 DIAGNOSIS — D170 Benign lipomatous neoplasm of skin and subcutaneous tissue of head, face and neck: Secondary | ICD-10-CM | POA: Insufficient documentation

## 2020-07-13 NOTE — Progress Notes (Signed)
Operative Note   DATE OF OPERATION: 07/13/2020  LOCATION:    SURGICAL DEPARTMENT: Plastic Surgery  PREOPERATIVE DIAGNOSES: Forehead lipoma  POSTOPERATIVE DIAGNOSES:  same  PROCEDURE:  1. Excision of left forehead submuscular lipoma measuring 3 cm 2. Complex closure measuring 3 cm  SURGEON: Talmadge Coventry, MD  ANESTHESIA:  Local  COMPLICATIONS: None.   INDICATIONS FOR PROCEDURE:  The patient, Samantha [name] is a 44 y.o. female born on 15-Oct-1975, is here for treatment of left forehead lipoma MRN: [id number]  CONSENT:  Informed consent was obtained directly from the patient. Risks, benefits and alternatives were fully discussed. Specific risks including but not limited to bleeding, infection, hematoma, seroma, scarring, pain, infection, wound healing problems, and need for further surgery were all discussed. The patient did have an ample opportunity to have questions answered to satisfaction.   DESCRIPTION OF PROCEDURE:  Local anesthesia was administered. The patient's operative site was prepped and draped in a sterile fashion. A time out was performed and all information was confirmed to be correct.  The lesion was excised with a 15 blade and tenotomy dissection.  Hemostasis was obtained.  Circumferential undermining was performed and the skin was advanced and closed in layers with interrupted buried Monocryl sutures and 5-0 fast gut for the skin.  The lesion excised measured 3 cm, and the total length of closure measured 3 cm.    The patient tolerated the procedure well.  There were no complications.

## 2020-07-14 LAB — SURGICAL PATHOLOGY

## 2020-08-16 ENCOUNTER — Ambulatory Visit
Admission: RE | Admit: 2020-08-16 | Discharge: 2020-08-16 | Disposition: A | Discharge status: Home / Self Care | Payer: No Typology Code available for payment source | Source: Ambulatory Visit | Attending: Obstetrics and Gynecology | Admitting: Obstetrics and Gynecology

## 2020-08-16 ENCOUNTER — Other Ambulatory Visit: Payer: Self-pay

## 2020-08-16 ENCOUNTER — Ambulatory Visit: Payer: No Typology Code available for payment source | Admitting: *Deleted

## 2020-08-16 VITALS — BP 110/72

## 2020-08-16 DIAGNOSIS — Z1239 Encounter for other screening for malignant neoplasm of breast: Secondary | ICD-10-CM

## 2020-08-16 DIAGNOSIS — R2233 Localized swelling, mass and lump, upper limb, bilateral: Secondary | ICD-10-CM

## 2020-08-16 DIAGNOSIS — N6311 Unspecified lump in the right breast, upper outer quadrant: Secondary | ICD-10-CM

## 2020-08-16 DIAGNOSIS — Z1231 Encounter for screening mammogram for malignant neoplasm of breast: Secondary | ICD-10-CM

## 2020-08-16 NOTE — Progress Notes (Addendum)
Samantha [name] is a 44 y.o. female who presents to [facility] clinic today with no complaints.    Pap Smear: Pap smear not completed today. Last Pap smear was 09/18/2017 at [facility] for [place] and was normal with negative HPV. 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 Patient stated she had a LEEP after the colposcopy for follow-up. Patient stated all Pap smears have been normal since LEEP and has had at least three normal Pap smears. Last Pap smear result is available in Epic.   Physical exam: Breasts Breasts symmetrical. No skin abnormalities bilateral breasts. No nipple retraction bilateral breasts. No nipple discharge bilateral breasts. No lymphadenopathy. Palpated a mobile lump within the right breast at 10 o'clock 1 cm from the nipple. Palpated two right axillary lumps a bb size lump at 2 o'clock 18 cm from the nipple and pea sized lump at 2 o'clock 16 cm from the nipple. Palpated two left axillary lumps at 2 o'clock 15 cm from the nipple that measures 4 cm x 2 cm and a pea sized lump at 2 o'clock 20 cm from the nipple. Complaints of pain when palpated bilateral axillary lumps.   Pelvic/Bimanual Pap is not indicated today per BCCCP guidelines.   Smoking History: Patient is a former smoker that quit 02/07/2020. Patient stated she still vapes. Discussed smoking cessation and risks associated with vaping with patient. Referred to the [facility] Quitline and gave resources on the free smoking cessation classes at [facility].   Patient Navigation: Patient education provided. Access to services provided for patient through BCCCP program.    Breast and Cervical Cancer Risk Assessment: Patient does not have family history of breast cancer, known genetic mutations, or radiation treatment to the chest before age 46. Patient has history of cervical dysplasia. Patient has no history of being immunocompromised or  DES exposure in-utero.  Risk Assessment    Risk Scores      08/16/2020   Last edited by: Demetrius Revel, LPN   5-year risk: 0.8 %   Lifetime risk: 9.4 %          A: BCCCP exam without pap smear No complaints.  P: Referred patient to the [facility] for a diagnostic mammogram. Patient will be called to schedule appointment.  Loletta Parish, RN 08/16/2020 11:08 AM

## 2020-08-16 NOTE — Patient Instructions (Addendum)
Explained breast self awareness with Samantha [name]. Patient did not need a Pap smear today due to last Pap smear and HPV typing was 09/18/2017. Let her know BCCCP will cover Pap smears every 3 years unless has a history of abnormal Pap smears. Referred patient to the [facility] for a diagnostic mammogram. Let patient know Nevin Bloodgood will call her to schedule diagnostic mammogram and Pap smear. Discussed smoking cessation and risks associated with vaping with patient. Referred to the [facility] Quitline and gave resources on the free smoking cessation classes at [facility]. Samantha [name] verbalized understanding.  Samantha [name], Arvil Chaco, RN 11:08 AM

## 2020-08-22 MED FILL — DOXYCYCLINE HYCLATE 100 MG: 100 | 30 days supply | Qty: 60 | Fill #1

## 2020-08-23 ENCOUNTER — Telehealth: Payer: Self-pay

## 2020-08-23 NOTE — Telephone Encounter (Signed)
1) Medication(s) Requested (by name): muscle spasms med.   2) Pharmacy of Choice: [facility] pharmacy   3) Special Requests:   Approved medications will be sent to the pharmacy, we will reach out if there is an issue.  Requests made after 3pm may not be addressed until the following business day!  If a patient is unsure of the name of the medication(s) please note and ask patient to call back when they are able to provide all info, do not send to responsible party until all information is available!

## 2020-08-24 NOTE — Telephone Encounter (Signed)
Called pt made and appointment

## 2020-08-25 ENCOUNTER — Telehealth (INDEPENDENT_AMBULATORY_CARE_PROVIDER_SITE_OTHER): Payer: No Typology Code available for payment source | Admitting: Internal Medicine

## 2020-08-25 ENCOUNTER — Other Ambulatory Visit: Payer: Self-pay | Admitting: Internal Medicine

## 2020-08-25 DIAGNOSIS — M62830 Muscle spasm of back: Secondary | ICD-10-CM

## 2020-08-25 DIAGNOSIS — L732 Hidradenitis suppurativa: Secondary | ICD-10-CM

## 2020-08-25 DIAGNOSIS — G479 Sleep disorder, unspecified: Secondary | ICD-10-CM

## 2020-08-25 MED ORDER — DOXYCYCLINE HYCLATE 100 MG PO TABS: 100.0000 mg | ORAL_TABLET | Freq: Two times a day (BID) | ORAL | 0 refills | Status: DC

## 2020-08-25 MED ORDER — CYCLOBENZAPRINE HCL 10 MG PO TABS: 10.0000 mg | ORAL_TABLET | Freq: Three times a day (TID) | ORAL | 2 refills | Status: DC | PRN

## 2020-08-25 MED FILL — DOXYCYCLINE HYCLATE 100 MG: 100 | 30 days supply | Qty: 60 | Fill #0

## 2020-08-25 MED FILL — CYCLOBENZAPRINE 10 MG TAB: 10 | 10 days supply | Qty: 30 | Fill #0

## 2020-08-25 NOTE — Progress Notes (Signed)
Virtual Visit via Telephone Note  I connected with Samantha [name], on 08/25/2020 at 1:53 PM by telephone due to the COVID-19 pandemic and verified that I am speaking with the correct person using two identifiers.   Consent: I discussed the limitations, risks, security and privacy concerns of performing an evaluation and management service by telephone and the availability of in person appointments. I also discussed with the patient that there may be a patient responsible charge related to this service. The patient expressed understanding and agreed to proceed.   Location of Patient: Home   Location of Provider: Clinic    Persons participating in Telemedicine visit: Angelie La-Vonne Madelynne L[facility] Dr. Juleen China      History of Present Illness: Patient has a visit for follow up. Would like a refill for a muscle relaxer. Has chronic muscle tension in her lower back and shoulders. Takes once per day in the morning for pain relief.   Asks to see a sleep specialist. Says she suffers from vivid dreams all night long and then feels like she's still tired in the AM without having any sleep. Snores at night. Tries to avoid napping during the day. Sometimes feels like she has to wake up and needs to gasp for air. Has tried Tylenol PM and Melatonin to try get rid of the dreams but has had no relief.    Past Medical History:  Diagnosis Date  . Anxiety    Phreesia 08/25/2020  . Asthma   . Back pain   . 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 Depression    Phreesia 08/25/2020  . Diabetes mellitus without complication ([place])    Phreesia 08/25/2020  . Hydradenitis   . Hyperlipidemia   . TIA (transient ischemic attack) 04/2015   questionable, most likely complicated migraine   No Known Allergies  Current Outpatient Medications on File Prior to Visit  Medication Sig Dispense Refill  . cyclobenzaprine (FLEXERIL) 10 MG tablet Take 10 mg by mouth 3 (three) times daily as needed.    .  doxycycline (VIBRA-TABS) 100 MG tablet Take 1 tablet (100 mg total) by mouth 2 (two) times daily. 180 tablet 0  . escitalopram (LEXAPRO) 10 MG tablet Take 1 tablet (10 mg total) by mouth at bedtime. 90 tablet 0  . gabapentin (NEURONTIN) 300 MG capsule Take 1 capsule (300 mg total) by mouth 3 (three) times daily as needed. (Patient taking differently: Take 300 mg by mouth 3 (three) times daily as needed (for pain). ) 90 capsule 3  . metFORMIN (GLUCOPHAGE) 500 MG tablet Take 1 tablet (500 mg total) by mouth 2 (two) times daily with a meal. 180 tablet 3   No current facility-administered medications on file prior to visit.    Observations/Objective: NAD. Speaking clearly.  Work of breathing normal.  Alert and oriented. Mood appropriate.   Assessment and Plan: 1. Muscle spasm of back - cyclobenzaprine (FLEXERIL) 10 MG tablet; Take 1 tablet (10 mg total) by mouth 3 (three) times daily as needed.  Dispense: 30 tablet; Refill: 2  2. Hidradenitis suppurativa - doxycycline (VIBRA-TABS) 100 MG tablet; Take 1 tablet (100 mg total) by mouth 2 (two) times daily.  Dispense: 180 tablet; Refill: 0  3. Sleep disorder Patient has some symptoms that could be concerning for OSA; however, also has other sleep complaints. Will refer to sleep specialist. Likely will need sleep study.  - Ambulatory referral to Neurology   Follow Up Instructions: PRN and for routine medical care    I  discussed the assessment and treatment plan with the patient. The patient was provided an opportunity to ask questions and all were answered. The patient agreed with the plan and demonstrated an understanding of the instructions.   The patient was advised to call back or seek an in-person evaluation if the symptoms worsen or if the condition fails to improve as anticipated.     I provided 8 minutes total of non-face-to-face time during this encounter including median intraservice time, reviewing previous notes, investigations,  ordering medications, medical decision making, coordinating care and patient verbalized understanding at the end of the visit.    Phill Myron, D.O. Primary Care at [facility]  08/25/2020, 1:53 PM

## 2020-08-30 ENCOUNTER — Ambulatory Visit
Admission: RE | Admit: 2020-08-30 | Discharge: 2020-08-30 | Disposition: A | Discharge status: Home / Self Care | Payer: No Typology Code available for payment source | Source: Ambulatory Visit | Attending: Obstetrics and Gynecology | Admitting: Obstetrics and Gynecology

## 2020-08-30 ENCOUNTER — Other Ambulatory Visit: Payer: No Typology Code available for payment source

## 2020-08-30 DIAGNOSIS — R2233 Localized swelling, mass and lump, upper limb, bilateral: Secondary | ICD-10-CM

## 2020-08-30 DIAGNOSIS — N6311 Unspecified lump in the right breast, upper outer quadrant: Secondary | ICD-10-CM

## 2020-10-03 ENCOUNTER — Other Ambulatory Visit: Payer: Self-pay | Admitting: *Deleted

## 2020-10-03 ENCOUNTER — Other Ambulatory Visit: Payer: Self-pay

## 2020-10-03 DIAGNOSIS — Z124 Encounter for screening for malignant neoplasm of cervix: Secondary | ICD-10-CM

## 2020-10-03 NOTE — Progress Notes (Signed)
Patient: Samantha [name]           Date of Birth: Mar 25, 1976           MRN: [id number] Visit Date: 10/03/2020 PCP: Nicolette Bang, DO  Cervical Cancer Screening Do you smoke?: No Have you ever had or been told you have an allergy to latex products?: No Marital status: Single Date of last pap smear: 2-5 yrs ago Date of last menstrual period: 09/15/20 Number of pregnancies: 2 Number of births: 1 Have you ever had any of the following? Hysterectomy: No Tubal ligation (tubes tied): No Abnormal bleeding: Yes Abnormal pap smear: Yes (abnormal pap over 10 years ago, colpo and LEEP for FU) Venereal warts: No A sex partner with venereal warts: No A high risk* sex partner: No  Cervical Exam  Abnormal Observations: Scant amount of thick white discharge observed in vagina. Recommendations: Last Pap smear was 09/18/2017 at [facility] for [place] and was normal with negative HPV. 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 Patient stated she had a LEEP after the colposcopy for follow-up. Patient stated all Pap smears have been normal since LEEP and has had at least three normal Pap smears. Last Pap smear result is available in Epic. Let patient know that she will receive the results of her Pap smear by letter or phone within the next couple of weeks.      Patient's History Patient Active Problem List   Diagnosis Date Noted  . Pneumothorax 02/08/2020  . GIB (gastrointestinal bleeding) 10/13/2018  . Amenorrhea 09/17/2017  . Adjustment disorder with mixed anxiety and depressed mood 06/26/2016  . Depressed mood 06/26/2016  . Back pain 01/31/2016  . 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 Acute pericarditis 12/26/2015  . Hidradenitis suppurativa 11/07/2015  . 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 Sinusitis 10/20/2015  . Complicated migraine 40/98/1191  . Marijuana use 10/20/2015  .  Smoker 05/04/2015  . TIA (transient ischemic attack) 05/04/2015  . Palpitations 05/04/2015  . Decreased appetite 05/04/2015  . 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 Hyperlipidemia 04/22/2015  . TOA (tubo-ovarian abscess) 11/23/2012  . History of abnormal Pap smear 12/20/2011  . History of PID 12/20/2011   Past Medical History:  Diagnosis Date  . Anxiety    Phreesia 08/25/2020  . Asthma   . Back pain   . 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 Depression    Phreesia 08/25/2020  . Diabetes mellitus without complication ([place])    Phreesia 08/25/2020  . Hydradenitis   . Hyperlipidemia   . TIA (transient ischemic attack) 04/2015   questionable, most likely complicated migraine    Family History  Problem Relation Age of Onset  . Hypertension Mother   . Lupus Mother   . Diabetes Father   . Thyroid disease Sister   . Diabetes Sister   . Cancer Sister        stomach  . Diabetes Maternal Grandmother   . Anesthesia problems Neg Hx   . Other Neg Hx   . Migraines Neg Hx     Social History   Occupational History  . Not on file  Tobacco Use  . Smoking status: Former Smoker    Packs/day: 1.00    Years: 17.00    Pack years: 17.00    Types: Cigars  . Smokeless tobacco: Never Used  . Tobacco comment: quit 02/08/2020  Vaping Use  . Vaping Use: Some days  Substance and Sexual Activity  . Alcohol use: Yes    Alcohol/week: 1.0 standard drink    Types: 1 Shots of liquor per week    Comment: drink on the weekends  . Drug use: Not Currently    Frequency: 3.0 times per week    Types: Marijuana  . Sexual activity: Not Currently    Birth control/protection: None

## 2020-10-04 ENCOUNTER — Telehealth: Payer: Self-pay

## 2020-10-04 LAB — CYTOLOGY - PAP
Adequacy: ABSENT
Comment: NEGATIVE
Diagnosis: NEGATIVE
High risk HPV: NEGATIVE

## 2020-10-04 NOTE — Telephone Encounter (Signed)
Patient informed negative Pap/HPV results. Patient verbalized understanding.

## 2020-11-18 ENCOUNTER — Other Ambulatory Visit: Payer: Self-pay | Admitting: Family Medicine

## 2020-11-18 DIAGNOSIS — L732 Hidradenitis suppurativa: Secondary | ICD-10-CM

## 2020-11-18 MED FILL — DOXYCYCLINE HYCLATE 100 MG: 100 | 30 days supply | Qty: 60 | Fill #1

## 2020-11-18 MED FILL — CYCLOBENZAPRINE 10 MG TAB: 10 | 10 days supply | Qty: 30 | Fill #1

## 2020-11-18 NOTE — Telephone Encounter (Signed)
Requested medication (s) are due for refill today: yes  Requested medication (s) are on the active medication list: no  Last refill: 10/01/19  Future visit scheduled: no  Notes to clinic:  not on med list; d/c'd 09/22/19 by Dr Antony Blackbird    Requested Prescriptions  Pending Prescriptions Disp Refills   clindamycin (CLINDAGEL) 1 % gel [Pharmacy Med Name: CLINDAMYCIN PH 1% GEL 1 Gel] 60 g 3    Sig: Apply topically 2 (two) times daily. To affected skin areas where boils occur      There is no refill protocol information for this order

## 2020-11-21 ENCOUNTER — Other Ambulatory Visit: Payer: Self-pay | Admitting: Internal Medicine

## 2020-12-30 ENCOUNTER — Other Ambulatory Visit: Payer: Self-pay

## 2020-12-30 MED FILL — Doxycycline Hyclate Tab 100 MG: ORAL | 30 days supply | Qty: 60 | Fill #0 | Status: AC

## 2020-12-30 MED FILL — Cyclobenzaprine HCl Tab 10 MG: ORAL | 10 days supply | Qty: 30 | Fill #0 | Status: AC

## 2021-01-02 ENCOUNTER — Other Ambulatory Visit: Payer: Self-pay

## 2021-01-31 ENCOUNTER — Ambulatory Visit (HOSPITAL_COMMUNITY)
Admission: EM | Admit: 2021-01-31 | Discharge: 2021-01-31 | Disposition: A | Discharge status: Home / Self Care | Payer: No Typology Code available for payment source | Attending: Family Medicine | Admitting: Family Medicine

## 2021-01-31 ENCOUNTER — Encounter (HOSPITAL_COMMUNITY): Payer: Self-pay | Admitting: *Deleted

## 2021-01-31 ENCOUNTER — Other Ambulatory Visit: Payer: Self-pay

## 2021-01-31 DIAGNOSIS — H6122 Impacted cerumen, left ear: Secondary | ICD-10-CM

## 2021-01-31 DIAGNOSIS — L02412 Cutaneous abscess of left axilla: Secondary | ICD-10-CM

## 2021-01-31 MED ORDER — CEPHALEXIN 500 MG PO CAPS
500.0000 mg | ORAL_CAPSULE | Freq: Two times a day (BID) | ORAL | 0 refills | Status: DC
Start: 2021-01-31 — End: 2021-10-23
  Filled 2021-01-31 – 2021-03-08 (×2): qty 14, 7d supply, fill #0

## 2021-01-31 MED ORDER — HIBICLENS 4 % EX LIQD
Freq: Every day | CUTANEOUS | 0 refills | Status: DC | PRN
  Filled 2021-01-31: qty 120, fill #0

## 2021-01-31 NOTE — ED Triage Notes (Signed)
Pt reports scuba diving and a recent plane flight . Pt Lt ear pain. Pt also has abscess Lt axilla

## 2021-01-31 NOTE — ED Provider Notes (Signed)
[facility]    CSN: [id number] Arrival date & time: 01/31/21  1755      History   Chief Complaint Chief Complaint  Patient presents with  . Otalgia    Lt  . Abscess    HPI Samantha [name] is a 45 y.o. female.   Patient presenting today with 2-day history of left ear pain, muffled hearing, fullness.  She states she recently traveled via plane from vacation when this all started.  Denies drainage from the ear, bleeding, fever, chills, history of ear infections, recent illness.  Has not been trying anything over-the-counter for symptoms so far.  She also has a chronic left axillary abscess and states [place] the ocean [place] this became irritated and started draining foul-smelling discharge.  She has been trying to keep it clean but thinks it is infected.  History of hidradenitis.     Past Medical History:  Diagnosis Date  . Anxiety    Phreesia 08/25/2020  . Asthma   . Back pain   . 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 Depression    Phreesia 08/25/2020  . Diabetes mellitus without complication ([place])    Phreesia 08/25/2020  . Hydradenitis   . Hyperlipidemia   . TIA (transient ischemic attack) 04/2015   questionable, most likely complicated migraine    Patient Active Problem List   Diagnosis Date Noted  . Pneumothorax 02/08/2020  . GIB (gastrointestinal bleeding) 10/13/2018  . Amenorrhea 09/17/2017  . Adjustment disorder with mixed anxiety and depressed mood 06/26/2016  . Depressed mood 06/26/2016  . Back pain 01/31/2016  . 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 Acute pericarditis 12/26/2015  . Hidradenitis suppurativa 11/07/2015  . 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 Sinusitis 10/20/2015  . Complicated migraine 35/36/1443  . Marijuana use 10/20/2015  . Smoker 05/04/2015  . TIA (transient ischemic attack) 05/04/2015  . Palpitations 05/04/2015  . Decreased appetite 05/04/2015  . 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 Hyperlipidemia  04/22/2015  . TOA (tubo-ovarian abscess) 11/23/2012  . History of abnormal Pap smear 12/20/2011  . History of PID 12/20/2011    Past Surgical History:  Procedure Laterality Date  . CERVICAL BIOPSY  W/ LOOP ELECTRODE EXCISION    . ESOPHAGOGASTRODUODENOSCOPY (EGD) WITH PROPOFOL N/A 10/14/2018   Procedure: ESOPHAGOGASTRODUODENOSCOPY (EGD) WITH PROPOFOL;  Surgeon: Wilford Corner, MD;  Location: [facility] ENDOSCOPY;  Service: Endoscopy;  Laterality: N/A;  . LEEP      OB History    Gravida  2   Para  1   Term  1   Preterm      AB  1   Living  1     SAB  1   IAB      Ectopic      Multiple      Live Births               Home Medications    Prior to Admission medications   Medication Sig Start Date End Date Taking? Authorizing Provider  cephALEXin (KEFLEX) 500 MG capsule Take 1 capsule (500 mg total) by mouth 2 (two) times daily. 01/31/21  Yes Volney American, PA-C  chlorhexidine (HIBICLENS) 4 % external liquid Apply topically daily as needed. 01/31/21  Yes Volney American, PA-C  clindamycin (CLINDAGEL) 1 % gel APPLY TOPICALLY 2 (TWO) TIMES DAILY. TO AFFECTED SKIN AREAS WHERE BOILS OCCUR 11/21/20   Nicolette Bang, DO  clindamycin (CLINDAGEL) 1 % gel APPLY TOPICALLY 2 (TWO) TIMES DAILY.  TO AFFECTED SKIN AREAS WHERE BOILS OCCUR 11/21/20 11/21/21  Nicolette Bang, DO  cyclobenzaprine (FLEXERIL) 10 MG tablet Take 1 tablet (10 mg total) by mouth 3 (three) times daily as needed. 08/25/20   Nicolette Bang, DO  cyclobenzaprine (FLEXERIL) 10 MG tablet TAKE 1 TABLET (10 MG TOTAL) BY MOUTH 3 (THREE) TIMES DAILY AS NEEDED. 08/25/20 08/25/21  Nicolette Bang, DO  doxycycline (VIBRA-TABS) 100 MG tablet Take 1 tablet (100 mg total) by mouth 2 (two) times daily. 08/25/20   Nicolette Bang, DO  doxycycline (VIBRA-TABS) 100 MG tablet TAKE 1 TABLET (100 MG TOTAL) BY MOUTH 2 (TWO) TIMES DAILY. 08/25/20 08/25/21  Nicolette Bang, DO  doxycycline (VIBRA-TABS) 100 MG tablet TAKE 1 TABLET BY MOUTH TWICE DAILY. 03/24/20 03/24/21  Nicolette Bang, DO  escitalopram (LEXAPRO) 10 MG tablet Take 1 tablet (10 mg total) by mouth at bedtime. 09/21/19   Fulp, Cammie, MD  gabapentin (NEURONTIN) 300 MG capsule Take 1 capsule (300 mg total) by mouth 3 (three) times daily as needed. Patient taking differently: Take 300 mg by mouth 3 (three) times daily as needed (for pain). 08/10/19   Fulp, Cammie, MD  metFORMIN (GLUCOPHAGE) 500 MG tablet Take 1 tablet (500 mg total) by mouth 2 (two) times daily with a meal. 08/10/19   Fulp, Cammie, MD    Family History Family History  Problem Relation Age of Onset  . Hypertension Mother   . Lupus Mother   . Diabetes Father   . Thyroid disease Sister   . Diabetes Sister   . Cancer Sister        stomach  . Diabetes Maternal Grandmother   . Anesthesia problems Neg Hx   . Other Neg Hx   . Migraines Neg Hx     Social History Social History   Tobacco Use  . Smoking status: Former Smoker    Packs/day: 1.00    Years: 17.00    Pack years: 17.00    Types: Cigars  . Smokeless tobacco: Never Used  . Tobacco comment: quit 02/08/2020  Vaping Use  . Vaping Use: Some days  Substance Use Topics  . Alcohol use: Yes    Alcohol/week: 1.0 standard drink    Types: 1 Shots of liquor per week    Comment: drink on the weekends  . Drug use: Not Currently    Frequency: 3.0 times per week    Types: Marijuana     Allergies   Patient has no known allergies.   Review of Systems Review of Systems Per HPI  Physical Exam Triage Vital Signs ED Triage Vitals  Enc Vitals Group     BP 01/31/21 1819 109/76     Pulse Rate 01/31/21 1819 79     Resp 01/31/21 1819 16     Temp 01/31/21 1819 99.3 F (37.4 C)     Temp src --      SpO2 01/31/21 1819 100 %     Weight --      Height --      Head Circumference --      Peak Flow --      Pain Score 01/31/21 1816 9     Pain Loc --       Pain Edu? --      Excl. in [place]? --    No data found.  Updated Vital Signs BP 109/76   Pulse 79   Temp 99.3 F (37.4 C)   Resp 16   LMP  01/17/2021   SpO2 100%   Visual Acuity Right Eye Distance:   Left Eye Distance:   Bilateral Distance:    Right Eye Near:   Left Eye Near:    Bilateral Near:     Physical Exam Vitals and nursing note reviewed.  Constitutional:      Appearance: Normal appearance. She is not ill-appearing.  HENT:     Head: Atraumatic.     Right Ear: Tympanic membrane normal.     Left Ear: There is impacted cerumen.     Mouth/Throat:     Mouth: Mucous membranes are moist.     Pharynx: Oropharynx is clear.  Eyes:     Extraocular Movements: Extraocular movements intact.     Conjunctiva/sclera: Conjunctivae normal.  Cardiovascular:     Rate and Rhythm: Normal rate and regular rhythm.     Heart sounds: Normal heart sounds.  Pulmonary:     Effort: Pulmonary effort is normal.     Breath sounds: Normal breath sounds. No wheezing or rales.  Musculoskeletal:        General: Normal range of motion.     Cervical back: Normal range of motion and neck supple.  Skin:    General: Skin is warm and dry.     Comments: Several areas of scarring, inflammation in left axilla from chronic hidradenitis changes.  Area of active purulent drainage with mild erythema surrounding.  Mild fluctuance to palpation  Neurological:     Mental Status: She is alert and oriented to person, place, and time.  Psychiatric:        Mood and Affect: Mood normal.        Thought Content: Thought content normal.        Judgment: Judgment normal.      UC Treatments / Results  Labs (all labs ordered are listed, but only abnormal results are displayed) Labs Reviewed - No data to display  EKG   Radiology No results found.  Procedures Procedures (including critical care time)  Medications Ordered in UC Medications - No data to display  Initial Impression / Assessment and Plan / UC  Course  I have reviewed the triage vital signs and the nursing notes.  Pertinent labs & imaging results that were available during my care of the patient were reviewed by me and considered in my medical decision making (see chart for details).     Ear lavage performed today to left ear with excellent benefit and no complication.  Wax was fully removed with symptomatic relief.  TM visualized and benign post procedure.  We will also start Keflex and Hibiclens for acute on chronic left axillary abscess.  Follow-up for acutely worsening symptoms.  Final Clinical Impressions(s) / UC Diagnoses   Final diagnoses:  Impacted cerumen of left ear  Abscess of left axilla   Discharge Instructions   None    ED Prescriptions    Medication Sig Dispense Auth. Provider   cephALEXin (KEFLEX) 500 MG capsule Take 1 capsule (500 mg total) by mouth 2 (two) times daily. 14 capsule Volney American, [place]   chlorhexidine (HIBICLENS) 4 % external liquid Apply topically daily as needed. 120 mL Volney American, [place]     PDMP not reviewed this encounter.   Volney American, [place] 01/31/21 1929

## 2021-02-01 ENCOUNTER — Other Ambulatory Visit: Payer: Self-pay

## 2021-02-08 ENCOUNTER — Other Ambulatory Visit: Payer: Self-pay

## 2021-03-08 ENCOUNTER — Other Ambulatory Visit: Payer: Self-pay

## 2021-03-08 ENCOUNTER — Ambulatory Visit: Payer: Self-pay | Admitting: *Deleted

## 2021-03-08 MED FILL — Clindamycin Phosphate Gel 1%: CUTANEOUS | 30 days supply | Qty: 60 | Fill #0 | Status: AC

## 2021-03-08 NOTE — Patient Instructions (Signed)
Patient was treated for this concern on 01/31/21 via UC. Patient was not aware of antibiotics and cleaning solution being at the pharmacy for pick up. Patient will begin treatment and FU with MMU if symptoms do not resolve.

## 2021-03-08 NOTE — Progress Notes (Signed)
Patient presents with boil under  L underarm with recurring. Patient has a history of this recurrent skin condition. Patient has eaten today. Patient has not taken medication today. Patient scales pain at the site being a 9 puss is brown with an odor.

## 2021-03-23 NOTE — Progress Notes (Signed)
Patient ID: Samantha [name], female    DOB: 09-28-1975  MRN: [id number]  CC: Boils  Subjective: Samantha [name] is a 45 y.o. female who presents for boils.   Her concerns today include:   BOILS FOLLOW-UP: 08/25/2020 per DO note: - doxycycline (VIBRA-TABS) 100 MG tablet; Take 1 tablet (100 mg total) by mouth 2 (two) times daily.  Dispense: 180 tablet; Refill: 0  03/24/2021: Doxycycline helping. Concern that occurrences are worsening and becoming more frequent. Reports in the past she would have 1 boil. More recently having multiple boils and painful. Drainage with odor and having to wash clothes more often than usual because of staining clothes underneath arms. Symptoms located underneath both armpits.  2. NUMBNESS: Bilateral arms especially during nighttime. Denies repetitive use of arms/hands. Radiates to bilateral shoulders and neck area.   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 14 capsule 0   chlorhexidine (HIBICLENS) 4 % external  liquid Apply topically daily as needed. 120 mL 0   clindamycin (CLINDAGEL) 1 % gel APPLY TOPICALLY 2 (TWO) TIMES DAILY. TO AFFECTED SKIN AREAS WHERE BOILS OCCUR 60 g 3   clindamycin (CLINDAGEL) 1 % gel APPLY TOPICALLY 2 (TWO) TIMES DAILY. TO AFFECTED SKIN AREAS WHERE BOILS OCCUR (Patient not taking: Reported on 03/08/2021) 60 g 3   cyclobenzaprine (FLEXERIL) 10 MG tablet Take 1 tablet (10 mg total) by mouth 3 (three) times daily as needed. (Patient not taking: Reported on 03/08/2021) 30 tablet 2   cyclobenzaprine (FLEXERIL) 10 MG tablet TAKE 1 TABLET (10 MG TOTAL) BY MOUTH 3 (THREE) TIMES DAILY AS NEEDED. (Patient not taking: Reported on 03/08/2021) 30 tablet 2   escitalopram (LEXAPRO) 10 MG tablet Take 1 tablet (10 mg total) by mouth at bedtime. 90 tablet 0   gabapentin (NEURONTIN) 300 MG capsule Take 1 capsule (300 mg total) by mouth 3 (three) times daily as needed. (Patient taking differently: Take 300 mg by mouth 3 (three) times daily as needed (for pain).) 90 capsule 3   metFORMIN (GLUCOPHAGE) 500 MG tablet Take 1 tablet (500 mg total) by mouth 2 (two) times daily with a meal. 180 tablet 3   No current facility-administered medications on file prior to visit.    No Known Allergies  Social History   Socioeconomic History   Marital status: Single    Spouse name: Not on file   Number of children: 1   Years of education: 9   Highest education level: 10th grade  Occupational History   Not on file  Tobacco Use   Smoking status: Former  Packs/day: 1.00    Years: 17.00    Pack years: 17.00    Types: Cigars, Cigarettes   Smokeless tobacco: Never   Tobacco comments:    quit 02/08/2020  Vaping Use   Vaping Use: Some days  Substance and Sexual Activity   Alcohol use: Yes    Alcohol/week: 1.0 standard drink    Types: 1 Shots of liquor per week    Comment: drink on the weekends   Drug use: Not Currently    Frequency: 3.0 times per week    Types: Marijuana   Sexual activity: Not  Currently    Birth control/protection: None  Other Topics Concern   Not on file  Social History Narrative   Patient drinks sodas all day long.   Patient is right handed.    Social Determinants of Health   Financial Resource Strain: Not on file  Food Insecurity: Not on file  Transportation Needs: No Transportation Needs   Lack of Transportation (Medical): No   Lack of Transportation (Non-Medical): No  Physical Activity: Not on file  Stress: Not on file  Social Connections: Not on file  Intimate Partner Violence: Not on file    Family History  Problem Relation Age of Onset   Hypertension Mother    Lupus Mother    Diabetes Father    Thyroid disease Sister    Diabetes Sister    Cancer Sister        stomach   Diabetes Maternal Grandmother    Anesthesia problems Neg Hx    Other Neg Hx    Migraines Neg Hx     Past Surgical History:  Procedure Laterality Date   CERVICAL BIOPSY  W/ LOOP ELECTRODE EXCISION     ESOPHAGOGASTRODUODENOSCOPY (EGD) WITH PROPOFOL N/A 10/14/2018   Procedure: ESOPHAGOGASTRODUODENOSCOPY (EGD) WITH PROPOFOL;  Surgeon: Wilford Corner, MD;  Location: [facility];  Service: Endoscopy;  Laterality: N/A;   LEEP      ROS: Review of Systems Negative except as stated above  PHYSICAL EXAM: BP 103/81 (BP Location: Left Arm, Patient Position: Sitting, Cuff Size: Normal)   Pulse 87   Temp 97.9 F (36.6 C)   Ht 5' 4.02" (1.626 m)   Wt 179 lb (81.2 kg)   SpO2 96%   BMI 30.71 kg/m   Physical Exam HENT:     Head: Normocephalic and atraumatic.  Eyes:     Extraocular Movements: Extraocular movements intact.     Conjunctiva/sclera: Conjunctivae normal.     Pupils: Pupils are equal, round, and reactive to light.  Cardiovascular:     Rate and Rhythm: Normal rate and regular rhythm.     Pulses: Normal pulses.     Heart sounds: Normal heart sounds.  Pulmonary:     Effort: Pulmonary effort is normal.     Breath sounds: Normal breath sounds.   Musculoskeletal:     Cervical back: Normal range of motion and neck supple.  Skin:    Comments: Patient declined exam.  Neurological:     General: No focal deficit present.     Mental Status: She is alert and oriented to person, place, and time.  Psychiatric:        Mood and Affect: Mood normal.        Behavior: Behavior normal.   ASSESSMENT AND PLAN: 1. Hidradenitis suppurativa: - Doxycycline as prescribed.  - Referral to Dermatology for further evaluation and management.  - doxycycline (VIBRA-TABS) 100 MG tablet; Take 1 tablet (100 mg total) by mouth 2 (  two) times daily.  Dispense: 180 tablet; Refill: 0 - Ambulatory referral to Dermatology  2. Chronic left shoulder pain: 3. Chronic right shoulder pain: 4. Cervical radiculopathy: - Referral to Orthopedic Surgery for further evaluation and management. - Ambulatory referral to Orthopedic Surgery  5. Numbness and tingling in both hands: - Gabapentin as prescribed.  - Follow-up with primary provider in 4 weeks or sooner if needed. - gabapentin (NEURONTIN) 100 MG capsule; Take 1 capsule (100 mg total) by mouth 3 (three) times daily.  Dispense: 90 capsule; Refill: 0  6. Financial difficulties: - Keep appointment for financial counseling [facility] financial discount/orange card application on 8/41/2820.   Patient was given the opportunity to ask questions.  Patient verbalized understanding of the plan and was able to repeat key elements of the plan. Patient was given clear instructions to go to Emergency Department or return to medical center if symptoms don't improve, worsen, or new problems develop.The patient verbalized understanding.   Orders Placed This Encounter  Procedures   Ambulatory referral to Orthopedic Surgery   Ambulatory referral to Dermatology    Requested Prescriptions   Signed Prescriptions Disp Refills   doxycycline (VIBRA-TABS) 100 MG tablet 180 tablet 0    Sig: Take 1 tablet (100 mg total) by mouth 2  (two) times daily.   gabapentin (NEURONTIN) 100 MG capsule 90 capsule 0    Sig: Take 1 capsule (100 mg total) by mouth 3 (three) times daily.    Follow-up with primary provider as scheduled.   Camillia Herter, NP

## 2021-03-24 ENCOUNTER — Encounter: Payer: Self-pay | Admitting: Family

## 2021-03-24 ENCOUNTER — Other Ambulatory Visit: Payer: Self-pay

## 2021-03-24 ENCOUNTER — Other Ambulatory Visit (HOSPITAL_COMMUNITY): Payer: Self-pay

## 2021-03-24 ENCOUNTER — Ambulatory Visit (INDEPENDENT_AMBULATORY_CARE_PROVIDER_SITE_OTHER): Payer: Self-pay | Admitting: Family

## 2021-03-24 VITALS — BP 103/81 | HR 87 | Temp 97.9°F | Ht 64.02 in | Wt 179.0 lb

## 2021-03-24 DIAGNOSIS — Z599 Problem related to housing and economic circumstances, unspecified: Secondary | ICD-10-CM

## 2021-03-24 DIAGNOSIS — R202 Paresthesia of skin: Secondary | ICD-10-CM

## 2021-03-24 DIAGNOSIS — R200 Anesthesia of skin: Secondary | ICD-10-CM

## 2021-03-24 DIAGNOSIS — M25512 Pain in left shoulder: Secondary | ICD-10-CM

## 2021-03-24 DIAGNOSIS — M5412 Radiculopathy, cervical region: Secondary | ICD-10-CM

## 2021-03-24 DIAGNOSIS — L732 Hidradenitis suppurativa: Secondary | ICD-10-CM

## 2021-03-24 DIAGNOSIS — M25511 Pain in right shoulder: Secondary | ICD-10-CM

## 2021-03-24 DIAGNOSIS — G8929 Other chronic pain: Secondary | ICD-10-CM

## 2021-03-24 MED ORDER — DOXYCYCLINE HYCLATE 100 MG PO TABS
100.0000 mg | ORAL_TABLET | Freq: Two times a day (BID) | ORAL | 0 refills | Status: DC
  Filled 2021-03-24: qty 180, 90d supply, fill #0
  Filled 2021-03-30: qty 60, 30d supply, fill #0

## 2021-03-24 MED ORDER — GABAPENTIN 100 MG PO CAPS
100.0000 mg | ORAL_CAPSULE | Freq: Three times a day (TID) | ORAL | 0 refills | Status: DC
  Filled 2021-03-24 – 2021-03-30 (×2): qty 90, 30d supply, fill #0

## 2021-03-24 NOTE — Progress Notes (Signed)
Pt presents for boils in axilla of both left and right arm, symptoms include pain, some pus coming out of boil on left arm

## 2021-03-29 ENCOUNTER — Other Ambulatory Visit: Payer: Self-pay

## 2021-03-30 ENCOUNTER — Other Ambulatory Visit: Payer: Self-pay | Admitting: Internal Medicine

## 2021-03-30 ENCOUNTER — Other Ambulatory Visit: Payer: Self-pay

## 2021-03-30 ENCOUNTER — Other Ambulatory Visit (HOSPITAL_COMMUNITY): Payer: Self-pay

## 2021-04-03 ENCOUNTER — Other Ambulatory Visit: Payer: Self-pay

## 2021-04-04 ENCOUNTER — Ambulatory Visit: Payer: No Typology Code available for payment source

## 2021-06-01 ENCOUNTER — Emergency Department (HOSPITAL_COMMUNITY): Payer: No Typology Code available for payment source

## 2021-06-01 ENCOUNTER — Encounter (HOSPITAL_COMMUNITY): Payer: Self-pay

## 2021-06-01 ENCOUNTER — Emergency Department (HOSPITAL_COMMUNITY)
Admission: EM | Admit: 2021-06-01 | Discharge: 2021-06-01 | Disposition: A | Discharge status: Home / Self Care | Payer: Self-pay | Attending: Emergency Medicine | Admitting: Emergency Medicine

## 2021-06-01 ENCOUNTER — Other Ambulatory Visit: Payer: Self-pay

## 2021-06-01 DIAGNOSIS — M79641 Pain in right hand: Secondary | ICD-10-CM | POA: Insufficient documentation

## 2021-06-01 DIAGNOSIS — Z7984 Long term (current) use of oral hypoglycemic drugs: Secondary | ICD-10-CM | POA: Insufficient documentation

## 2021-06-01 DIAGNOSIS — Z87891 Personal history of nicotine dependence: Secondary | ICD-10-CM | POA: Insufficient documentation

## 2021-06-01 DIAGNOSIS — J45909 Unspecified asthma, uncomplicated: Secondary | ICD-10-CM | POA: Insufficient documentation

## 2021-06-01 DIAGNOSIS — E119 Type 2 diabetes mellitus without complications: Secondary | ICD-10-CM | POA: Insufficient documentation

## 2021-06-01 NOTE — Discharge Instructions (Signed)
You came to the emergency department today to be evaluated for your right hand pain.  Your x-ray imaging showed no broken bones or dislocations.  Please use ice 20 minutes on with a 20-minute break to help decrease your swelling.  Please use ibuprofen and Tylenol as noted below to help with your pain and swelling.  Please follow-up with hand specialist on this paperwork.  Please use your wrist braces nightly to help with your pins and needle sensation.  Please take Ibuprofen (Advil, motrin) and Tylenol (acetaminophen) to relieve your pain.    You may take up to 600 MG (3 pills) of normal strength ibuprofen every 8 hours as needed.   You make take tylenol, up to 1,000 mg (two extra strength pills) every 8 hours as needed.   It is safe to take ibuprofen and tylenol at the same time as they work differently.   Do not take more than 3,000 mg tylenol in a 24 hour period (not more than one dose every 8 hours.  Please check all medication labels as many medications such as pain and cold medications may contain tylenol.  Do not drink alcohol while taking these medications.  Do not take other NSAID'S while taking ibuprofen (such as aleve or naproxen).  Please take ibuprofen with food to decrease stomach upset.  Get help right away if: Your hand becomes warm, red, or swollen. Your hand is numb or tingling. Your hand is extremely swollen or deformed. Your hand or fingers turn white or blue. You cannot move your hand, wrist, or fingers.

## 2021-06-01 NOTE — ED Notes (Signed)
Pt ambulatory in ED lobby. 

## 2021-06-01 NOTE — ED Triage Notes (Signed)
Pt reports right hand pain and swelling over the past 2 days. Denies injury. Pt reports intermittent hand and arm numbness on both sides. She reports being told that she might have carpal tunnel.

## 2021-06-01 NOTE — ED Provider Notes (Signed)
[facility] DEPT Provider Note   CSN: [id number] Arrival date & time: 06/01/21  0277     History Chief Complaint  Patient presents with   Hand Pain    Samantha [name] is a 45 y.o. female with medical history as noted below.  Presents to the emergency department with a chief complaint of pain and swelling to right hand.  The symptom has been present over the last 2 days.  Pain has been constant.  Patient rates pain 10/10 on pain scale.  Patient reports that pain radiates into her forearm .  Pain is worse with movement or touch.  Patient has had minimal relief with over-the-counter pain medication.  Patient denies any recent injuries.  Patient denies any numbness, weakness, color change, wounds, fever, chills, rash.  Additionally patient complains of pins and needle sensation to bilateral arms when waking in the middle of the night and in the morning.  Patient reports that she has previous been told she has carpal tunnel syndrome.       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   Depression    [employer]rees[employer] 08/25/2020   Diabetes mellitus without complication ([place])    [employer]reesia 08/25/2020   Hydradenitis    Hyperlipidemia    TIA (transient ischemic attack) 04/2015   questionable, most likely complicated migraine    Patient Active Problem List   Diagnosis Date Noted   Pneumothorax 02/08/2020   GIB (gastrointestinal bleeding) 10/13/2018   Amenorrhea 09/17/2017   Adjustment disorder with mixed anxiety and depressed mood 06/26/2016   Depressed mood 06/26/2016   Back pain 01/31/2016   Caffeine abuse, 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   Acute pericarditis 12/26/2015   Hidradenitis suppurativa 11/07/2015   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   Sinusitis 41/28/7867   Complicated migraine 67/20/9470   Marijuana use 10/20/2015   Smoker 05/04/2015   TIA (transient  ischemic attack) 05/04/2015   Palpitations 05/04/2015   Decreased appetite 05/04/2015   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[place]) 04/22/2015   Hyperlipidemia 04/22/2015   TOA (tubo-ovarian abscess) 11/23/2012   History of abnormal Pap smear 12/20/2011   History of PID 12/20/2011    Past Surgical History:  Procedure Laterality Date   CERVICAL BIOPSY  W/ LOOP ELECTRODE EXCISION     ESOPHAGOGASTRODUODENOSCOPY (EGD) WITH PROPOFOL N/A 10/14/2018   Procedure: ESOPHAGOGASTRODUODENOSCOPY (EGD) WITH PROPOFOL;  Surgeon: Wilford Corner, MD;  Location: [facility];  Service: Endoscopy;  Laterality: N/A;   LEEP       OB History     Gravida  2   Para  1   Term  1   Preterm      AB  1   Living  1      SAB  1   IAB      Ectopic      Multiple      Live Births              Family History  Problem Relation Age of Onset   Hypertension Mother    Lupus Mother    Diabetes Father    Thyroid disease Sister    Diabetes Sister    Cancer Sister        stomach   Diabetes Maternal Grandmother    Anesthesia problems Neg Hx    Other Neg Hx    Migraines Neg  Hx     Social History   Tobacco Use   Smoking status: Former    Packs/day: 1.00    Years: 17.00    Pack years: 17.00    Types: Cigars, Cigarettes   Smokeless tobacco: Never   Tobacco comments:    quit 02/08/2020  Vaping Use   Vaping Use: Some days  Substance Use Topics   Alcohol use: Yes    Alcohol/week: 1.0 standard drink    Types: 1 Shots of liquor per week    Comment: drink on the weekends   Drug use: Not Currently    Frequency: 3.0 times per week    Types: Marijuana    Home Medications Prior to Admission medications   Medication Sig Start Date End Date Taking? Authorizing Provider  cephALEXin (KEFLEX) 500 MG capsule Take 1 capsule (500 mg total) by mouth 2 (two) times daily. 01/31/21   Volney American, PA-C  chlorhexidine (HIBICLENS) 4 % external liquid Apply topically daily as needed. 01/31/21   Volney American, PA-C  clindamycin (CLINDAGEL) 1 % gel APPLY TOPICALLY 2 (TWO) TIMES DAILY. TO AFFECTED SKIN AREAS WHERE BOILS OCCUR 11/21/20   Nicolette Bang, MD  clindamycin (CLINDAGEL) 1 % gel APPLY TOPICALLY 2 (TWO) TIMES DAILY. TO AFFECTED SKIN AREAS WHERE BOILS OCCUR Patient not taking: Reported on 03/08/2021 11/21/20 11/21/21  Nicolette Bang, MD  cyclobenzaprine (FLEXERIL) 10 MG tablet Take 1 tablet (10 mg total) by mouth 3 (three) times daily as needed. Patient not taking: Reported on 03/08/2021 08/25/20   Nicolette Bang, MD  cyclobenzaprine (FLEXERIL) 10 MG tablet TAKE 1 TABLET (10 MG TOTAL) BY MOUTH 3 (THREE) TIMES DAILY AS NEEDED. Patient not taking: Reported on 03/08/2021 08/25/20 08/25/21  Nicolette Bang, MD  doxycycline (VIBRA-TABS) 100 MG tablet Take 1 tablet (100 mg total) by mouth 2 (two) times daily. 03/24/21   Camillia Herter, NP  escitalopram (LEXAPRO) 10 MG tablet Take 1 tablet (10 mg total) by mouth at bedtime. 09/21/19   Fulp, Cammie, MD  gabapentin (NEURONTIN) 100 MG capsule Take 1 capsule (100 mg total) by mouth 3 (three) times daily. 03/24/21   Camillia Herter, NP  gabapentin (NEURONTIN) 300 MG capsule Take 1 capsule (300 mg total) by mouth 3 (three) times daily as needed. Patient taking differently: Take 300 mg by mouth 3 (three) times daily as needed (for pain). 08/10/19   Fulp, Cammie, MD  metFORMIN (GLUCOPHAGE) 500 MG tablet Take 1 tablet (500 mg total) by mouth 2 (two) times daily with a meal. 08/10/19   Fulp, Cammie, MD    Allergies    Patient has no known allergies.  Review of Systems   Review of Systems  Constitutional:  Negative for chills and fever.  Musculoskeletal:  Positive for arthralgias and myalgias. Negative for back pain and neck pain.  Skin:  Negative for color change, pallor, rash and wound.  Neurological:  Negative for weakness and numbness.   Physical Exam Updated Vital Signs BP (!) 124/99 (BP Location: Left Arm)    Pulse 86   Temp 98.7 F (37.1 C) (Oral)   Resp 18   Ht 5\' 4"  (1.626 m)   Wt 81.6 kg   SpO2 100%   BMI 30.90 kg/m   Physical Exam Vitals and nursing note reviewed.  Constitutional:      General: She is not in acute distress.    Appearance: She is not ill-appearing, toxic-appearing or diaphoretic.  HENT:     Head:  Normocephalic.  Eyes:     General: No scleral icterus.       Right eye: No discharge.        Left eye: No discharge.  Cardiovascular:     Rate and Rhythm: Normal rate.     Pulses:          Radial pulses are 2+ on the right side and 2+ on the left side.  Pulmonary:     Effort: Pulmonary effort is normal.  Musculoskeletal:     Right elbow: Normal.     Right forearm: Normal.     Left forearm: Normal.     Right wrist: Normal.     Left wrist: Normal.     Right hand: Swelling and tenderness present. No deformity, lacerations or bony tenderness. Decreased range of motion. Normal sensation. Normal capillary refill.     Left hand: No swelling, deformity, lacerations, tenderness or bony tenderness. Normal range of motion. Normal sensation. Normal capillary refill.     Comments: Patient has swelling to dorsum of right hand below second and third MCP joint.  Patient has tenderness to palpation.  No warmth or erythema noted.  Skin:    General: Skin is warm and dry.  Neurological:     General: No focal deficit present.     Mental Status: She is alert and oriented to person, place, and time.     GCS: GCS eye subscore is 4. GCS verbal subscore is 5. GCS motor subscore is 6.  Psychiatric:        Behavior: Behavior is cooperative.    ED Results / Procedures / Treatments   Labs (all labs ordered are listed, but only abnormal results are displayed) Labs Reviewed - No data to display  EKG None  Radiology DG Hand Complete Right  Result Date: 06/01/2021 CLINICAL DATA:  Hand pain starting 2 days ago without known injury. EXAM: RIGHT HAND - COMPLETE 3+ VIEW COMPARISON:   None. FINDINGS: There is no evidence of fracture or dislocation. There is no evidence of arthropathy or other focal bone abnormality. Soft tissues are unremarkable. IMPRESSION: Negative. Electronically Signed   By: Van Clines M.D.   On: 06/01/2021 12:25    Procedures Procedures   Medications Ordered in ED Medications - No data to display  ED Course  I have reviewed the triage vital signs and the nursing notes.  Pertinent labs & imaging results that were available during my care of the patient were reviewed by me and considered in my medical decision making (see chart for details).    MDM Rules/Calculators/A&P                            Alert 45 year old female no acute distress, nontoxic-appearing.  Presents to the emergency department with a chief complaint of pain and swelling to right hand.  Symptoms have been present over the last 2 days.  On physical exam patient has swelling and tenderness to dorsum of right hand below MCP joint of second and third digit.  No overlying warmth, rash, or erythema.  Sensation and cap refill intact to all digits of right hand.  Patient has decreased range of motion to second, third, and fourth digit secondary to complaints of pain.  X-ray imaging obtained shows no acute fracture or dislocation.  Low suspicion for abscess or septic arthritis.  Additionally patient complains of pins and needle sensation to bilateral arms when waking from sleep.  Reports that she  has previously been told she might have carpal tunnel syndrome.  Patient has no numbness to bilateral upper extremities on physical exam.  Will give patient braces to wear nightly due to her complains of pins-and-needles sensation.  Patient advised to use over-the-counter pain medication and frequent ice.  Patient will follow up with hand specialist Dr. Fredna Dow in outpatient setting.  Discussed results, findings, treatment and follow up. Patient advised of return precautions. Patient  verbalized understanding and agreed with plan.     Final Clinical Impression(s) / ED Diagnoses Final diagnoses:  None    Rx / DC Orders ED Discharge Orders     None        Dyann Ruddle 06/01/21 1755    Arnaldo Natal, MD 06/01/21 2242

## 2021-07-18 ENCOUNTER — Other Ambulatory Visit: Payer: Self-pay

## 2021-07-18 ENCOUNTER — Ambulatory Visit
Admission: EM | Admit: 2021-07-18 | Discharge: 2021-07-18 | Disposition: A | Discharge status: Home / Self Care | Payer: No Typology Code available for payment source | Attending: Physician Assistant | Admitting: Physician Assistant

## 2021-07-18 DIAGNOSIS — N898 Other specified noninflammatory disorders of vagina: Secondary | ICD-10-CM

## 2021-07-18 DIAGNOSIS — Z113 Encounter for screening for infections with a predominantly sexual mode of transmission: Secondary | ICD-10-CM | POA: Insufficient documentation

## 2021-07-18 DIAGNOSIS — N939 Abnormal uterine and vaginal bleeding, unspecified: Secondary | ICD-10-CM

## 2021-07-18 LAB — POCT URINALYSIS DIP (MANUAL ENTRY)
Bilirubin, UA: NEGATIVE
Glucose, UA: NEGATIVE mg/dL
Leukocytes, UA: NEGATIVE
Nitrite, UA: NEGATIVE
Protein Ur, POC: NEGATIVE mg/dL
Spec Grav, UA: 1.025 (ref 1.010–1.025)
Urobilinogen, UA: 0.2 E.U./dL
pH, UA: 6.5 (ref 5.0–8.0)

## 2021-07-18 NOTE — ED Provider Notes (Signed)
[facility]    CSN: [id number] Arrival date & time: 07/18/21  1742      History   Chief Complaint Chief Complaint  Patient presents with   vaginal odor   possible sti exposure    HPI Samantha [name] is a 45 y.o. female.   Patient here today for evaluation of vaginal odor and discharge.  She denies any other symptoms.  She has not had any abdominal pain, nausea or vomiting.  She denies any known STI exposure but would like screening for same.    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   Depression    Phreesia 08/25/2020   Diabetes mellitus without complication ([place])    Phreesia 08/25/2020   Hydradenitis    Hyperlipidemia    TIA (transient ischemic attack) 04/2015   questionable, most likely complicated migraine    Patient Active Problem List   Diagnosis Date Noted   Pneumothorax 02/08/2020   GIB (gastrointestinal bleeding) 10/13/2018   Amenorrhea 09/17/2017   Adjustment disorder with mixed anxiety and depressed mood 06/26/2016   Depressed mood 06/26/2016   Back pain 01/31/2016   Caffeine abuse, 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   Acute pericarditis 12/26/2015   Hidradenitis suppurativa 11/07/2015   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   Sinusitis 28/31/5176   Complicated migraine 16/03/3709   Marijuana use 10/20/2015   Smoker 05/04/2015   TIA (transient ischemic attack) 05/04/2015   Palpitations 05/04/2015   Decreased appetite 05/04/2015   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[place]) 04/22/2015   Hyperlipidemia 04/22/2015   TOA (tubo-ovarian abscess) 11/23/2012   History of abnormal Pap smear 12/20/2011   History of PID 12/20/2011    Past Surgical History:  Procedure Laterality Date   CERVICAL BIOPSY  W/ LOOP ELECTRODE EXCISION     ESOPHAGOGASTRODUODENOSCOPY (EGD) WITH PROPOFOL N/A 10/14/2018   Procedure: ESOPHAGOGASTRODUODENOSCOPY (EGD) WITH PROPOFOL;  Surgeon: Wilford Corner, MD;   Location: [facility];  Service: Endoscopy;  Laterality: N/A;   LEEP      OB History     Gravida  2   Para  1   Term  1   Preterm      AB  1   Living  1      SAB  1   IAB      Ectopic      Multiple      Live Births               Home Medications    Prior to Admission medications   Medication Sig Start Date End Date Taking? Authorizing Provider  cephALEXin (KEFLEX) 500 MG capsule Take 1 capsule (500 mg total) by mouth 2 (two) times daily. 01/31/21   Volney American, PA-C  chlorhexidine (HIBICLENS) 4 % external liquid Apply topically daily as needed. 01/31/21   Volney American, PA-C  clindamycin (CLINDAGEL) 1 % gel APPLY TOPICALLY 2 (TWO) TIMES DAILY. TO AFFECTED SKIN AREAS WHERE BOILS OCCUR 11/21/20   Nicolette Bang, MD  clindamycin (CLINDAGEL) 1 % gel APPLY TOPICALLY 2 (TWO) TIMES DAILY. TO AFFECTED SKIN AREAS WHERE BOILS OCCUR Patient not taking: Reported on 03/08/2021 11/21/20 11/21/21  Nicolette Bang, MD  cyclobenzaprine (FLEXERIL) 10 MG tablet Take 1 tablet (10 mg total) by mouth 3 (three) times daily as needed. Patient not taking: Reported on 03/08/2021 08/25/20   Nicolette Bang, MD  cyclobenzaprine (FLEXERIL) 10 MG  tablet TAKE 1 TABLET (10 MG TOTAL) BY MOUTH 3 (THREE) TIMES DAILY AS NEEDED. Patient not taking: Reported on 03/08/2021 08/25/20 08/25/21  Nicolette Bang, MD  doxycycline (VIBRA-TABS) 100 MG tablet Take 1 tablet (100 mg total) by mouth 2 (two) times daily. 03/24/21   Camillia Herter, NP  escitalopram (LEXAPRO) 10 MG tablet Take 1 tablet (10 mg total) by mouth at bedtime. 09/21/19   Fulp, Cammie, MD  gabapentin (NEURONTIN) 100 MG capsule Take 1 capsule (100 mg total) by mouth 3 (three) times daily. 03/24/21   Camillia Herter, NP  gabapentin (NEURONTIN) 300 MG capsule Take 1 capsule (300 mg total) by mouth 3 (three) times daily as needed. Patient taking differently: Take 300 mg by mouth 3 (three) times  daily as needed (for pain). 08/10/19   Fulp, Cammie, MD  metFORMIN (GLUCOPHAGE) 500 MG tablet Take 1 tablet (500 mg total) by mouth 2 (two) times daily with a meal. 08/10/19   Fulp, Cammie, MD    Family History Family History  Problem Relation Age of Onset   Hypertension Mother    Lupus Mother    Diabetes Father    Thyroid disease Sister    Diabetes Sister    Cancer Sister        stomach   Diabetes Maternal Grandmother    Anesthesia problems Neg Hx    Other Neg Hx    Migraines Neg Hx     Social History Social History   Tobacco Use   Smoking status: Former    Packs/day: 1.00    Years: 17.00    Pack years: 17.00    Types: Cigars, Cigarettes   Smokeless tobacco: Never   Tobacco comments:    quit 02/08/2020  Vaping Use   Vaping Use: Some days  Substance Use Topics   Alcohol use: Yes    Alcohol/week: 1.0 standard drink    Types: 1 Shots of liquor per week    Comment: drink on the weekends   Drug use: Not Currently    Frequency: 3.0 times per week    Types: Marijuana     Allergies   Patient has no known allergies.   Review of Systems Review of Systems  Constitutional:  Negative for chills and fever.  Eyes:  Negative for discharge and redness.  Respiratory:  Negative for shortness of breath.   Gastrointestinal:  Negative for abdominal pain, nausea and vomiting.  Genitourinary:  Positive for vaginal bleeding and vaginal discharge. Negative for genital sores.    Physical Exam Triage Vital Signs ED Triage Vitals [07/18/21 1844]  Enc Vitals Group     BP 130/80     Pulse Rate 75     Resp 18     Temp 97.9 F (36.6 C)     Temp Source Oral     SpO2 99 %     Weight      Height      Head Circumference      Peak Flow      Pain Score 0     Pain Loc      Pain Edu?      Excl. in [place]?    No data found.  Updated Vital Signs BP 130/80 (BP Location: Right Arm)   Pulse 75   Temp 97.9 F (36.6 C) (Oral)   Resp 18   LMP 06/14/2021 (Approximate)   SpO2 99%    Physical Exam Vitals and nursing note reviewed.  Constitutional:  General: She is not in acute distress.    Appearance: Normal appearance. She is not ill-appearing.  HENT:     Head: Normocephalic and atraumatic.  Eyes:     Conjunctiva/sclera: Conjunctivae normal.  Cardiovascular:     Rate and Rhythm: Normal rate.  Pulmonary:     Effort: Pulmonary effort is normal.  Neurological:     Mental Status: She is alert.  Psychiatric:        Mood and Affect: Mood normal.        Behavior: Behavior normal.        Thought Content: Thought content normal.     UC Treatments / Results  Labs (all labs ordered are listed, but only abnormal results are displayed) Labs Reviewed  POCT URINALYSIS DIP (MANUAL ENTRY) - Abnormal; Notable for the following components:      Result Value   Ketones, POC UA trace (5) (*)    Blood, UA trace-intact (*)    All other components within normal limits  CERVICOVAGINAL ANCILLARY ONLY    EKG   Radiology No results found.  Procedures Procedures (including critical care time)  Medications Ordered in UC Medications - No data to display  Initial Impression / Assessment and Plan / UC Course  I have reviewed the triage vital signs and the nursing notes.  Pertinent labs & imaging results that were available during my care of the patient were reviewed by me and considered in my medical decision making (see chart for details).    STD screening ordered as requested.  Will await results further recommendation.  Recommended she check her MyChart for further results.  Final Clinical Impressions(s) / UC Diagnoses   Final diagnoses:  Screening for STD (sexually transmitted disease)   Discharge Instructions   None    ED Prescriptions   None    PDMP not reviewed this encounter.   Francene Finders, PA-C 07/18/21 1935

## 2021-07-18 NOTE — ED Triage Notes (Signed)
Pt c/o vaginal odor, more moisture in vagina than usual.   Denies, burning, itching, frequency, oliguria, pelvic pain, lower back pain, hematuria.   Onset 2-3 days ago

## 2021-07-18 NOTE — ED Notes (Signed)
Also states bf recently was with another women and wants to proactively check for sti

## 2021-07-20 ENCOUNTER — Telehealth (HOSPITAL_COMMUNITY): Payer: Self-pay | Admitting: Emergency Medicine

## 2021-07-20 ENCOUNTER — Other Ambulatory Visit: Payer: Self-pay

## 2021-07-20 ENCOUNTER — Other Ambulatory Visit: Payer: Self-pay | Admitting: Internal Medicine

## 2021-07-20 DIAGNOSIS — L732 Hidradenitis suppurativa: Secondary | ICD-10-CM

## 2021-07-20 LAB — CERVICOVAGINAL ANCILLARY ONLY
Bacterial Vaginitis (gardnerella): POSITIVE — AB
Candida Glabrata: NEGATIVE
Candida Vaginitis: NEGATIVE
Chlamydia: NEGATIVE
Comment: NEGATIVE
Comment: NEGATIVE
Comment: NEGATIVE
Comment: NEGATIVE
Comment: NEGATIVE
Comment: NORMAL
Neisseria Gonorrhea: NEGATIVE
Trichomonas: NEGATIVE

## 2021-07-20 MED ORDER — METRONIDAZOLE 500 MG PO TABS
500.0000 mg | ORAL_TABLET | Freq: Two times a day (BID) | ORAL | 0 refills | Status: DC
  Filled 2021-07-20: qty 14, 7d supply, fill #0

## 2021-07-20 NOTE — Telephone Encounter (Signed)
   Notes to clinic Is this pt associated with your practice, no PCP listed, this rx was previously written by Durene Fruits.  Requested Prescriptions  Pending Prescriptions Disp Refills   doxycycline (VIBRA-TABS) 100 MG tablet 180 tablet 0    Sig: Take 1 tablet (100 mg total) by mouth 2 (two) times daily.     Off-Protocol Failed - 07/20/2021  3:52 PM      Failed - Medication not assigned to a protocol, review manually.      Failed - Valid encounter within last 12 months    Recent Outpatient Visits           1 year ago Lipoma of scalp   [place] Judeth Horn, MD

## 2021-07-20 NOTE — Telephone Encounter (Signed)
Medication Refill - Medication:  doxycycline (VIBRA-TABS) 100 MG tablet   Has the patient contacted their pharmacy? Yes.   Contact PCP  Preferred Pharmacy (with phone number or street name):  [address], [place] [address]  Phone:  [phone]  Fax:  [phone]   Has the patient been seen for an appointment in the last year OR does the patient have an upcoming appointment? Yes.    Agent: Please be advised that RX refills may take up to 3 business days. We ask that you follow-up with your pharmacy.

## 2021-07-21 ENCOUNTER — Telehealth: Payer: Self-pay

## 2021-07-21 MED ORDER — DOXYCYCLINE HYCLATE 100 MG PO TABS
100.0000 mg | ORAL_TABLET | Freq: Two times a day (BID) | ORAL | 0 refills | Status: AC
  Filled 2021-07-21: qty 60, 30d supply, fill #0

## 2021-07-21 NOTE — Telephone Encounter (Signed)
Doxycycline (Vibra-Tabs) refilled per patient request. Please schedule appointment for additional refills, last appointment 03/24/2021. Please note patient was also referred to Dermatology at that time. See referral coordinator note below:   Patient No showed to her appointment with Financial  04/04/2021 need OC

## 2021-07-21 NOTE — Telephone Encounter (Signed)
Doxycycline (Vibra-Tabs) refilled per patient request. Please schedule appointment for additional refills, last appointment 03/24/2021. Please note patient was also referred to Dermatology at that time. See referral coordinator note below:             Patient No showed to her appointment with Financial  04/04/2021 need OC

## 2021-07-21 NOTE — Telephone Encounter (Signed)
Pt called in stating she is having severe back pain and is requesting a refill on her cyclobenzaprine, advised the pt I would send a note inquiring if she needs an appt to have this medication refilled Pts last appt was on 03/24/21

## 2021-07-22 ENCOUNTER — Other Ambulatory Visit: Payer: Self-pay

## 2021-07-24 ENCOUNTER — Other Ambulatory Visit: Payer: Self-pay

## 2021-09-28 ENCOUNTER — Encounter: Payer: Self-pay | Admitting: *Deleted

## 2021-10-02 ENCOUNTER — Encounter: Payer: Self-pay | Admitting: Emergency Medicine

## 2021-10-02 ENCOUNTER — Ambulatory Visit
Admission: EM | Admit: 2021-10-02 | Discharge: 2021-10-02 | Disposition: A | Discharge status: Home / Self Care | Payer: No Typology Code available for payment source | Attending: Internal Medicine | Admitting: Internal Medicine

## 2021-10-02 ENCOUNTER — Other Ambulatory Visit: Payer: Self-pay

## 2021-10-02 DIAGNOSIS — K047 Periapical abscess without sinus: Secondary | ICD-10-CM

## 2021-10-02 DIAGNOSIS — K0889 Other specified disorders of teeth and supporting structures: Secondary | ICD-10-CM

## 2021-10-02 MED ORDER — LIDOCAINE VISCOUS HCL 2 % MT SOLN
15.0000 mL | OROMUCOSAL | 0 refills | Status: DC | PRN
  Filled 2021-10-02: qty 100, 6d supply, fill #0

## 2021-10-02 MED ORDER — CLINDAMYCIN HCL 150 MG PO CAPS
450.0000 mg | ORAL_CAPSULE | Freq: Three times a day (TID) | ORAL | 0 refills | Status: AC
  Filled 2021-10-02: qty 63, 7d supply, fill #0

## 2021-10-02 NOTE — Discharge Instructions (Addendum)
You have been prescribed antibiotic to treat dental infection.  A lidocaine solution is also prescribed to help alleviate pain.  Please follow-up with dentist as soon as possible for further evaluation and management.

## 2021-10-02 NOTE — ED Triage Notes (Signed)
Lower left jaw pain/aching since last week she believes may be a tooth ache

## 2021-10-02 NOTE — ED Provider Notes (Signed)
[facility]    CSN: [id number] Arrival date & time: 10/02/21  1721      History   Chief Complaint Chief Complaint  Patient presents with   Dental Problem    HPI Samantha [name] is a 46 y.o. female.   Patient presents with left lower jaw pain and upper tooth pain that started approximately 1 week ago.  Denies any injury to the area.  Denies any fevers or purulent drainage from the area.    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   Depression    Phreesia 08/25/2020   Diabetes mellitus without complication ([place])    Phreesia 08/25/2020   Hydradenitis    Hyperlipidemia    TIA (transient ischemic attack) 04/2015   questionable, most likely complicated migraine    Patient Active Problem List   Diagnosis Date Noted   Pneumothorax 02/08/2020   GIB (gastrointestinal bleeding) 10/13/2018   Amenorrhea 09/17/2017   Adjustment disorder with mixed anxiety and depressed mood 06/26/2016   Depressed mood 06/26/2016   Back pain 01/31/2016   Caffeine abuse, 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   Acute pericarditis 12/26/2015   Hidradenitis suppurativa 11/07/2015   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   Sinusitis 76/16/0737   Complicated migraine 10/62/6948   Marijuana use 10/20/2015   Smoker 05/04/2015   TIA (transient ischemic attack) 05/04/2015   Palpitations 05/04/2015   Decreased appetite 05/04/2015   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[place]) 04/22/2015   Hyperlipidemia 04/22/2015   TOA (tubo-ovarian abscess) 11/23/2012   History of abnormal Pap smear 12/20/2011   History of PID 12/20/2011    Past Surgical History:  Procedure Laterality Date   CERVICAL BIOPSY  W/ LOOP ELECTRODE EXCISION     ESOPHAGOGASTRODUODENOSCOPY (EGD) WITH PROPOFOL N/A 10/14/2018   Procedure: ESOPHAGOGASTRODUODENOSCOPY (EGD) WITH PROPOFOL;  Surgeon: Wilford Corner, MD;  Location: [facility];  Service: Endoscopy;  Laterality:  N/A;   LEEP      OB History     Gravida  2   Para  1   Term  1   Preterm      AB  1   Living  1      SAB  1   IAB      Ectopic      Multiple      Live Births               Home Medications    Prior to Admission medications   Medication Sig Start Date End Date Taking? Authorizing Provider  cephALEXin (KEFLEX) 500 MG capsule Take 1 capsule (500 mg total) by mouth 2 (two) times daily. 01/31/21   Volney American, PA-C  chlorhexidine (HIBICLENS) 4 % external liquid Apply topically daily as needed. 01/31/21   Volney American, PA-C  clindamycin (CLEOCIN) 150 MG capsule Take 3 capsules (450 mg total) by mouth 3 (three) times daily for 7 days. 10/02/21 10/09/21 Yes Takeo Harts, Michele Rockers, FNP  clindamycin (CLINDAGEL) 1 % gel APPLY TOPICALLY 2 (TWO) TIMES DAILY. TO AFFECTED SKIN AREAS WHERE BOILS OCCUR 11/21/20   Nicolette Bang, MD  clindamycin (CLINDAGEL) 1 % gel APPLY TOPICALLY 2 (TWO) TIMES DAILY. TO AFFECTED SKIN AREAS WHERE BOILS OCCUR Patient not taking: Reported on 03/08/2021 11/21/20 11/21/21  Nicolette Bang, MD  cyclobenzaprine (FLEXERIL) 10 MG tablet Take 1 tablet (10 mg total) by mouth 3 (three) times daily as needed. Patient  not taking: Reported on 03/08/2021 08/25/20   Nicolette Bang, MD  escitalopram (LEXAPRO) 10 MG tablet Take 1 tablet (10 mg total) by mouth at bedtime. 09/21/19   Fulp, Cammie, MD  gabapentin (NEURONTIN) 100 MG capsule Take 1 capsule (100 mg total) by mouth 3 (three) times daily. 03/24/21   Camillia Herter, NP  gabapentin (NEURONTIN) 300 MG capsule Take 1 capsule (300 mg total) by mouth 3 (three) times daily as needed. Patient taking differently: Take 300 mg by mouth 3 (three) times daily as needed (for pain). 08/10/19   Fulp, Cammie, MD  lidocaine (XYLOCAINE) 2 % solution Use as directed 15 mLs in the mouth or throat as needed for mouth pain. Swish and spit 10/02/21  Yes Markham Dumlao, [place], [place]  metFORMIN (GLUCOPHAGE)  500 MG tablet Take 1 tablet (500 mg total) by mouth 2 (two) times daily with a meal. 08/10/19   Fulp, Cammie, MD  metroNIDAZOLE (FLAGYL) 500 MG tablet Take 1 tablet (500 mg total) by mouth 2 (two) times daily. 07/20/21   Lamptey, Myrene Galas, MD    Family History Family History  Problem Relation Age of Onset   Hypertension Mother    Lupus Mother    Diabetes Father    Thyroid disease Sister    Diabetes Sister    Cancer Sister        stomach   Diabetes Maternal Grandmother    Anesthesia problems Neg Hx    Other Neg Hx    Migraines Neg Hx     Social History Social History   Tobacco Use   Smoking status: Former    Packs/day: 1.00    Years: 17.00    Pack years: 17.00    Types: Cigars, Cigarettes   Smokeless tobacco: Never   Tobacco comments:    quit 02/08/2020  Vaping Use   Vaping Use: Some days  Substance Use Topics   Alcohol use: Yes    Alcohol/week: 1.0 standard drink    Types: 1 Shots of liquor per week    Comment: drink on the weekends   Drug use: Not Currently    Frequency: 3.0 times per week    Types: Marijuana     Allergies   Patient has no known allergies.   Review of Systems Review of Systems Per HPI  Physical Exam Triage Vital Signs ED Triage Vitals [10/02/21 1809]  Enc Vitals Group     BP 122/85     Pulse Rate 71     Resp 16     Temp 98.5 F (36.9 C)     Temp Source Oral     SpO2 97 %     Weight      Height      Head Circumference      Peak Flow      Pain Score 9     Pain Loc      Pain Edu?      Excl. in [place]?    No data found.  Updated Vital Signs BP 122/85 (BP Location: Left Arm)    Pulse 71    Temp 98.5 F (36.9 C) (Oral)    Resp 16    SpO2 97%   Visual Acuity Right Eye Distance:   Left Eye Distance:   Bilateral Distance:    Right Eye Near:   Left Eye Near:    Bilateral Near:     Physical Exam Constitutional:      General: She is not in acute distress.  Appearance: Normal appearance. She is not toxic-appearing or  diaphoretic.  HENT:     Head: Normocephalic and atraumatic.     Mouth/Throat:     Lips: Pink.     Dentition: Dental tenderness and gingival swelling present.     Comments: Patient does have gingival swelling with associated dental tenderness located to left lower back teeth as well as left upper teeth. Eyes:     Extraocular Movements: Extraocular movements intact.     Conjunctiva/sclera: Conjunctivae normal.  Pulmonary:     Effort: Pulmonary effort is normal.  Neurological:     General: No focal deficit present.     Mental Status: She is alert and oriented to person, place, and time. Mental status is at baseline.  Psychiatric:        Mood and Affect: Mood normal.        Behavior: Behavior normal.        Thought Content: Thought content normal.        Judgment: Judgment normal.     UC Treatments / Results  Labs (all labs ordered are listed, but only abnormal results are displayed) Labs Reviewed - No data to display  EKG   Radiology No results found.  Procedures Procedures (including critical care time)  Medications Ordered in UC Medications - No data to display  Initial Impression / Assessment and Plan / UC Course  I have reviewed the triage vital signs and the nursing notes.  Pertinent labs & imaging results that were available during my care of the patient were reviewed by me and considered in my medical decision making (see chart for details).     Will treat dental infection with clindamycin.  Viscous lidocaine also prescribed to help alleviate pain.  Discussed supportive care.  Patient will need to follow-up with dentist for further evaluation and management.  Patient verbalized understanding was agreeable with plan. Final Clinical Impressions(s) / UC Diagnoses   Final diagnoses:  Dental infection  Pain, dental     Discharge Instructions      You have been prescribed antibiotic to treat dental infection.  A lidocaine solution is also prescribed to help  alleviate pain.  Please follow-up with dentist as soon as possible for further evaluation and management.    ED Prescriptions     Medication Sig Dispense Auth. Provider   clindamycin (CLEOCIN) 150 MG capsule Take 3 capsules (450 mg total) by mouth 3 (three) times daily for 7 days. 63 capsule [place], [place] [facility] [place] [facility], [place]   lidocaine (XYLOCAINE) 2 % solution Use as directed 15 mLs in the mouth or throat as needed for mouth pain. Swish and spit 100 mL Teodora Medici, [place]      PDMP not reviewed this encounter.   Teodora Medici, [place] 10/02/21 1827

## 2021-10-03 ENCOUNTER — Other Ambulatory Visit: Payer: Self-pay

## 2021-10-03 MED FILL — Clindamycin Phosphate Gel 1%: CUTANEOUS | 30 days supply | Qty: 60 | Fill #0 | Status: CN

## 2021-10-04 ENCOUNTER — Other Ambulatory Visit: Payer: Self-pay

## 2021-10-11 ENCOUNTER — Other Ambulatory Visit: Payer: Self-pay

## 2021-10-19 ENCOUNTER — Other Ambulatory Visit: Payer: Self-pay

## 2021-10-19 MED FILL — Clindamycin Phosphate Gel 1%: CUTANEOUS | 30 days supply | Qty: 60 | Fill #0 | Status: AC

## 2021-10-23 ENCOUNTER — Encounter (HOSPITAL_COMMUNITY): Payer: Self-pay

## 2021-10-23 ENCOUNTER — Ambulatory Visit (HOSPITAL_COMMUNITY)
Admission: EM | Admit: 2021-10-23 | Discharge: 2021-10-23 | Disposition: A | Discharge status: Home / Self Care | Payer: No Typology Code available for payment source | Attending: Emergency Medicine | Admitting: Emergency Medicine

## 2021-10-23 ENCOUNTER — Other Ambulatory Visit: Payer: Self-pay

## 2021-10-23 DIAGNOSIS — N939 Abnormal uterine and vaginal bleeding, unspecified: Secondary | ICD-10-CM | POA: Insufficient documentation

## 2021-10-23 MED ORDER — METRONIDAZOLE 500 MG PO TABS
500.0000 mg | ORAL_TABLET | Freq: Two times a day (BID) | ORAL | 0 refills | Status: DC
  Filled 2021-10-23: qty 14, 7d supply, fill #0

## 2021-10-23 NOTE — ED Provider Notes (Signed)
[place]    CSN: [id number] Arrival date & time: 10/23/21  3382      History   Chief Complaint Chief Complaint  Patient presents with   Vaginal Bleeding    HPI Samantha [name] is a 46 y.o. female.   Patient presents with spotting with brownish discharge for 5 days beginning after last period.  Menstruation was described as normal lasting from 2/03-2/07.  Endorses sharp abdominal pain occurring once last night, resolved after use of Tylenol.  Endorses that over.  She went swimming and placed 2 tampons into the vaginal canal, unsure if 1 tampon is still in place, has attempted to check herself.  Denies vaginal discharge, itching, irritation, odor, flank pain, urinary frequency, urgency, hematuria, dysuria, fever, chills.  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   Depression    Phreesia 08/25/2020   Diabetes mellitus without complication ([place])    Phreesia 08/25/2020   Hydradenitis    Hyperlipidemia    TIA (transient ischemic attack) 04/2015   questionable, most likely complicated migraine    Patient Active Problem List   Diagnosis Date Noted   Pneumothorax 02/08/2020   GIB (gastrointestinal bleeding) 10/13/2018   Amenorrhea 09/17/2017   Adjustment disorder with mixed anxiety and depressed mood 06/26/2016   Depressed mood 06/26/2016   Back pain 01/31/2016   Caffeine abuse, 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   Acute pericarditis 12/26/2015   Hidradenitis suppurativa 11/07/2015   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   Sinusitis 50/53/9767   Complicated migraine 34/19/3790   Marijuana use 10/20/2015   Smoker 05/04/2015   TIA (transient ischemic attack) 05/04/2015   Palpitations 05/04/2015   Decreased appetite 05/04/2015   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[place]) 04/22/2015   Hyperlipidemia 04/22/2015   TOA (tubo-ovarian abscess) 11/23/2012   History of abnormal Pap smear 12/20/2011   History of  PID 12/20/2011    Past Surgical History:  Procedure Laterality Date   CERVICAL BIOPSY  W/ LOOP ELECTRODE EXCISION     ESOPHAGOGASTRODUODENOSCOPY (EGD) WITH PROPOFOL N/A 10/14/2018   Procedure: ESOPHAGOGASTRODUODENOSCOPY (EGD) WITH PROPOFOL;  Surgeon: Wilford Corner, MD;  Location: [facility];  Service: Endoscopy;  Laterality: N/A;   LEEP      OB History     Gravida  2   Para  1   Term  1   Preterm      AB  1   Living  1      SAB  1   IAB      Ectopic      Multiple      Live Births               Home Medications    Prior to Admission medications   Medication Sig Start Date End Date Taking? Authorizing Provider  cephALEXin (KEFLEX) 500 MG capsule Take 1 capsule (500 mg total) by mouth 2 (two) times daily. 01/31/21   Volney American, PA-C  chlorhexidine (HIBICLENS) 4 % external liquid Apply topically daily as needed. 01/31/21   Volney American, PA-C  clindamycin (CLINDAGEL) 1 % gel APPLY TOPICALLY 2 (TWO) TIMES DAILY. TO AFFECTED SKIN AREAS WHERE BOILS OCCUR 11/21/20   Nicolette Bang, MD  clindamycin (CLINDAGEL) 1 % gel APPLY TOPICALLY 2 (TWO) TIMES DAILY. TO AFFECTED SKIN AREAS WHERE BOILS OCCUR Patient not taking: Reported on 03/08/2021 11/21/20 11/21/21  Nicolette Bang, MD  cyclobenzaprine [facility])  10 MG tablet Take 1 tablet (10 mg total) by mouth 3 (three) times daily as needed. Patient not taking: Reported on 03/08/2021 08/25/20   Nicolette Bang, MD  escitalopram (LEXAPRO) 10 MG tablet Take 1 tablet (10 mg total) by mouth at bedtime. 09/21/19   Fulp, Cammie, MD  gabapentin (NEURONTIN) 100 MG capsule Take 1 capsule (100 mg total) by mouth 3 (three) times daily. 03/24/21   Camillia Herter, NP  gabapentin (NEURONTIN) 300 MG capsule Take 1 capsule (300 mg total) by mouth 3 (three) times daily as needed. Patient taking differently: Take 300 mg by mouth 3 (three) times daily as needed (for pain). 08/10/19   Fulp, Cammie,  MD  lidocaine (XYLOCAINE) 2 % solution Use as directed 15 mLs in the mouth or throat as needed for mouth pain. Swish and spit 10/02/21   Teodora Medici, FNP  metFORMIN (GLUCOPHAGE) 500 MG tablet Take 1 tablet (500 mg total) by mouth 2 (two) times daily with a meal. 08/10/19   Fulp, Cammie, MD  metroNIDAZOLE (FLAGYL) 500 MG tablet Take 1 tablet (500 mg total) by mouth 2 (two) times daily. 07/20/21   Lamptey, Myrene Galas, MD    Family History Family History  Problem Relation Age of Onset   Hypertension Mother    Lupus Mother    Diabetes Father    Thyroid disease Sister    Diabetes Sister    Cancer Sister        stomach   Diabetes Maternal Grandmother    Anesthesia problems Neg Hx    Other Neg Hx    Migraines Neg Hx     Social History Social History   Tobacco Use   Smoking status: Former    Packs/day: 1.00    Years: 17.00    Pack years: 17.00    Types: Cigars, Cigarettes   Smokeless tobacco: Never   Tobacco comments:    quit 02/08/2020  Vaping Use   Vaping Use: Some days  Substance Use Topics   Alcohol use: Yes    Alcohol/week: 1.0 standard drink    Types: 1 Shots of liquor per week    Comment: drink on the weekends   Drug use: Not Currently    Frequency: 3.0 times per week    Types: Marijuana     Allergies   Patient has no known allergies.   Review of Systems Review of Systems  Constitutional: Negative.   HENT: Negative.    Respiratory: Negative.    Cardiovascular: Negative.   Gastrointestinal: Negative.   Genitourinary:  Positive for vaginal bleeding. Negative for decreased urine volume, difficulty urinating, dyspareunia, dysuria, enuresis, flank pain, frequency, genital sores, hematuria, menstrual problem, pelvic pain, urgency, vaginal discharge and vaginal pain.  Skin: Negative.     Physical Exam Triage Vital Signs ED Triage Vitals  Enc Vitals Group     BP 10/23/21 1046 137/83     Pulse Rate 10/23/21 1046 77     Resp 10/23/21 1046 18     Temp 10/23/21  1046 98.9 F (37.2 C)     Temp Source 10/23/21 1046 Oral     SpO2 10/23/21 1046 100 %     Weight --      Height --      Head Circumference --      Peak Flow --      Pain Score 10/23/21 1047 1     Pain Loc --      Pain Edu? --  Excl. in GC? --    No data found.  Updated Vital Signs BP 137/83 (BP Location: Right Arm)    Pulse 77    Temp 98.9 F (37.2 C) (Oral)    Resp 18    LMP 10/13/2021    SpO2 100%   Visual Acuity Right Eye Distance:   Left Eye Distance:   Bilateral Distance:    Right Eye Near:   Left Eye Near:    Bilateral Near:     Physical Exam Constitutional:      Appearance: Normal appearance.  HENT:     Head: Normocephalic.  Eyes:     Extraocular Movements: Extraocular movements intact.  Pulmonary:     Effort: Pulmonary effort is normal.  Genitourinary:    Exam position: Lithotomy position.     Cervix: Normal.     Comments: No foreign body noted in the vaginal canal, brown and Romy Mcgue thick discharge noted, no tenderness, lesions or rash noted Skin:    General: Skin is warm and dry.  Neurological:     Mental Status: She is alert and oriented to person, place, and time. Mental status is at baseline.  Psychiatric:        Mood and Affect: Mood normal.        Behavior: Behavior normal.     UC Treatments / Results  Labs (all labs ordered are listed, but only abnormal results are displayed) Labs Reviewed - No data to display  EKG   Radiology No results found.  Procedures Procedures (including critical care time)  Medications Ordered in UC Medications - No data to display  Initial Impression / Assessment and Plan / UC Course  I have reviewed the triage vital signs and the nursing notes.  Pertinent labs & imaging results that were available during my care of the patient were reviewed by me and considered in my medical decision making (see chart for details).  Abnormal vaginal bleeding  Unknown etiology of symptoms, possibly hormonal versus  infection, STI swab completed, labs pending, will begin treatment for metronidazole based on discharge noted during exam, advise discontinuation of any alcohol use while medication, advised abstinence until all lab results and/or treatment is complete, recommended follow-up with a gynecologist, walking referral given for persistent symptoms Final Clinical Impressions(s) / UC Diagnoses   Final diagnoses:  None   Discharge Instructions   None    ED Prescriptions   None    PDMP not reviewed this encounter.   Hans Eden, NP 10/23/21 1126

## 2021-10-23 NOTE — ED Triage Notes (Signed)
Pt presents with abnormal vaginal spotting since her period ended a few days ago.

## 2021-10-23 NOTE — Discharge Instructions (Signed)
I was able to notate the brownish discharge that you are describing during your exam  We have completed STI testing to rule out causes  I will begin treatment for bacterial vaginosis today, take metronidazole twice a day for the next 7 days, please do not use any alcohol while on this medicine as it would make you feel sick  If your symptoms continue to persist please follow-up with the [facility] to be seen by a gynecologist for further evaluation  Your STI labs are pending, you will be notified of any positive results, please refrain from having sex until all labs have returned

## 2021-10-24 LAB — CERVICOVAGINAL ANCILLARY ONLY
Bacterial Vaginitis (gardnerella): NEGATIVE
Candida Glabrata: NEGATIVE
Candida Vaginitis: NEGATIVE
Chlamydia: NEGATIVE
Comment: NEGATIVE
Comment: NEGATIVE
Comment: NEGATIVE
Comment: NEGATIVE
Comment: NEGATIVE
Comment: NORMAL
Neisseria Gonorrhea: NEGATIVE
Trichomonas: NEGATIVE

## 2021-10-30 ENCOUNTER — Other Ambulatory Visit: Payer: Self-pay

## 2022-01-03 ENCOUNTER — Other Ambulatory Visit: Payer: Self-pay

## 2022-01-03 DIAGNOSIS — N6310 Unspecified lump in the right breast, unspecified quadrant: Secondary | ICD-10-CM

## 2022-01-03 DIAGNOSIS — N644 Mastodynia: Secondary | ICD-10-CM

## 2022-02-20 ENCOUNTER — Other Ambulatory Visit: Payer: Self-pay

## 2022-02-20 MED ORDER — SULFAMETHOXAZOLE-TRIMETHOPRIM 800-160 MG PO TABS
1.0000 | ORAL_TABLET | Freq: Two times a day (BID) | ORAL | 0 refills | Status: DC
  Filled 2022-02-20: qty 20, 10d supply, fill #0

## 2022-02-23 ENCOUNTER — Other Ambulatory Visit: Payer: Self-pay

## 2022-02-23 ENCOUNTER — Other Ambulatory Visit: Payer: Self-pay | Admitting: Family Medicine

## 2022-02-23 DIAGNOSIS — L732 Hidradenitis suppurativa: Secondary | ICD-10-CM

## 2022-03-08 ENCOUNTER — Ambulatory Visit
Admission: RE | Admit: 2022-03-08 | Discharge: 2022-03-08 | Disposition: A | Discharge status: Home / Self Care | Payer: Self-pay | Source: Ambulatory Visit | Attending: Obstetrics and Gynecology | Admitting: Obstetrics and Gynecology

## 2022-03-08 ENCOUNTER — Ambulatory Visit
Admission: RE | Admit: 2022-03-08 | Discharge: 2022-03-08 | Disposition: A | Discharge status: Home / Self Care | Payer: No Typology Code available for payment source | Source: Ambulatory Visit | Attending: Obstetrics and Gynecology | Admitting: Obstetrics and Gynecology

## 2022-03-08 ENCOUNTER — Other Ambulatory Visit: Payer: Self-pay | Admitting: Obstetrics and Gynecology

## 2022-03-08 ENCOUNTER — Ambulatory Visit: Payer: No Typology Code available for payment source | Admitting: *Deleted

## 2022-03-08 VITALS — BP 118/70 | Wt 192.0 lb

## 2022-03-08 DIAGNOSIS — N6310 Unspecified lump in the right breast, unspecified quadrant: Secondary | ICD-10-CM

## 2022-03-08 DIAGNOSIS — Z1211 Encounter for screening for malignant neoplasm of colon: Secondary | ICD-10-CM

## 2022-03-08 DIAGNOSIS — N644 Mastodynia: Secondary | ICD-10-CM

## 2022-03-08 DIAGNOSIS — Z1239 Encounter for other screening for malignant neoplasm of breast: Secondary | ICD-10-CM

## 2022-03-08 DIAGNOSIS — R2231 Localized swelling, mass and lump, right upper limb: Secondary | ICD-10-CM

## 2022-03-08 DIAGNOSIS — N6322 Unspecified lump in the left breast, upper inner quadrant: Secondary | ICD-10-CM

## 2022-03-08 DIAGNOSIS — R2232 Localized swelling, mass and lump, left upper limb: Secondary | ICD-10-CM

## 2022-03-08 NOTE — Patient Instructions (Signed)
Explained breast self awareness with Samantha [name]. Patient did not need a Pap smear today due to last Pap smear and HPV typing was 02/20/2022 per patient. Let her know BCCCP will cover Pap smears and HPV typing every 5 years unless has a history of abnormal Pap smears. Referred patient to the [facility] for a diagnostic mammogram. Appointment scheduled Thursday, March 08, 2022 at [place]. Patient aware of appointment and will be there. [name] verbalized understanding.  Asencion Guisinger, Arvil Chaco, RN 8:58 AM

## 2022-03-08 NOTE — Progress Notes (Signed)
[name] is a 46 y.o. female who presents to [facility] clinic today with complaint of bilateral outer breast pain x 2 months that comes and goes. Patient rates the pain at a 8 out of 10.    Pap Smear: Pap smear not completed today. Last Pap smear was 02/20/2022 at the [facility] and normal with negative HPV per patient. Patients two previous Pap smears were 10/03/2020 at [facility] normal with negative HPV and 09/18/2017 at [facility] [place] normal with negative HPV. 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 Patient stated she had a LEEP after the colposcopy for follow-up. Patient stated all Pap smears have been normal since LEEP and has had at least three normal Pap smears. Last Pap smear is not available in Epic. Patients two previous Pap smear results are in Epic.   Physical exam: Breasts Breasts symmetrical. No skin abnormalities bilateral breasts. No nipple retraction bilateral breasts. No nipple discharge bilateral breasts. No lymphadenopathy. Palpated a lump within the left breast at 11 o'clock 1 cm the nipple. Palpated five left axillary lumps at 1 o'clock 18 cm from the nipple, 1:30 o'clock 21 cm from the nipple, 2 o'clock 17 cm from the nipple that has an opened sore on it, 2 o'clock 18 cm from the nipple, and 2 o'clock 19 cm from the nipple. Palpated a cluster of right axillary lumps at 10 o'clock 17 cm from the nipple. Complaints of bilateral axillary tenderness on exam.  MS DIGITAL DIAG TOMO BILAT  Result Date: 08/30/2020 CLINICAL DATA:  46 year old female for further evaluation of possible RIGHT breast masses identified on screening exam and RIGHT breast and bilateral axillary lumps on clinical examination. Patient is currently on antibiotics. History of hidradenitis suppurativa. EXAM: DIGITAL DIAGNOSTIC BILATERAL MAMMOGRAM WITH CAD AND TOMO ULTRASOUND RIGHT  BREAST ULTRASOUND LEFT AXILLA COMPARISON:  Previous exam(s). ACR Breast Density Category b: There are scattered areas of fibroglandular density. FINDINGS: 2D/3D spot compression views of both breasts are performed. Multiple circumscribed oval masses within the RIGHT breast are present. Prominent LEFT axillary lymph node is noted. Mammographic images were processed with CAD. Targeted ultrasound is performed, showing the following: RIGHT breast: Multiple benign simple cysts within the RIGHT breast. The largest cysts measures 2.9 cm at the 9 o'clock position 3 cm from the nipple and 3.7 cm at the 2 o'clock position 1 cm from the nipple. The cysts correspond to the mammographic findings. RIGHT axilla: Two complicated collections underlying the skin measuring 0.5 x 0.5 x 0.5 cm and 1.3 x 0.3 x 1 cm. No abnormal RIGHT axillary lymph nodes are noted. LEFT axilla: Three complicated collections underlying the skin with the largest measuring 2.2 x 0.4 x 1.2 cm. A single abnormal LEFT axillary lymph node with cortical thickening of 5 mm is noted. IMPRESSION: 1. Complicated collections within both axillary regions underlying the skin, compatible with history of hidradenitis suppurativa. The largest measures up to 2.2 cm on the LEFT side. 2. Single abnormal LEFT axillary lymph node. With associated findings, this is compatible with a reactive node. 3. Multiple benign cysts within the RIGHT breast, corresponding to the mammographic findings. RECOMMENDATION: Consider clinical follow-up as indicated. Any further workup should be based on clinical grounds. Bilateral screening mammogram in 1 year. I have discussed the findings and recommendations with the patient. If applicable, a reminder letter will be sent to the patient regarding the next  appointment. BI-RADS CATEGORY  2: Benign. Electronically Signed   By: Margarette Canada M.D.   On: 08/30/2020 14:14   MS DIGITAL SCREENING TOMO BILATERAL  Result Date: 08/26/2020 CLINICAL DATA:   Screening. EXAM: DIGITAL SCREENING BILATERAL MAMMOGRAM WITH TOMO AND CAD COMPARISON:  Previous exam(s). ACR Breast Density Category c: The breast tissue is heterogeneously dense, which may obscure small masses. FINDINGS: In the right breast, possible masses warrant further evaluation. In the left breast, no findings suspicious for malignancy. Images were processed with CAD. IMPRESSION: Further evaluation is suggested for possible masses in the right breast. RECOMMENDATION: Diagnostic mammogram and possibly ultrasound of the right breast. (Code:FI-R-19M) The patient will be contacted regarding the findings, and additional imaging will be scheduled. BI-RADS CATEGORY  0: Incomplete. Need additional imaging evaluation and/or prior mammograms for comparison. Electronically Signed   By: Lajean Manes M.D.   On: 08/26/2020 07:47   MS DIGITAL SCREENING TOMO BILATERAL  Result Date: 02/24/2019 CLINICAL DATA:  Screening. EXAM: DIGITAL SCREENING BILATERAL MAMMOGRAM WITH TOMO AND CAD COMPARISON:  Previous exam(s). ACR Breast Density Category c: The breast tissue is heterogeneously dense, which may obscure small masses. FINDINGS: There are no findings suspicious for malignancy. Images were processed with CAD. IMPRESSION: No mammographic evidence of malignancy. A result letter of this screening mammogram will be mailed directly to the patient. RECOMMENDATION: Screening mammogram in one year. (Code:SM-B-01Y) BI-RADS CATEGORY  1: Negative. Electronically Signed   By: Marin Olp M.D.   On: 02/24/2019 15:17   MM DIAG BREAST TOMO UNI LEFT  Result Date: 10/24/2017 CLINICAL DATA:  46 year old female recalled from baseline screening mammogram for a left breast mass. Patient also has left axillary skin thickening and palpable abnormalities, which she states has been going on for many years. EXAM: DIGITAL DIAGNOSTIC LEFT MAMMOGRAM WITH CAD AND TOMO ULTRASOUND LEFT BREAST COMPARISON:  Previous exam(s). ACR Breast Density Category  d: The breast tissue is extremely dense, which lowers the sensitivity of mammography. FINDINGS: A circumscribed equal density mass in the central lateral left breast pain at middle depth persists on today's additional views. No other suspicious mammographic findings are identified. Irregular skin thickening and subcutaneous masses are also demonstrated in the left axillary region. Mammographic images were processed with CAD. Targeted ultrasound is performed, showing a circumscribed anechoic cyst at the 9 o'clock position 3 cm from the nipple on the left. It measures 1.1 x 0.7 x 0.5 cm. There is no significant internal vascularity. This corresponds with the mammographic finding. Evaluation of the left axilla demonstrates numerous circumscribed, subcutaneous masses with mild associated skin thickening. IMPRESSION: 1. Benign left breast simple cyst corresponding with the screening mammographic findings. No further imaging follow-up required. 2. Multiple benign nodules contained entirely within the skin in the left axilla with associated skin thickening. The patient states this has been going on for many years and she was previously seeing a dermatologist for it. The findings may represent a process such as hidradenitis suppurativa. Recommendation is for continued clinical and symptomatic follow-up. RECOMMENDATION: 1.  Screening mammogram in one year.(Code:SM-B-01Y) 2. Clinical follow-up recommended for the area of concern in the left axilla. Any further imaging workup should be based on clinical grounds. I have discussed the findings and recommendations with the patient. Results were also provided in writing at the conclusion of the visit. If applicable, a reminder letter will be sent to the patient regarding the next appointment. BI-RADS CATEGORY  2: Benign. Electronically Signed   By: Shayne Alken.D.  On: 10/24/2017 10:18   MS DIGITAL SCREENING BILATERAL  Result Date: 10/03/2017 CLINICAL DATA:  Screening.  Baseline. EXAM: DIGITAL SCREENING BILATERAL MAMMOGRAM WITH CAD COMPARISON:  None. ACR Breast Density Category d: The breast tissue is extremely dense, which lowers the sensitivity of mammography. FINDINGS: In the left breast, a possible asymmetry warrants further evaluation. In the right breast, no findings suspicious for malignancy. Images were processed with CAD. IMPRESSION: Further evaluation is suggested for possible asymmetry in the left breast. RECOMMENDATION: 3D diagnostic mammogram and possibly ultrasound of the left breast. (Code:FI-L-61M) The patient will be contacted regarding the findings, and additional imaging will be scheduled. BI-RADS CATEGORY  0: Incomplete. Need additional imaging evaluation and/or prior mammograms for comparison. Electronically Signed   By: Evangeline Dakin M.D.   On: 10/03/2017 09:43    Pelvic/Bimanual Pap is not indicated today per BCCCP guidelines.   Smoking History: Patient is a former smoker that quit 02/07/2020. Patient stated she still vapes. Discussed smoking cessation and risks associated with vaping with patient. Referred to the [facility] Quitline and gave resources on the free smoking cessation classes at [facility].   Patient Navigation: Patient education provided. Access to services provided for patient through [place] program.   Colorectal Cancer Screening: Per patient has never had colonoscopy completed. FIT Test given to patient to complete. No complaints today.    Breast and Cervical Cancer Risk Assessment: Patient does not have family history of breast cancer, known genetic mutations, or radiation treatment to the chest before age 88. Patient has history of cervical dysplasia. Patient has no history of being immunocompromised or DES exposure in-utero.  Risk Assessment     Risk Scores       03/08/2022 08/16/2020   Last edited by: Demetrius Revel, LPN McGill, Sherie Mamie Nick, LPN   5-year risk: 0.9 % 0.8 %   Lifetime risk: 9.3 % 9.4 %             A: BCCCP exam without pap smear Complaint of bilateral outer breast pain.  P: Referred patient to the [facility] for a diagnostic mammogram. Appointment scheduled Thursday, March 08, 2022 at [place].  Loletta Parish, RN 03/08/2022 8:58 AM

## 2022-06-12 ENCOUNTER — Encounter (HOSPITAL_COMMUNITY): Payer: Self-pay

## 2022-06-12 ENCOUNTER — Ambulatory Visit (HOSPITAL_COMMUNITY)
Admission: EM | Admit: 2022-06-12 | Discharge: 2022-06-12 | Disposition: A | Discharge status: Home / Self Care | Payer: No Typology Code available for payment source | Attending: Family Medicine | Admitting: Family Medicine

## 2022-06-12 ENCOUNTER — Other Ambulatory Visit: Payer: Self-pay

## 2022-06-12 DIAGNOSIS — L732 Hidradenitis suppurativa: Secondary | ICD-10-CM

## 2022-06-12 DIAGNOSIS — J069 Acute upper respiratory infection, unspecified: Secondary | ICD-10-CM

## 2022-06-12 DIAGNOSIS — L02412 Cutaneous abscess of left axilla: Secondary | ICD-10-CM

## 2022-06-12 DIAGNOSIS — L02411 Cutaneous abscess of right axilla: Secondary | ICD-10-CM

## 2022-06-12 DIAGNOSIS — L0291 Cutaneous abscess, unspecified: Secondary | ICD-10-CM

## 2022-06-12 MED ORDER — BENZONATATE 100 MG PO CAPS
100.0000 mg | ORAL_CAPSULE | Freq: Three times a day (TID) | ORAL | 0 refills | Status: DC
  Filled 2022-06-12: qty 21, 7d supply, fill #0

## 2022-06-12 MED ORDER — AMOXICILLIN-POT CLAVULANATE 875-125 MG PO TABS
1.0000 | ORAL_TABLET | Freq: Two times a day (BID) | ORAL | 0 refills | Status: AC
  Filled 2022-06-12: qty 20, 10d supply, fill #0

## 2022-06-12 MED ORDER — ALBUTEROL SULFATE HFA 108 (90 BASE) MCG/ACT IN AERS
2.0000 | INHALATION_SPRAY | RESPIRATORY_TRACT | 0 refills | Status: AC | PRN
  Filled 2022-06-12: qty 6.7, 17d supply, fill #0

## 2022-06-12 NOTE — Discharge Instructions (Addendum)
You were seen today for upper respiratory symptoms as well as boils under the arms.  I have treated you with augmentin today to take twice/day x 10 day with food to avoid upset stomach.  I have sent out a cough pill and an inhaler as well.  If you have worsening symptoms despite treatment then please return for further evaluation.

## 2022-06-12 NOTE — ED Triage Notes (Signed)
Pt c/o abscess under both arms x2wks. Rt arm is starting to drain. States she is out of her cream. Taking tylenol for pain.  Pt c/o cough and chest congestion x2 days. States having rib pain from coughing.

## 2022-06-12 NOTE — ED Provider Notes (Signed)
[facility]    CSN: [id number] Arrival date & time: 06/12/22  1225      History   Chief Complaint Chief Complaint  Patient presents with   Abscess   Cough    HPI Samantha [name] is a 46 y.o. female.   Patient is here for several reasons.  Started with a sore throat, itchy throat.  She did vomit twice.  Then had a cough.  Mild wheezing.  Having pain in her chest with the cough.  No fevers/chills.  + congestion, mild runny nose.  She does have h/o asthma but does not have an inhaler at home.   She also has boils under both arms.  The one on the right is draining.  She was given a "gel" that she has been applying, but not sure this is helping.        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   Depression    Phreesia 08/25/2020   Diabetes mellitus without complication ([place])    Phreesia 08/25/2020   Hydradenitis    Hyperlipidemia    TIA (transient ischemic attack) 04/2015   questionable, most likely complicated migraine    Patient Active Problem List   Diagnosis Date Noted   Pneumothorax 02/08/2020   GIB (gastrointestinal bleeding) 10/13/2018   Amenorrhea 09/17/2017   Adjustment disorder with mixed anxiety and depressed mood 06/26/2016   Depressed mood 06/26/2016   Back pain 01/31/2016   Caffeine abuse, 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   Acute pericarditis 12/26/2015   Hidradenitis suppurativa 11/07/2015   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   Sinusitis 02/63/7858   Complicated migraine 85/10/7739   Marijuana use 10/20/2015   Smoker 05/04/2015   TIA (transient ischemic attack) 05/04/2015   Palpitations 05/04/2015   Decreased appetite 05/04/2015   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[place]) 04/22/2015   Hyperlipidemia 04/22/2015   TOA (tubo-ovarian abscess) 11/23/2012   History of abnormal Pap smear 12/20/2011   History of PID 12/20/2011    Past Surgical History:  Procedure Laterality  Date   CERVICAL BIOPSY  W/ LOOP ELECTRODE EXCISION     ESOPHAGOGASTRODUODENOSCOPY (EGD) WITH PROPOFOL N/A 10/14/2018   Procedure: ESOPHAGOGASTRODUODENOSCOPY (EGD) WITH PROPOFOL;  Surgeon: Wilford Corner, MD;  Location: [facility];  Service: Endoscopy;  Laterality: N/A;   LEEP      OB History     Gravida  2   Para  1   Term  1   Preterm      AB  1   Living  1      SAB  1   IAB      Ectopic      Multiple      Live Births               Home Medications    Prior to Admission medications   Medication Sig Start Date End Date Taking? Authorizing Provider  cyclobenzaprine (FLEXERIL) 10 MG tablet Take 1 tablet (10 mg total) by mouth 3 (three) times daily as needed. Patient not taking: Reported on 03/08/2021 08/25/20   Nicolette Bang, MD  gabapentin (NEURONTIN) 100 MG capsule Take 1 capsule (100 mg total) by mouth 3 (three) times daily. 03/24/21   Camillia Herter, NP  gabapentin (NEURONTIN) 300 MG capsule Take 1 capsule (300 mg total) by mouth 3 (three) times daily as needed. Patient taking differently: Take 300 mg by mouth  3 (three) times daily as needed (for pain). 08/10/19   Fulp, Cammie, MD  metFORMIN (GLUCOPHAGE) 500 MG tablet Take 1 tablet (500 mg total) by mouth 2 (two) times daily with a meal. Patient not taking: Reported on 06/12/2022 08/10/19   Antony Blackbird, MD    Family History Family History  Problem Relation Age of Onset   Hypertension Mother    Lupus Mother    Diabetes Father    Thyroid disease Sister    Diabetes Sister    Cancer Sister        stomach   Diabetes Maternal Grandmother    Anesthesia problems Neg Hx    Other Neg Hx    Migraines Neg Hx     Social History Social History   Tobacco Use   Smoking status: Former    Packs/day: 1.00    Years: 17.00    Total pack years: 17.00    Types: Cigars, Cigarettes   Smokeless tobacco: Never   Tobacco comments:    quit 02/08/2020  Vaping Use   Vaping Use: Some days  Substance  Use Topics   Alcohol use: Yes    Alcohol/week: 1.0 standard drink of alcohol    Types: 1 Shots of liquor per week    Comment: drink on the weekends   Drug use: Not Currently    Frequency: 3.0 times per week    Types: Marijuana     Allergies   Patient has no known allergies.   Review of Systems Review of Systems  Constitutional:  Negative for chills and fever.  HENT:  Positive for congestion, rhinorrhea and sore throat.   Respiratory:  Positive for cough and wheezing.   Cardiovascular: Negative.   Gastrointestinal: Negative.   Genitourinary: Negative.   Musculoskeletal: Negative.   Skin:  Positive for wound.     Physical Exam Triage Vital Signs ED Triage Vitals  Enc Vitals Group     BP 06/12/22 1257 121/62     Pulse Rate 06/12/22 1257 78     Resp 06/12/22 1257 18     Temp 06/12/22 1257 98.8 F (37.1 C)     Temp Source 06/12/22 1257 Oral     SpO2 06/12/22 1257 98 %     Weight --      Height --      Head Circumference --      Peak Flow --      Pain Score 06/12/22 1258 8     Pain Loc --      Pain Edu? --      Excl. in [place]? --    No data found.  Updated Vital Signs BP 121/62 (BP Location: Left Arm)   Pulse 78   Temp 98.8 F (37.1 C) (Oral)   Resp 18   LMP 05/20/2022   SpO2 98%   Visual Acuity Right Eye Distance:   Left Eye Distance:   Bilateral Distance:    Right Eye Near:   Left Eye Near:    Bilateral Near:     Physical Exam Constitutional:      Appearance: Normal appearance.  HENT:     Head: Normocephalic.     Nose: Congestion and rhinorrhea present.     Mouth/Throat:     Mouth: Mucous membranes are moist.     Pharynx: Posterior oropharyngeal erythema present. No pharyngeal swelling or oropharyngeal exudate.     Tonsils: No tonsillar exudate.  Cardiovascular:     Rate and Rhythm: Normal rate and regular rhythm.  Pulmonary:     Effort: Pulmonary effort is normal.     Breath sounds: Wheezing present.     Comments: Mild,  RUL Musculoskeletal:     Cervical back: Normal range of motion and neck supple. No tenderness.  Skin:    Comments: Changes c/w hidradenitis at the axilla bilaterally;  TTP bilaterally;  small open lesion at the left axilla, no drainage  Neurological:     General: No focal deficit present.     Mental Status: She is alert.  Psychiatric:        Mood and Affect: Mood normal.      UC Treatments / Results  Labs (all labs ordered are listed, but only abnormal results are displayed) Labs Reviewed - No data to display  EKG   Radiology No results found.  Procedures Procedures (including critical care time)  Medications Ordered in UC Medications - No data to display  Initial Impression / Assessment and Plan / UC Course  I have reviewed the triage vital signs and the nursing notes.  Pertinent labs & imaging results that were available during my care of the patient were reviewed by me and considered in my medical decision making (see chart for details).    Final Clinical Impressions(s) / UC Diagnoses   Final diagnoses:  Upper respiratory tract infection, unspecified type  Abscess  Hidradenitis suppurativa     Discharge Instructions      You were seen today for upper respiratory symptoms as well as boils under the arms.  I have treated you with augmentin today to take twice/day x 10 day with food to avoid upset stomach.  I have sent out a cough pill and an inhaler as well.  If you have worsening symptoms despite treatment then please return for further evaluation.     ED Prescriptions     Medication Sig Dispense Auth. Provider   amoxicillin-clavulanate (AUGMENTIN) 875-125 MG tablet Take 1 tablet by mouth every 12 (twelve) hours for 10 days. 20 tablet Jansel Vonstein, MD   benzonatate (TESSALON) 100 MG capsule Take 1 capsule (100 mg total) by mouth every 8 (eight) hours. 21 capsule Robertlee Rogacki, MD   albuterol (VENTOLIN HFA) 108 (90 Base) MCG/ACT inhaler Inhale 2 puffs  into the lungs every 4 (four) hours as needed for wheezing or shortness of breath. 1 each Rondel Oh, MD      PDMP not reviewed this encounter.   Rondel Oh, MD 06/12/22 1319

## 2022-06-13 ENCOUNTER — Other Ambulatory Visit: Payer: Self-pay

## 2022-07-09 ENCOUNTER — Other Ambulatory Visit: Payer: Self-pay

## 2022-07-09 ENCOUNTER — Ambulatory Visit (HOSPITAL_COMMUNITY)
Admission: EM | Admit: 2022-07-09 | Discharge: 2022-07-09 | Disposition: A | Discharge status: Home / Self Care | Payer: No Typology Code available for payment source | Attending: Family Medicine | Admitting: Family Medicine

## 2022-07-09 ENCOUNTER — Encounter (HOSPITAL_COMMUNITY): Payer: Self-pay | Admitting: Emergency Medicine

## 2022-07-09 MED ORDER — ONDANSETRON 4 MG PO TBDP: 4.0000 mg | ORAL_TABLET | Freq: Once | ORAL | Status: DC

## 2022-07-09 MED ORDER — ONDANSETRON 4 MG PO TBDP
4.0000 mg | ORAL_TABLET | Freq: Once | ORAL | Status: AC
  Administered 2022-07-09: 4 mg via ORAL

## 2022-07-09 MED ORDER — ONDANSETRON 4 MG PO TBDP
ORAL_TABLET | ORAL | Status: AC
  Filled 2022-07-09: qty 1

## 2022-07-09 NOTE — ED Notes (Signed)
Patient is being discharged from the Urgent Care and sent to the Emergency Department via pov . Per Dr Windy Carina, patient is in need of higher level of care due to limited resources. Patient is aware and verbalizes understanding of plan of care.  Vitals:   07/09/22 1045  BP: 121/81  Pulse: 64  Resp: 18  Temp: 98.2 F (36.8 C)  SpO2: 100%

## 2022-07-09 NOTE — ED Notes (Signed)
Dr Windy Carina did evaluate patient

## 2022-07-09 NOTE — ED Triage Notes (Signed)
Complains of vision changes, reports having numbness initially, no numbness now.  Had numbness, then turned into tingling.  Reports sitting in front of air conditioning and symptoms stopped.  Patient has headache.  Initially pain in left side of head, now entire head is hurting.  Patient reports wiggle lines and bright lights initially and is still having this pain.  Patient has nausea, no vomiting.    Patient woke at 8:00 am and already feeling these symptoms.  Took ibuprofen at 8:00 am as well.

## 2022-07-09 NOTE — ED Notes (Signed)
Patient declined having ambulance called.

## 2022-07-09 NOTE — ED Provider Notes (Signed)
Here for severe headache that she awoke with this morning about 3 hours ago.  She also is nauseated.  Entire head is hurting she has had some scotomata.  She has never been known to have migraines previously.  Blood pressure is normal here.  I feel that evaluation in the emergency room is best for her acute headache.  She will proceed to the emergency room for higher level of care and evaluation then we can accomplish for her in the urgent care setting  1 dose of zofran given here   Barrett Henle, MD 07/09/22 1056

## 2022-07-11 ENCOUNTER — Observation Stay (HOSPITAL_COMMUNITY): Payer: Self-pay

## 2022-07-11 ENCOUNTER — Encounter (HOSPITAL_COMMUNITY): Payer: Self-pay | Admitting: Emergency Medicine

## 2022-07-11 ENCOUNTER — Emergency Department (HOSPITAL_COMMUNITY): Payer: Self-pay

## 2022-07-11 ENCOUNTER — Other Ambulatory Visit: Payer: Self-pay

## 2022-07-11 ENCOUNTER — Inpatient Hospital Stay (HOSPITAL_COMMUNITY)
Admission: EM | Admit: 2022-07-11 | Discharge: 2022-07-13 | DRG: 066 | Disposition: A | Discharge status: Home / Self Care | Payer: Self-pay | Source: Ambulatory Visit | Attending: Student in an Organized Health Care Education/Training Program | Admitting: Student in an Organized Health Care Education/Training Program

## 2022-07-11 DIAGNOSIS — R29701 NIHSS score 1: Secondary | ICD-10-CM | POA: Diagnosis present

## 2022-07-11 DIAGNOSIS — I639 Cerebral infarction, unspecified: Secondary | ICD-10-CM

## 2022-07-11 DIAGNOSIS — Z8673 Personal history of transient ischemic attack (TIA), and cerebral infarction without residual deficits: Secondary | ICD-10-CM

## 2022-07-11 DIAGNOSIS — Z833 Family history of diabetes mellitus: Secondary | ICD-10-CM

## 2022-07-11 DIAGNOSIS — I63522 Cerebral infarction due to unspecified occlusion or stenosis of left anterior cerebral artery: Principal | ICD-10-CM | POA: Diagnosis present

## 2022-07-11 DIAGNOSIS — E785 Hyperlipidemia, unspecified: Secondary | ICD-10-CM | POA: Diagnosis present

## 2022-07-11 DIAGNOSIS — F17290 Nicotine dependence, other tobacco product, uncomplicated: Secondary | ICD-10-CM | POA: Diagnosis present

## 2022-07-11 DIAGNOSIS — Z560 Unemployment, unspecified: Secondary | ICD-10-CM

## 2022-07-11 DIAGNOSIS — R519 Headache, unspecified: Principal | ICD-10-CM

## 2022-07-11 DIAGNOSIS — Z6831 Body mass index (BMI) 31.0-31.9, adult: Secondary | ICD-10-CM

## 2022-07-11 DIAGNOSIS — R202 Paresthesia of skin: Secondary | ICD-10-CM | POA: Diagnosis present

## 2022-07-11 DIAGNOSIS — Z832 Family history of diseases of the blood and blood-forming organs and certain disorders involving the immune mechanism: Secondary | ICD-10-CM

## 2022-07-11 DIAGNOSIS — Z8249 Family history of ischemic heart disease and other diseases of the circulatory system: Secondary | ICD-10-CM

## 2022-07-11 DIAGNOSIS — E119 Type 2 diabetes mellitus without complications: Secondary | ICD-10-CM | POA: Diagnosis present

## 2022-07-11 DIAGNOSIS — Z720 Tobacco use: Secondary | ICD-10-CM | POA: Diagnosis present

## 2022-07-11 DIAGNOSIS — G459 Transient cerebral ischemic attack, unspecified: Secondary | ICD-10-CM | POA: Diagnosis present

## 2022-07-11 DIAGNOSIS — I10 Essential (primary) hypertension: Secondary | ICD-10-CM | POA: Diagnosis present

## 2022-07-11 DIAGNOSIS — Z8349 Family history of other endocrine, nutritional and metabolic diseases: Secondary | ICD-10-CM

## 2022-07-11 DIAGNOSIS — I63413 Cerebral infarction due to embolism of bilateral middle cerebral arteries: Secondary | ICD-10-CM

## 2022-07-11 DIAGNOSIS — Z800 Family history of malignant neoplasm of digestive organs: Secondary | ICD-10-CM

## 2022-07-11 DIAGNOSIS — Z825 Family history of asthma and other chronic lower respiratory diseases: Secondary | ICD-10-CM

## 2022-07-11 DIAGNOSIS — L732 Hidradenitis suppurativa: Secondary | ICD-10-CM | POA: Diagnosis present

## 2022-07-11 DIAGNOSIS — Z79899 Other long term (current) drug therapy: Secondary | ICD-10-CM

## 2022-07-11 DIAGNOSIS — G43119 Migraine with aura, intractable, without status migrainosus: Secondary | ICD-10-CM

## 2022-07-11 DIAGNOSIS — E669 Obesity, unspecified: Secondary | ICD-10-CM | POA: Diagnosis present

## 2022-07-11 DIAGNOSIS — J45909 Unspecified asthma, uncomplicated: Secondary | ICD-10-CM | POA: Diagnosis present

## 2022-07-11 DIAGNOSIS — G43109 Migraine with aura, not intractable, without status migrainosus: Secondary | ICD-10-CM | POA: Diagnosis present

## 2022-07-11 LAB — CBC WITH DIFFERENTIAL/PLATELET
Abs Immature Granulocytes: 0.02 10*3/uL (ref 0.00–0.07)
Basophils Absolute: 0 10*3/uL (ref 0.0–0.1)
Basophils Relative: 1 %
Eosinophils Absolute: 0.2 10*3/uL (ref 0.0–0.5)
Eosinophils Relative: 2 %
HCT: 36.7 % (ref 36.0–46.0)
Hemoglobin: 11.5 g/dL — ABNORMAL LOW (ref 12.0–15.0)
Immature Granulocytes: 0 %
Lymphocytes Relative: 40 %
Lymphs Abs: 2.9 10*3/uL (ref 0.7–4.0)
MCH: 27.8 pg (ref 26.0–34.0)
MCHC: 31.3 g/dL (ref 30.0–36.0)
MCV: 88.6 fL (ref 80.0–100.0)
Monocytes Absolute: 0.6 10*3/uL (ref 0.1–1.0)
Monocytes Relative: 8 %
Neutro Abs: 3.6 10*3/uL (ref 1.7–7.7)
Neutrophils Relative %: 49 %
Platelets: 333 10*3/uL (ref 150–400)
RBC: 4.14 MIL/uL (ref 3.87–5.11)
RDW: 13.2 % (ref 11.5–15.5)
WBC: 7.2 10*3/uL (ref 4.0–10.5)
nRBC: 0 % (ref 0.0–0.2)

## 2022-07-11 LAB — COMPREHENSIVE METABOLIC PANEL
ALT: 9 U/L (ref 0–44)
AST: 17 U/L (ref 15–41)
Albumin: 3.6 g/dL (ref 3.5–5.0)
Alkaline Phosphatase: 72 U/L (ref 38–126)
Anion gap: 11 (ref 5–15)
BUN: 11 mg/dL (ref 6–20)
CO2: 22 mmol/L (ref 22–32)
Calcium: 8.9 mg/dL (ref 8.9–10.3)
Chloride: 103 mmol/L (ref 98–111)
Creatinine, Ser: 1 mg/dL (ref 0.44–1.00)
GFR, Estimated: >60 mL/min (ref >60)
Glucose, Bld: 97 mg/dL (ref 70–99)
Potassium: 3.6 mmol/L (ref 3.5–5.1)
Sodium: 136 mmol/L (ref 135–145)
Total Bilirubin: 0.3 mg/dL (ref 0.3–1.2)
Total Protein: 7.6 g/dL (ref 6.5–8.1)

## 2022-07-11 LAB — I-STAT BETA HCG BLOOD, ED (MC, WL, AP ONLY): I-stat hCG, quantitative: <5 m[IU]/mL (ref <5)

## 2022-07-11 LAB — LIPID PANEL
Cholesterol: 242 mg/dL — ABNORMAL HIGH (ref 0–200)
HDL: 68 mg/dL (ref >40)
LDL Cholesterol: 163 mg/dL — ABNORMAL HIGH (ref 0–99)
Total CHOL/HDL Ratio: 3.6 RATIO
Triglycerides: 54 mg/dL (ref <150)
VLDL: 11 mg/dL (ref 0–40)

## 2022-07-11 LAB — HEMOGLOBIN A1C
Hgb A1c MFr Bld: 5.8 % — ABNORMAL HIGH (ref 4.8–5.6)
Mean Plasma Glucose: 119.76 mg/dL

## 2022-07-11 MED ORDER — ASPIRIN 81 MG PO TBEC
81.0000 mg | DELAYED_RELEASE_TABLET | Freq: Every day | ORAL | Status: DC
  Administered 2022-07-12 – 2022-07-13 (×2): 81 mg via ORAL
  Filled 2022-07-11 (×2): qty 1

## 2022-07-11 MED ORDER — IOHEXOL 350 MG/ML SOLN
100.0000 mL | Freq: Once | INTRAVENOUS | Status: AC | PRN
  Administered 2022-07-11: 75 mL via INTRAVENOUS

## 2022-07-11 MED ORDER — ACETAMINOPHEN 325 MG PO TABS
650.0000 mg | ORAL_TABLET | Freq: Four times a day (QID) | ORAL | Status: DC | PRN
  Administered 2022-07-11: 650 mg via ORAL
  Filled 2022-07-11: qty 2

## 2022-07-11 MED ORDER — METOCLOPRAMIDE HCL 5 MG/ML IJ SOLN
10.0000 mg | Freq: Once | INTRAMUSCULAR | Status: AC
  Administered 2022-07-11: 10 mg via INTRAVENOUS
  Filled 2022-07-11: qty 2

## 2022-07-11 MED ORDER — ACETAMINOPHEN 650 MG RE SUPP: 650.0000 mg | Freq: Four times a day (QID) | RECTAL | Status: DC | PRN

## 2022-07-11 MED ORDER — ATORVASTATIN CALCIUM 40 MG PO TABS
40.0000 mg | ORAL_TABLET | Freq: Every day | ORAL | Status: DC
  Administered 2022-07-11 – 2022-07-13 (×3): 40 mg via ORAL
  Filled 2022-07-11 (×3): qty 1

## 2022-07-11 MED ORDER — ENOXAPARIN SODIUM 40 MG/0.4ML IJ SOSY
40.0000 mg | PREFILLED_SYRINGE | Freq: Every day | INTRAMUSCULAR | Status: DC
  Administered 2022-07-11 – 2022-07-13 (×3): 40 mg via SUBCUTANEOUS
  Filled 2022-07-11 (×3): qty 0.4

## 2022-07-11 MED ORDER — CLINDAMYCIN PHOSPHATE 1 % EX GEL
Freq: Two times a day (BID) | CUTANEOUS | Status: DC
  Filled 2022-07-11: qty 30

## 2022-07-11 MED ORDER — ACETAMINOPHEN 500 MG PO TABS
1000.0000 mg | ORAL_TABLET | Freq: Once | ORAL | Status: AC
  Administered 2022-07-11: 1000 mg via ORAL
  Filled 2022-07-11: qty 2

## 2022-07-11 MED ORDER — KETOROLAC TROMETHAMINE 15 MG/ML IJ SOLN
15.0000 mg | Freq: Once | INTRAMUSCULAR | Status: AC
  Administered 2022-07-11: 15 mg via INTRAVENOUS
  Filled 2022-07-11: qty 1

## 2022-07-11 MED ORDER — SODIUM CHLORIDE 0.9 % IV SOLN
12.5000 mg | INTRAVENOUS | Status: AC
  Filled 2022-07-11 (×3): qty 0.5

## 2022-07-11 MED ORDER — INFLUENZA VAC SPLIT QUAD 0.5 ML IM SUSY: 0.5000 mL | PREFILLED_SYRINGE | INTRAMUSCULAR | Status: DC

## 2022-07-11 NOTE — Plan of Care (Signed)
Pt admitted to [facility], pt alert and oriented x4, pt on room air, pt ambulatory and independent, pt complains of 3/10 headache but declines medication at this time.

## 2022-07-11 NOTE — ED Provider Triage Note (Addendum)
Emergency Medicine Provider Triage Evaluation Note  Samantha [name] , a 46 y.o. female  was evaluated in triage.  Pt complains of headache.  States began Saturday night-- headache throughout entire head, blurred vision, and numbness/tingling of right arm.  States arm is back to normal now, however headache and changes in vision persist.  Seen at [facility] and sent here for work-up.  Reports history of headaches in the past but never this bad.  Review of Systems  Positive: Headache, vision changes Negative: fever  Physical Exam  BP 120/87 (BP Location: Right Arm)   Pulse 80   Temp 98.2 F (36.8 C) (Oral)   Resp 18   LMP 07/07/2022   SpO2 99%   Gen:   Awake, no distress, squinting with apparent photophobia. Resp:  Normal effort  MSK:   Moves extremities without difficulty  Other:  AAOx3, answering questions and following commands appropriately, moving extremities well, normal gait  Medical Decision Making  Medically screening exam initiated at 12:51 AM.  Appropriate orders placed.  Aalliyah Kyeshia Zinn was informed that the remainder of the evaluation will be completed by another provider, this initial triage assessment does not replace that evaluation, and the importance of remaining in the ED until their evaluation is complete.  Headache, blurred vision, numbness/tingling of right arm began Saturday night.  Arm symptoms have resolved, headache and visual disturbance persist.  Symptoms ongoing 4+ days, code stroke not activated.  May be complicated migraine.  Will check EKG, labs, CT head.  3:50 AM Notified by radiology of abnormal findings in thalamus that are new since last scan.  Possibly lacunar infarct, however not really correlating with symptoms as they were right sided as well.  Recommends obtaining MRI for further evaluation.    Patient has been updated, agreeable to MRI.  This has been ordered.   Larene Pickett, PA-C 07/11/22 0148    Larene Pickett, PA-C 07/11/22 [phone]

## 2022-07-11 NOTE — ED Provider Notes (Signed)
[facility] EMERGENCY DEPARTMENT Provider Note   CSN: [id number] Arrival date & time: 07/11/22  0043     History  Chief Complaint  Patient presents with   Headache    Samantha [name] is a 46 y.o. female.  Patient is a 46 year old female with a past medical history of prior TIA in 2015 presenting to the emergency department with headaches.  The patient states that she has had morning headaches that have occurred each day starting on Sunday.  She states that Sunday she initially had tingling in her right arm and right side of her face and then started to develop with a headache with white spots in her vision of the bilateral eyes.  She states that the tingling has resolved since Monday but she still continues to have the headaches each day.  She states that she feels generally weak all over.  She denies any nausea or vomiting.  She states that she occasionally feels like it is hard to get her words out when she has a headache.  She denies any fevers or chills.  She denies any prior history of headaches.  The history is provided by the patient.  Headache      Home Medications Prior to Admission medications   Medication Sig Start Date End Date Taking? Authorizing Provider  albuterol (VENTOLIN HFA) 108 (90 Base) MCG/ACT inhaler Inhale 2 puffs into the lungs every 4 (four) hours as needed for wheezing or shortness of breath. 06/12/22   Piontek, Junie Panning, MD  benzonatate (TESSALON) 100 MG capsule Take 1 capsule (100 mg total) by mouth every 8 (eight) hours. Patient not taking: Reported on 07/09/2022 06/12/22   Rondel Oh, MD  gabapentin (NEURONTIN) 100 MG capsule Take 1 capsule (100 mg total) by mouth 3 (three) times daily. Patient not taking: Reported on 07/09/2022 03/24/21   Camillia Herter, NP  gabapentin (NEURONTIN) 300 MG capsule Take 1 capsule (300 mg total) by mouth 3 (three) times daily as needed. Patient not taking: Reported on 07/09/2022 08/10/19   Antony Blackbird,  MD  metFORMIN (GLUCOPHAGE) 500 MG tablet Take 1 tablet (500 mg total) by mouth 2 (two) times daily with a meal. Patient not taking: Reported on 06/12/2022 08/10/19   Antony Blackbird, MD      Allergies    Patient has no known allergies.    Review of Systems   Review of Systems  Neurological:  Positive for headaches.    Physical Exam Updated Vital Signs BP 120/77   Pulse 63   Temp 98.8 F (37.1 C)   Resp 18   LMP 07/07/2022   SpO2 100%  Physical Exam Vitals and nursing note reviewed.  Constitutional:      General: She is not in acute distress.    Appearance: She is well-developed.  HENT:     Head: Normocephalic and atraumatic.  Eyes:     General: No visual field deficit.    Extraocular Movements: Extraocular movements intact.     Pupils: Pupils are equal, round, and reactive to light.  Cardiovascular:     Rate and Rhythm: Normal rate and regular rhythm.  Pulmonary:     Effort: Pulmonary effort is normal.     Breath sounds: Normal breath sounds.  Abdominal:     Palpations: Abdomen is soft.  Musculoskeletal:        General: Normal range of motion.     Cervical back: Normal range of motion and neck supple.  Skin:    General:  Skin is warm and dry.     Findings: No rash.  Neurological:     Mental Status: She is alert and oriented to person, place, and time.     GCS: GCS eye subscore is 4. GCS verbal subscore is 5. GCS motor subscore is 6.     Cranial Nerves: No cranial nerve deficit, dysarthria or facial asymmetry.     Sensory: No sensory deficit.     Motor: No weakness.     Coordination: Coordination normal.  Psychiatric:        Mood and Affect: Mood normal.        Behavior: Behavior normal.     ED Results / Procedures / Treatments   Labs (all labs ordered are listed, but only abnormal results are displayed) Labs Reviewed  CBC WITH DIFFERENTIAL/PLATELET - Abnormal; Notable for the following components:      Result Value   Hemoglobin 11.5 (*)    All other  components within normal limits  COMPREHENSIVE METABOLIC PANEL  I-STAT BETA HCG BLOOD, ED ([facility], [facility], [facility] ONLY)    EKG EKG Interpretation  Date/Time:  Wednesday July 11 2022 07:59:04 EDT Ventricular Rate:  59 PR Interval:  154 QRS Duration: 74 QT Interval:  422 QTC Calculation: 417 R Axis:   15 Text Interpretation: Sinus bradycardia Otherwise normal ECG  Rate decreased, otherwise no significant change compared to prior EKG Confirmed by Leanord Asal ([id number]) on 07/11/2022 8:03:01 AM  Radiology MR BRAIN WO CONTRAST  Result Date: 07/11/2022 CLINICAL DATA:  Headache, new or worsening EXAM: MRI HEAD WITHOUT CONTRAST TECHNIQUE: Multiplanar, multiecho pulse sequences of the brain and surrounding structures were obtained without intravenous contrast. COMPARISON:  Head CT from earlier today FINDINGS: Brain: Tiny area of diffusion hyperintensity along the left frontal parietal cortex and high right frontal cortex, marked on axial images. The diffusion signal is more intense on the right. These are less well seen on coronal acquisition due to spacing. These are difficult to localize on ADC map but are presumably acute to subacute. Partially empty sella, nonspecific in isolation and intervally stable. Few FLAIR hyperintensities in the cerebral white matter. Notable subinsular FLAIR hyperintensity on the left without mass effect or volume loss, stable from a 2019 head CT. No hydrocephalus or collection. Vascular: Major flow voids are preserved Skull and upper cervical spine: Normal marrow signal. Sinuses/Orbits: Retention cyst along the floor the left maxillary sinus. 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 Electronically Signed   By: Jorje Guild M.D.   On: 07/11/2022 06:50   CT HEAD WO CONTRAST (5MM)  Result Date: 07/11/2022 CLINICAL DATA:  Headache, new or worsening, neuro deficit (Age 42-49y) EXAM: CT HEAD WITHOUT CONTRAST  TECHNIQUE: Contiguous axial images were obtained from the base of the skull through the vertex without intravenous contrast. RADIATION DOSE REDUCTION: This exam was performed according to the departmental dose-optimization program which includes automated exposure control, adjustment of the mA and/or kV according to patient size and/or use of iterative reconstruction technique. COMPARISON:  06/04/2018 FINDINGS: Brain: Normal anatomic configuration of the brain. There is an age-indeterminate lacunar infarct within the right thalamus, new since prior examination. No acute intracranial hemorrhage. No abnormal mass effect midline shift. No abnormal intra or extra-axial mass lesion. Ventricular size is normal. Cerebellum is unremarkable. Vascular: No hyperdense vessel or unexpected calcification. Skull: Normal. Negative for fracture or focal lesion. Sinuses/Orbits: No acute finding. Other: Mastoid air cells and middle ear cavities are  clear. IMPRESSION: 1. 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 This could be confirmed and better characterized with MRI examination. Electronically Signed   By: Fidela Salisbury M.D.   On: 07/11/2022 03:45    Procedures Procedures    Medications Ordered in ED Medications  ketorolac (TORADOL) 15 MG/ML injection 15 mg (15 mg Intravenous Given 07/11/22 0829)  metoCLOPramide (REGLAN) injection 10 mg (10 mg Intravenous Given 07/11/22 0832)  acetaminophen (TYLENOL) tablet 1,000 mg (1,000 mg Oral Given 07/11/22 5400)    ED Course/ Medical Decision Making/ A&P Clinical Course as of 07/11/22 0948  Wed Jul 11, 2022  0859 I spoke with neurology who recommends admission to medicine for further stroke work up. [VK]    Clinical Course User Index [VK] Kemper Durie, DO                           Medical Decision Making This patient presents to the ED with chief complaint(s) of headache and transient right-sided tingling with  pertinent past medical history of prior TIA in 2015 but no longer on any meds which further complicates the presenting complaint. The complaint involves an extensive differential diagnosis and also carries with it a high risk of complications and morbidity.    The differential diagnosis includes migraine, ICH, mass effect, TIA, CVA, electrolyte abnormality, viral syndrome Mia  Additional history obtained: Additional history obtained from N/A Records reviewed prior neurology records  ED Course and Reassessment: Patient was initially evaluated by provider in triage and had labs and CT performed.  Labs are within normal range and CT showed concern for possible acute infarct and MRI was performed that showed 2 areas of acute to subacute infarct in the left frontal parietal and right frontal cortex.  The patient is still having this headache at this time but current NIH is 0 without any focal neurologic deficits on her exam.  She will be given a headache cocktail and neurology will be consulted for further recommendations.  Independent labs interpretation:  The following labs were independently interpreted: Within normal range  Independent visualization of imaging: - I independently visualized the following imaging with scope of interpretation limited to determining acute life threatening conditions related to emergency care: CT head/MRI brain, which revealed acute to subacute infarct in the left frontal parietal and right frontal cortex  Consultation: - Consulted or discussed management/test interpretation w/ external professional: Neurology  Consideration for admission or further workup: Patient requires admission for further stroke work-up Social Determinants of health: N/A    Risk OTC drugs. Prescription drug management. Decision regarding hospitalization.          Final Clinical Impression(s) / ED Diagnoses Final diagnoses:  Acute nonintractable headache, unspecified headache  type  Cerebrovascular accident (CVA), unspecified mechanism [facility])    Rx / [place] Orders ED Discharge Orders     None         Kemper Durie, DO 07/11/22 [phone]

## 2022-07-11 NOTE — ED Triage Notes (Signed)
Patient reports headache with facial tingling and right arm tingling and " blinking white" visual disturbances onset last Sunday .

## 2022-07-11 NOTE — H&P (Incomplete)
Date: 07/11/2022               Patient Name:  Samantha [name] MRN: [id number]  DOB: [birth year] Age / Sex: 46 y.o., female   PCP: Pcp, No         Medical Service: Internal Medicine Teaching Service         Attending Physician: Dr. Evette Doffing, Mallie Mussel, *    First Contact: Dr. Linward Natal Pager: [phone]  Second Contact: Dr. Farrel Gordon Pager: [phone]       After Hours (After 5p/  First Contact Pager: [phone]  weekends / holidays): Second Contact Pager: [phone]   Chief Complaint: Headache  History of Present Illness:  Samantha [name] is a 46 year old person living with prior history of TIA, hidradenitis suppurativa, hyperlipidemia, migraine asthma who presents today for headache with associated numbness tingling the right face and right upper extremity.  Reports history of daily migraines for the past several years.  However in the last 2 weeks has noticed visual disturbances in both of her eyes.  3 days ago she had 1 episode of numbness and tingling in her right face and right hand and arm which lasted about 10 to 15 minutes.  This is since resolved and has not returned.  She reports similar symptoms with TIA in 2017.  Headaches are right-sided throbbing typically last about 4 hours and improved with ibuprofen and resting.  She takes ibuprofen 800 mg daily or every other day.  She notes headaches are associated with vivid dreams and often wakes up in the morning with headache after such dreams. She has not seen a primary care doctor since 2020 due to difficulty getting appointments as well as losing insurance after she quit her job to take care of her mother.  Denies fevers, chills, nausea, vomiting pain, weakness, or syncope.  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  Father with diabetes, passed in 2022  Social History: [place].  Previously was taking care of her mother who passed away earlier this year.  Currently looking for a job. She vapes daily with prior history of cigarette use but not currently smoking.  Reports prior PCP at [facility] [place] but has not been seen since 2020. Denies marijuana se or other illicit drug use. Drinks about 4 drinks every other month.  Review of Systems: A complete ROS was negative except as per HPI.   Physical Exam: Blood pressure 103/64, pulse 63, temperature 98.4 F (36.9 C), resp. rate 15, last menstrual period 07/07/2022, SpO2 98 %. Constitutional: Appears well-developed and well-nourished. No distress.  HENT: Normocephalic and atraumatic, EOMI, conjunctiva normal, moist mucous membranes Cardiovascular: Normal rate, regular rhythm. no murmurs, rubs, gallops.  Distal pulses intact Respiratory: No respiratory distress, no accessory muscle use.  Effort is normal.  Lungs are clear to auscultation bilaterally. GI: Nondistended, soft, nontender to palpation, normal active bowel sounds Musculoskeletal: Normal bulk and tone.  No peripheral edema noted. Neurological: Is alert and oriented x4, speech is clear and fluent.  No weakness.  No sensory changes.  Cranial nerves II through XII intact. Skin: Warm and dry.  2cm indurated area of the left axilla, no drainage or fluctuance  Psychiatric: Normal mood and affect.   EKG: personally reviewed my interpretation is mild bradycardia with rate of 59.  .ri  CT head 1. 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 This could be confirmed  and better characterized with MRI examination.  MRI brain Two small acute to subacute infarcts along the left frontal parietal and right frontal cortex.  CTA head and neck 1. No evidence of large vessel occlusion or hemodynamically significant stenosis neck.   2. See same day CT brain for additional findings. Infarcts seen on same day brain MRI is not visualized due to CT technique.  Assessment & Plan by Problem: Principal Problem:   Migraine headache with aura  Right-sided headache Presents with worsening right-sided headache with associated scotoma last 2 weeks.  Associated with right facial and right upper extremity numbness and tingling.  CT imaging concerning for right lacunar infarct.  MRI with 2 small acute to subacute infarcts of the left frontal parietal and right frontal cortex.  Neurology reviewed imaging and felt that imaging findings were likely artifact in reflecting true.  Differential includes CVA versus complex migraine.  She has had multiple admissions for migraines in the past.  Given her frequency of uses of NSAIDs I am concerned she now has rebound headaches as well. -Neurology following -Check A1c and lipid panel -BP normotensive -f/u echocardiogram  -Avoid pain medications for concern for rebound headaches, may benefit from migrain preventative is she continues to have frequent headaches  Prediabetes A1c5.8%. Reports history of prediabetes was on metformin but stopped due to weight loss. She is unsure if she had associated GI symptoms. - Will need outpatient follow up - [facility]  Hyperlipidemia Cholesterol 242, LDL 163. Previously prescribed atorvastatin but does not think she ever took this. Unclear if prior TIA was actually a complex migraine.  - Start atorvastatin 40 mg daily  Hidradenitis Suppurativa Previously on oral doxycycline. Reports she has recurrent episodes making it difficult for her to find work. Has small indurated area  in the left axilla without drainage or erythema.  Dispo: Admit patient to Observation with expected length of stay less than 2 midnights.  Signed: Iona Beard, MD 07/11/2022, 12:48 PM  Pager: [phone] After 5pm on weekdays and 1pm on weekends: On Call pager: [phone]

## 2022-07-11 NOTE — ED Notes (Signed)
ED TO INPATIENT HANDOFF REPORT  ED Nurse Name and Phone #: [phone]  S Name/Age/Gender Samantha [name] 46 y.o. female Room/Bed: H012C/H012C  Code Status   Code Status: Full Code  Home/SNF/Other Home Patient oriented to: self, place, time, and situation Is this baseline? Yes   Triage Complete: Triage complete  Chief Complaint Migraine headache with aura [C62.376]  Triage Note Patient reports headache with facial tingling and right arm tingling and " blinking white" visual disturbances onset last Sunday .    Allergies No Known Allergies  Level of Care/Admitting Diagnosis ED Disposition     ED Disposition  Admit   Condition  --   Comment  Hospital Area: [facility] [[id number]]  Level of Care: Med-Surg [16]  May place patient in observation at [facility] or [place] if equivalent level of care is available:: No  Covid Evaluation: Asymptomatic - no recent exposure (last 10 days) testing not required  Diagnosis: Migraine headache with aura [722162]  Admitting Physician: Axel Filler [[id number]]  Attending Physician: Axel Filler [[id number]]          B Medical/Surgery History 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   Depression    Phreesia 08/25/2020   Diabetes mellitus without complication ([place])    Phreesia 08/25/2020   Hydradenitis    Hyperlipidemia    TIA (transient ischemic attack) 04/2015   questionable, most likely complicated migraine   Past Surgical History:  Procedure Laterality Date   CERVICAL BIOPSY  W/ LOOP ELECTRODE EXCISION     ESOPHAGOGASTRODUODENOSCOPY (EGD) WITH PROPOFOL N/A 10/14/2018   Procedure: ESOPHAGOGASTRODUODENOSCOPY (EGD) WITH PROPOFOL;  Surgeon: Wilford Corner, MD;  Location: [facility] ENDOSCOPY;  Service: Endoscopy;  Laterality: N/A;   LEEP       A IV Location/Drains/Wounds Patient Lines/Drains/Airways Status      Active Line/Drains/Airways     Name Placement date Placement time Site Days   Peripheral IV 07/11/22 20 G Left Antecubital 07/11/22  0810  Antecubital  less than 1            Intake/Output Last 24 hours No intake or output data in the 24 hours ending 07/11/22 1051  Labs/Imaging Results for orders placed or performed during the hospital encounter of 07/11/22 (from the past 48 hour(s))  CBC with Differential     Status: Abnormal   Collection Time: 07/11/22  1:10 AM  Result Value Ref Range   WBC 7.2 4.0 - 10.5 K/uL   RBC 4.14 3.87 - 5.11 MIL/uL   Hemoglobin 11.5 (L) 12.0 - 15.0 g/dL   HCT 36.7 36.0 - 46.0 %   MCV 88.6 80.0 - 100.0 fL   MCH 27.8 26.0 - 34.0 pg   MCHC 31.3 30.0 - 36.0 g/dL   RDW 13.2 11.5 - 15.5 %   Platelets 333 150 - 400 K/uL   nRBC 0.0 0.0 - 0.2 %   Neutrophils Relative % 49 %   Neutro Abs 3.6 1.7 - 7.7 K/uL   Lymphocytes Relative 40 %   Lymphs Abs 2.9 0.7 - 4.0 K/uL   Monocytes Relative 8 %   Monocytes Absolute 0.6 0.1 - 1.0 K/uL   Eosinophils Relative 2 %   Eosinophils Absolute 0.2 0.0 - 0.5 K/uL   Basophils Relative 1 %   Basophils Absolute 0.0 0.0 - 0.1 K/uL   Immature Granulocytes 0 %  Abs Immature Granulocytes 0.02 0.00 - 0.07 K/uL    Comment: Performed at [facility], [place] [address], [place], [place] [address]  Comprehensive metabolic panel     Status: None   Collection Time: 07/11/22  1:10 AM  Result Value Ref Range   Sodium 136 135 - 145 mmol/L   Potassium 3.6 3.5 - 5.1 mmol/L   Chloride 103 98 - 111 mmol/L   CO2 22 22 - 32 mmol/L   Glucose, Bld 97 70 - 99 mg/dL    Comment: Glucose reference range applies only to samples taken after fasting for at least 8 hours.   BUN 11 6 - 20 mg/dL   Creatinine, Ser 1.00 0.44 - 1.00 mg/dL   Calcium 8.9 8.9 - 10.3 mg/dL   Total Protein 7.6 6.5 - 8.1 g/dL   Albumin 3.6 3.5 - 5.0 g/dL   AST 17 15 - 41 U/L   ALT 9 0 - 44 U/L   Alkaline Phosphatase 72 38 - 126 U/L   Total Bilirubin 0.3 0.3 -  1.2 mg/dL   GFR, Estimated >60 >60 mL/min    Comment: (NOTE) Calculated using the CKD-EPI Creatinine Equation (2021)    Anion gap 11 5 - 15    Comment: Performed at [facility] [place] [address], [place],  [address]  I-Stat Beta hCG blood, ED ([facility], [facility], [facility] only)     Status: None   Collection Time: 07/11/22  1:58 AM  Result Value Ref Range   I-stat hCG, quantitative <5.0 <5 mIU/mL   Comment 3            Comment:   GEST. AGE      CONC.  (mIU/mL)   <=1 WEEK        5 - 50     2 WEEKS       50 - 500     3 WEEKS       100 - 10,000     4 WEEKS     1,000 - 30,000        FEMALE AND NON-PREGNANT FEMALE:     LESS THAN 5 mIU/mL    MR BRAIN WO CONTRAST  Result Date: 07/11/2022 CLINICAL DATA:  Headache, new or worsening EXAM: MRI HEAD WITHOUT CONTRAST TECHNIQUE: Multiplanar, multiecho pulse sequences of the brain and surrounding structures were obtained without intravenous contrast. COMPARISON:  Head CT from earlier today FINDINGS: Brain: Tiny area of diffusion hyperintensity along the left frontal parietal cortex and high right frontal cortex, marked on axial images. The diffusion signal is more intense on the right. These are less well seen on coronal acquisition due to spacing. These are difficult to localize on ADC map but are presumably acute to subacute. Partially empty sella, nonspecific in isolation and intervally stable. Few FLAIR hyperintensities in the cerebral white matter. Notable subinsular FLAIR hyperintensity on the left without mass effect or volume loss, stable from a 2019 head CT. No hydrocephalus or collection. Vascular: Major flow voids are preserved Skull and upper cervical spine: Normal marrow signal. Sinuses/Orbits: Retention cyst along the floor the left maxillary sinus. 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 Electronically Signed   By: Jorje Guild M.D.   On: 07/11/2022 06:50   CT HEAD WO CONTRAST  (5MM)  Result Date: 07/11/2022 CLINICAL DATA:  Headache, new or worsening, neuro deficit (Age 52-49y) EXAM: CT HEAD WITHOUT CONTRAST TECHNIQUE: Contiguous axial images were  obtained from the base of the skull through the vertex without intravenous contrast. RADIATION DOSE REDUCTION: This exam was performed according to the departmental dose-optimization program which includes automated exposure control, adjustment of the mA and/or kV according to patient size and/or use of iterative reconstruction technique. COMPARISON:  06/04/2018 FINDINGS: Brain: Normal anatomic configuration of the brain. There is an age-indeterminate lacunar infarct within the right thalamus, new since prior examination. No acute intracranial hemorrhage. No abnormal mass effect midline shift. No abnormal intra or extra-axial mass lesion. Ventricular size is normal. Cerebellum is unremarkable. Vascular: No hyperdense vessel or unexpected calcification. Skull: Normal. Negative for fracture or focal lesion. Sinuses/Orbits: No acute finding. Other: Mastoid air cells and middle ear cavities are clear. IMPRESSION: 1. 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 This could be confirmed and better characterized with MRI examination. Electronically Signed   By: Fidela Salisbury M.D.   On: 07/11/2022 03:45    Pending Labs Unresulted Labs (From admission, onward)     Start     Ordered   07/11/22 1035  Hemoglobin A1c  Once,   R        07/11/22 1035   07/11/22 1035  Lipid panel  Once,   R        07/11/22 1035   07/11/22 1033  HIV Antibody (routine testing w rflx)  (HIV Antibody (Routine testing w reflex) panel)  Once,   R        07/11/22 1035            Vitals/Pain Today's Vitals   07/11/22 0501 07/11/22 0805 07/11/22 0814 07/11/22 1028  BP: 112/74  120/77 103/64  Pulse: 66  63 63  Resp: '17  18 15  '$ Temp: 98.8 F (37.1 C)   98.4 F (36.9 C)  TempSrc:      SpO2: 99%  100% 98%  PainSc:   10-Worst pain ever      Isolation Precautions No active isolations  Medications Medications  promethazine (PHENERGAN) 12.5 mg in sodium chloride 0.9 % 50 mL IVPB (has no administration in time range)  enoxaparin (LOVENOX) injection 40 mg (has no administration in time range)  acetaminophen (TYLENOL) tablet 650 mg (has no administration in time range)    Or  acetaminophen (TYLENOL) suppository 650 mg (has no administration in time range)  ketorolac (TORADOL) 15 MG/ML injection 15 mg (15 mg Intravenous Given 07/11/22 0829)  metoCLOPramide (REGLAN) injection 10 mg (10 mg Intravenous Given 07/11/22 0832)  acetaminophen (TYLENOL) tablet 1,000 mg (1,000 mg Oral Given 07/11/22 0826)    Mobility walks Low fall risk   Focused Assessments    R Recommendations: See Admitting Provider Note  Report given to:   Additional Notes:

## 2022-07-11 NOTE — Consult Note (Signed)
NEURO HOSPITALIST CONSULT NOTE   Requesting physician: Dr. Maylon Peppers  Reason for Consult: Headache with sensory symptoms  History obtained from:  Patient and Chart     HPI:                                                                                                                                          Samantha [name] is a 46 y.o. female with a PMHx of basilar migraine and other conditions as listed below, presenting to the ED for 2-week history of persistent headaches with associated transient episode of numbness and tingling of right arm and face that occurred 3 days ago and resolved within 10 minutes.  Patient describes a 2-year history of chronic headaches that occur when she wakes up in the morning, also describes vivid dreams that she associates with these headaches. Typically they resolve with ibuprofen, but for the past 2 weeks they have persisted and did not improve with medications.  Denies any specific alleviating or aggravating factors.  Describes the headaches as beginning on her left temple and radiating to the right, with a flashing lights minutes prior to the onset of the headaches.  Today she denies any neurological deficits and notes that the headache is still present though not as intense as prior.   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   Depression    [employer]reesia 08/25/2020   Diabetes mellitus without complication ([place])    [employer]reesia 08/25/2020   Hydradenitis    Hyperlipidemia    TIA (transient ischemic attack) 04/2015   questionable, most likely complicated migraine    Past Surgical History:  Procedure Laterality Date   CERVICAL BIOPSY  W/ LOOP ELECTRODE EXCISION     ESOPHAGOGASTRODUODENOSCOPY (EGD) WITH PROPOFOL N/A 10/14/2018   Procedure: ESOPHAGOGASTRODUODENOSCOPY (EGD) WITH PROPOFOL;  Surgeon: Wilford Corner, MD;  Location: [facility];  Service:  Endoscopy;  Laterality: N/A;   LEEP      Family History  Problem Relation Age of Onset   Hypertension Mother    Lupus Mother    Diabetes Father    Thyroid disease Sister    Diabetes Sister    Cancer Sister        stomach   Diabetes Maternal Grandmother    Anesthesia problems Neg Hx    Other Neg Hx    Migraines Neg Hx   Family History: Per chart review Mother: hypertension and lupus Father: deceased, T2DM  Social History:  reports that she has quit smoking. Her smoking use included cigars and cigarettes. She has a 17.00 pack-year smoking history. She has never used smokeless tobacco. She reports current alcohol use of about 1.0 standard  drink of alcohol per week. She reports that she does not currently use drugs after having used the following drugs: Marijuana. Frequency: 3.00 times per week.  No Known Allergies  MEDICATIONS:                                                                                                                     Per patient, she does not take any scheduled medications or supplements.   ROS:                                                                                                                                       History obtained from the patient  General ROS: negative for - chills, fatigue, fever, night sweats, weight gain or weight loss Psychological ROS: negative for - behavioral disorder, hallucinations, memory difficulties, mood swings or suicidal ideation Ophthalmic ROS: negative for - blurry vision, double vision, eye pain or loss of vision ENT ROS: negative for - epistaxis, nasal discharge, oral lesions, sore throat, tinnitus or vertigo Allergy and Immunology ROS: negative for - hives or itchy/watery eyes Hematological and Lymphatic ROS: negative for - bleeding problems, bruising or swollen lymph nodes Endocrine ROS: negative for - galactorrhea, hair pattern changes, polydipsia/polyuria or temperature intolerance Respiratory ROS:  negative for - cough, hemoptysis, shortness of breath or wheezing Cardiovascular ROS: negative for - chest pain, dyspnea on exertion, edema or irregular heartbeat Gastrointestinal ROS: negative for - abdominal pain, diarrhea, hematemesis, nausea/vomiting or stool incontinence Genito-Urinary ROS: negative for - dysuria, hematuria, incontinence or urinary frequency/urgency Musculoskeletal ROS: negative for - joint swelling or muscular weakness Neurological ROS: as noted in HPI Dermatological ROS: negative for rash and skin lesion changes   Blood pressure 120/77, pulse 63, temperature 98.8 F (37.1 C), resp. rate 18, last menstrual period 07/07/2022, SpO2 100 %.   General Examination:  Physical Exam  HEENT-  Normocephalic, no lesions, without obvious abnormality.  Normal external eye and conjunctiva.   Cardiovascular- S1-S2 audible, pulses palpable throughout   Lungs-no rhonchi or wheezing noted, no excessive working breathing.  Saturations within normal limits Abdomen- All 4 quadrants palpated and nontender Extremities- Warm, dry and intact Musculoskeletal-no joint tenderness, deformity or swelling Skin-warm and dry, no hyperpigmentation, vitiligo, or suspicious lesions  Neurological Examination Mental Status: Alert, oriented, thought content appropriate.  Speech fluent without evidence of aphasia.  Able to follow 3 step commands without difficulty. Cranial Nerves: II: Discs flat bilaterally; Visual fields grossly normal,  III,IV, VI: ptosis not present, extra-ocular motions intact bilaterally pupils equal, round, reactive to light and accommodation V,VII: smile symmetric, facial light touch sensation normal bilaterally VIII: hearing normal bilaterally IX,X: uvula rises symmetrically XI: bilateral shoulder shrug XII: midline tongue extension Motor: Right : Upper extremity    5/5    Left:     Upper extremity   5/5  Lower extremity   5/5     Lower extremity   5/5 Tone and bulk:normal tone throughout; no atrophy noted Sensory: Pinprick and light touch intact throughout, bilaterally Deep Tendon Reflexes: 2+ and symmetric throughout Plantars: Right: downgoing   Left: downgoing Cerebellar: normal finger-to-nose, normal rapid alternating movements and normal heel-to-shin test Gait: normal gait and station   Lab Results: Basic Metabolic Panel: Recent Labs  Lab 07/11/22 0110  NA 136  K 3.6  CL 103  CO2 22  GLUCOSE 97  BUN 11  CREATININE 1.00  CALCIUM 8.9    CBC: Recent Labs  Lab 07/11/22 0110  WBC 7.2  NEUTROABS 3.6  HGB 11.5*  HCT 36.7  MCV 88.6  PLT 333    Cardiac Enzymes: No results for input(s): "CKTOTAL", "CKMB", "CKMBINDEX", "TROPONINI" in the last 168 hours.  Lipid Panel: No results for input(s): "CHOL", "TRIG", "HDL", "CHOLHDL", "VLDL", "LDLCALC" in the last 168 hours.  Imaging: MR BRAIN WO CONTRAST  Result Date: 07/11/2022 CLINICAL DATA:  Headache, new or worsening EXAM: MRI HEAD WITHOUT CONTRAST TECHNIQUE: Multiplanar, multiecho pulse sequences of the brain and surrounding structures were obtained without intravenous contrast. COMPARISON:  Head CT from earlier today FINDINGS: Brain: Tiny area of diffusion hyperintensity along the left frontal parietal cortex and high right frontal cortex, marked on axial images. The diffusion signal is more intense on the right. These are less well seen on coronal acquisition due to spacing. These are difficult to localize on ADC map but are presumably acute to subacute. Partially empty sella, nonspecific in isolation and intervally stable. Few FLAIR hyperintensities in the cerebral white matter. Notable subinsular FLAIR hyperintensity on the left without mass effect or volume loss, stable from a 2019 head CT. No hydrocephalus or collection. Vascular: Major flow voids are preserved Skull and upper cervical  spine: Normal marrow signal. Sinuses/Orbits: Retention cyst along the floor the left maxillary sinus. 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 Electronically Signed   By: Jorje Guild M.D.   On: 07/11/2022 06:50   CT HEAD WO CONTRAST (5MM)  Result Date: 07/11/2022 CLINICAL DATA:  Headache, new or worsening, neuro deficit (Age 39-49y) EXAM: CT HEAD WITHOUT CONTRAST TECHNIQUE: Contiguous axial images were obtained from the base of the skull through the vertex without intravenous contrast. RADIATION DOSE REDUCTION: This exam was performed according to the departmental dose-optimization program which includes automated exposure control, adjustment of the mA and/or kV according to patient size  and/or use of iterative reconstruction technique. COMPARISON:  06/04/2018 FINDINGS: Brain: Normal anatomic configuration of the brain. There is an age-indeterminate lacunar infarct within the right thalamus, new since prior examination. No acute intracranial hemorrhage. No abnormal mass effect midline shift. No abnormal intra or extra-axial mass lesion. Ventricular size is normal. Cerebellum is unremarkable. Vascular: No hyperdense vessel or unexpected calcification. Skull: Normal. Negative for fracture or focal lesion. Sinuses/Orbits: No acute finding. Other: Mastoid air cells and middle ear cavities are clear. IMPRESSION: 1. 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 This could be confirmed and better characterized with MRI examination. Electronically Signed   By: Fidela Salisbury M.D.   On: 07/11/2022 03:45     Assessment: 46 year old female with prior history of headache associated with transient numbness that was diagnosed as TIA, who presents today with headache, facial tingling and RUE tingling in conjunction with visual disturbance - Neurological exam is nonfocal - DDx:  - Symptoms most suggestive of  complicated migraine this presentation, and prior presentation also may have been complicated migraine although was diagnosed with TIA at that time.  - Also could be small acute strokes associated with headache, in which case vasculitis would also be on the DDx. However, on personal review of MRI, one of the two DWI abnormalities appears most consistent with incidental T2-shine through artifact and the other appears compatible with magnetic susceptibility artifact at CSF-bone-tissue interface (only seen on axial DWI with no correlate on coronal DWI, further increasing the likelihood that this is artifact).  Recommendations: -Cardiac telemetry -Complete stroke work-up  -Place patient in observation -Phenergan 12.5 mg IV x2 over total 30 minutes -ASA 81 mg po qd -HbgA1c and fasting lipid panel -Consider starting a statin if stroke work up suggests that the 2 signals on MRI is actually due to infarctions rather than artifact.  -Echocardiogram -CTA head and neck -BP management   Electronically signed: Dr. Kerney Elbe 07/11/2022, 9:02 AM

## 2022-07-11 NOTE — Progress Notes (Signed)
Paged about patient having pain secondary to Hidradenitis Suppuritiva. Per chart review, seems like she was taking doxycycline last year when she had a flare up. More recently she was prescribed clindamycin cream which she found relief. On my exam, there was an erythematous indurated area bilaterally. Will start her on clindamycin topical cream twice a day and see if pain deteriorates, if not consider switching to oral doxycyline.

## 2022-07-12 ENCOUNTER — Observation Stay (HOSPITAL_COMMUNITY): Payer: Self-pay

## 2022-07-12 ENCOUNTER — Other Ambulatory Visit (HOSPITAL_COMMUNITY): Payer: Self-pay

## 2022-07-12 DIAGNOSIS — I10 Essential (primary) hypertension: Secondary | ICD-10-CM

## 2022-07-12 DIAGNOSIS — I63413 Cerebral infarction due to embolism of bilateral middle cerebral arteries: Secondary | ICD-10-CM

## 2022-07-12 DIAGNOSIS — G43109 Migraine with aura, not intractable, without status migrainosus: Secondary | ICD-10-CM

## 2022-07-12 LAB — ECHOCARDIOGRAM COMPLETE
AR max vel: 2.11 cm2
AV Area VTI: 2.04 cm2
AV Area mean vel: 1.83 cm2
AV Mean grad: 4 mmHg
AV Peak grad: 6.8 mmHg
Ao pk vel: 1.3 m/s
Area-P 1/2: 3.53 cm2
Calc EF: 60.1 %
Height: 64 in
S' Lateral: 2.6 cm
Single Plane A2C EF: 55.5 %
Single Plane A4C EF: 64 %
Weight: 2944 oz

## 2022-07-12 LAB — RAPID URINE DRUG SCREEN, HOSP PERFORMED
Amphetamines: NOT DETECTED
Barbiturates: NOT DETECTED
Benzodiazepines: NOT DETECTED
Cocaine: NOT DETECTED
Opiates: NOT DETECTED
Tetrahydrocannabinol: NOT DETECTED

## 2022-07-12 LAB — VITAMIN B12: Vitamin B-12: 588 pg/mL (ref 180–914)

## 2022-07-12 LAB — RPR: RPR Ser Ql: NONREACTIVE

## 2022-07-12 LAB — HIV ANTIBODY (ROUTINE TESTING W REFLEX): HIV Screen 4th Generation wRfx: NONREACTIVE

## 2022-07-12 LAB — ANTITHROMBIN III: AntiThromb III Func: 110 % (ref 75–120)

## 2022-07-12 MED ORDER — ADULT MULTIVITAMIN W/MINERALS CH
1.0000 | ORAL_TABLET | Freq: Every day | ORAL | Status: DC
  Administered 2022-07-12 – 2022-07-13 (×2): 1 via ORAL
  Filled 2022-07-12 (×2): qty 1

## 2022-07-12 MED ORDER — ENSURE ENLIVE PO LIQD
237.0000 mL | Freq: Three times a day (TID) | ORAL | Status: DC
  Administered 2022-07-12 – 2022-07-13 (×4): 237 mL via ORAL

## 2022-07-12 MED ORDER — DOXYCYCLINE HYCLATE 100 MG PO TABS
100.0000 mg | ORAL_TABLET | Freq: Two times a day (BID) | ORAL | Status: DC
  Administered 2022-07-12 – 2022-07-13 (×3): 100 mg via ORAL
  Filled 2022-07-12 (×3): qty 1

## 2022-07-12 MED ORDER — CLOPIDOGREL BISULFATE 75 MG PO TABS
75.0000 mg | ORAL_TABLET | Freq: Every day | ORAL | Status: DC
  Administered 2022-07-12 – 2022-07-13 (×2): 75 mg via ORAL
  Filled 2022-07-12 (×2): qty 1

## 2022-07-12 NOTE — Progress Notes (Signed)
Initial Nutrition Assessment  DOCUMENTATION CODES:   Obesity unspecified  INTERVENTION:  Liberalized diet to regular. Encouraged intake of small, frequent meals in setting of decreased appetite and occasional nausea.  Provide Ensure Enlive/Ensure Plus High Protein po TID, each supplement provides 350 kcal and 20 grams of protein. Patient prefers chocolate.  Provide multivitamin with minerals daily.  NUTRITION DIAGNOSIS:   Inadequate oral intake related to decreased appetite, nausea as evidenced by per patient/family report.  GOAL:   Patient will meet greater than or equal to 90% of their needs  MONITOR:   PO intake, Supplement acceptance, Labs, Weight trends, I & O's  REASON FOR ASSESSMENT:   Malnutrition Screening Tool    ASSESSMENT:   46 year old female with PMHx of TIA, HLD, asthma, depression, anxiety, DM, hidradenitis suppurativa, migraines who is admitted with headache associated with numbness and tingling to right face and right upper extremity concerning for complex migraine vs cerebral infarction.  Met with patient at bedside. She reports decreased appetite for approximately one month prior to admission. Typical intake was 3 meals per day. For the past month she reports eating one meal per day. She gets a fish sandwich meal with fries and a drink from [employer]. RD estimates this meal provides 920 kcal, which meets 51% estimated needs. She endorses occasional nausea, constipation, and diarrhea in the past month. Typically has BM every other day. Denies emesis, abdominal pain, or difficulty chewing/swallowing. Appetite remains decreased at this time. Pt ate 75% of lunch yesterday. No other meal documentation recorded at this time. She would benefit from liberalized diet and initiation of oral nutrition supplement. She is agreeable to drinking Ensure supplements.   Patient reports UBW was 194 lbs and that she has lost 10 lbs in the past month. This is equivalent to 5.2%  weight loss over 1 month, which is significant for time frame. Pt was documented to be 87.1 kg (191.6 lbs on 03/08/22). No other recent weights documented in chart. Pt with overall wt gain over the past several years as documented to be 65-69 kg in 2021.  UOP: 1 occurrence unmeasured UOP   I/O: +240 mL since admission  Medications reviewed and none pertinent.  Labs reviewed: Cholesterol 242, LDL 163, HgbA1c 5.8.  Patient is at risk for malnutrition of social/environmental circumstances in setting of decreased appetite and inadequate oral intake, but does not quite meet criteria as intake has not been less than 75% for greater than or equal to 3 months.  NUTRITION - FOCUSED PHYSICAL EXAM:  Flowsheet Row Most Recent Value  Orbital Region No depletion  Upper Arm Region No depletion  Thoracic and Lumbar Region No depletion  Buccal Region No depletion  Temple Region No depletion  Clavicle Bone Region Mild depletion  Clavicle and Acromion Bone Region No depletion  Scapular Bone Region No depletion  Dorsal Hand No depletion  Patellar Region No depletion  Anterior Thigh Region No depletion  Posterior Calf Region No depletion  Edema (RD Assessment) None  Hair Unable to assess  [Pt reports hair on sides starting to thin]  Eyes Reviewed  Mouth Reviewed  Skin Reviewed  Nails Reviewed       Diet Order:   Diet Order             Diet Heart Room service appropriate? Yes; Fluid consistency: Thin  Diet effective now                   EDUCATION NEEDS:   No  education needs have been identified at this time  Skin:  Skin Assessment: Reviewed RN Assessment  Last BM:  PTA  Height:   Ht Readings from Last 1 Encounters:  07/11/22 _0  (1.626 m)    Weight:   Wt Readings from Last 1 Encounters:  07/11/22 83.5 kg    Ideal Body Weight:  54.5 kg  BMI:  Body mass index is 31.58 kg/m.  Estimated Nutritional Needs:   Kcal:  1800-2000  Protein:  100-110 grams  Fluid:   1.8-2 L/day  Loanne Drilling, MS, RD, LDN, CNSC Pager number available on Amion

## 2022-07-12 NOTE — Progress Notes (Signed)
  Echocardiogram 2D Echocardiogram has been performed.  Samantha [name] 07/12/2022, 3:57 PM

## 2022-07-12 NOTE — Progress Notes (Signed)
Patient alert and oriented x4, Patient states that she is not experiencing pain aside from a light headache, asked if pain meds were desired, patient verbalized not at this time. Plan of care continues.

## 2022-07-12 NOTE — Hospital Course (Signed)
Complex migraine vs cerebral infarction    Hyperlipidemia Cholesterol 242, LDL 163. Restart atorvastatin 40 mg daily.  Prediabetes A1c 5.8%.  Patient reports she was previously on metformin but stopped this due to weight loss.  Please continue to follow and ensure good blood glucose control given increased risk for ischemic vascular events.   Hidradenitis Suppurativa Patient endorses discomfort at bilateral axilla.  Left axilla with some drainage but not concerning for cellulitis.  Will discharge with doxycycline 100 mg twice daily.  Patient will benefit with follow-up with dermatology.  Complex migraine versus cerebral infarction: Patient presented with headache and associated numbness, tingling at the right face and right upper extremity.  She has a history of daily migraines for several years, though they have been worsening recently with additional visual disturbances.  Numbness and tingling 3 days ago but only lasted for 10 to 15 minutes.  Reports similar symptoms with TIA in 2017.  Typically takes ibuprofen for her right sided throbbing headaches.  Endorses vivid dreams that correspond to the timing of her headaches.  CT head with age-indeterminate lacunar infarct within the right thalamus, new since prior.  Follow-up MRI showed 2 small acute to subacute infarcts along the left frontal parietal and right frontal cortex.  CTA without evidence of LVO or hemodynamically significant stenosis of the neck.  Neurology reviewed imaging and felt findings most likely artifactual.  Differential included CVA versus complex migraine.

## 2022-07-12 NOTE — Progress Notes (Signed)
HD#0 Subjective:   Summary: 46 year old female living with hypertension, HLD, and chronic hidradenitis admitted for evaluation of acute right face parasthesias and headache.   Overnight Events: Patient complained of discomfort at bilateral axilla consistent with previous H.S. pain. Provided clindamycin cream.  Reports mild headache yesterday afternoon. States she typically takes tylenol and ibuprofen at home for her headaches but has never tried other headache medications. States she does not have a PCP. Saw dermatology several years ago for her H.S. but has not followed up with them since them. Dermatologist was located on [address]. She has taken doxycycline in the past. Endorses good appetite, hydration, and regular bowel movements. No headache this morning. Has not had return of numbness or tingling. States she gets depressed when her hydradenitis flares.  Objective:  Vital signs in last 24 hours: Vitals:   07/11/22 1028 07/11/22 1257 07/11/22 1921 07/12/22 0840  BP: 103/64 105/67 104/73 101/60  Pulse: 63 63 65 63  Resp: '15 18 16 18  '$ Temp: 98.4 F (36.9 C) 98.4 F (36.9 C) 98.2 F (36.8 C) 98.6 F (37 C)  TempSrc:  Oral Oral Oral  SpO2: 98% 100% 100% 100%  Weight:  83.5 kg    Height:  '5\' 4"'$  (1.626 m)     Supplemental O2: Room Air SpO2: 100 %   Physical Exam:  Constitutional: well-appearing female sitting in hospital bed, in no acute distress HENT: normocephalic atraumatic, mucous membranes moist Eyes: conjunctiva non-erythematous Neck: supple Cardiovascular: regular rate and rhythm, no m/r/g Pulmonary/Chest: normal work of breathing on room air, lungs clear to auscultation bilaterally Abdominal: soft, non-tender, non-distended MSK: normal bulk and tone Neurological: alert & oriented x 3, CN II - XII grossly intact, normal finger to nose, 5/5 strength in bilateral upper and lower extremities Skin: warm and dry Psych: Depressed mood  Filed Weights    07/11/22 1257  Weight: 83.5 kg     Intake/Output Summary (Last 24 hours) at 07/12/2022 1115 Last data filed at 07/12/2022 1047 Gross per 24 hour  Intake 358 ml  Output --  Net 358 ml   Net IO Since Admission: 358 mL [07/12/22 1115]  Pertinent Labs:    Latest Ref Rng & Units 07/11/2022    1:10 AM 02/09/2020    6:33 AM 02/08/2020    6:06 PM  CBC  WBC 4.0 - 10.5 K/uL 7.2  5.6  6.6   Hemoglobin 12.0 - 15.0 g/dL 11.5  14.0  14.4   Hematocrit 36.0 - 46.0 % 36.7  42.6  43.3   Platelets 150 - 400 K/uL 333  245  245        Latest Ref Rng & Units 07/11/2022    1:10 AM 02/09/2020    6:33 AM 02/08/2020    6:06 PM  CMP  Glucose 70 - 99 mg/dL 97  94  95   BUN 6 - 20 mg/dL '11  14  10   '$ Creatinine 0.44 - 1.00 mg/dL 1.00  0.90  0.81   Sodium 135 - 145 mmol/L 136  135  136   Potassium 3.5 - 5.1 mmol/L 3.6  4.1  4.5   Chloride 98 - 111 mmol/L 103  101  102   CO2 22 - 32 mmol/L '22  25  22   '$ Calcium 8.9 - 10.3 mg/dL 8.9  9.1  9.4   Total Protein 6.5 - 8.1 g/dL 7.6     Total Bilirubin 0.3 - 1.2 mg/dL 0.3  Alkaline Phos 38 - 126 U/L 72     AST 15 - 41 U/L 17     ALT 0 - 44 U/L 9       Imaging: No results found.  Assessment/Plan:   Principal Problem:   Migraine headache with aura Active Problems:   Hyperlipidemia   Tobacco use   TIA (transient ischemic attack)   Hidradenitis suppurativa   Patient Summary: Mrs. Gangwer is a 46 year old female living with hypertension, HLD, chronic hidradenitis, and a history of TIAs admitted for evaluation of acute right face parasthesias and headache.   Complex migraine vs cerebral infarction MRI brain with 2 small acute to subacute infarction on the left frontal parietal and right frontal cortex.  CTA head and neck without LVO or hemodynamically significant stenosis.  Currently undergoing stroke work-up, differential also includes complex migraine.  Patient would benefit from migraine prevention preferably CGRP inhibitors.  Patient is without  insurance and these will likely be cost prohibitive, will look into other options including Topamax, beta-blockers.  Not eligible for triptan therapy. -echo pending -transcranial doppler pending -LE [place] pending -hypercoagulopathy studies pending -Optimize cardiovascular risk factors   Prediabetes A1c 5.8%.  Patient reports she was previously on metformin but stopped this due to weight loss.  - Will need outpatient follow up and is interested in [facility]   Hyperlipidemia Cholesterol 242, LDL 163. Previously prescribed atorvastatin but does not think she ever took this. - atorvastatin 40 mg daily   Hidradenitis Suppurativa Previously on oral doxycycline. Reports she has recurrent episodes making it difficult for her to find work. Has small indurated area in the left axilla with some drainage but no erythema.  No concerns for cellulitis. -Doxycycline 100 mg twice daily -Warm compresses as needed -Follow-up with dermatology outpatient  Diet: Normal IVF: None,None VTE: Enoxaparin Code: Full   Dispo: Anticipated discharge to Home in 1 days pending completion of neurological workup.   Linward Natal MD Internal Medicine Resident PGY-1 Please contact the on call pager after 5 pm and on weekends at [phone].

## 2022-07-12 NOTE — Progress Notes (Addendum)
STROKE TEAM PROGRESS NOTE   ATTENDING NOTE: I reviewed above note and agree with the assessment and plan. Pt was seen and examined.   46 year old female with history of diabetes, hypertension, hyperlipidemia, TIA, basilar type migraine admitted for headache and right sided numbness and tingling.  Per chart, in 04/2015, patient had transient right arm and face tingling, as well as speech difficulty and blurry vision.  MRI and MRA, as well as carotid Doppler and 2D echo unremarkable.  LDL 139.  Discharged on aspirin.  30-day monitoring negative.  On this admission, CT no acute infarct, chronic right thalamic infarct.  MRI showed punctate right frontal and left MCA/ACA infarcts.  CT head and neck unremarkable.  2D echo EF 60 to 65%, TCD bubble study no PFO.  LDL 163, A1c 5.8.  Hypercoagulable work-up pending.  Creatinine 1.00.  On exam, patient no family at bedside.  AOx3, no aphasia, follows simple commands.  Neurologically intact no focal deficit.  Denies headache at this time.  Etiology for patient stroke not quite clear, could be migrainer's stroke versus synchronized small vessel disease versus cardioembolic source.  Continue aspirin and Plavix DAPT for 3 weeks and then aspirin alone.  Add Lipitor 40.  Recommend 30-day cardiac event monitoring as outpatient.  Aggressive stroke risk factor modification.  Follow-up with Dr. Lavell [name] at [facility] for headache management.  For detailed assessment and plan, please refer to above/below as I have made changes wherever appropriate.   Neurology will sign off. Please call with questions. Pt will follow up with Dr. Jaynee [name] at [facility] in about 4 weeks. Thanks for the consult.   Samantha Hawking, MD PhD Stroke Neurology 07/12/2022 10:20 PM    INTERVAL HISTORY Patient is seen at bedside.  She appears comfortable in no acute distress.  She reported mild headache when she wakes up this morning.  Patient has not noticed any neurological deficits except for mild and tingling on  the right side of her face.  She was seeing a neurologist in the past for her migraine but no longer follow-up with anyone.  Vitals:   07/11/22 1028 07/11/22 1257 07/11/22 1921 07/12/22 0840  BP: 103/64 105/67 104/73 101/60  Pulse: 63 63 65 63  Resp: '15 18 16 18  '$ Temp: 98.4 F (36.9 C) 98.4 F (36.9 C) 98.2 F (36.8 C) 98.6 F (37 C)  TempSrc:  Oral Oral Oral  SpO2: 98% 100% 100% 100%  Weight:  83.5 kg    Height:  '5\' 4"'$  (1.626 m)     CBC:  Recent Labs  Lab 07/11/22 0110  WBC 7.2  NEUTROABS 3.6  HGB 11.5*  HCT 36.7  MCV 88.6  PLT 540   Basic Metabolic Panel:  Recent Labs  Lab 07/11/22 0110  NA 136  K 3.6  CL 103  CO2 22  GLUCOSE 97  BUN 11  CREATININE 1.00  CALCIUM 8.9   Lipid Panel:  Recent Labs  Lab 07/11/22 0110  CHOL 242*  TRIG 54  HDL 68  CHOLHDL 3.6  VLDL 11  LDLCALC 163*   HgbA1c:  Recent Labs  Lab 07/11/22 0110  HGBA1C 5.8*   Urine Drug Screen: No results for input(s): "LABOPIA", "COCAINSCRNUR", "LABBENZ", "AMPHETMU", "THCU", "LABBARB" in the last 168 hours.  Alcohol Level No results for input(s): "ETH" in the last 168 hours.  IMAGING past 24 hours No results found.  PHYSICAL EXAM  Physical Exam Gen: A&O x4, NAD HEENT: Atraumatic, normocephalic;mucous membranes moist; oropharynx clear, tongue without atrophy or fasciculations.  Neck: Supple, trachea midline. Resp: No respiratory distress Extrem: Nml bulk; no cyanosis, clubbing, or edema.  Neuro: *MS: A&O x4. Follows multi-step commands. *Speech: fluent, nondysarthric. No aphasia. Naming and repetition intact.  *CN:    I: Deferred   II,III: PERRLA, VFF by confrontation, optic discs sharp   III,IV,VI: EOMI w/o nystagmus, no ptosis   V: Diminished sensation of right side   VII: Eyelid closure was full.  Smile symmetric.   VIII: Hearing intact to voice   IX,X: Voice normal, palate elevates symmetrically    XI: SCM/trap 5/5 bilat   XII: Tongue protrudes midline, no atrophy or  fasciculations   *Motor:   Normal bulk.  No tremor, rigidity or bradykinesia. No pronator drift.    Strength: Dlt Bic Tri WrE WrF FgS Gr HF KnF KnE PlF DoF    Left '5 5 5 5 5 5 5 5 5 5 5 5    '$ Right '5 5 5 5 5 5 5 5 5 5 5 5    '$ *Sensory: diminished sensation of her right upper extremity.  Sensation intact of bilateral lower extremities.  NIHSS: +1    ASSESSMENT/PLAN [name] is a 46 y.o. female with history of basilar migraine headache, hypertension, hyperlipidemia, chronic hidradenitis suppurativa, who presents to the hospital for 2 weeks of persistent headache associated with transient episodes of right arm/face numbness and tingling that resolved within 10 minutes and occurred 4 days ago.  Initial CT head showed age-indeterminate lacunar infarcts within the right thalamus.  MRI brain showed 2 small acute to subacute infarct along the left frontal parietal and right frontal cortex.  CTA head and neck did not show any large vessel occlusion or stenosis.  Her physical exam only significant for decreased sensation of her right face and right arm  This could be migraine induced CVA secondary to vasospasm however that does not explain her bilateral acute infarcts.  Given her age group and the lack of intracranial artery stenosis, we want to rule about thromboembolic stroke.  Pending work-up with echocardiogram, DVT ultrasound, transcranial Doppler with bubbles and hypercoagulable studies.  In the meantime continue aspirin and Plavix for 3 weeks then aspirin alone.  Continue atorvastatin 40 mg given LDL 163.  A1c well controlled.  Patient will need to follow-up with neurology outpatient for her migraine and new CVA.  Stroke: Left frontal parietal and right frontal cortex Etiology: Thromboembolic vs cryptogenic CT head: Age-indeterminate lacunar infarct within the right thalamus  CTA head & neck   No evidence of large vessel occlusion or  hemodynamically significant stenosis  neck. MRI  Two small acute to subacute infarcts along the left frontal parietal and right frontal cortex. 2D Echo pending DVT ultrasound pending Ultrasound transcranial Doppler with bubble Pending Hypercoagulable labs: Pending LDL 163 HgbA1c 5.8 VTE prophylaxis -Lovenox    Diet   Diet regular Room service appropriate? Yes; Fluid consistency: Thin   Therapy recommendations: Aspirin and Plavix for 3 weeks then aspirin alone Disposition: Home  Hypertension Home meds:  NA Output of permissive hypertension window  Current blood pressure. no additional blood pressure medication indicated.  Hyperlipidemia Home meds:  NA LDL 163, goal < 70 Add atorvastatin 40 mg Continue statin at discharge  Diabetes type II Controlled Home meds: Metformin HgbA1c 5.8, goal < 7.0  Snoring -Recommend outpatient sleep study  Other Stroke Risk Factors Former cigarette smoker Obesity, Body mass index is 31.58 kg/m., BMI >/= 30 associated with increased stroke risk, recommend weight loss, diet  and exercise as appropriate  Basilar migraines  Other Active Problems Hidradenitis suppurativa: Doxycycline per primary team  Stroke team will continue continue to follow  To contact Stroke Continuity provider, please refer to [email or web address] After hours, contact General Neurology

## 2022-07-13 ENCOUNTER — Other Ambulatory Visit: Payer: Self-pay | Admitting: Cardiology

## 2022-07-13 ENCOUNTER — Other Ambulatory Visit (HOSPITAL_COMMUNITY): Payer: Self-pay

## 2022-07-13 DIAGNOSIS — G459 Transient cerebral ischemic attack, unspecified: Secondary | ICD-10-CM

## 2022-07-13 DIAGNOSIS — I63413 Cerebral infarction due to embolism of bilateral middle cerebral arteries: Secondary | ICD-10-CM

## 2022-07-13 LAB — CARDIOLIPIN ANTIBODIES, IGG, IGM, IGA
Anticardiolipin IgA: <9 APL U/mL (ref 0–11)
Anticardiolipin IgG: <9 GPL U/mL (ref 0–14)
Anticardiolipin IgM: 10 MPL U/mL (ref 0–12)

## 2022-07-13 LAB — BETA-2-GLYCOPROTEIN I ABS, IGG/M/A
Beta-2 Glyco I IgG: <9 GPI IgG units (ref 0–20)
Beta-2-Glycoprotein I IgA: <9 GPI IgA units (ref 0–25)
Beta-2-Glycoprotein I IgM: <9 GPI IgM units (ref 0–32)

## 2022-07-13 LAB — HOMOCYSTEINE: Homocysteine: 9.3 umol/L (ref 0.0–14.5)

## 2022-07-13 MED ORDER — CLOPIDOGREL BISULFATE 75 MG PO TABS
75.0000 mg | ORAL_TABLET | Freq: Every day | ORAL | 0 refills | Status: AC
  Filled 2022-07-13: qty 19, 19d supply, fill #0

## 2022-07-13 MED ORDER — ATORVASTATIN CALCIUM 40 MG PO TABS
40.0000 mg | ORAL_TABLET | Freq: Every day | ORAL | 2 refills | Status: DC
  Filled 2022-07-13 – 2022-09-12 (×2): qty 30, 30d supply, fill #0

## 2022-07-13 MED ORDER — ASPIRIN 81 MG PO TBEC
81.0000 mg | DELAYED_RELEASE_TABLET | Freq: Every day | ORAL | 12 refills | Status: AC
  Filled 2022-07-13 – 2022-09-12 (×3): qty 30, 30d supply, fill #0

## 2022-07-13 MED ORDER — DOXYCYCLINE HYCLATE 100 MG PO TABS
100.0000 mg | ORAL_TABLET | Freq: Two times a day (BID) | ORAL | 0 refills | Status: AC
  Filled 2022-07-13: qty 60, 30d supply, fill #0

## 2022-07-13 NOTE — Progress Notes (Signed)
Discharge instructions (including medications) discussed with and copy provided to patient/caregiver 

## 2022-07-13 NOTE — Plan of Care (Signed)

## 2022-07-13 NOTE — Discharge Summary (Signed)
Name: Keonta Monceaux MRN: [id number] DOB: [birth year] 46 y.o. PCP: Pcp, No  Date of Admission: 07/11/2022 12:46 AM Date of Discharge: 07/13/2022 Attending Physician: Axel Filler, *  Discharge Diagnosis: 1. 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   aspirin EC 81 MG tablet Take 1 tablet (81 mg total) by mouth daily. Swallow whole.   atorvastatin 40 MG tablet Commonly known as: LIPITOR Take 1 tablet (40 mg total) by mouth daily.   benzonatate 100 MG capsule Commonly known as: TESSALON Take 1 capsule (100 mg total) by mouth every 8 (eight) hours.   clopidogrel 75 MG tablet Commonly known as: PLAVIX Take 1 tablet (75 mg total) by mouth daily for 19 days. Start taking on: July 14, 2022   doxycycline 100 MG tablet Commonly known as: VIBRA-TABS Take 1 tablet (100 mg total) by mouth 2 (two) times daily.   gabapentin 300 MG capsule Commonly known as: NEURONTIN Take 1 capsule (300 mg total) by mouth 3 (three) times daily as needed.   gabapentin 100 MG capsule Commonly known as: NEURONTIN Take 1 capsule (100 mg total) by mouth 3 (three) times daily.   metFORMIN 500 MG tablet Commonly known as: GLUCOPHAGE Take 1 tablet (500 mg total) by mouth 2 (two) times daily with a meal.        Disposition and follow-up:   Ms.Angelly Ephrata [place] was  discharged from [facility] in Good condition.  At the hospital follow up visit please address:  1.  Complex migraine vs cerebral infarction: Patient to discharge with aspirin and Plavix for 3 weeks and then aspirin alone.  Will provide 30-day cardiac event monitoring.  Please follow-up coagulopathy work-up and to ensure no return of paresthesias.  Patient would benefit from migraine prevention medication, Nurtec is too expensive for her given she is without insurance.  Patient should not receive triptans given her likely ischemic event.  Please discuss medication options while also working on orange card versus Medicaid.  Patient is to follow-up with Dr. Lavell Anchors at [facility] for headache management in about 4 weeks.  Prediabetes: A1c 5.8%.  Patient reports she was previously on metformin but stopped this due to weight loss.  Please follow closely to ensure appropriate management of her blood glucose to optimize her risk of ischemic events.   Hyperlipidemia: Cholesterol 242, LDL 163.  Patient will be discharged with atorvastatin 40 mg daily.   Hidradenitis Suppurativa: Patient with draining fistulous track at left axilla.  Will discharge with doxycycline 100 mg twice daily for 1 month.  Please coordinate follow-up with dermatology.  Snoring: Recommend outpatient sleep study.  2.  Labs / imaging needed at time of follow-up: Coagulopathy labs  3.  Pending labs/ test needing follow-up: Cardiac event monitor  Follow-up Appointments:  Follow-up Information     Sarina Ill  B, MD. Schedule an appointment as soon as possible for a visit in 1 month(s).   Specialty: Neurology Contact information: [address] [place] [address] [phone]                 Hospital Course by problem list: 1. Complex migraine vs cerebral infarction: Patient initially presented with headache and right-sided numbness and tingling.  Similar history in 04/2015  but also with speech difficulty and blurred vision diagnosed as TIA.  CT head with age-indeterminate lacunar infarct within the right thalamus, new from prior examination.  MRI brain with 2 punctate acute to subacute infarcts along the left frontal parietal and right frontal cortex.  CTA head and neck unremarkable.  DVT ultrasound normal.  Carotid Doppler and 2D echo unremarkable.  Transcranial Doppler bubble study with no PFO.  LDL 163, A1c 5.8.  RPR, HIV nonreactive.  Antithrombin III within normal limits. Remaining hypercoagulable work-up pending.  Differential includes migrainous stroke vs small vessel disease.  Patient without paresthesias since admission though still with occasional mild headache.  Patient to take Plavix and aspirin for 3 weeks and then continue aspirin alone thereafter.  Will add Lipitor 40 mg daily.  Patient will be mailed 30-day cardiac event monitor.  Patient will follow up with Dr. Jaynee Eagles at [facility] in about 4 weeks.   Discharge Exam:   BP 101/75 (BP Location: Right Arm)   Pulse 60   Temp 98.3 F (36.8 C) (Oral)   Resp 18   Ht '5\' 4"'$  (1.626 m)   Wt 83.5 kg   LMP 07/07/2022 Comment: currently on period  SpO2 98%   BMI 31.58 kg/m  Discharge exam:  Physical Exam Constitutional:      General: She is not in acute distress. HENT:     Head: Normocephalic and atraumatic.  Eyes:     Extraocular Movements: Extraocular movements intact.  Pulmonary:     Effort: Pulmonary effort is normal.  Musculoskeletal:     Cervical back: Neck supple.  Skin:    Comments: Nonpurulent, nonerythematous draining fistulous tract at left axilla.    Neurological:     Mental Status: She is alert and oriented to person, place, and time.     Cranial Nerves: No cranial nerve deficit.     Sensory: No sensory deficit.     Motor: No weakness.  Psychiatric:        Mood and Affect: Mood normal.        Behavior: Behavior normal.    Pertinent Labs, Studies, and Procedures:   ECHOCARDIOGRAM COMPLETE  Result Date: 07/12/2022    ECHOCARDIOGRAM REPORT   Patient Name:   SHALISE ROSADO [place] Date of Exam: 07/12/2022 Medical Rec #:  [id number]           Height:       64.0 in Accession #:    [id number]          Weight:       184.0 lb Date of Birth:  November 19, 1975            BSA:          1.888 m Patient Age:    46 years            BP:           104/73 mmHg Patient Gender: F                   HR:  70 bpm. Exam Location:  Inpatient Procedure: 2D Echo Indications:    Srtoke  History:        Patient has prior history of Echocardiogram examinations, most                 recent 04/22/2015. Risk Factors:Current Smoker and Dyslipidemia.  Sonographer:    Harvie Junior Referring Phys: [id number] [place]  1. Left ventricular ejection fraction, by estimation, is 60 to 65%. The left ventricle has normal function. The left ventricle has no regional wall motion abnormalities. Left ventricular diastolic parameters were normal.  2. Right ventricular systolic function is normal. The right ventricular size is normal. Tricuspid regurgitation signal is inadequate for assessing PA pressure.  3. The mitral valve is normal in structure. No evidence of mitral valve regurgitation. No evidence of mitral stenosis.  4. The aortic valve is normal in structure. Aortic valve regurgitation is not visualized. No aortic stenosis is present.  5. The inferior vena cava is normal in size with greater than 50% respiratory variability, suggesting right atrial pressure of 3 mmHg. Conclusion(s)/Recommendation(s): No intracardiac source of embolism detected on this transthoracic study. Consider a transesophageal echocardiogram to exclude cardiac source of embolism if clinically indicated. FINDINGS  Left Ventricle: Left ventricular ejection fraction, by estimation, is 60 to 65%. The left ventricle has normal function. The left ventricle has no regional wall motion abnormalities. The left ventricular internal cavity size  was normal in size. There is  no left ventricular hypertrophy. Left ventricular diastolic parameters were normal. Normal left ventricular filling pressure. Right Ventricle: The right ventricular size is normal. No increase in right ventricular wall thickness. Right ventricular systolic function is normal. Tricuspid regurgitation signal is inadequate for assessing PA pressure. Left Atrium: Left atrial size was normal in size. Right Atrium: Right atrial size was normal in size. Pericardium: There is no evidence of pericardial effusion. Mitral Valve: The mitral valve is normal in structure. No evidence of mitral valve regurgitation. No evidence of mitral valve stenosis. Tricuspid Valve: The tricuspid valve is normal in structure. Tricuspid valve regurgitation is not demonstrated. No evidence of tricuspid stenosis. Aortic Valve: The aortic valve is normal in structure. Aortic valve regurgitation is not visualized. No aortic stenosis is present. Aortic valve mean gradient measures 4.0 mmHg. Aortic valve peak gradient measures 6.8 mmHg. Aortic valve area, by VTI measures 2.04 cm. Pulmonic Valve: The pulmonic valve was normal in structure. Pulmonic valve regurgitation is mild. No evidence of pulmonic stenosis. Aorta: The aortic root is normal in size and structure. Venous: The inferior vena cava is normal in size with greater than 50% respiratory variability, suggesting right atrial pressure of 3 mmHg. IAS/Shunts: No atrial level shunt detected by color flow Doppler.  LEFT VENTRICLE PLAX 2D LVIDd:         4.00 cm     Diastology LVIDs:         2.60 cm     LV e' medial:    9.46 cm/s LV PW:         0.70 cm     LV E/e' medial:  7.6 LV IVS:        0.70 cm     LV e' lateral:   10.90 cm/s LVOT diam:     1.70 cm     LV E/e' lateral: 6.6 LV SV:         51 LV SV Index:   27 LVOT Area:     2.27 cm  LV Volumes (MOD)  LV vol d, MOD A2C: 52.1 ml LV vol d, MOD A4C: 70.5 ml LV vol s, MOD A2C: 23.2 ml LV vol s, MOD A4C: 25.4 ml LV SV MOD  A2C:     28.9 ml LV SV MOD A4C:     70.5 ml LV SV MOD BP:      38.3 ml RIGHT VENTRICLE RV Basal diam:  2.60 cm RV Mid diam:    2.40 cm RV S prime:     13.40 cm/s TAPSE (M-mode): 2.6 cm LEFT ATRIUM             Index        RIGHT ATRIUM          Index LA diam:        3.00 cm 1.59 cm/m   RA Area:     9.96 cm LA Vol (A2C):   27.9 ml 14.78 ml/m  RA Volume:   17.80 ml 9.43 ml/m LA Vol (A4C):   26.0 ml 13.77 ml/m LA Biplane Vol: 29.1 ml 15.41 ml/m  AORTIC VALVE                    PULMONIC VALVE AV Area (Vmax):    2.11 cm     PV Vmax:          0.91 m/s AV Area (Vmean):   1.83 cm     PV Peak grad:     3.3 mmHg AV Area (VTI):     2.04 cm     PR End Diast Vel: 2.29 msec AV Vmax:           130.00 cm/s AV Vmean:          89.800 cm/s AV VTI:            0.252 m AV Peak Grad:      6.8 mmHg AV Mean Grad:      4.0 mmHg LVOT Vmax:         121.00 cm/s LVOT Vmean:        72.500 cm/s LVOT VTI:          0.226 m LVOT/AV VTI ratio: 0.90  AORTA Ao Root diam: 2.80 cm Ao Asc diam:  2.80 cm MITRAL VALVE MV Area (PHT): 3.53 cm    SHUNTS MV Decel Time: 215 msec    Systemic VTI:  0.23 m MV E velocity: 72.10 cm/s  Systemic Diam: 1.70 cm MV A velocity: 74.60 cm/s MV E/A ratio:  0.97 Fransico Him MD Electronically signed by Fransico Him MD Signature Date/Time: 07/12/2022/4:08:08 PM    Final    VAS [place] TRANSCRANIAL DOPPLER W BUBBLES  Result Date: 07/12/2022  Transcranial Doppler with Bubble Patient Name:  ELVENIA GODDEN Dall  Date of Exam:   07/12/2022 Medical Rec #: [id number]            Accession #:    [id number] Date of Birth: July 22, 1976             Patient Gender: F Patient Age:   30 years Exam Location:  [facility] Procedure:      VAS [place] TRANSCRANIAL DOPPLER W BUBBLES Referring Phys: Cornelius Moras XU --------------------------------------------------------------------------------  Indications: Stroke. History: HX of migraine headaches, HLD, previous smoker, questionable hx of TIA,. Comparison Study: No previous exams Performing  Technologist: Jody Hill RVT, RDMS  Examination Guidelines: A complete evaluation includes B-mode imaging, spectral Doppler, color Doppler, and power Doppler as needed of all accessible portions of each vessel. Bilateral testing is considered  an integral part of a complete examination. Limited examinations for reoccurring indications may be performed as noted.  Summary: No HITS at rest or during Valsalva. Negative transcranial Doppler Bubble study with no evidence of right to left intracardiac communication.  A vascular evaluation was performed. The right middle cerebral artery was studied. An IV was inserted into the patient's left AC. Verbal informed consent was obtained.  *See table(s) above for TCD measurements and observations.    Preliminary    CT ANGIO HEAD NECK W WO CM  Result Date: 07/11/2022 CLINICAL DATA:  Stroke follow-up EXAM: CT ANGIOGRAPHY HEAD AND NECK TECHNIQUE: Multidetector CT imaging of the head and neck was performed using the standard protocol during bolus administration of intravenous contrast. Multiplanar CT image reconstructions and MIPs were obtained to evaluate the vascular anatomy. Carotid stenosis measurements (when applicable) are obtained utilizing NASCET criteria, using the distal internal carotid diameter as the denominator. RADIATION DOSE REDUCTION: This exam was performed according to the departmental dose-optimization program which includes automated exposure control, adjustment of the mA and/or kV according to patient size and/or use of iterative reconstruction technique. CONTRAST:  39m OMNIPAQUE IOHEXOL 350 MG/ML SOLN COMPARISON:  Same day CT brain and MRI Brain FINDINGS: CT HEAD FINDINGS Brain: See same day CT brain. Infarcts seen on same day brain MRI is not visualized due to CT technique. Vascular: See below for vascular findings. Skull: Normal. Negative for fracture or focal lesion. Sinuses/Orbits: Mucous retention cyst in the left maxillary sinus. Other: None. Review of  the MIP images confirms the above findings CTA NECK FINDINGS Aortic arch: Standard branching. Imaged portion shows no evidence of aneurysm or dissection. No significant stenosis of the major arch vessel origins. Right carotid system: No evidence of dissection, stenosis (50% or greater), or occlusion. Left carotid system: No evidence of dissection, stenosis (50% or greater), or occlusion. Vertebral arteries: Codominant. No evidence of dissection, stenosis (50% or greater), or occlusion. Skeleton: Enlarged and partially empty sella. Other neck: None Upper chest: Mild centrilobular emphysema. Small amount of tracheal secretions are noted. Review of the MIP images confirms the above findings CTA HEAD FINDINGS Anterior circulation: No significant stenosis, proximal occlusion, aneurysm, or vascular malformation. Posterior circulation: No significant stenosis, proximal occlusion, aneurysm, or vascular malformation. Venous sinuses: As permitted by contrast timing, patent. Anatomic variants: None Review of the MIP images confirms the above findings IMPRESSION: 1. No evidence of large vessel occlusion or hemodynamically significant stenosis neck. 2. See same day CT brain for additional findings. Infarcts seen on same day brain MRI is not visualized due to CT technique. Emphysema (ICD10-J43.9). Electronically Signed   By: HMarin RobertsM.D.   On: 07/11/2022 11:11   MR BRAIN WO CONTRAST  Result Date: 07/11/2022 CLINICAL DATA:  Headache, new or worsening EXAM: MRI HEAD WITHOUT CONTRAST TECHNIQUE: Multiplanar, multiecho pulse sequences of the brain and surrounding structures were obtained without intravenous contrast. COMPARISON:  Head CT from earlier today FINDINGS: Brain: Tiny area of diffusion hyperintensity along the left frontal parietal cortex and high right frontal cortex, marked on axial images. The diffusion signal is more intense on the right. These are less well seen on coronal acquisition due to spacing. These are  difficult to localize on ADC map but are presumably acute to subacute. Partially empty sella, nonspecific in isolation and intervally stable. Few FLAIR hyperintensities in the cerebral Cornellius Kropp matter. Notable subinsular FLAIR hyperintensity on the left without mass effect or volume loss, stable from a 2019 head CT. No hydrocephalus or  collection. Vascular: Major flow voids are preserved Skull and upper cervical spine: Normal marrow signal. Sinuses/Orbits: Retention cyst along the floor the left maxillary sinus. 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 Electronically Signed   By: Jorje Guild M.D.   On: 07/11/2022 06:50   CT HEAD WO CONTRAST (5MM)  Result Date: 07/11/2022 CLINICAL DATA:  Headache, new or worsening, neuro deficit (Age 63-49y) EXAM: CT HEAD WITHOUT CONTRAST TECHNIQUE: Contiguous axial images were obtained from the base of the skull through the vertex without intravenous contrast. RADIATION DOSE REDUCTION: This exam was performed according to the departmental dose-optimization program which includes automated exposure control, adjustment of the mA and/or kV according to patient size and/or use of iterative reconstruction technique. COMPARISON:  06/04/2018 FINDINGS: Brain: Normal anatomic configuration of the brain. There is an age-indeterminate lacunar infarct within the right thalamus, new since prior examination. No acute intracranial hemorrhage. No abnormal mass effect midline shift. No abnormal intra or extra-axial mass lesion. Ventricular size is normal. Cerebellum is unremarkable. Vascular: No hyperdense vessel or unexpected calcification. Skull: Normal. Negative for fracture or focal lesion. Sinuses/Orbits: No acute finding. Other: Mastoid air cells and middle ear cavities are clear. IMPRESSION: 1. 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 This could be confirmed and better  characterized with MRI examination. Electronically Signed   By: Fidela Salisbury M.D.   On: 07/11/2022 03:45    Recent Results (from the past 2160 hour(s))  CBC with Differential     Status: Abnormal   Collection Time: 07/11/22  1:10 AM  Result Value Ref Range   WBC 7.2 4.0 - 10.5 K/uL   RBC 4.14 3.87 - 5.11 MIL/uL   Hemoglobin 11.5 (L) 12.0 - 15.0 g/dL   HCT 36.7 36.0 - 46.0 %   MCV 88.6 80.0 - 100.0 fL   MCH 27.8 26.0 - 34.0 pg   MCHC 31.3 30.0 - 36.0 g/dL   RDW 13.2 11.5 - 15.5 %   Platelets 333 150 - 400 K/uL   nRBC 0.0 0.0 - 0.2 %   Neutrophils Relative % 49 %   Neutro Abs 3.6 1.7 - 7.7 K/uL   Lymphocytes Relative 40 %   Lymphs Abs 2.9 0.7 - 4.0 K/uL   Monocytes Relative 8 %   Monocytes Absolute 0.6 0.1 - 1.0 K/uL   Eosinophils Relative 2 %   Eosinophils Absolute 0.2 0.0 - 0.5 K/uL   Basophils Relative 1 %   Basophils Absolute 0.0 0.0 - 0.1 K/uL   Immature Granulocytes 0 %   Abs Immature Granulocytes 0.02 0.00 - 0.07 K/uL    Comment: Performed at [facility], [address] [place], [place] [address]  Comprehensive metabolic panel     Status: None   Collection Time: 07/11/22  1:10 AM  Result Value Ref Range   Sodium 136 135 - 145 mmol/L   Potassium 3.6 3.5 - 5.1 mmol/L   Chloride 103 98 - 111 mmol/L   CO2 22 22 - 32 mmol/L   Glucose, Bld 97 70 - 99 mg/dL    Comment: Glucose reference range applies only to samples taken after fasting for at least 8 hours.   BUN 11 6 - 20 mg/dL   Creatinine, Ser 1.00 0.44 - 1.00 mg/dL   Calcium 8.9 8.9 - 10.3 mg/dL   Total Protein 7.6 6.5 - 8.1 g/dL   Albumin 3.6 3.5 - 5.0 g/dL  AST 17 15 - 41 U/L   ALT 9 0 - 44 U/L   Alkaline Phosphatase 72 38 - 126 U/L   Total Bilirubin 0.3 0.3 - 1.2 mg/dL   GFR, Estimated >60 >60 mL/min    Comment: (NOTE) Calculated using the CKD-EPI Creatinine Equation (2021)    Anion gap 11 5 - 15    Comment: Performed at [facility] [address], [place], [place] [address]  Hemoglobin A1c      Status: Abnormal   Collection Time: 07/11/22  1:10 AM  Result Value Ref Range   Hgb A1c MFr Bld 5.8 (H) 4.8 - 5.6 %    Comment: (NOTE) Pre diabetes:          5.7%-6.4%  Diabetes:              >6.4%  Glycemic control for   <7.0% adults with diabetes    Mean Plasma Glucose 119.76 mg/dL    Comment: Performed at [facility] [address] [place], [place] [address]  Lipid panel     Status: Abnormal   Collection Time: 07/11/22  1:10 AM  Result Value Ref Range   Cholesterol 242 (H) 0 - 200 mg/dL   Triglycerides 54 <150 mg/dL   HDL 68 >40 mg/dL   Total CHOL/HDL Ratio 3.6 RATIO   VLDL 11 0 - 40 mg/dL   LDL Cholesterol 163 (H) 0 - 99 mg/dL    Comment:        Total Cholesterol/HDL:CHD Risk Coronary Heart Disease Risk Table                     Men   Women  1/2 Average Risk   3.4   3.3  Average Risk       5.0   4.4  2 X Average Risk   9.6   7.1  3 X Average Risk  23.4   11.0        Use the calculated Patient Ratio above and the CHD Risk Table to determine the patient's CHD Risk.        ATP III CLASSIFICATION (LDL):  <100     mg/dL   Optimal  100-129  mg/dL   Near or Above                    Optimal  130-159  mg/dL   Borderline  160-189  mg/dL   High  >190     mg/dL   Very High Performed at [facility] [address] [place], [place] [address]   I-Stat Beta hCG blood, ED ([facility], [facility], [facility] only)     Status: None   Collection Time: 07/11/22  1:58 AM  Result Value Ref Range   I-stat hCG, quantitative <5.0 <5 mIU/mL   Comment 3            Comment:   GEST. AGE      CONC.  (mIU/mL)   <=1 WEEK        5 - 50     2 WEEKS       50 - 500     3 WEEKS       100 - 10,000     4 WEEKS     1,000 - 30,000        FEMALE AND NON-PREGNANT FEMALE:     LESS THAN 5 mIU/mL   HIV Antibody (routine testing w rflx)  Status: None   Collection Time: 07/12/22  4:24 AM  Result Value Ref Range   HIV Screen 4th Generation wRfx Non Reactive Non Reactive    Comment: Performed at [facility], [place] [address] [place], [place] [address]  Vitamin B12     Status: None   Collection Time: 07/12/22  8:36 AM  Result Value Ref Range   Vitamin B-12 588 180 - 914 pg/mL    Comment: (NOTE) This assay is not validated for testing neonatal or myeloproliferative syndrome specimens for Vitamin B12 levels. Performed at [facility], [place] [address], [place], [place] [address]   RPR     Status: None   Collection Time: 07/12/22  8:36 AM  Result Value Ref Range   RPR Ser Ql NON REACTIVE NON REACTIVE    Comment: Performed at [facility], [place] [address], [place], [place] [address]  Rapid urine drug screen (hospital performed)     Status: None   Collection Time: 07/12/22  3:03 PM  Result Value Ref Range   Opiates NONE DETECTED NONE DETECTED   Cocaine NONE DETECTED NONE DETECTED   Benzodiazepines NONE DETECTED NONE DETECTED   Amphetamines NONE DETECTED NONE DETECTED   Tetrahydrocannabinol NONE DETECTED NONE DETECTED   Barbiturates NONE DETECTED NONE DETECTED    Comment: (NOTE) DRUG SCREEN FOR MEDICAL PURPOSES ONLY.  IF CONFIRMATION IS NEEDED FOR ANY PURPOSE, NOTIFY LAB WITHIN 5 DAYS.  LOWEST DETECTABLE LIMITS FOR URINE DRUG SCREEN Drug Class                     Cutoff (ng/mL) Amphetamine and metabolites    1000 Barbiturate and metabolites    200 Benzodiazepine                 200 Opiates and metabolites        300 Cocaine and metabolites        300 THC                            50 Performed at [facility], [place] [address], [place], [place] [address]   ECHOCARDIOGRAM COMPLETE     Status: None   Collection Time: 07/12/22  3:57 PM  Result Value Ref Range   Weight 2,944 oz   Height 64 in   BP 101/60 mmHg   Single Plane A2C EF 55.5 %   Single Plane A4C EF 64.0 %   Calc EF 60.1 %   S' Lateral 2.60 cm   AR max vel 2.11 cm2   AV Area VTI 2.04 cm2   AV Mean grad 4.0 mmHg   AV Peak grad 6.8 mmHg   Ao pk vel 1.30 m/s   Area-P 1/2 3.53 cm2   AV Area  mean vel 1.83 cm2  Antithrombin III     Status: None   Collection Time: 07/12/22  4:01 PM  Result Value Ref Range   AntiThromb III Func 110 75 - 120 %    Comment: Performed at [facility] [address], [place], [place] [address]     Discharge Instructions: Discharge Instructions     Ambulatory referral to Neurology   Complete by: As directed    Follow up with Dr. Jaynee Eagles at [facility] in 4-6 weeks for HA. Thanks.   Diet - low sodium heart healthy   Complete by: As directed    Discharge instructions  Complete by: As directed    Ms. Reinecke,  It was a pleasure caring for you during your admission here at [facility].  You came in with numbness and tingling and worsening headaches.  This is likely related to your migraines and/or small strokes.  Several tests are still pending and this will be followed up at your appointment at [facility] in 4 weeks.  Please note the following items: -Take Plavix 75 mg once daily for 3 weeks -Take aspirin 81 mg daily -Take Lipitor 40 mg daily -Take doxycycline 100 mg twice daily until your follow-up with dermatology -A cardiac event monitor will be mailed to your house with instructions -Our clinic should be reaching out to you to schedule an appointment in the next week or so.  Please discuss referral to dermatology at this appointment.  You can also discuss migraine prevention medications at this time as well. -Please go to your appointment with [facility] in about 4 weeks.  They should reach out to you to confirm date and time of appointment.   Increase activity slowly   Complete by: As directed        Signed: Linward Natal, MD 07/13/2022, 10:35 AM   Pager: [phone]

## 2022-07-13 NOTE — TOC Initial Note (Signed)
Transition of Care [name]) - Initial/Assessment Note    Patient Details  Name: Samantha [name] MRN: [id number] Date of Birth: [birth year]  [facility] CM/SW Contact:    Curlene Labrum, RN Phone Number: 07/13/2022, 11:42 AM  Clinical Narrative:                 CM met with the patient at the bedside prior to discharge.  The patient lives alone and recently has been unable to work due to health problems.  The patient was set up for [place] follow up at the clinic.  Appointment is noted in the discharge instructions.  Expected Discharge Plan: Home/Self Care Barriers to Discharge: No Barriers Identified   Patient Goals and CMS Choice Patient states their goals for this hospitalization and ongoing recovery are:: To return home CMS Medicare.gov Compare Post Acute Care list provided to:: Patient Choice offered to / list presented to : Patient  Expected Discharge Plan and Services Expected Discharge Plan: Home/Self Care   Discharge Planning Services: CM Consult   Living arrangements for the past 2 months: Apartment Expected Discharge Date: 07/13/22                                    Prior Living Arrangements/Services Living arrangements for the past 2 months: Apartment Lives with:: Self Patient language and need for interpreter reviewed:: Yes Do you feel safe going back to the place where you live?: Yes      Need for Family Participation in Patient Care: Yes (Comment) Care giver support system in place?: Yes (comment)   Criminal Activity/Legal Involvement Pertinent to Current Situation/Hospitalization: No - Comment as needed  Activities of Daily Living Home Assistive Devices/Equipment: Contact lenses ADL Screening (condition at time of admission) Patient's cognitive ability adequate to safely complete daily activities?: Yes Is the patient deaf or have difficulty hearing?: No Does the patient have difficulty seeing, even when wearing  glasses/contacts?: No Does the patient have difficulty concentrating, remembering, or making decisions?: No Patient able to express need for assistance with ADLs?: Yes Does the patient have difficulty dressing or bathing?: No Independently performs ADLs?: Yes (appropriate for developmental age) Does the patient have difficulty walking or climbing stairs?: No Weakness of Legs: None Weakness of Arms/Hands: None  Permission Sought/Granted Permission sought to share information with : Case Manager, PCP       Permission granted to share info w AGENCY: PCP set up through [place]        Emotional Assessment Appearance:: Appears stated age Attitude/Demeanor/Rapport: Gracious Affect (typically observed): Accepting Orientation: : Oriented to Self, Oriented to Place, Oriented to  Time, Oriented to Situation Alcohol / Substance Use: Not Applicable Psych Involvement: No (comment)  Admission diagnosis:  Migraine headache with aura [G43.109] Acute nonintractable headache, unspecified headache type [R51.9] Cerebrovascular accident (CVA), unspecified mechanism ([place]) [I63.9] Patient Active Problem List   Diagnosis Date Noted   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 ([place]) 07/13/2022   Migraine headache with aura 07/11/2022   Amenorrhea 09/17/2017   Adjustment disorder with mixed anxiety and depressed mood 06/26/2016   Depressed mood 06/26/2016   Back pain 01/31/2016   Caffeine abuse, continuous ([place]) 01/31/2016   Acute pericarditis 12/26/2015   Hidradenitis suppurativa 11/07/2015   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 [place] [address] Phone: [phone] Fax: [place] [address] [place] [address] Phone: [phone] Fax: [phone]     Social Determinants of Health (SDOH) Interventions    Readmission Risk Interventions    07/13/2022   11:42 AM  Readmission Risk Prevention Plan  Post Dischage Appt Complete  Medication Screening Complete  Transportation Screening Complete

## 2022-07-14 LAB — PROTEIN S, TOTAL: Protein S Ag, Total: 79 % (ref 60–150)

## 2022-07-14 LAB — ANA W/REFLEX IF POSITIVE: Anti Nuclear Antibody (ANA): NEGATIVE

## 2022-07-14 LAB — LUPUS ANTICOAGULANT PANEL
DRVVT: 35.5 s (ref 0.0–47.0)
PTT Lupus Anticoagulant: 35.1 s (ref 0.0–43.5)

## 2022-07-14 LAB — PROTEIN S ACTIVITY: Protein S Activity: 61 % — ABNORMAL LOW (ref 63–140)

## 2022-07-14 LAB — PROTEIN C ACTIVITY: Protein C Activity: 123 % (ref 73–180)

## 2022-07-15 LAB — PROTEIN C, TOTAL: Protein C, Total: 98 % (ref 60–150)

## 2022-07-18 LAB — FACTOR 5 LEIDEN

## 2022-07-19 ENCOUNTER — Ambulatory Visit: Payer: Medicaid Other | Attending: Cardiology

## 2022-07-19 DIAGNOSIS — G459 Transient cerebral ischemic attack, unspecified: Secondary | ICD-10-CM

## 2022-07-19 DIAGNOSIS — I63413 Cerebral infarction due to embolism of bilateral middle cerebral arteries: Secondary | ICD-10-CM

## 2022-07-19 LAB — PROTHROMBIN GENE MUTATION

## 2022-07-24 ENCOUNTER — Ambulatory Visit (INDEPENDENT_AMBULATORY_CARE_PROVIDER_SITE_OTHER): Payer: Self-pay | Admitting: Internal Medicine

## 2022-07-24 ENCOUNTER — Other Ambulatory Visit (HOSPITAL_COMMUNITY): Payer: Self-pay

## 2022-07-24 VITALS — BP 105/74 | HR 97 | Temp 98.1°F | Ht 64.0 in | Wt 198.8 lb

## 2022-07-24 DIAGNOSIS — I63413 Cerebral infarction due to embolism of bilateral middle cerebral arteries: Secondary | ICD-10-CM

## 2022-07-24 DIAGNOSIS — G43109 Migraine with aura, not intractable, without status migrainosus: Secondary | ICD-10-CM

## 2022-07-24 DIAGNOSIS — Z5971 Insufficient health insurance coverage: Secondary | ICD-10-CM

## 2022-07-24 DIAGNOSIS — Z5989 Other problems related to housing and economic circumstances: Secondary | ICD-10-CM

## 2022-07-24 MED ORDER — TOPIRAMATE 25 MG PO TABS
25.0000 mg | ORAL_TABLET | Freq: Two times a day (BID) | ORAL | 0 refills | Status: DC
  Filled 2022-07-24 – 2022-09-13 (×2): qty 60, 30d supply, fill #0

## 2022-07-24 NOTE — Patient Instructions (Addendum)
Dear Mrs. Gamboa,  Thank you for trusting [place] with your care today.   We discussed your recent strokes, migraines, and insurance.  Please continue to take the aspirin, statin, and plavix as you have been.  For the migraines, I have started a medicine called topiramate for you. Please take this medicine twice daily to help prevent headaches. It is okay to take ibuprofen occasionally for breakthrough headaches.  I have placed a referral to social work. Please complete the orange card paperwork and we can then get you referred for a sleep study and dermatology appointment.   Please return in 3 months for a follow up.

## 2022-07-24 NOTE — Progress Notes (Signed)
   CC: new to establish  HPI:[name] is a 46 y.o. female who presents for evaluation of CVA hospital follow up. Please see individual problem based A/P for details.  37 yof hx of cerebral infarction due to bilateral emolism of MCA, migraines with aura, HS, back pain, depressed mood/anxiety, hx pid with toa  Patient was discharged from hospital with aspirin, statin, and plavix. She has been taking these medications as prescribed. Does not small bruise on her thigh, but has otherwise been tolerating medications well. Denies any persisting symptoms. She is not having any numbness or weakness today.  Patient reports she is doing well today. She does report possible early signs of headache on right side beginning today. Migraines unilateral with aura. Headaches have been noted to be worse in the morning. She was using daily NSAIds. She was not able to start Nurtec due to price.    Depression, PHQ-9: Based on the patients  [place] Visit from 03/24/2021 in Primary Care at [facility] Total Score 5      score we have .  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  No murmurs, rubs, or gallops Pulmonary : Equal breath sounds, No wheezes, rales, or rhonchi Abdominal: soft, nontender,   bowel sounds present Ext: No edema in lower extremities, no tenderness to palpation of lower extremities.  Neuro: CN grossly intact. Strength and sensation of bilateral upper and lower extremities intact. No focal findings.   Assessment & Plan:   See Encounters Tab for problem based charting.  No focal findings on neuro exam. Fortunately, patient is asymptotic today and her stroke symptoms appear to have resolved. She is compliant with her regimen and taking it as directed. Will plan to continue aspirin and plavix until 23rd, then just aspirin alone. Continue statin.   Possible she may also have multifactorial headaches occurring. She has hx of OSA which may be exacerbating morning headaches. Additionally, given the daily nsaid use, she may also have component of medication overuse. Would benefit from preventative therapy. Nurtec is too expensive for her. She cannot due triptan due to ischemic event. Will trial patient on topiramate.   Patient discussed with Dr. Angelia Mould

## 2022-07-27 ENCOUNTER — Encounter: Payer: Self-pay | Admitting: Internal Medicine

## 2022-07-27 DIAGNOSIS — Z5971 Insufficient health insurance coverage: Secondary | ICD-10-CM | POA: Insufficient documentation

## 2022-07-27 DIAGNOSIS — Z5989 Other problems related to housing and economic circumstances: Secondary | ICD-10-CM | POA: Insufficient documentation

## 2022-07-27 NOTE — Assessment & Plan Note (Signed)
Patient without insurance. Would benefit from referral for sleep study to evaluate OSA and referral to neurology. She will need to work with social work to get orange card set up  before she can afford these referrals.

## 2022-07-27 NOTE — Assessment & Plan Note (Signed)
Patient was discharged from hospital with aspirin, statin, and plavix. She has been taking these medications as prescribed. Does not small bruise on her thigh, but has otherwise been tolerating medications well. Denies any persisting symptoms. She is not having any numbness or weakness today.  No focal findings on neuro exam. Fortunately, patient is asymptotic today and her stroke symptoms appear to have resolved. She is compliant with her regimen and taking it as directed. Will plan to continue aspirin and plavix until 23rd, then just aspirin alone. Continue statin.

## 2022-07-29 DIAGNOSIS — I63413 Cerebral infarction due to embolism of bilateral middle cerebral arteries: Secondary | ICD-10-CM

## 2022-07-29 DIAGNOSIS — G459 Transient cerebral ischemic attack, unspecified: Secondary | ICD-10-CM

## 2022-08-01 ENCOUNTER — Other Ambulatory Visit (HOSPITAL_COMMUNITY): Payer: Self-pay

## 2022-08-01 NOTE — Progress Notes (Signed)
Internal Medicine Clinic Attending  Case discussed with the resident at the time of the visit.  We reviewed the resident's history and exam and pertinent patient test results.  I agree with the assessment, diagnosis, and plan of care documented in the resident's note.  

## 2022-08-20 NOTE — Progress Notes (Signed)
Cardiology Office Note:    Date:  08/22/2022   ID:  Devlin Mcveigh, DOB 08-08-1976, MRN [id number]  PCP:  [facility]  Cardiologist:  Buford Dresser, MD  Referring MD: No ref. provider found   CC: New patient evaluation to re-establish care  History of Present Illness:    Samantha [name] is a 46 y.o. female with a hx of back pain, ovarian cyst, tobacco abuse, palpitations  who is seen today for post-hospital follow-up and to re-establish care. I previously saw her in clinic 04/2018 as a new consult at the request of Langston Masker for the evaluation and management of postural tachycardia.   On 07/11/22 she was admitted to the hospital after presenting with headache and right-sided numbness. CT head with age-indeterminate lacunar infarct within the right thalamus, new from prior examination. MRI brain with 2 punctate acute to subacute infarcts along the left frontal parietal and right frontal cortex. CTA head and neck unremarkable. DVT ultrasound normal. Carotid Doppler and 2D echo unremarkable. Transcranial Doppler bubble study with no PFO. LDL 163, A1c 5.8. She was prescribed atorvastatin 40 mg daily. She was discharged on Plavix for 3 weeks and then aspirin alone. A 30-day cardiac event monitor was mailed.   Today, she complains of palpitations which she describes as "funny beats". The palpitations are happening mostly while lying or sitting down. She also states that the frequency will vary, but they are still occurring every day and every night. Sometimes her palpitations are rapid and she will sit up and focus on breathing.  Occasionally she feels a little dizzy and maybe like she will pass out. She believes this is more related to starting an exercise routine recently. Every 2-3 days she walks a long a trail for 1.4 miles both ways. Afterwards she feels exhausted and has pain in her feet and legs. At times she feels swollen on the soles of her  feet, with tingling sensations in her legs.  Since she was last in the hospital she has had some mild headaches, but not as severe as they were in the past. Additionally she complains of constant shortness of breath that is stable.  At this time she has stopped smoking black and milds. Lately she has been vaping.  She is compliant with 81 mg ASA and endorses easy bruising.  She denies any chest pain, orthopnea, or PND.  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   Depression    Phreesia 08/25/2020   Diabetes mellitus without complication ([place])    Phreesia 08/25/2020   Hydradenitis    Hyperlipidemia    TIA (transient ischemic attack) 04/2015   questionable, most likely complicated migraine    Past Surgical History:  Procedure Laterality Date   CERVICAL BIOPSY  W/ LOOP ELECTRODE EXCISION     ESOPHAGOGASTRODUODENOSCOPY (EGD) WITH PROPOFOL N/A 10/14/2018   Procedure: ESOPHAGOGASTRODUODENOSCOPY (EGD) WITH PROPOFOL;  Surgeon: Wilford Corner, MD;  Location: [facility] ENDOSCOPY;  Service: Endoscopy;  Laterality: N/A;   LEEP      Current Medications: Current Outpatient Medications on File Prior to Visit  Medication Sig   albuterol (VENTOLIN HFA) 108 (90 Base) MCG/ACT inhaler Inhale 2 puffs into the lungs every 4 (four) hours as needed for wheezing or shortness of breath.   aspirin EC 81 MG tablet Take 1 tablet (81 mg total) by mouth daily. Swallow  whole.   atorvastatin (LIPITOR) 40 MG tablet Take 1 tablet (40 mg total) by mouth daily.   metFORMIN (GLUCOPHAGE) 500 MG tablet Take 1 tablet (500 mg total) by mouth 2 (two) times daily with a meal.   topiramate (TOPAMAX) 25 MG tablet Take 1 tablet (25 mg total) by mouth 2 (two) times daily.   benzonatate (TESSALON) 100 MG capsule Take 1 capsule (100 mg total) by mouth every 8 (eight) hours. (Patient not taking: Reported on 08/21/2022)   gabapentin (NEURONTIN) 100 MG  capsule Take 1 capsule (100 mg total) by mouth 3 (three) times daily. (Patient not taking: Reported on 08/21/2022)   gabapentin (NEURONTIN) 300 MG capsule Take 1 capsule (300 mg total) by mouth 3 (three) times daily as needed. (Patient not taking: Reported on 08/21/2022)   No current facility-administered medications on file prior to visit.     Allergies:   Patient has no known allergies.   Social History   Tobacco Use   Smoking status: Former    Packs/day: 1.00    Years: 17.00    Total pack years: 17.00    Types: Cigars, Cigarettes   Smokeless tobacco: Never   Tobacco comments:    quit 02/08/2020  Vaping Use   Vaping Use: Some days  Substance Use Topics   Alcohol use: Yes    Alcohol/week: 1.0 standard drink of alcohol    Types: 1 Shots of liquor per week    Comment: drink on the weekends   Drug use: Not Currently    Frequency: 3.0 times per week    Types: Marijuana  Per office note 04/2018: Drinks a lot of soda (Dr. Malachi Bonds) every day, thinks she drinks 4-5 cans/day. Also drinks water/jioce throughout the day.Trying to cut back but can smoke up to 1 pack of black & milds per day.    Family History: family history includes Cancer in her sister; Diabetes in her father, maternal grandmother, and sister; Hypertension in her mother; Lupus in her mother; Thyroid disease in her sister. There is no history of Anesthesia problems, Other, or Migraines. Mom: heart failure, Father: unclear heart history, thinks he has a heart issue.   ROS:   Please see the history of present illness. (+) Palpitations (+) Dizziness (+) Fatigue (+) BLE tingling, pain (+) Shortness of breath (+) Headaches (+) Easy bruising All other systems are reviewed and negative.   EKGs/Labs/Other Studies Reviewed:    The following studies were reviewed today:  Echo  07/12/2022:  1. Left ventricular ejection fraction, by estimation, is 60 to 65%. The  left ventricle has normal function. The left ventricle has  no regional  wall motion abnormalities. Left ventricular diastolic parameters were  normal.   2. Right ventricular systolic function is normal. The right ventricular  size is normal. Tricuspid regurgitation signal is inadequate for assessing  PA pressure.   3. The mitral valve is normal in structure. No evidence of mitral valve  regurgitation. No evidence of mitral stenosis.   4. The aortic valve is normal in structure. Aortic valve regurgitation is  not visualized. No aortic stenosis is present.   5. The inferior vena cava is normal in size with greater than 50%  respiratory variability, suggesting right atrial pressure of 3 mmHg.   Conclusion(s)/Recommendation(s): No intracardiac source of embolism  detected on this transthoracic study. Consider a transesophageal  echocardiogram to exclude cardiac source of embolism if clinically  indicated.   CTA Head/Neck  07/11/2022: IMPRESSION: 1. No evidence of large vessel  occlusion or hemodynamically significant stenosis neck.   2. See same day CT brain for additional findings. Infarcts seen on same day brain MRI is not visualized due to CT technique.   Emphysema (ICD10-J43.9).  Echo 04/22/15 Left ventricle: The cavity size was normal. Wall thickness was   normal. Systolic function was normal. The estimated ejection   fraction was in the range of 50% to 55%. Wall motion was normal;   there were no regional wall motion abnormalities. - Mitral valve: There was mild regurgitation.   Monitor 06/14/15 30 day monitor shows normal sinus rhythm.  No atrial fibrillation.  No significant ectopy.  No marked tachycardia or bradycardia.  Normal 30 day monitor.  EKG:  EKG is personally reviewed.   08/21/2022: not ordered today 05/06/2018:  Normal sinus rhythm with sinus arrhythmia  Recent Labs: 07/11/2022: ALT 9; BUN 11; Creatinine, Ser 1.00; Hemoglobin 11.5; Platelets 333; Potassium 3.6; Sodium 136   Recent Lipid Panel    Component Value  Date/Time   CHOL 242 (H) 07/11/2022 0110   TRIG 54 07/11/2022 0110   HDL 68 07/11/2022 0110   CHOLHDL 3.6 07/11/2022 0110   VLDL 11 07/11/2022 0110   LDLCALC 163 (H) 07/11/2022 0110    Physical Exam:    VS:  BP 106/72 (BP Location: Right Arm, Patient Position: Sitting, Cuff Size: Large)   Pulse 79   Ht '5\' 4"'$  (1.626 m)   Wt 191 lb 12.8 oz (87 kg)   SpO2 99%   BMI 32.92 kg/m     Wt Readings from Last 3 Encounters:  08/21/22 191 lb 12.8 oz (87 kg)  07/24/22 198 lb 12.8 oz (90.2 kg)  07/11/22 184 lb (83.5 kg)    GEN: Well nourished, well developed in no acute distress HEENT: Normal, moist mucous membranes NECK: No JVD CARDIAC: regular rhythm, normal S1 and S2, no rubs or gallops. No murmur. VASCULAR: Radial and DP pulses 2+ bilaterally. No carotid bruits RESPIRATORY:  Clear to auscultation without rales, wheezing or rhonchi  ABDOMEN: Soft, non-tender, non-distended MUSCULOSKELETAL:  Ambulates independently SKIN: Warm and dry, no edema NEUROLOGIC:  Alert and oriented x 3. No focal neuro deficits noted. PSYCHIATRIC:  Normal affect    ASSESSMENT:    1. History of lacunar cerebrovascular accident (CVA)   2. Heart palpitations   3. Cardiac risk counseling   4. Counseling on health promotion and disease prevention    PLAN:    CVA Palpitations -she has her monitor with her today, has not mailed back. Only wore for about 14 days. Instructed on how to return, will contact her once we have results.  -continue aspirin, atorvastatin   Cardiac risk counseling and prevention recommendations: -recommend heart healthy/Mediterranean diet, with whole grains, fruits, vegetable, fish, lean meats, nuts, and olive oil. Limit salt. -recommend moderate walking, 3-5 times/week for 30-50 minutes each session. Aim for at least 150 minutes.week. Goal should be pace of 3 miles/hours, or walking 1.5 miles in 30 minutes -recommend avoidance of tobacco products. Avoid excess alcohol.  Plan for  follow up: 1 year or sooner as needed, depending on monitor results  Buford Dresser, MD, PhD, [facility]    Medication Adjustments/Labs and Tests Ordered: Current medicines are reviewed at length with the patient today.  Concerns regarding medicines are outlined above.   No orders of the defined types were placed in this encounter.  No orders of the defined types were placed in this encounter.  Patient Instructions  Medication Instructions:  Your Physician recommend you continue on your current medication as directed.    *If you need a refill on your cardiac medications before your next appointment, please call your pharmacy*   Follow-Up: At [facility], you and your health needs are our priority.  As part of our continuing mission to provide you with exceptional heart care, we have created designated Provider Care Teams.  These Care Teams include your primary Cardiologist (physician) and Advanced Practice Providers (APPs -  Physician Assistants and Nurse Practitioners) who all work together to provide you with the care you need, when you need it.  We recommend signing up for the patient portal called "MyChart".  Sign up information is provided on this After Visit Summary.  MyChart is used to connect with patients for Virtual Visits (Telemedicine).  Patients are able to view lab/test results, encounter notes, upcoming appointments, etc.  Non-urgent messages can be sent to your provider as well.   To learn more about what you can do with MyChart, go to [email or web address].    Your next appointment:   1 year(s)  The format for your next appointment:   In Person  Provider:   Buford Dresser, MD       [facility],acting as a scribe for Buford Dresser, MD.,have documented all relevant documentation on the behalf of Buford Dresser, MD,as directed by  Buford Dresser, MD while in the presence of Buford Dresser, MD.  I, Buford Dresser, MD, have reviewed all documentation for this visit. The documentation on 08/22/22 for the exam, diagnosis, procedures, and orders are all accurate and complete.   Signed, Buford Dresser, MD PhD 08/22/2022 4:40 PM    [facility]

## 2022-08-21 ENCOUNTER — Encounter (HOSPITAL_BASED_OUTPATIENT_CLINIC_OR_DEPARTMENT_OTHER): Payer: Self-pay | Admitting: Cardiology

## 2022-08-21 ENCOUNTER — Ambulatory Visit (INDEPENDENT_AMBULATORY_CARE_PROVIDER_SITE_OTHER): Payer: Medicaid Other | Admitting: Cardiology

## 2022-08-21 VITALS — BP 106/72 | HR 79 | Ht 64.0 in | Wt 191.8 lb

## 2022-08-21 DIAGNOSIS — R002 Palpitations: Secondary | ICD-10-CM

## 2022-08-21 DIAGNOSIS — Z7189 Other specified counseling: Secondary | ICD-10-CM | POA: Diagnosis not present

## 2022-08-21 DIAGNOSIS — Z8673 Personal history of transient ischemic attack (TIA), and cerebral infarction without residual deficits: Secondary | ICD-10-CM

## 2022-08-21 NOTE — Patient Instructions (Signed)
Medication Instructions:  Your Physician recommend you continue on your current medication as directed.    *If you need a refill on your cardiac medications before your next appointment, please call your pharmacy*  Follow-Up: At [facility], you and your health needs are our priority.  As part of our continuing mission to provide you with exceptional heart care, we have created designated Provider Care Teams.  These Care Teams include your primary Cardiologist (physician) and Advanced Practice Providers (APPs -  Physician Assistants and Nurse Practitioners) who all work together to provide you with the care you need, when you need it.  We recommend signing up for the patient portal called "MyChart".  Sign up information is provided on this After Visit Summary.  MyChart is used to connect with patients for Virtual Visits (Telemedicine).  Patients are able to view lab/test results, encounter notes, upcoming appointments, etc.  Non-urgent messages can be sent to your provider as well.   To learn more about what you can do with MyChart, go to [email or web address].    Your next appointment:   1 year(s)  The format for your next appointment:   In Person  Provider:   Bridgette Christopher, MD  

## 2022-08-22 ENCOUNTER — Telehealth: Payer: Self-pay | Admitting: Licensed Clinical Social Worker

## 2022-08-22 NOTE — Progress Notes (Unsigned)
Heart and Vascular Care Navigation  08/22/2022  Samantha [name] 1976/06/30 [id number]  Reason for Referral: new Medicaid enrollee Patient is participating in a Managed Medicaid Plan:Yes  Engaged with patient by telephone for initial visit for Heart and Vascular Care Coordination.                                                                                                   Assessment:              LCSW was able to reach pt this morning at [phone], introduced self, role, reason for call. Pt confirmed home address, current PCP and is aware that as of 08/10/22 she now has Managed Medicaid. I shared that I have sent her a copy of her benefits and discussed how to contact the Medicaid program and change assigned PCP or to get further benefit information. Pt states otherwise doing okay- states no cost of living challenges to this writer at this time. I have mailed her also a [employer]  application started for previously unpaid medical bills. LCSW remains available.                          HRT/VAS Care Coordination     Patients Home Cardiology Office --  [facility]   Outpatient Care Team Social Worker   Social Worker Name: Westley Hummer, [place], [place]   Living arrangements for the past 2 months Apartment   Lives with: Self   Patient Current Insurance Coverage Medicaid   Patient Has Concern With Paying Medical Bills No   Does Patient Have Prescription Coverage? Yes   Home Assistive Devices/Equipment Contact lenses       Social History:                                                                             SDOH Screenings   Food Insecurity: No Food Insecurity (08/22/2022)  Housing: Low Risk  (08/22/2022)  Transportation Needs: No Transportation Needs (08/22/2022)  Utilities: Not At Risk (08/22/2022)  Depression (PHQ2-9): Medium Risk (07/30/2022)  Financial Resource Strain: Low Risk  (08/22/2022)  Recent Concern: Financial Resource Strain -  Medium Risk (07/30/2022)  Physical Activity: Sufficiently Active (10/24/2017)  Social Connections: Moderately Integrated (07/30/2022)  Stress: Stress Concern Present (10/24/2017)  Tobacco Use: Medium Risk (08/21/2022)    SDOH Interventions: Financial Resources:  Financial Strain Interventions: Intervention Not Indicated  Food Insecurity:  Food Insecurity Interventions: Intervention Not Indicated  Housing Insecurity:  Housing Interventions: Intervention Not Indicated  Transportation:   Transportation Interventions: Intervention Not Indicated    Other Care Navigation Interventions:     Provided Pharmacy assistance resources  Now has Medicaid benefits   Follow-up plan:   LCSW mailed pt the following: my card, Sport and exercise psychologist and  Medicaid information. No additional questions/concerns from pt at this time, will f/u again as able.

## 2022-08-28 ENCOUNTER — Emergency Department (HOSPITAL_COMMUNITY): Payer: Medicaid Other

## 2022-08-28 ENCOUNTER — Encounter (HOSPITAL_COMMUNITY): Payer: Self-pay

## 2022-08-28 ENCOUNTER — Other Ambulatory Visit: Payer: Self-pay

## 2022-08-28 ENCOUNTER — Emergency Department (HOSPITAL_COMMUNITY)
Admission: EM | Admit: 2022-08-28 | Discharge: 2022-08-29 | Disposition: A | Discharge status: Home / Self Care | Payer: Medicaid Other | Attending: Emergency Medicine | Admitting: Emergency Medicine

## 2022-08-28 DIAGNOSIS — Z7984 Long term (current) use of oral hypoglycemic drugs: Secondary | ICD-10-CM | POA: Insufficient documentation

## 2022-08-28 DIAGNOSIS — E119 Type 2 diabetes mellitus without complications: Secondary | ICD-10-CM | POA: Diagnosis not present

## 2022-08-28 DIAGNOSIS — D649 Anemia, unspecified: Secondary | ICD-10-CM | POA: Insufficient documentation

## 2022-08-28 DIAGNOSIS — F17200 Nicotine dependence, unspecified, uncomplicated: Secondary | ICD-10-CM | POA: Insufficient documentation

## 2022-08-28 DIAGNOSIS — Z7982 Long term (current) use of aspirin: Secondary | ICD-10-CM | POA: Diagnosis not present

## 2022-08-28 DIAGNOSIS — M546 Pain in thoracic spine: Secondary | ICD-10-CM | POA: Insufficient documentation

## 2022-08-28 DIAGNOSIS — M542 Cervicalgia: Secondary | ICD-10-CM | POA: Insufficient documentation

## 2022-08-28 DIAGNOSIS — R519 Headache, unspecified: Secondary | ICD-10-CM | POA: Insufficient documentation

## 2022-08-28 DIAGNOSIS — J45909 Unspecified asthma, uncomplicated: Secondary | ICD-10-CM | POA: Diagnosis not present

## 2022-08-28 DIAGNOSIS — I10 Essential (primary) hypertension: Secondary | ICD-10-CM | POA: Insufficient documentation

## 2022-08-28 MED ORDER — ONDANSETRON 4 MG PO TBDP
4.0000 mg | ORAL_TABLET | Freq: Once | ORAL | Status: AC
  Administered 2022-08-28: 4 mg via ORAL
  Filled 2022-08-28: qty 1

## 2022-08-28 MED ORDER — SODIUM CHLORIDE 0.9% FLUSH
3.0000 mL | Freq: Once | INTRAVENOUS | Status: AC
  Administered 2022-08-28: 3 mL via INTRAVENOUS

## 2022-08-28 NOTE — ED Notes (Signed)
Samantha [name] at bedside evaluating for code stroke.

## 2022-08-28 NOTE — ED Provider Triage Note (Signed)
Emergency Medicine Provider Triage Evaluation Note  Samantha [name] , a 46 y.o. female  was evaluated in triage.  Patient presents complaining of a headache.  She reports it was acute onset, right-sided and woke her up out of her sleep.  Has a history of migraines.  Endorses some vague blurred vision.  Also complaining of nausea and vomiting.  She is not on the blood thinner  Review of Systems  Positive:  Negative:   Physical Exam  BP (!) 131/96 (BP Location: Left Arm)   Pulse 80   Temp 98.2 F (36.8 C)   Resp 17   Ht '5\' 4"'$  (1.626 m)   Wt 86.6 kg   LMP 08/28/2022 (Approximate)   SpO2 96%   BMI 32.79 kg/m  Gen:   Awake, no distress   Resp:  Normal effort  MSK:   Moves extremities without difficulty  Other:  Cranial nerves II through XII grossly intact.  Moving all extremities.  4-5 strength in bilateral upper and lower extremities, I suspect more secondary to overall discomfort.  PERRLA, EOMs intact and no visual field deficit.  No facial droop or aphasia  Medical Decision Making  Medically screening exam initiated at 11:28 PM.  Appropriate orders placed.  Jaysie Neya Creegan was informed that the remainder of the evaluation will be completed by another provider, this initial triage assessment does not replace that evaluation, and the importance of remaining in the ED until their evaluation is complete.    Patient has a nonfocal physical exam.  Low suspicion CVA/TIA.  Spoke about this patient with Dr. Christy Gentles.  Code medical will be placed for her head CT however no code stroke.   Rhae Hammock, PA-C 08/28/22 2330

## 2022-08-28 NOTE — ED Triage Notes (Signed)
Pt reports she went to bed tonight around 9pm and woke up around 1030pm with a headache and numbness to her right arm, hand and leg as well as tingling to the right side of her lips. She reports feeling nauseous like she needs to vomit but she is unable to.

## 2022-08-28 NOTE — ED Notes (Signed)
Patient transported to CT 

## 2022-08-28 NOTE — ED Notes (Signed)
No code stroke at this time.

## 2022-08-29 LAB — CBC
HCT: 36.8 % (ref 36.0–46.0)
Hemoglobin: 11.7 g/dL — ABNORMAL LOW (ref 12.0–15.0)
MCH: 28.3 pg (ref 26.0–34.0)
MCHC: 31.8 g/dL (ref 30.0–36.0)
MCV: 88.9 fL (ref 80.0–100.0)
Platelets: 346 10*3/uL (ref 150–400)
RBC: 4.14 MIL/uL (ref 3.87–5.11)
RDW: 13.2 % (ref 11.5–15.5)
WBC: 7.3 10*3/uL (ref 4.0–10.5)
nRBC: 0 % (ref 0.0–0.2)

## 2022-08-29 LAB — DIFFERENTIAL
Abs Immature Granulocytes: 0.01 10*3/uL (ref 0.00–0.07)
Basophils Absolute: 0 10*3/uL (ref 0.0–0.1)
Basophils Relative: 1 %
Eosinophils Absolute: 0.2 10*3/uL (ref 0.0–0.5)
Eosinophils Relative: 3 %
Immature Granulocytes: 0 %
Lymphocytes Relative: 47 %
Lymphs Abs: 3.5 10*3/uL (ref 0.7–4.0)
Monocytes Absolute: 0.5 10*3/uL (ref 0.1–1.0)
Monocytes Relative: 6 %
Neutro Abs: 3.1 10*3/uL (ref 1.7–7.7)
Neutrophils Relative %: 43 %

## 2022-08-29 LAB — COMPREHENSIVE METABOLIC PANEL
ALT: 8 U/L (ref 0–44)
AST: 20 U/L (ref 15–41)
Albumin: 3.6 g/dL (ref 3.5–5.0)
Alkaline Phosphatase: 74 U/L (ref 38–126)
Anion gap: 10 (ref 5–15)
BUN: 13 mg/dL (ref 6–20)
CO2: 24 mmol/L (ref 22–32)
Calcium: 9.2 mg/dL (ref 8.9–10.3)
Chloride: 103 mmol/L (ref 98–111)
Creatinine, Ser: 0.89 mg/dL (ref 0.44–1.00)
GFR, Estimated: >60 mL/min (ref >60)
Glucose, Bld: 125 mg/dL — ABNORMAL HIGH (ref 70–99)
Potassium: 3.4 mmol/L — ABNORMAL LOW (ref 3.5–5.1)
Sodium: 137 mmol/L (ref 135–145)
Total Bilirubin: 0.2 mg/dL — ABNORMAL LOW (ref 0.3–1.2)
Total Protein: 7.5 g/dL (ref 6.5–8.1)

## 2022-08-29 LAB — I-STAT CHEM 8, ED
BUN: 15 mg/dL (ref 6–20)
Calcium, Ion: 0.82 mmol/L — CL (ref 1.15–1.40)
Chloride: 107 mmol/L (ref 98–111)
Creatinine, Ser: 0.7 mg/dL (ref 0.44–1.00)
Glucose, Bld: 128 mg/dL — ABNORMAL HIGH (ref 70–99)
HCT: 38 % (ref 36.0–46.0)
Hemoglobin: 12.9 g/dL (ref 12.0–15.0)
Potassium: 3.8 mmol/L (ref 3.5–5.1)
Sodium: 135 mmol/L (ref 135–145)
TCO2: 21 mmol/L — ABNORMAL LOW (ref 22–32)

## 2022-08-29 LAB — APTT: aPTT: 28 seconds (ref 24–36)

## 2022-08-29 LAB — PROTIME-INR
INR: 1 (ref 0.8–1.2)
Prothrombin Time: 12.9 seconds (ref 11.4–15.2)

## 2022-08-29 LAB — I-STAT BETA HCG BLOOD, ED (MC, WL, AP ONLY): I-stat hCG, quantitative: <5 m[IU]/mL (ref <5)

## 2022-08-29 LAB — ETHANOL: Alcohol, Ethyl (B): <10 mg/dL (ref <10)

## 2022-08-29 MED ORDER — PROCHLORPERAZINE EDISYLATE 10 MG/2ML IJ SOLN
10.0000 mg | Freq: Once | INTRAMUSCULAR | Status: AC
  Administered 2022-08-29: 10 mg via INTRAVENOUS
  Filled 2022-08-29: qty 2

## 2022-08-29 MED ORDER — KETOROLAC TROMETHAMINE 15 MG/ML IJ SOLN
15.0000 mg | Freq: Once | INTRAMUSCULAR | Status: AC
  Administered 2022-08-29: 15 mg via INTRAVENOUS
  Filled 2022-08-29: qty 1

## 2022-08-29 MED ORDER — DIAZEPAM 5 MG PO TABS: 2.5000 mg | ORAL_TABLET | Freq: Every evening | ORAL | 0 refills | Status: AC | PRN

## 2022-08-29 MED ORDER — METOCLOPRAMIDE HCL 5 MG/ML IJ SOLN
10.0000 mg | Freq: Once | INTRAMUSCULAR | Status: AC
  Administered 2022-08-29: 10 mg via INTRAVENOUS
  Filled 2022-08-29: qty 2

## 2022-08-29 NOTE — ED Provider Notes (Signed)
[facility] EMERGENCY DEPARTMENT Provider Note  CSN: [id number] Arrival date & time: 08/28/22 2310  Chief Complaint(s) Headache  HPI Samantha [name] is a 46 y.o. female with a past medical history listed below including hypertension, hyperlipidemia, diabetes, chronic migraine headaches and prior strokes here for recurrent headache.  Patient reports that she has been having the headache almost daily every morning.  This evening, when she awoke at 9 PM the headache had returned.  She was having right-sided numbness and tingling.  No focal deficits or weakness.  This is similar to her prior migraine headaches.  States that she is not currently taking any headache medicine other than Tylenol and ibuprofen which did not work Midwife.  Headache is worse with movement of the neck and shoulders.  No recent fevers or infections.  No other physical complaints.  The history is provided by the patient.    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[place]) 07/13/2022   Migraine headache with aura 07/11/2022   Amenorrhea 09/17/2017   Adjustment disorder with mixed anxiety and depressed mood 06/26/2016   Depressed mood 06/26/2016   Back pain 01/31/2016   Caffeine abuse, 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 Authorizing Provider  diazepam (VALIUM) 5 MG tablet Take 0.5-1 tablets (2.5-5 mg total) by mouth at bedtime as needed for anxiety. 08/29/22  Yes Juliah Scadden, Grayce Sessions, MD  albuterol (VENTOLIN HFA) 108 (90 Base) MCG/ACT inhaler Inhale 2 puffs into the lungs every 4 (four) hours as needed for wheezing or shortness of breath. 06/12/22   Rondel Oh, MD  aspirin EC 81 MG tablet Take 1 tablet (81 mg total) by mouth daily. Swallow whole. 07/13/22   Linward Natal, MD  atorvastatin (LIPITOR) 40 MG tablet Take 1 tablet (40 mg total) by mouth daily. 07/13/22   Linward Natal, MD  benzonatate (TESSALON) 100 MG capsule Take 1 capsule (100 mg total) by mouth every 8 (eight) hours. Patient not taking: Reported on 08/21/2022 06/12/22   Rondel Oh, MD  gabapentin (NEURONTIN) 100 MG capsule Take 1 capsule (100 mg total) by mouth 3 (three) times daily. Patient not taking: Reported on 08/21/2022 03/24/21   Camillia Herter, NP  gabapentin (NEURONTIN) 300 MG capsule Take 1 capsule (300 mg total) by mouth 3 (three) times daily as needed. Patient not taking: Reported on 08/21/2022 08/10/19   Antony Blackbird, MD  metFORMIN (GLUCOPHAGE)  500 MG tablet Take 1 tablet (500 mg total) by mouth 2 (two) times daily with a meal. 08/10/19   Fulp, Cammie, MD  topiramate (TOPAMAX) 25 MG tablet Take 1 tablet (25 mg total) by mouth 2 (two) times daily. 07/24/22 08/23/22  Delene Ruffini, MD                                                                                                                                     Allergies Patient has no known allergies.  Review of Systems Review of Systems As noted in HPI  Physical Exam Vital Signs  I have reviewed the triage vital signs BP 105/73   Pulse 71   Temp 98.2 F (36.8 C) (Oral)   Resp 13   Ht '5\' 4"'$  (1.626 m)   Wt 86.6 kg   LMP 08/28/2022 (Approximate)   SpO2 100%   BMI 32.79 kg/m   Physical Exam Vitals reviewed.  Constitutional:      General: She is not in acute distress.    Appearance: She is well-developed. She is not diaphoretic.  HENT:     Head: Normocephalic and atraumatic.     Nose: Nose normal.  Eyes:     General: No scleral icterus.       Right eye: No discharge.        Left eye: No discharge.     Conjunctiva/sclera: Conjunctivae normal.     Pupils: Pupils are equal, round, and reactive to light.  Cardiovascular:     Rate and Rhythm: Normal rate and regular rhythm.     Heart sounds: No murmur heard.    No friction rub. No gallop.  Pulmonary:     Effort: Pulmonary effort is normal. No respiratory distress.     Breath sounds: Normal breath sounds. No stridor. No rales.  Abdominal:     General: There is no distension.     Palpations: Abdomen is soft.     Tenderness: There is no abdominal tenderness.  Musculoskeletal:     Cervical back: Normal range of motion and neck supple. Tenderness present. No rigidity or bony tenderness. Normal range of motion.     Thoracic back: Tenderness present.       Back:  Skin:    General: Skin is warm and dry.     Findings: No erythema or rash.  Neurological:     Mental Status: She is alert and oriented to person, place, and time.     Comments: Mental Status:  Alert and oriented to person, place, and time.  Attention and concentration normal.  Speech clear.  Recent memory is intact  Cranial Nerves:  II Visual Fields: Intact to confrontation. Visual fields intact. III, IV, VI: Pupils equal and reactive to light and near. Full eye movement without nystagmus  V Facial Sensation:  Normal. No weakness of masticatory muscles  VII: No facial weakness or asymmetry  VIII Auditory Acuity: Grossly  normal  IX/X: The uvula is midline; the palate elevates symmetrically  XI: Normal sternocleidomastoid and trapezius strength  XII: The tongue is midline. No atrophy or fasciculations.   Motor System: Muscle Strength: 5/5 and symmetric in the upper and lower extremities. No pronation or drift.  Muscle Tone: Tone and muscle bulk are normal in the upper and lower extremities.  Coordination:  No tremor.  Sensation: Intact to light touch, Gait: Routine gait normal.      ED Results and Treatments Labs (all labs ordered are listed, but only abnormal results are displayed) Labs Reviewed  CBC - Abnormal; Notable for the following components:      Result Value   Hemoglobin 11.7 (*)    All other components within normal limits  COMPREHENSIVE METABOLIC PANEL - Abnormal; Notable for the following components:   Potassium 3.4 (*)    Glucose, Bld 125 (*)    Total Bilirubin 0.2 (*)    All other components within normal limits  I-STAT CHEM 8, ED - Abnormal; Notable for the following components:   Glucose, Bld 128 (*)    Calcium, Ion 0.82 (*)    TCO2 21 (*)    All other components within normal limits  PROTIME-INR  APTT  DIFFERENTIAL  ETHANOL  CBG MONITORING, ED  I-STAT BETA HCG BLOOD, ED ([facility], [facility], [facility] ONLY)                                                                                                                         EKG  EKG Interpretation  Date/Time:  Tuesday August 28 2022 23:21:15 EST Ventricular Rate:  75 PR Interval:  132 QRS Duration: 68 QT Interval:  400 QTC Calculation: 446 R Axis:   4 Text Interpretation: Normal sinus rhythm Low voltage QRS Borderline ECG When compared with ECG of 11-Jul-2022 07:59, PREVIOUS ECG IS PRESENT Confirmed by Addison Lank [phone]) on 08/29/2022 1:38:14 AM       Radiology CT HEAD WO CONTRAST  Result Date:  08/28/2022 CLINICAL DATA:  Right arm and leg numbness EXAM: CT HEAD WITHOUT CONTRAST TECHNIQUE: Contiguous axial images were obtained from the base of the skull through the vertex without intravenous contrast. RADIATION DOSE REDUCTION: This exam was performed according to the departmental dose-optimization program which includes automated exposure control, adjustment of the mA and/or kV according to patient size and/or use of iterative reconstruction technique. COMPARISON:  CT 07/11/2022, MRI 07/11/2022 FINDINGS: Brain: No acute territorial infarction, hemorrhage or intracranial mass. The ventricles are nonenlarged. Vascular: No hyperdense vessels.  No unexpected calcification Skull: Normal. Negative for fracture or focal lesion. Sinuses/Orbits: Mucous retention cyst left maxillary sinus Other: None IMPRESSION: Negative non contrasted CT appearance of the brain. Electronically Signed   By: Donavan Foil M.D.   On: 08/28/2022 23:50    Medications Ordered in ED Medications  sodium chloride flush (NS) 0.9 % injection 3 mL (3 mLs Intravenous Given 08/28/22 2351)  ondansetron (ZOFRAN-ODT) disintegrating tablet 4 mg (4 mg Oral  Given 08/28/22 2330)  ketorolac (TORADOL) 15 MG/ML injection 15 mg (15 mg Intravenous Given 08/29/22 0020)  metoCLOPramide (REGLAN) injection 10 mg (10 mg Intravenous Given 08/29/22 0020)  prochlorperazine (COMPAZINE) injection 10 mg (10 mg Intravenous Given 08/29/22 0206)                                                                                                                                     Procedures Procedures  (including critical care time)  Medical Decision Making / ED Course   Medical Decision Making Amount and/or Complexity of Data Reviewed External Data Reviewed: radiology.    Details: Prior MRI confirming stroke Labs: ordered. Decision-making details documented in ED Course. Radiology: ordered and independent interpretation performed. Decision-making  details documented in ED Course. ECG/medicine tests: ordered and independent interpretation performed. Decision-making details documented in ED Course.  Risk Prescription drug management. Parenteral controlled substances. Decision regarding hospitalization.    Patient presents with typical headache with paresthesias on the right side.  No other focal deficits on exam. Given her comorbidities, labs and imaging were obtained to rule out any electrolyte or metabolic derangements.  Presentation is not concerning for meningitis.  CBC without leukocytosis.  Mild anemia with stable hemoglobin.  Metabolic panel without significant electrolyte derangements or renal sufficiency. CT head negative for ICH or any evidence of acute or subacute stroke.  Patient initially treated with migraine cocktail which provided mild relief.  Headache went from 10 out of 10 to 9 out of 10.  On reassessment, right thenar pressure-point pressure was applied which provided most of the relief of her headache.  After this maneuver, patient's headache went from 9 out of 10 to a 5 out of 10.  She was given additional dose of Compazine for more relief.  She reported the rest of her paresthesias have completely resolved.  Patient was provided with a prescription of Valium as a muscle relaxer and given instructions for treatment of muscle strain/spasms.  Recommended she follow-up with PCP and neurologist for continued migraine headache/tension headache management.      Final Clinical Impression(s) / ED Diagnoses Final diagnoses:  Bad headache   The patient appears reasonably screened and/or stabilized for discharge and I doubt any other medical condition or other [facility] requiring further screening, evaluation, or treatment in the ED at this time. I have discussed the findings, Dx and Tx plan with the patient/family who expressed understanding and agree(s) with the plan. Discharge instructions discussed at length. The  patient/family was given strict return precautions who verbalized understanding of the instructions. No further questions at time of discharge.  Disposition: Discharge  Condition: Good  ED Discharge Orders          Ordered    diazepam (VALIUM) 5 MG tablet  At bedtime PRN        08/29/22 0234            [facility] narcotic database  reviewed and no active prescriptions noted.   Follow Up: Program, [facility] [place] [id number] [phone]  Call  to schedule an appointment for close follow up  [facility] [address]     [address] [place] [phone] Call  to schedule an appointment for close follow up           This chart was dictated using voice recognition software.  Despite best efforts to proofread,  errors can occur which can change the documentation meaning.    Fatima Blank, MD 08/29/22 [phone]

## 2022-08-29 NOTE — Discharge Instructions (Addendum)
You may use over-the-counter Motrin (Ibuprofen), Acetaminophen (Tylenol), topical muscle creams such as SalonPas, Icy Hot, Bengay, etc. Please stretch, apply ice or heat (whichever helps), and have massage therapy for additional assistance.  

## 2022-09-12 ENCOUNTER — Other Ambulatory Visit (HOSPITAL_COMMUNITY): Payer: Self-pay

## 2022-09-12 ENCOUNTER — Other Ambulatory Visit: Payer: Self-pay

## 2022-09-13 ENCOUNTER — Other Ambulatory Visit: Payer: Self-pay

## 2022-09-21 ENCOUNTER — Other Ambulatory Visit: Payer: Self-pay

## 2022-10-19 ENCOUNTER — Encounter: Payer: Self-pay | Admitting: Internal Medicine

## 2022-10-19 ENCOUNTER — Ambulatory Visit (INDEPENDENT_AMBULATORY_CARE_PROVIDER_SITE_OTHER): Payer: Medicaid Other | Admitting: Internal Medicine

## 2022-10-19 ENCOUNTER — Other Ambulatory Visit: Payer: Self-pay

## 2022-10-19 VITALS — BP 109/70 | HR 82 | Temp 98.1°F | Ht 64.0 in | Wt 193.7 lb

## 2022-10-19 DIAGNOSIS — G459 Transient cerebral ischemic attack, unspecified: Secondary | ICD-10-CM

## 2022-10-19 DIAGNOSIS — G43109 Migraine with aura, not intractable, without status migrainosus: Secondary | ICD-10-CM | POA: Diagnosis present

## 2022-10-19 DIAGNOSIS — R7303 Prediabetes: Secondary | ICD-10-CM

## 2022-10-19 MED ORDER — TOPIRAMATE 25 MG PO TABS
25.0000 mg | ORAL_TABLET | Freq: Two times a day (BID) | ORAL | 0 refills | Status: DC
  Filled 2022-10-19: qty 60, 30d supply, fill #0

## 2022-10-19 MED ORDER — ATORVASTATIN CALCIUM 40 MG PO TABS
40.0000 mg | ORAL_TABLET | Freq: Every day | ORAL | 2 refills | Status: DC
  Filled 2022-10-19: qty 30, 30d supply, fill #0
  Filled 2022-12-14: qty 30, 30d supply, fill #1
  Filled 2023-01-10: qty 30, 30d supply, fill #2

## 2022-10-19 MED ORDER — METFORMIN HCL 500 MG PO TABS
500.0000 mg | ORAL_TABLET | Freq: Two times a day (BID) | ORAL | 3 refills | Status: AC
  Filled 2022-10-19: qty 180, 90d supply, fill #0

## 2022-10-19 NOTE — Patient Instructions (Signed)
Dear Mrs. Samantha [name],  Thank you for trusting [place] with your care.   We discussed your headaches.  We would like for you to see the neurologist.  We will refer you to get a sleep study done. Please try the Topimax to prevent headaches. We will see you back in 3 months.

## 2022-10-19 NOTE — Progress Notes (Unsigned)
   CC: HA  HPI:[name] is a 47 y.o. female who presents for evaluation of HA. Please see individual problem based A/P for details.  29 yof with hx CVA 2/2 migrainevssmall vesselvs cardioembolic, complicated migraine with aura, tobacco use, HLD,  Has been taking asa every day, has been using tylenol and advil, will have headaches 3-4 days per week.    Depression, PHQ-9: Based on the patients  [facility] Visit from 07/24/2022 in [place]  PHQ-9 Total Score 10      score we have .  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   Depression    Phreesia 08/25/2020   Diabetes mellitus without complication ([place])    Phreesia 08/25/2020   Hydradenitis    Hyperlipidemia    TIA (transient ischemic attack) 04/2015   questionable, most likely complicated migraine   Review of Systems:   See hpi  Physical Exam: Vitals:   10/19/22 1002  BP: 109/70  Pulse: 82  Temp: 98.1 F (36.7 C)  TempSrc: Oral  SpO2: 100%  Weight: 193 lb 11.2 oz (87.9 kg)  Height: '5\' 4"'$  (1.626 m)   General: nad HEENT: Conjunctiva nl , antiicteric sclerae, moist mucous membranes, no exudate or erythema Cardiovascular: Normal rate, regular rhythm.  No murmurs, rubs, or gallops Pulmonary : Equal breath sounds, No wheezes, rales, or rhonchi Abdominal: soft, nontender,  bowel sounds present Ext: No edema in lower extremities, no tenderness to palpation of lower extremities.   Assessment & Plan:   See Encounters Tab for problem based charting.  Patient discussed with Dr.  Cain Sieve

## 2022-10-21 ENCOUNTER — Encounter: Payer: Self-pay | Admitting: Internal Medicine

## 2022-10-21 NOTE — Assessment & Plan Note (Signed)
Has been having headaches 3-4 days per week. They are bilateral, "thumping". Endorses some flashing lights. HA may start unilaterally and will spread diffusely. May have tearing and rhinorrhea. Will have nausea. Tingling in right hand that will extend up arm each time. These are the same symptoms as when sshe had her CVA with the the exception of confusion at that time. She was supposed to follow up with [facility] but did not due to lack of insurance. She now has medicaide. She was prescribed topimax but did not start this.  Boyfriend notes she snores very loudly and will stop breathing in her sleep.  No focal findings on exam.  We will trial topiramate for her. Refer back to [facility] for follow up nw that she has converage. She is intermediate risk for OSA and if untreated, this could be contributing to her HA. We will also obtain a sleep study.   We will

## 2022-10-22 NOTE — Progress Notes (Signed)
Internal Medicine Clinic Attending ° °Case discussed with Dr. Gawaluck  At the time of the visit.  We reviewed the resident’s history and exam and pertinent patient test results.  I agree with the assessment, diagnosis, and plan of care documented in the resident’s note.  °

## 2022-12-14 ENCOUNTER — Other Ambulatory Visit: Payer: Self-pay

## 2022-12-14 ENCOUNTER — Other Ambulatory Visit: Payer: Self-pay | Admitting: Internal Medicine

## 2022-12-14 MED ORDER — TOPIRAMATE 25 MG PO TABS
25.0000 mg | ORAL_TABLET | Freq: Two times a day (BID) | ORAL | 0 refills | Status: DC
  Filled 2022-12-14 – 2022-12-28 (×2): qty 60, 30d supply, fill #0

## 2022-12-21 ENCOUNTER — Other Ambulatory Visit: Payer: Self-pay

## 2022-12-28 ENCOUNTER — Other Ambulatory Visit: Payer: Self-pay

## 2023-01-14 ENCOUNTER — Other Ambulatory Visit: Payer: Self-pay

## 2023-02-05 ENCOUNTER — Encounter (HOSPITAL_COMMUNITY): Payer: Self-pay

## 2023-02-05 ENCOUNTER — Ambulatory Visit (HOSPITAL_COMMUNITY)
Admission: EM | Admit: 2023-02-05 | Discharge: 2023-02-05 | Disposition: A | Discharge status: Home / Self Care | Payer: Medicaid Other | Attending: Family Medicine | Admitting: Family Medicine

## 2023-02-05 ENCOUNTER — Other Ambulatory Visit (HOSPITAL_COMMUNITY): Payer: Self-pay

## 2023-02-05 ENCOUNTER — Other Ambulatory Visit: Payer: Self-pay

## 2023-02-05 DIAGNOSIS — H5712 Ocular pain, left eye: Secondary | ICD-10-CM | POA: Diagnosis not present

## 2023-02-05 MED ORDER — GENTAMICIN SULFATE 0.3 % OP SOLN
2.0000 [drp] | Freq: Three times a day (TID) | OPHTHALMIC | 0 refills | Status: AC
  Filled 2023-02-05: qty 5, 5d supply, fill #0
  Filled 2023-02-05: qty 5, 17d supply, fill #0

## 2023-02-05 MED ORDER — TETRACAINE HCL 0.5 % OP SOLN
OPHTHALMIC | Status: AC
  Filled 2023-02-05: qty 4

## 2023-02-05 MED ORDER — FLUORESCEIN SODIUM 1 MG OP STRP
ORAL_STRIP | OPHTHALMIC | Status: AC
  Filled 2023-02-05: qty 1

## 2023-02-05 MED ORDER — EYE WASH OP SOLN
OPHTHALMIC | Status: AC
  Filled 2023-02-05: qty 118

## 2023-02-05 NOTE — ED Triage Notes (Addendum)
Patient was feeding her dogs outside and mud flew into her left eye. The left eye is now irritated, red, and feels like something is in it. Has tried flushing the eye and used eye drops with no relief.   Onset 1 month ago.

## 2023-02-05 NOTE — ED Provider Notes (Signed)
[facility]    CSN: [id number] Arrival date & time: 02/05/23  1206      History   Chief Complaint Chief Complaint  Patient presents with   Eye Pain    Left    HPI Samantha [name] is a 47 y.o. female.    Eye Pain   Here for irritation and foreign body sensation in her left eye.  About 1 month ago she was feeding her dog and had mud splashed into her left eye.  She was able to rinse it out and did not have any pain or irritation or redness immediately after.  Then 2 weeks later, or 2 weeks ago, she began having irritation and feeling like there is something stuck in her eye.  She is also had some redness and tearing.  No discharge  No allergies to medicines  Last menstrual cycle was May 14  He does wear contacts.  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   Migraine headache with aura 07/11/2022   Amenorrhea 09/17/2017   Adjustment disorder with mixed anxiety and depressed mood 06/26/2016   Depressed mood 06/26/2016   Back pain 01/31/2016   Caffeine abuse, 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   Acute pericarditis 12/26/2015   Hidradenitis suppurativa 11/07/2015   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   Sinusitis 10/20/2015   Complicated migraine 10/20/2015   Marijuana use 10/20/2015   Tobacco use 05/04/2015   TIA (transient ischemic attack) 05/04/2015   Palpitations 05/04/2015   Decreased appetite  05/04/2015   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HCC) 04/22/2015   Hyperlipidemia 04/22/2015   TOA (tubo-ovarian abscess) 11/23/2012   History of abnormal Pap smear 12/20/2011   History of PID 12/20/2011    Past Surgical History:  Procedure Laterality Date   CERVICAL BIOPSY  W/ LOOP ELECTRODE EXCISION     ESOPHAGOGASTRODUODENOSCOPY (EGD) WITH PROPOFOL N/A 10/14/2018   Procedure: ESOPHAGOGASTRODUODENOSCOPY (EGD) WITH PROPOFOL;  Surgeon: Charlott Rakes, MD;  Location: [facility];  Service: Endoscopy;  Laterality: N/A;   LEEP      OB History     Gravida  2   Para  1   Term  1   Preterm      AB  1   Living  1      SAB  1   IAB      Ectopic      Multiple      Live Births               Home Medications    Prior to Admission medications   Medication Sig Start Date End Date Taking? Authorizing Provider  albuterol (VENTOLIN HFA) 108 (90 Base) MCG/ACT inhaler Inhale 2 puffs into the lungs every 4 (four) hours as needed for wheezing or shortness of breath. 06/12/22  Yes Piontek, Denny Peon, MD  aspirin EC 81 MG tablet Take 1 tablet (81 mg total) by mouth daily. Swallow whole.  07/13/22  Yes Adron Bene, MD  atorvastatin (LIPITOR) 40 MG tablet Take 1 tablet (40 mg total) by mouth daily. 10/19/22  Yes Adron Bene, MD  gentamicin (GARAMYCIN) 0.3 % ophthalmic solution Place 2 drops into the left eye 3 (three) times daily for 5 days. 02/05/23 02/10/23 Yes Zenia Resides, MD  diazepam (VALIUM) 5 MG tablet Take 0.5-1 tablets (2.5-5 mg total) by mouth at bedtime as needed for anxiety. 08/29/22   Nira Conn, MD  metFORMIN (GLUCOPHAGE) 500 MG tablet Take 1 tablet (500 mg total) by mouth 2 (two) times daily with a meal. 10/19/22   Adron Bene, MD    Family History Family History  Problem Relation Age of Onset   Hypertension Mother    Lupus Mother    Diabetes Father    Thyroid disease Sister    Diabetes Sister    Cancer Sister        stomach   Diabetes Maternal  Grandmother    Anesthesia problems Neg Hx    Other Neg Hx    Migraines Neg Hx     Social History Social History   Tobacco Use   Smoking status: Former    Packs/day: 1.00    Years: 17.00    Additional pack years: 0.00    Total pack years: 17.00    Types: Cigars, Cigarettes   Smokeless tobacco: Never   Tobacco comments:    quit 02/08/2020  Vaping Use   Vaping Use: Some days  Substance Use Topics   Alcohol use: Yes    Alcohol/week: 1.0 standard drink of alcohol    Types: 1 Shots of liquor per week    Comment: drink on the weekends   Drug use: Not Currently    Frequency: 3.0 times per week    Types: Marijuana     Allergies   Patient has no known allergies.   Review of Systems Review of Systems  Eyes:  Positive for pain.     Physical Exam Triage Vital Signs ED Triage Vitals  Enc Vitals Group     BP 02/05/23 1227 109/78     Pulse Rate 02/05/23 1227 74     Resp 02/05/23 1227 18     Temp 02/05/23 1227 98.5 F (36.9 C)     Temp Source 02/05/23 1227 Oral     SpO2 02/05/23 1227 97 %     Weight --      Height --      Head Circumference --      Peak Flow --      Pain Score 02/05/23 1226 10     Pain Loc --      Pain Edu? --      Excl. in GC? --    No data found.  Updated Vital Signs BP 109/78 (BP Location: Left Arm)   Pulse 74   Temp 98.5 F (36.9 C) (Oral)   Resp 18   LMP 01/22/2023 (Approximate)   SpO2 97%   Visual Acuity Right Eye Distance:   Left Eye Distance:   Bilateral Distance:    Right Eye Near:   Left Eye Near:    Bilateral Near:     Physical Exam Vitals reviewed.  Constitutional:      General: She is not in acute distress.    Appearance: She is not ill-appearing, toxic-appearing or diaphoretic.  HENT:     Mouth/Throat:     Mouth: Mucous membranes are moist.  Eyes:     Extraocular Movements: Extraocular  movements intact.     Conjunctiva/sclera: Conjunctivae normal.     Pupils: Pupils are equal, round, and reactive to light.      Comments: I cannot discern any foreign body.  There is no injection of either eye.  The lids are nonswollen  Cardiovascular:     Rate and Rhythm: Normal rate and regular rhythm.  Pulmonary:     Breath sounds: Normal breath sounds.  Musculoskeletal:     Cervical back: Neck supple.  Lymphadenopathy:     Cervical: No cervical adenopathy.  Skin:    Coloration: Skin is not pale.  Neurological:     Mental Status: She is alert and oriented to person, place, and time.  Psychiatric:        Behavior: Behavior normal.      UC Treatments / Results  Labs (all labs ordered are listed, but only abnormal results are displayed) Labs Reviewed - No data to display  EKG   Radiology No results found.  Procedures Procedures (including critical care time)  Medications Ordered in UC Medications - No data to display  Initial Impression / Assessment and Plan / UC Course  I have reviewed the triage vital signs and the nursing notes.  Pertinent labs & imaging results that were available during my care of the patient were reviewed by me and considered in my medical decision making (see chart for details).       After consent given, tetracaine drops were applied to the left eye.  Then fluorescein staining was done and with the [place] lamp there was no abrasion or uptake.  Gentamicin eyedrops are sent in.  She is given contact information for ophthalmology.  She will call her primary care and her optometry office. Final Clinical Impressions(s) / UC Diagnoses   Final diagnoses:  Left eye pain     Discharge Instructions      Put gentamicin eyedrops in the affected eye(s) 3 times daily for 5 days.  I could not see any foreign body in your eye today.  This staining did not show any corneal abrasion or abnormal uptake.  Call both your primary care and your optometry office about this issue.       ED Prescriptions     Medication Sig Dispense Auth. Provider   gentamicin (GARAMYCIN)  0.3 % ophthalmic solution Place 2 drops into the left eye 3 (three) times daily for 5 days. 5 mL Zenia Resides, MD      PDMP not reviewed this encounter.   Zenia Resides, MD 02/05/23 1300

## 2023-02-05 NOTE — Discharge Instructions (Addendum)
Put gentamicin eyedrops in the affected eye(s) 3 times daily for 5 days.  I could not see any foreign body in your eye today.  This staining did not show any corneal abrasion or abnormal uptake.  Call both your primary care and your optometry office about this issue.

## 2023-02-13 ENCOUNTER — Encounter: Payer: Self-pay | Admitting: *Deleted

## 2023-03-12 ENCOUNTER — Other Ambulatory Visit: Payer: Self-pay | Admitting: Obstetrics and Gynecology

## 2023-03-12 DIAGNOSIS — Z1231 Encounter for screening mammogram for malignant neoplasm of breast: Secondary | ICD-10-CM

## 2023-03-13 ENCOUNTER — Ambulatory Visit
Admission: RE | Admit: 2023-03-13 | Discharge: 2023-03-13 | Disposition: A | Discharge status: Home / Self Care | Payer: Medicaid Other | Source: Ambulatory Visit | Attending: Obstetrics and Gynecology | Admitting: Obstetrics and Gynecology

## 2023-03-13 DIAGNOSIS — Z1231 Encounter for screening mammogram for malignant neoplasm of breast: Secondary | ICD-10-CM

## 2023-04-04 ENCOUNTER — Emergency Department (HOSPITAL_COMMUNITY)
Admission: EM | Admit: 2023-04-04 | Discharge: 2023-04-04 | Disposition: A | Discharge status: Home / Self Care | Payer: Medicaid Other | Source: Home / Self Care

## 2023-04-04 ENCOUNTER — Emergency Department (HOSPITAL_COMMUNITY): Payer: Medicaid Other

## 2023-04-04 ENCOUNTER — Other Ambulatory Visit: Payer: Self-pay

## 2023-04-04 ENCOUNTER — Encounter (HOSPITAL_COMMUNITY): Payer: Self-pay | Admitting: Emergency Medicine

## 2023-04-04 DIAGNOSIS — R531 Weakness: Secondary | ICD-10-CM | POA: Insufficient documentation

## 2023-04-04 DIAGNOSIS — Z5321 Procedure and treatment not carried out due to patient leaving prior to being seen by health care provider: Secondary | ICD-10-CM | POA: Diagnosis not present

## 2023-04-04 DIAGNOSIS — R079 Chest pain, unspecified: Secondary | ICD-10-CM | POA: Insufficient documentation

## 2023-04-04 DIAGNOSIS — R0602 Shortness of breath: Secondary | ICD-10-CM | POA: Insufficient documentation

## 2023-04-04 LAB — BASIC METABOLIC PANEL
Anion gap: 9 (ref 5–15)
BUN: 11 mg/dL (ref 6–20)
CO2: 22 mmol/L (ref 22–32)
Calcium: 9 mg/dL (ref 8.9–10.3)
Chloride: 103 mmol/L (ref 98–111)
Creatinine, Ser: 0.88 mg/dL (ref 0.44–1.00)
GFR, Estimated: >60 mL/min (ref >60)
Glucose, Bld: 110 mg/dL — ABNORMAL HIGH (ref 70–99)
Potassium: 3.7 mmol/L (ref 3.5–5.1)
Sodium: 134 mmol/L — ABNORMAL LOW (ref 135–145)

## 2023-04-04 LAB — CBC
HCT: 38.4 % (ref 36.0–46.0)
Hemoglobin: 11.8 g/dL — ABNORMAL LOW (ref 12.0–15.0)
MCH: 26.3 pg (ref 26.0–34.0)
MCHC: 30.7 g/dL (ref 30.0–36.0)
MCV: 85.5 fL (ref 80.0–100.0)
Platelets: 381 10*3/uL (ref 150–400)
RBC: 4.49 MIL/uL (ref 3.87–5.11)
RDW: 13.1 % (ref 11.5–15.5)
WBC: 5.7 10*3/uL (ref 4.0–10.5)
nRBC: 0 % (ref 0.0–0.2)

## 2023-04-04 LAB — TROPONIN I (HIGH SENSITIVITY)
Troponin I (High Sensitivity): 6 ng/L (ref <18)
Troponin I (High Sensitivity): 14 ng/L (ref <18)

## 2023-04-04 LAB — HCG, SERUM, QUALITATIVE: Preg, Serum: NEGATIVE

## 2023-04-04 NOTE — ED Notes (Signed)
NA for bed assignment 

## 2023-04-04 NOTE — ED Triage Notes (Signed)
Chest pain, SOB, weakness and sweating started approx 1 hour ago. Pt states pain is tight.

## 2023-04-22 ENCOUNTER — Encounter: Payer: Self-pay | Admitting: Student

## 2023-04-22 ENCOUNTER — Ambulatory Visit: Payer: Medicaid Other | Admitting: Student

## 2023-04-22 ENCOUNTER — Other Ambulatory Visit: Payer: Self-pay

## 2023-04-22 VITALS — BP 111/78 | HR 78 | Temp 98.3°F | Ht 64.0 in | Wt 192.1 lb

## 2023-04-22 DIAGNOSIS — Z87898 Personal history of other specified conditions: Secondary | ICD-10-CM | POA: Insufficient documentation

## 2023-04-22 DIAGNOSIS — L732 Hidradenitis suppurativa: Secondary | ICD-10-CM

## 2023-04-22 DIAGNOSIS — H5712 Ocular pain, left eye: Secondary | ICD-10-CM | POA: Insufficient documentation

## 2023-04-22 DIAGNOSIS — G4739 Other sleep apnea: Secondary | ICD-10-CM | POA: Diagnosis not present

## 2023-04-22 DIAGNOSIS — R0683 Snoring: Secondary | ICD-10-CM

## 2023-04-22 DIAGNOSIS — G44209 Tension-type headache, unspecified, not intractable: Secondary | ICD-10-CM | POA: Diagnosis not present

## 2023-04-22 DIAGNOSIS — R4589 Other symptoms and signs involving emotional state: Secondary | ICD-10-CM | POA: Diagnosis not present

## 2023-04-22 MED ORDER — CLINDAMYCIN PHOSPHATE 1 % EX GEL
Freq: Two times a day (BID) | CUTANEOUS | 0 refills | Status: DC
  Filled 2023-04-22: qty 30, 15d supply, fill #0

## 2023-04-22 MED ORDER — SERTRALINE HCL 50 MG PO TABS
50.0000 mg | ORAL_TABLET | Freq: Every day | ORAL | 1 refills | Status: AC
  Filled 2023-04-22: qty 30, 30d supply, fill #0

## 2023-04-22 NOTE — Assessment & Plan Note (Signed)
Patient was recently evaluated in emergency department for chest pain, shortness of breath, weakness, sweating that began while she was laying in bed.  She experienced chest pain like this about a week prior while she was also laying in bed.  In the emergency department, an EKG was negative for ischemic changes compared to prior, troponins x 2 were within normal limits, chest x-ray showed probable elective changes of the left lung base.  The pain spontaneously resolved in emergency department and she eloped due to starting her menstruation cycle.  Today, the patient reported that the chest pain was probably due to her muscles/ribs because it hurt more when she was moving.  Physical exam of cardiac and pulmonary system was unremarkable.  Low suspicion for ACS. PLAN:  -Patient was instructed to try Tylenol when she experiences this chest pain that is likely due to costochondritis -Patient was instructed to go to the emergency department if she experience severe, crushing chest pain or difficulty breathing

## 2023-04-22 NOTE — Assessment & Plan Note (Signed)
Patient presents with a 1 month history of left-sided eye discomfort after she had mud splashed in her eye.  She denies changes of vision and feels like "there is something in my eye".  Physical exam was unremarkable.  Left-sided pupillary response present, extraocular eye movements present and nonpainful.  No debris observed in the eye. PLAN: -Instructed to see her optometrist -Patient instructed to avoid contacts for the time being -Patient advised to use saline eyedrops

## 2023-04-22 NOTE — Assessment & Plan Note (Signed)
Patient presents today a depressed mood. During the appointment, she reported that she has suicidal thoughts, she denied a plan for suicide and HI. She attributes the depression  to her mother's death in 04/13/2022. She reports a strong support system at home.  PLAN: -begin Zoloft 50mg  -Referral to integrated behavior health sent  -Return in 2 weeks to discuss mood

## 2023-04-22 NOTE — Patient Instructions (Signed)
Systane eye drops: use saline eye drops!

## 2023-04-22 NOTE — Assessment & Plan Note (Signed)
Patient presented with a history of chronic tension headaches.  She was experiencing a headache during the appointment and stated "I feel like my head is going to explode".  She does have a history of migraines in the past, however this headache is not similar to her migraines.  She reported that the pain today is less than the migraine headache pain.  She reported that when she has these headaches her neck also feels tight and that the headaches will usually improve with sleep and Tylenol. PLAN: -Continue Tylenol

## 2023-04-22 NOTE — Assessment & Plan Note (Signed)
Patient reports a history of snoring along with observed apneic episodes while sleeping at night.  She was previously recommended to undergo a sleep study in the past but did not have insurance. PLAN: -Sleep study ordered

## 2023-04-22 NOTE — Progress Notes (Signed)
Established Patient Office Visit  Subjective   Patient ID: Samantha [name], female    DOB: 1975-12-23  Age: 47 y.o. MRN: [id number]  Chief Complaint  Patient presents with   Recurrent Skin Infections    Boil under her armpit   Referral    Eye doctor   Medication Refill    Methocarbamol for back pain   Hospitalization Follow-up    Chest pain, pt not having chest pain atm    Samantha [name] is a 47 year old female with a past medical history listed below who is here for an emergency department follow up for chest pain associated with diaphoreses, shortness of breath, and weakness on April 04, 2023. Please see problem based assessment and plan for additional details.     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   Depression    Phreesia 08/25/2020   Diabetes mellitus without complication (HCC)    Phreesia 08/25/2020   Hydradenitis    Hyperlipidemia    TIA (transient ischemic attack) 04/2015   questionable, most likely complicated migraine      Objective:     BP 111/78 (BP Location: Left Arm, Patient Position: Sitting, Cuff Size: Normal)   Pulse 78   Temp 98.3 F (36.8 C) (Oral)   Ht 5\' 4"  (1.626 m)   Wt 192 lb 1.6 oz (87.1 kg)   LMP 03/12/2023 (Exact Date)   SpO2 100%   BMI 32.97 kg/m  BP Readings from Last 3 Encounters:  04/22/23 111/78  04/04/23 (!) 142/128  02/05/23 109/78      Physical Exam Vitals reviewed.  Constitutional:      Appearance: She is obese. She is not diaphoretic.  Eyes:     General: No scleral icterus.       Right eye: No foreign body or discharge.        Left eye: No foreign body or discharge.     Extraocular Movements: Extraocular movements intact.     Conjunctiva/sclera: Conjunctivae normal.     Right eye: Right conjunctiva is not injected. No chemosis, exudate or hemorrhage.    Left eye: Left conjunctiva is not injected. No chemosis, exudate  or hemorrhage. Cardiovascular:     Rate and Rhythm: Normal rate and regular rhythm.     Heart sounds: Normal heart sounds.  Pulmonary:     Effort: Pulmonary effort is normal.     Breath sounds: Normal breath sounds.  Skin:    General: Skin is warm and dry.     Comments: Bilateral axilla comedones present, no drainage or abscess noted  Neurological:     Mental Status: She is alert and oriented to person, place, and time.      No results found for any visits on 04/22/23.  Last CBC Lab Results  Component Value Date   WBC 5.7 04/04/2023   HGB 11.8 (L) 04/04/2023   HCT 38.4 04/04/2023   MCV 85.5 04/04/2023   MCH 26.3 04/04/2023   RDW 13.1 04/04/2023   PLT 381 04/04/2023   Last metabolic panel Lab Results  Component Value Date   GLUCOSE 110 (H) 04/04/2023   NA 134 (L) 04/04/2023   K 3.7 04/04/2023   CL 103 04/04/2023   CO2 22 04/04/2023   BUN 11 04/04/2023   CREATININE 0.88 04/04/2023   GFRNONAA >60 04/04/2023   CALCIUM 9.0 04/04/2023  PROT 7.5 08/28/2022   ALBUMIN 3.6 08/28/2022   LABGLOB 2.6 11/10/2018   AGRATIO 1.7 11/10/2018   BILITOT 0.2 (L) 08/28/2022   ALKPHOS 74 08/28/2022   AST 20 08/28/2022   ALT 8 08/28/2022   ANIONGAP 9 04/04/2023   Last lipids Lab Results  Component Value Date   CHOL 242 (H) 07/11/2022   HDL 68 07/11/2022   LDLCALC 163 (H) 07/11/2022   TRIG 54 07/11/2022   CHOLHDL 3.6 07/11/2022   Last hemoglobin A1c Lab Results  Component Value Date   HGBA1C 5.8 (H) 07/11/2022      The ASCVD Risk score (Arnett DK, et al., 2019) failed to calculate for the following reasons:   The patient has a prior MI or stroke diagnosis    Assessment & Plan:   Problem List Items Addressed This Visit       Musculoskeletal and Integument   Hidradenitis suppurativa - Primary (Chronic)    Patient presents with a history of Hidradenitis suppurativa. Today, she reports a continuous cycle of "black heads" and abscesses that will "pop, drain, and go  away". She has tried doxycycline in the past and clindagel. Physical exam is remarkable for numerous comedones in the axilla, bilaterally. No abscess observed during physical exam.  PLAN: -Dermatology referral sent  -Refilled clinagel       Relevant Orders   Ambulatory referral to Dermatology     Other   Depressed mood    Patient presents today a depressed mood. During the appointment, she reported that she has suicidal thoughts, she denied a plan for suicide and HI. She attributes the depression  to her mother's death in 04/24/22. She reports a strong support system at home.  PLAN: -begin Zoloft 50mg  -Referral to integrated behavior health sent  -Return in 2 weeks to discuss mood       Relevant Orders   Ambulatory referral to Integrated Behavioral Health   Tension headache    Patient presented with a history of chronic tension headaches.  She was experiencing a headache during the appointment and stated "I feel like my head is going to explode".  She does have a history of migraines in the past, however this headache is not similar to her migraines.  She reported that the pain today is less than the migraine headache pain.  She reported that when she has these headaches her neck also feels tight and that the headaches will usually improve with sleep and Tylenol. PLAN: -Continue Tylenol      Relevant Medications   sertraline (ZOLOFT) 50 MG tablet   History of chest pain    Patient was recently evaluated in emergency department for chest pain, shortness of breath, weakness, sweating that began while she was laying in bed.  She experienced chest pain like this about a week prior while she was also laying in bed.  In the emergency department, an EKG was negative for ischemic changes compared to prior, troponins x 2 were within normal limits, chest x-ray showed probable elective changes of the left lung base.  The pain spontaneously resolved in emergency department and she eloped due to  starting her menstruation cycle.  Today, the patient reported that the chest pain was probably due to her muscles/ribs because it hurt more when she was moving.  Physical exam of cardiac and pulmonary system was unremarkable.  Low suspicion for ACS. PLAN:  -Patient was instructed to try Tylenol when she experiences this chest pain that is likely due to costochondritis -  Patient was instructed to go to the emergency department if she experience severe, crushing chest pain or difficulty breathing      Left eye pain    Patient presents with a 1 month history of left-sided eye discomfort after she had mud splashed in her eye.  She denies changes of vision and feels like "there is something in my eye".  Physical exam was unremarkable.  Left-sided pupillary response present, extraocular eye movements present and nonpainful.  No debris observed in the eye. PLAN: -Instructed to see her optometrist -Patient instructed to avoid contacts for the time being -Patient advised to use saline eyedrops      Sleep apnea-like behavior    Patient reports a history of snoring along with observed apneic episodes while sleeping at night.  She was previously recommended to undergo a sleep study in the past but did not have insurance. PLAN: -Sleep study ordered      Other Visit Diagnoses     Snoring       Relevant Orders   Nocturnal polysomnography (NPSG)        Return in about 2 weeks (around 05/06/2023) for depression follow up .    Faith Rogue, DO

## 2023-04-22 NOTE — Assessment & Plan Note (Signed)
Patient presents with a history of Hidradenitis suppurativa. Today, she reports a continuous cycle of "black heads" and abscesses that will "pop, drain, and go away". She has tried doxycycline in the past and clindagel. Physical exam is remarkable for numerous comedones in the axilla, bilaterally. No abscess observed during physical exam.  PLAN: -Dermatology referral sent  -Refilled clinagel

## 2023-04-23 ENCOUNTER — Other Ambulatory Visit: Payer: Self-pay

## 2023-04-23 ENCOUNTER — Telehealth: Payer: Self-pay

## 2023-04-23 NOTE — Telephone Encounter (Signed)
Prior Authorization for patient (Clindamycin Phosphate 1% gel) came through on cover my meds was submitted with last office notes awaiting approval or denial.  JYN:[id number]

## 2023-04-23 NOTE — Telephone Encounter (Signed)
Decision:Denied YOU ASKED FOR: Service Description Code 1 Code 2 Plan Requested  Dates Requested  Amount CLINDAMYCIN PHOSPHATE Gel [id number] Medicaid 04/23/2023 30 units WE DENIED: Service Description Code 1 Code 2 Plan Denied Dates Denied  Amount CLINDAMYCIN PHOSPHATE Gel [id number] Medicaid 04/23/2023 30 units COMMENTS:  Per the health plan's preferred drug list, at least 2 preferred drugs must be tried before requesting this drug  or tell us why the member cannot try any preferred alternatives. Please send [place] supporting chart notes and  lab results. Here is list of preferred alternatives: clindamycin phosphate pledgets / solution (generic for  Cleocin-T), clindamycin-benzoyl peroxide gel (generic for Duac). Per our records, the member has already tried none of the preferred medications. Note: Some preferred drug(s) may have quantity limits. Refer to the health plan's preferred drug list for  additional details.

## 2023-04-25 ENCOUNTER — Other Ambulatory Visit: Payer: Self-pay

## 2023-04-29 ENCOUNTER — Other Ambulatory Visit: Payer: Self-pay

## 2023-04-29 ENCOUNTER — Other Ambulatory Visit: Payer: Self-pay | Admitting: Student

## 2023-04-29 MED ORDER — CLINDAMYCIN PHOS-BENZOYL PEROX 1.2-5 % EX GEL
CUTANEOUS | 0 refills | Status: DC
  Filled 2023-04-29: qty 45, 15d supply, fill #0

## 2023-04-30 ENCOUNTER — Other Ambulatory Visit: Payer: Self-pay

## 2023-04-30 NOTE — Progress Notes (Signed)
 Internal Medicine Clinic Attending  I was physically present during the key portions of the resident provided service and participated in the medical decision making of patient's management care. I reviewed pertinent patient test results.  The assessment, diagnosis, and plan were formulated together and I agree with the documentation in the resident's note.  Inez Catalina, MD

## 2023-05-07 ENCOUNTER — Ambulatory Visit (INDEPENDENT_AMBULATORY_CARE_PROVIDER_SITE_OTHER): Payer: Self-pay | Admitting: Licensed Clinical Social Worker

## 2023-05-07 DIAGNOSIS — R4589 Other symptoms and signs involving emotional state: Secondary | ICD-10-CM

## 2023-05-08 NOTE — BH Specialist Note (Signed)
Patient no-showed today's appointment; appointment was for 3:30 pm phone visit. [facility] contacted patient early at 3:15pm in error. [facility] left a VM. [facility] tried patient again on time and line was busy.  [facility] will attempt to contact patient again and reschedule. If unsuccessful, patient will need to reschedule appointment by calling Internal medicine center [phone].  Christen Butter, MSW, LCSW-A She/Her Behavioral Health Clinician [facility]  Internal Medicine Center Direct [phone]  Fax [phone] Main Office Phone: [phone] [address] [place] [place], [place] [address] Website: [facility] Internal Medicine [facility]  [place], [place]  [place]

## 2023-05-12 ENCOUNTER — Encounter (HOSPITAL_COMMUNITY): Payer: Self-pay | Admitting: Emergency Medicine

## 2023-05-12 ENCOUNTER — Ambulatory Visit (HOSPITAL_COMMUNITY)
Admission: EM | Admit: 2023-05-12 | Discharge: 2023-05-12 | Disposition: A | Discharge status: Home / Self Care | Payer: Medicaid Other | Attending: Urgent Care | Admitting: Urgent Care

## 2023-05-12 DIAGNOSIS — L732 Hidradenitis suppurativa: Secondary | ICD-10-CM | POA: Diagnosis present

## 2023-05-12 DIAGNOSIS — N739 Female pelvic inflammatory disease, unspecified: Secondary | ICD-10-CM | POA: Diagnosis present

## 2023-05-12 LAB — POCT URINALYSIS DIP (MANUAL ENTRY)
Bilirubin, UA: NEGATIVE
Glucose, UA: NEGATIVE mg/dL
Ketones, POC UA: NEGATIVE mg/dL
Leukocytes, UA: NEGATIVE
Nitrite, UA: NEGATIVE
Protein Ur, POC: NEGATIVE mg/dL
Spec Grav, UA: >=1.03 — AB (ref 1.010–1.025)
Urobilinogen, UA: 0.2 U/dL
pH, UA: 5.5 (ref 5.0–8.0)

## 2023-05-12 MED ORDER — METRONIDAZOLE 500 MG PO TABS
500.0000 mg | ORAL_TABLET | Freq: Two times a day (BID) | ORAL | 0 refills | Status: AC
  Filled 2023-05-12: qty 28, 14d supply, fill #0

## 2023-05-12 MED ORDER — LIDOCAINE HCL (PF) 1 % IJ SOLN
INTRAMUSCULAR | Status: AC
  Filled 2023-05-12: qty 2

## 2023-05-12 MED ORDER — METRONIDAZOLE 500 MG PO TABS: 500.0000 mg | ORAL_TABLET | Freq: Two times a day (BID) | ORAL | 0 refills | Status: DC

## 2023-05-12 MED ORDER — CLINDAMYCIN PHOSPHATE 1 % EX SWAB: 1.0000 | Freq: Two times a day (BID) | CUTANEOUS | 2 refills | Status: DC

## 2023-05-12 MED ORDER — CLINDAMYCIN PHOSPHATE 1 % EX SWAB
1.0000 | Freq: Two times a day (BID) | CUTANEOUS | 2 refills | Status: DC
  Filled 2023-05-12: qty 60, 30d supply, fill #0

## 2023-05-12 MED ORDER — CEFTRIAXONE SODIUM 1 G IJ SOLR
1.0000 g | Freq: Once | INTRAMUSCULAR | Status: AC
  Administered 2023-05-12: 1 g via INTRAMUSCULAR

## 2023-05-12 MED ORDER — DOXYCYCLINE HYCLATE 100 MG PO TABS
100.0000 mg | ORAL_TABLET | Freq: Two times a day (BID) | ORAL | 0 refills | Status: AC
  Filled 2023-05-12: qty 28, 14d supply, fill #0

## 2023-05-12 MED ORDER — DOXYCYCLINE HYCLATE 100 MG PO CAPS: 100.0000 mg | ORAL_CAPSULE | Freq: Two times a day (BID) | ORAL | 0 refills | Status: DC

## 2023-05-12 MED ORDER — CEFTRIAXONE SODIUM 1 G IJ SOLR
INTRAMUSCULAR | Status: AC
  Filled 2023-05-12: qty 10

## 2023-05-12 NOTE — Discharge Instructions (Signed)
Your urine does not show signs of infection. We will send out a culture for confirmation. Your symptoms are most consistent with pelvic inflammatory disease, which is usually due to an infection in uterus. We have sent off a vaginal swab for confirmation. You were given an injection of an antibiotic here. Please start taking metronidazole and doxycycline twice daily. Take both of them until gone, a total of 14 days. Avoid excessive sun exposure while on the antibiotics, drink plenty of water, and avoid alcohol while taking. Please avoid all forms of intercourse until results obtained.  To help with your hidradenitis, please use the clindamycin pledgets.  This is better for prevention.  You can use this 1-2 times daily on a daily basis.  The oral doxycycline should help with your current axillary boil.

## 2023-05-12 NOTE — ED Provider Notes (Signed)
[facility]    CSN: [id number] Arrival date & time: 05/12/23  1044      History   Chief Complaint Chief Complaint  Patient presents with   Abdominal Pain    HPI Samantha [name] is a 47 y.o. female.   Pleasant 47y o female presents today with concern of a one week hx of lower abdominal pain and pressure in her bladder worse with urinating. States it started out last week sporadically, and would come and go. Has gotten progressively more severe and more constant. Reports the pain is suprapubic, worse with urinating. LNMP was 05/01/23-05/05/23. States she is having increase in vaginal mucous with scant blood when wiping but otherwise denies vaginal discharge. No itching. Is monogamous with boyfriend. Denies change in BM - no constipation or diarrhea. No tx tried. No hematuria or hematochezia. Does have hx of "abscess on ovary." Pt denies any upper abdominal symptoms. No change in PO intake, no fever or additional systemic symptoms. Additionally, pt c/o axillary boil. Has known hx of HS. Using topical benzaclin gel without relief.    Abdominal Pain   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   Depression    Phreesia 08/25/2020   Diabetes mellitus without complication (HCC)    Phreesia 08/25/2020   Hydradenitis    Hyperlipidemia    TIA (transient ischemic attack) 04/2015   questionable, most likely complicated migraine    Patient Active Problem List   Diagnosis Date Noted   Tension headache 04/22/2023   History of chest pain 04/22/2023   Left eye pain 04/22/2023   Sleep apnea-like behavior 04/22/2023   Insurance coverage problems 07/27/2022   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   Migraine headache with aura 07/11/2022   Amenorrhea 09/17/2017   Adjustment disorder with mixed anxiety and depressed mood 06/26/2016   Depressed  mood 06/26/2016   Back pain 01/31/2016   Caffeine abuse, 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   Acute pericarditis 12/26/2015   Hidradenitis suppurativa 11/07/2015   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   Sinusitis 10/20/2015   Complicated migraine 10/20/2015   Marijuana use 10/20/2015   Tobacco use 05/04/2015   TIA (transient ischemic attack) 05/04/2015   Palpitations 05/04/2015   Decreased appetite 05/04/2015   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HCC) 04/22/2015   Hyperlipidemia 04/22/2015   TOA (tubo-ovarian abscess) 11/23/2012   History of abnormal Pap smear 12/20/2011   History of PID 12/20/2011    Past Surgical History:  Procedure Laterality Date   CERVICAL BIOPSY  W/ LOOP ELECTRODE EXCISION     ESOPHAGOGASTRODUODENOSCOPY (EGD) WITH PROPOFOL N/A 10/14/2018   Procedure: ESOPHAGOGASTRODUODENOSCOPY (EGD) WITH PROPOFOL;  Surgeon: Charlott Rakes, MD;  Location: [facility];  Service: Endoscopy;  Laterality: N/A;   LEEP      OB History     Gravida  2   Para  1   Term  1   Preterm      AB  1   Living  1      SAB  1   IAB      Ectopic      Multiple      Live Births               Home Medications    Prior to Admission medications   Medication Sig Start Date End Date Taking? Authorizing Provider  albuterol (VENTOLIN HFA)  108 (90 Base) MCG/ACT inhaler Inhale 2 puffs into the lungs every 4 (four) hours as needed for wheezing or shortness of breath. 06/12/22   Jannifer Franklin, MD  aspirin EC 81 MG tablet Take 1 tablet (81 mg total) by mouth daily. Swallow whole. 07/13/22   Adron Bene, MD  atorvastatin (LIPITOR) 40 MG tablet Take 1 tablet (40 mg total) by mouth daily. 10/19/22   Adron Bene, MD  clindamycin (CLEOCIN T) 1 % SWAB Apply 1 Swab topically 2 (two) times daily. 05/12/23   Leverett Camplin L, PA  diazepam (VALIUM) 5 MG tablet Take 0.5-1 tablets (2.5-5 mg total) by mouth at bedtime as needed for anxiety. 08/29/22   Cardama, Amadeo Garnet, MD  doxycycline (VIBRAMYCIN) 100 MG  capsule Take 1 capsule (100 mg total) by mouth 2 (two) times daily for 14 days. 05/12/23 05/26/23  Guy Sandifer L, PA  metFORMIN (GLUCOPHAGE) 500 MG tablet Take 1 tablet (500 mg total) by mouth 2 (two) times daily with a meal. 10/19/22   Adron Bene, MD  metroNIDAZOLE (FLAGYL) 500 MG tablet Take 1 tablet (500 mg total) by mouth 2 (two) times daily for 14 days. 05/12/23 05/26/23  Dorsey Authement, Alphonzo Lemmings L, PA  sertraline (ZOLOFT) 50 MG tablet Take 1 tablet (50 mg total) by mouth daily. 04/22/23   Faith Rogue, DO    Family History Family History  Problem Relation Age of Onset   Hypertension Mother    Lupus Mother    Diabetes Father    Thyroid disease Sister    Diabetes Sister    Cancer Sister        stomach   Diabetes Maternal Grandmother    Anesthesia problems Neg Hx    Other Neg Hx    Migraines Neg Hx     Social History Social History   Tobacco Use   Smoking status: Former    Current packs/day: 1.00    Average packs/day: 1 pack/day for 17.0 years (17.0 ttl pk-yrs)    Types: Cigars, Cigarettes   Smokeless tobacco: Never   Tobacco comments:    quit 02/08/2020  Vaping Use   Vaping status: Every Day  Substance Use Topics   Alcohol use: Yes    Alcohol/week: 1.0 standard drink of alcohol    Types: 1 Shots of liquor per week    Comment: drink on the weekends   Drug use: Not Currently    Frequency: 3.0 times per week    Types: Marijuana     Allergies   Patient has no known allergies.   Review of Systems Review of Systems  Gastrointestinal:  Positive for abdominal pain.  As per HPI   Physical Exam Triage Vital Signs ED Triage Vitals  Encounter Vitals Group     BP 05/12/23 1137 107/70     Systolic BP Percentile --      Diastolic BP Percentile --      Pulse Rate 05/12/23 1137 80     Resp 05/12/23 1137 18     Temp 05/12/23 1137 98.1 F (36.7 C)     Temp Source 05/12/23 1137 Oral     SpO2 05/12/23 1137 97 %     Weight --      Height --      Head Circumference --       Peak Flow --      Pain Score 05/12/23 1136 10     Pain Loc --      Pain Education --      Exclude  from Growth Chart --    No data found.  Updated Vital Signs BP 107/70 (BP Location: Left Arm)   Pulse 80   Temp 98.1 F (36.7 C) (Oral)   Resp 18   LMP 05/08/2023   SpO2 97%   Visual Acuity Right Eye Distance:   Left Eye Distance:   Bilateral Distance:    Right Eye Near:   Left Eye Near:    Bilateral Near:     Physical Exam Vitals and nursing note reviewed.  Constitutional:      General: She is not in acute distress.    Appearance: She is well-developed. She is obese. She is not ill-appearing, toxic-appearing or diaphoretic.  HENT:     Head: Normocephalic.     Mouth/Throat:     Mouth: Mucous membranes are moist.     Pharynx: No pharyngeal swelling.  Eyes:     General: No scleral icterus.    Extraocular Movements: Extraocular movements intact.     Pupils: Pupils are equal, round, and reactive to light.  Cardiovascular:     Rate and Rhythm: Normal rate.  Pulmonary:     Effort: Pulmonary effort is normal. No respiratory distress.     Breath sounds: No wheezing.  Abdominal:     General: Abdomen is flat. Bowel sounds are normal. There is no distension. There are no signs of injury.     Palpations: Abdomen is soft. There is no shifting dullness, fluid wave, hepatomegaly, splenomegaly, mass or pulsatile mass.     Tenderness: There is abdominal tenderness in the suprapubic area. There is no right CVA tenderness, left CVA tenderness, guarding or rebound. Negative signs include Murphy's sign, Rovsing's sign, McBurney's sign, psoas sign and obturator sign.     Hernia: No hernia is present.  Genitourinary:    General: Normal vulva.     Pubic Area: No rash or pubic lice.      Labia:        Right: No rash, tenderness, lesion or injury.        Left: No rash, tenderness, lesion or injury.      Urethra: No prolapse, urethral pain, urethral swelling or urethral lesion.     Vagina:  No signs of injury and foreign body. Vaginal discharge present. No erythema, tenderness, bleeding, lesions or prolapsed vaginal walls.     Cervix: Cervical motion tenderness present. No discharge, friability, lesion, erythema or eversion.     Uterus: Normal. Not deviated, not enlarged, not fixed, not tender and no uterine prolapse.      Adnexa: Right adnexa normal and left adnexa normal.       Right: No mass, tenderness or fullness.         Left: No mass, tenderness or fullness.       Rectum: Normal.  Skin:    General: Skin is warm and dry.     Coloration: Skin is not cyanotic.     Findings: No erythema or rash (HS with single boil to R axilla).  Neurological:     General: No focal deficit present.     Mental Status: She is alert and oriented to person, place, and time.      UC Treatments / Results  Labs (all labs ordered are listed, but only abnormal results are displayed) Labs Reviewed  POCT URINALYSIS DIP (MANUAL ENTRY) - Abnormal; Notable for the following components:      Result Value   Clarity, UA cloudy (*)    Spec Grav, UA >=1.030 (*)  Blood, UA trace-lysed (*)    All other components within normal limits  URINE CULTURE  CERVICOVAGINAL ANCILLARY ONLY    EKG   Radiology No results found.  Procedures Procedures (including critical care time)  Medications Ordered in UC Medications  cefTRIAXone (ROCEPHIN) injection 1 g (1 g Intramuscular Given 05/12/23 1247)    Initial Impression / Assessment and Plan / UC Course  I have reviewed the triage vital signs and the nursing notes.  Pertinent labs & imaging results that were available during my care of the patient were reviewed by me and considered in my medical decision making (see chart for details).     PID - UA negative for UTI on office dip.  Will send out urine culture for confirmation.  Due to cervical motion tenderness and history of PID in the past, Aptima swab was collected and will treat for symptoms of  the same.  Ceftriaxone administered, patient discharged home on Doxy and Flagyl x 2 weeks. F/U with PCP in one week for recheck, ER for any new or worsening symptoms. HS -p.o. doxycycline should help with her single boil.  Will recommend she switch to clindamycin pledgets and uses daily for prevention rather than for active treatment.  Follow-up with dermatology should symptoms persist.    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 We will send out a culture for confirmation. Your symptoms are most consistent with pelvic inflammatory disease, which is usually due to an infection in uterus. We have sent off a vaginal swab for confirmation. You were given an injection of an antibiotic here. Please start taking metronidazole and doxycycline twice daily. Take both of them until gone, a total of 14 days. Avoid excessive sun exposure while on the antibiotics, drink plenty of water, and avoid alcohol while taking. Please avoid all forms of intercourse until results obtained.  To help with your hidradenitis, please use the clindamycin pledgets.  This is better for prevention.  You can use this 1-2 times daily on a daily basis.  The oral doxycycline should help with your current axillary boil.     ED Prescriptions     Medication Sig Dispense Auth. Provider   clindamycin (CLEOCIN T) 1 % SWAB  (Status: Discontinued) Apply 1 Swab topically 2 (two) times daily. 60 each Jadalynn Burr L, PA   doxycycline (VIBRAMYCIN) 100 MG capsule  (Status: Discontinued) Take 1 capsule (100 mg total) by mouth 2 (two) times daily for 14 days. 28 capsule Koleson Reifsteck L, PA   metroNIDAZOLE (FLAGYL) 500 MG tablet  (Status: Discontinued) Take 1 tablet (500 mg total) by mouth 2 (two) times daily for 14 days. 28 tablet Vishnu Moeller L, PA   clindamycin  (CLEOCIN T) 1 % SWAB Apply 1 Swab topically 2 (two) times daily. 60 each Pavlos Yon L, PA   doxycycline (VIBRAMYCIN) 100 MG capsule Take 1 capsule (100 mg total) by mouth 2 (two) times daily for 14 days. 28 capsule Dejanay Wamboldt L, PA   metroNIDAZOLE (FLAGYL) 500 MG tablet Take 1 tablet (500 mg total) by mouth 2 (two) times daily for 14 days. 28 tablet Lexxi Koslow L, [place]      PDMP not reviewed this encounter.   Maretta Bees, [place] 05/12/23 1314

## 2023-05-12 NOTE — ED Triage Notes (Signed)
Pt c/o lower abd pains for a week. Reports when bears down to urinate it hurts.

## 2023-05-13 LAB — URINE CULTURE: Culture: NO GROWTH

## 2023-05-14 ENCOUNTER — Other Ambulatory Visit: Payer: Self-pay

## 2023-05-15 ENCOUNTER — Other Ambulatory Visit: Payer: Self-pay

## 2023-05-16 LAB — CERVICOVAGINAL ANCILLARY ONLY
Bacterial Vaginitis (gardnerella): NEGATIVE
Candida Glabrata: NEGATIVE
Candida Vaginitis: NEGATIVE
Chlamydia: NEGATIVE
Comment: NEGATIVE
Comment: NEGATIVE
Comment: NEGATIVE
Comment: NEGATIVE
Comment: NEGATIVE
Comment: NORMAL
Neisseria Gonorrhea: NEGATIVE
Trichomonas: NEGATIVE

## 2023-05-20 ENCOUNTER — Other Ambulatory Visit: Payer: Self-pay

## 2023-05-20 ENCOUNTER — Ambulatory Visit (INDEPENDENT_AMBULATORY_CARE_PROVIDER_SITE_OTHER): Payer: Medicaid Other | Admitting: Internal Medicine

## 2023-05-20 ENCOUNTER — Encounter: Payer: Self-pay | Admitting: Internal Medicine

## 2023-05-20 VITALS — BP 103/71 | HR 83 | Temp 98.0°F | Ht 64.0 in | Wt 190.4 lb

## 2023-05-20 DIAGNOSIS — L732 Hidradenitis suppurativa: Secondary | ICD-10-CM

## 2023-05-20 DIAGNOSIS — Z8742 Personal history of other diseases of the female genital tract: Secondary | ICD-10-CM | POA: Diagnosis present

## 2023-05-20 DIAGNOSIS — F4323 Adjustment disorder with mixed anxiety and depressed mood: Secondary | ICD-10-CM

## 2023-05-20 DIAGNOSIS — G4739 Other sleep apnea: Secondary | ICD-10-CM

## 2023-05-20 NOTE — Assessment & Plan Note (Addendum)
Patient reports one month of intermittent stabbing left lower quadrant pain radiating to her back. She also reports for approximately the last year her menstrual cycles have been more frequent, occurring approximately every 3 weeks with heavier bleeding during the first two days. She went to the ED last month for abdominal pain, fullness, and discharge. UA, urine culture, and STI testing at the time were negative. Patient was discharged with antibiotics which she did not take due to negative lab results. Patient reports having an ovarian cyst several years ago for which she had have a surgical drain placed.  -Urgent transvaginal ultrasound ordered -Gynecology consult placed -Instructed patient to take antibiotics that were previously prescribed, call for another appointment if symptoms persist after abx course is completed

## 2023-05-20 NOTE — Assessment & Plan Note (Signed)
Hidradenitis suppurativa remains bothersome but stable. Dermatology referral sent at last visit, has not heard from them yet.

## 2023-05-20 NOTE — Progress Notes (Signed)
Subjective:  CC: f/u depression, abdominal pain  HPI:  Samantha [name] is a 47 y.o. female with a past medical history stated below and presents today for above. Please see problem based assessment and plan for additional details.  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 6.7 g 0   aspirin EC 81 MG tablet Take 1 tablet (81 mg total) by mouth daily. Swallow whole. 30 tablet 12   atorvastatin (LIPITOR) 40 MG tablet Take 1 tablet (40 mg total) by mouth daily. 30 tablet 2   clindamycin (CLEOCIN T) 1 % SWAB Apply 1 Swab topically 2 (two) times daily. 60 each 2   diazepam (VALIUM) 5 MG tablet Take 0.5-1 tablets (2.5-5 mg total) by mouth at bedtime as needed for anxiety. 10 tablet 0   doxycycline (VIBRA-TABS) 100 MG tablet Take 1 tablet (100 mg total) by mouth 2 (two) times daily for 14 days. 28 tablet 0   metFORMIN (GLUCOPHAGE) 500 MG tablet Take 1 tablet (500 mg total) by mouth 2 (two) times daily with a meal. 180 tablet 3   metroNIDAZOLE (FLAGYL) 500 MG tablet Take 1 tablet (500 mg total) by mouth 2 (two) times daily for 14 days. 28 tablet 0   sertraline (ZOLOFT) 50 MG tablet Take 1 tablet (50 mg total) by mouth daily. 30 tablet 1   No current facility-administered medications on file prior to visit.    Review of Systems: ROS negative except for as is noted on the assessment and plan.  Objective:   Vitals:   05/20/23  1100  BP: 103/71  Pulse: 83  Temp: 98 F (36.7 C)  TempSrc: Oral  SpO2: 100%  Weight: 190 lb 6.4 oz (86.4 kg)  Height: 5\' 4"  (1.626 m)    Physical Exam: Constitutional: well-appearing, in no acute distress HENT: normocephalic atraumatic, mucous membranes moist Eyes: conjunctiva non-erythematous Neck: supple Cardiovascular: regular rate and rhythm, no m/r/g Pulmonary/Chest: normal work of breathing on room air, lungs clear to auscultation bilaterally Abdominal: moderately tender to palpation in LLQ MSK: normal bulk and tone Neurological: alert & oriented x 3, 5/5 strength in bilateral upper and lower extremities, normal gait Skin: warm and dry  Assessment & Plan:   History of PID Patient reports one month of intermittent stabbing left lower quadrant pain radiating to her back. She also reports for approximately the last year her menstrual cycles have been more frequent, occurring approximately every 3 weeks with heavier bleeding during the first two days. She went to the ED last month for abdominal pain, fullness, and discharge. UA, urine culture, and STI testing at the time were negative. Patient was discharged with antibiotics which she did not take due to negative lab results. Patient reports having an ovarian cyst several years ago for which she had have a surgical drain placed.  -Urgent transvaginal ultrasound ordered -Gynecology consult placed -Instructed  patient to take antibiotics that were previously prescribed, call for another appointment if symptoms persist after abx course is completed  Adjustment disorder with mixed anxiety and depressed mood Patient reports continued depressed mood since last visit, though no SI/HI. She has not been taking her prescribed Zoloft due to concerns about side effects and possible hair loss. Side effects discussed, patient was amenable to taking Zoloft. Behavioral health was also consulted at last visit, though patient missed their call.   -Take Zoloft 50mg  daily. Side effects addressed -Be on lookout for call from behavioral health  Sleep apnea-like behavior Consult for sleep study sent, has not heard back yet.   Hidradenitis suppurativa Hidradenitis suppurativa remains bothersome but stable. Dermatology referral sent at last visit, has not heard from them yet.     Patient seen with Dr. Rance Muir MD [facility]  PGY-1 Pager: [phone] Date 05/20/2023  Time 1:22 PM

## 2023-05-20 NOTE — Assessment & Plan Note (Signed)
Consult for sleep study sent, has not heard back yet.

## 2023-05-20 NOTE — Assessment & Plan Note (Signed)
Patient reports continued depressed mood since last visit, though no SI/HI. She has not been taking her prescribed Zoloft due to concerns about side effects and possible hair loss. Side effects discussed, patient was amenable to taking Zoloft. Behavioral health was also consulted at last visit, though patient missed their call.  -Take Zoloft 50mg  daily. Side effects addressed -Be on lookout for call from behavioral health

## 2023-05-21 NOTE — Progress Notes (Signed)
Internal Medicine Clinic Attending  I was physically present during the key portions of the resident provided service and participated in the medical decision making of patient's management care. I reviewed pertinent patient test results.  The assessment, diagnosis, and plan were formulated together and I agree with the documentation in the resident's note.  Treated for PID in 2013. Left tubal ovarian abscess diagnosed in 2014 requiring IR drainage. Seen in the ED 9/1 for LLQ pain. Pelvic exam with cervical motion tenderness and vaginal discharge. Received CTX and discharged with doxycycline/metronidazole for PID. Although cervical swab was negative for common pathogens, upper tract infection cannot be excluded and we would recommend completion of antibiotic course given persistent symptoms and current/previous exam findings. No current fevers. No urinary complaints. Sexually active with one partner, does not use barrier contraception. Urgent transvaginal US and referral to gynecology placed. Return/ED precautions provided if symptoms are not improving or worsening.   Dickie La, MD

## 2023-05-21 NOTE — Addendum Note (Signed)
Addended by: Dickie La on: 05/21/2023 02:50 PM   Modules accepted: Level of Service

## 2023-05-28 ENCOUNTER — Ambulatory Visit (HOSPITAL_COMMUNITY)
Admission: RE | Admit: 2023-05-28 | Discharge: 2023-05-28 | Disposition: A | Discharge status: Home / Self Care | Payer: Medicaid Other | Source: Ambulatory Visit | Attending: Internal Medicine | Admitting: Internal Medicine

## 2023-05-28 DIAGNOSIS — Z8742 Personal history of other diseases of the female genital tract: Secondary | ICD-10-CM | POA: Diagnosis present

## 2023-06-18 ENCOUNTER — Ambulatory Visit (HOSPITAL_BASED_OUTPATIENT_CLINIC_OR_DEPARTMENT_OTHER): Payer: Medicaid Other | Attending: Internal Medicine | Admitting: Internal Medicine

## 2023-06-18 VITALS — Ht 64.0 in | Wt 190.0 lb

## 2023-06-18 DIAGNOSIS — R0683 Snoring: Secondary | ICD-10-CM | POA: Diagnosis present

## 2023-06-18 DIAGNOSIS — I493 Ventricular premature depolarization: Secondary | ICD-10-CM | POA: Diagnosis not present

## 2023-06-22 DIAGNOSIS — R0683 Snoring: Secondary | ICD-10-CM

## 2023-06-22 NOTE — Procedures (Signed)
Patient Name: Samantha [name], Samantha [name] Date: 06/18/2023 Gender: Female D.O.B: 05/05/1976 Age (years): 47 Referring Provider: Faith Rogue DO Height (inches): 64 Interpreting Physician: Jetty Duhamel MD, ABSM Weight (lbs): 190 RPSGT: Shelah Lewandowsky BMI: 33 MRN: [id number]  CLINICAL INFORMATION Sleep Study Type: NPSG  Indication for sleep study: Daytime Fatigue, Hypertension, Insomnia, Morning Headaches, Non-refreshing Sleep, Snoring, Witnesses Apnea / Gasping During Sleep Epworth Sleepiness Score: 4  SLEEP STUDY TECHNIQUE As per the AASM Manual for the Scoring of Sleep and Associated Events v2.3 (April 2016) with a hypopnea requiring 4% desaturations.  The channels recorded and monitored were frontal, central and occipital EEG, electrooculogram (EOG), submentalis EMG (chin), nasal and oral airflow, thoracic and abdominal wall motion, anterior tibialis EMG, snore microphone, electrocardiogram, and pulse oximetry.  MEDICATIONS Medications self-administered by patient taken the night of the study : N/A  SLEEP ARCHITECTURE The study was initiated at 10:27:41 PM and ended at 4:53:55 AM.  Sleep onset time was 57.6 minutes and the sleep efficiency was 51.5%. The total sleep time was 199 minutes.  Stage REM latency was 201.0 minutes.  The patient spent 13.3% of the night in stage N1 sleep, 69.6% in stage N2 sleep, 0.0% in stage N3 and 17.1% in REM.  Alpha intrusion was absent.  Supine sleep was 86.68%.  RESPIRATORY PARAMETERS The overall apnea/hypopnea index (AHI) was 1.5 per hour. There were 1 total apneas, including 1 obstructive, 0 central and 0 mixed apneas. There were 4 hypopneas and 34 RERAs.  The AHI during Stage REM sleep was 5.3 per hour.  AHI while supine was 1.7 per hour.  The mean oxygen saturation was 94.6%. The minimum SpO2 during sleep was 91.0%.  moderate snoring was noted during this study.  CARDIAC DATA The 2 lead EKG demonstrated sinus rhythm. The mean  heart rate was 61.6 beats per minute. Other EKG findings include: PVCs.  LEG MOVEMENT DATA The total PLMS were 0 with a resulting PLMS index of 0.0. Associated arousal with leg movement index was 0.0 .  IMPRESSIONS - No significant obstructive sleep apnea occurred during this study (AHI = 1.5/h). - The patient had minimal or no oxygen desaturation during the study (Min O2 = 91.0%) - The patient snored with moderate snoring volume. - EKG findings include PVCs. - Clinically significant periodic limb movements did not occur during sleep. No significant associated arousals.  DIAGNOSIS - Primary Snoring  RECOMMENDATIONS - Manage for snoring and symptoms based on clinical judgment. - Sleep hygiene should be reviewed to assess factors that may improve sleep quality. - Weight management and regular exercise should be initiated or continued if appropriate.  [Electronically signed] 06/22/2023 11:40 AM  Jetty Duhamel MD, ABSM Diplomate, American Board of Sleep Medicine NPI: [id number]                          Jetty Duhamel Diplomate, American Board of Sleep Medicine  ELECTRONICALLY SIGNED ON:  06/22/2023, 11:32 AM [facility] PH: [phone]   FX: [phone] ACCREDITED BY THE AMERICAN ACADEMY OF SLEEP MEDICINE

## 2023-07-22 ENCOUNTER — Encounter: Payer: Self-pay | Admitting: Internal Medicine

## 2023-07-22 ENCOUNTER — Ambulatory Visit: Payer: Medicaid Other | Admitting: Internal Medicine

## 2023-07-22 VITALS — BP 115/78 | HR 81 | Temp 98.4°F | Ht 64.0 in | Wt 195.7 lb

## 2023-07-22 DIAGNOSIS — G479 Sleep disorder, unspecified: Secondary | ICD-10-CM

## 2023-07-22 DIAGNOSIS — Z8742 Personal history of other diseases of the female genital tract: Secondary | ICD-10-CM

## 2023-07-22 DIAGNOSIS — G4739 Other sleep apnea: Secondary | ICD-10-CM

## 2023-07-22 DIAGNOSIS — F4323 Adjustment disorder with mixed anxiety and depressed mood: Secondary | ICD-10-CM | POA: Diagnosis present

## 2023-07-22 NOTE — Patient Instructions (Addendum)
Samantha [name],   It was a pleasure seeing you today.   For your difficulty sleeping, I am sending a referral to the sleep medicine doctor.   I am glad everything else is going so well. We will plan to see you again in about six months for a routine visit.   Thanks,  Dr Carlynn Purl

## 2023-07-22 NOTE — Assessment & Plan Note (Signed)
Patient was seen in clinic two months ago for LLQ pain and vaginal discharge. She has completed course of empiric antibiotics for presumed PID. Her symptoms have greatly improved since completing abx. Denies current pain, discharge, fever. Also had transvaginal ultrasound performed which showed 2.8cm fundal fibroid. Counseled on strict return precautions to the ED if her symptoms should arise again.

## 2023-07-22 NOTE — Assessment & Plan Note (Signed)
Completed sleep study which showed no sleep apnea. Patient still feels she stops breathing during the night, additionally has difficulty sleeping due to racing thoughts.  -Referral to neurology for sleep medicine consult

## 2023-07-22 NOTE — Assessment & Plan Note (Signed)
Patient was started on Zoloft 50mg  at last visit for depressed mood. She feels her mood has been improved since starting medication. Denies side effects.  -Continue Zoloft 50mg 

## 2023-07-22 NOTE — Progress Notes (Signed)
Subjective:  CC: follow up pelvic pain  HPI:  [name] is a 47 y.o. female with a past medical history stated below and presents today for above. Please see problem based assessment and plan for additional details.  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 6.7 g 0   aspirin EC 81 MG tablet Take 1 tablet (81 mg total) by mouth daily. Swallow whole. 30 tablet 12   atorvastatin (LIPITOR) 40 MG tablet Take 1 tablet (40 mg total) by mouth daily. 30 tablet 2   clindamycin (CLEOCIN T) 1 % SWAB Apply 1 Swab topically 2 (two) times daily. 60 each 2   diazepam (VALIUM) 5 MG tablet Take 0.5-1 tablets (2.5-5 mg total) by mouth at bedtime as needed for anxiety. 10 tablet 0   metFORMIN (GLUCOPHAGE) 500 MG tablet Take 1 tablet (500 mg total) by mouth 2 (two) times daily with a meal. 180 tablet 3   sertraline (ZOLOFT) 50 MG tablet Take 1 tablet (50 mg total) by mouth daily. 30 tablet 1   No current facility-administered medications on file prior to visit.    Review of Systems: ROS negative except for as is noted on the assessment and plan.  Objective:   Vitals:   07/22/23 1000  BP: 115/78  Pulse: 81  Temp: 98.4 F (36.9 C)  TempSrc: Oral  SpO2: 100%  Weight: 195 lb 11.2 oz (88.8 kg)  Height: 5\' 4"  (1.626 m)    Physical Exam: Constitutional: well-appearing, in no acute distress HENT: normocephalic atraumatic,  mucous membranes moist Eyes: conjunctiva non-erythematous Neck: supple Cardiovascular: regular rate and rhythm, no m/r/g Pulmonary/Chest: normal work of breathing on room air, lungs clear to auscultation bilaterally Abdominal: soft, non-tender, non-distended MSK: normal bulk and tone Neurological: alert & oriented x 3, 5/5 strength in bilateral upper and lower extremities, normal gait Skin: warm and dry  Assessment & Plan:   History of PID Patient was seen in clinic two months ago for LLQ pain and vaginal discharge. She has completed course of empiric antibiotics for presumed PID. Her symptoms have greatly improved since completing abx. Denies current pain, discharge, fever. Also had transvaginal ultrasound performed which showed 2.8cm fundal fibroid. Counseled on strict return precautions to the ED if her symptoms should arise again.   Adjustment disorder with mixed anxiety and depressed mood Patient was started on Zoloft 50mg  at last visit for depressed mood. She feels her mood has been improved since starting medication. Denies side effects.  -Continue Zoloft 50mg   Sleep apnea-like behavior Completed sleep study which showed no sleep apnea. Patient still feels she stops breathing during the night, additionally has difficulty sleeping due to racing thoughts.  -Referral to neurology for sleep medicine consult    Patient seen with Dr. Bari Edward MD [facility]  PGY-1 Pager: [phone] Date 07/22/2023  Time 10:35  AM

## 2023-07-24 NOTE — Progress Notes (Signed)
Internal Medicine Clinic Attending  I was physically present during the key portions of the resident provided service and participated in the medical decision making of patient's management care. I reviewed pertinent patient test results.  The assessment, diagnosis, and plan were formulated together and I agree with the documentation in the resident's note.  Patient concerned that she didn't sleep well enough and sleep study did not actually capture her concerns of oversleeping and increased REM stage sleep.  We will have her see the sleep medicine physician to see if there are any other explanations for her symptoms.   Inez Catalina, MD

## 2023-09-06 ENCOUNTER — Ambulatory Visit (HOSPITAL_BASED_OUTPATIENT_CLINIC_OR_DEPARTMENT_OTHER): Payer: Medicaid Other | Admitting: Cardiology

## 2023-09-25 ENCOUNTER — Encounter: Payer: Self-pay | Admitting: Student

## 2023-12-13 ENCOUNTER — Ambulatory Visit: Payer: Medicaid Other | Admitting: Obstetrics and Gynecology

## 2023-12-13 ENCOUNTER — Other Ambulatory Visit (HOSPITAL_COMMUNITY)
Admission: RE | Admit: 2023-12-13 | Discharge: 2023-12-13 | Disposition: A | Discharge status: Home / Self Care | Source: Ambulatory Visit | Attending: Obstetrics and Gynecology | Admitting: Obstetrics and Gynecology

## 2023-12-13 ENCOUNTER — Encounter: Payer: Self-pay | Admitting: Obstetrics and Gynecology

## 2023-12-13 ENCOUNTER — Other Ambulatory Visit: Payer: Self-pay

## 2023-12-13 VITALS — BP 134/80 | HR 73 | Ht 64.0 in | Wt 200.0 lb

## 2023-12-13 DIAGNOSIS — Z3202 Encounter for pregnancy test, result negative: Secondary | ICD-10-CM | POA: Diagnosis not present

## 2023-12-13 DIAGNOSIS — N939 Abnormal uterine and vaginal bleeding, unspecified: Secondary | ICD-10-CM | POA: Diagnosis present

## 2023-12-13 DIAGNOSIS — Z01419 Encounter for gynecological examination (general) (routine) without abnormal findings: Secondary | ICD-10-CM | POA: Insufficient documentation

## 2023-12-13 LAB — POCT URINE PREGNANCY: Preg Test, Ur: NEGATIVE

## 2023-12-13 MED ORDER — MEGESTROL ACETATE 40 MG PO TABS
40.0000 mg | ORAL_TABLET | Freq: Two times a day (BID) | ORAL | 5 refills | Status: AC
  Filled 2023-12-13: qty 60, 30d supply, fill #0

## 2023-12-13 NOTE — Patient Instructions (Addendum)
 Birth Control Options Birth control is also called contraception. Birth control prevents pregnancy. There are many types of birth control. Work with your health care provider to find the best option for you. Birth control that uses hormones These types of birth control have hormones in them to prevent pregnancy. Birth control implant This is a small tube that is put into the skin of your arm. The tube can stay in for up to 3 years. Birth control shot These are shots you get every 3 months. Birth control pills This is a pill you take every day. You need to take it at the same time each day. Birth control patch This is a patch that you put on your skin. You change it 1 time each week for 3 weeks. After that, you take the patch off for 1 week. Vaginal ring  This is a soft plastic ring that you put in your vagina. The ring is left in for 3 weeks. Then, you take it out for 1 week. Then, you put a new ring in. Barrier methods  Female condom This is a thin covering that you put on the penis before sex. The condom is thrown away after sex. Female condom This is a soft, loose covering that you put in the vagina before sex. The condom is thrown away after sex. Diaphragm A diaphragm is a soft barrier that is shaped like a bowl. It must be made to fit your body. You put it in the vagina before sex with a chemical that kills sperm called spermicide. A diaphragm should be left in the vagina for 6-8 hours after sex and taken out within 24 hours. You need to replace a diaphragm: Every 1-2 years. After giving birth. After gaining more than 15 lb (6.8 kg). If you have surgery on your pelvis. Cervical cap This is a small, soft cup that fits over the cervix. The cervix is the lowest part of the uterus. It's put in the vagina before sex, along with spermicide. The cap must be made for you. The cap should be left in for 6-8 hours after sex. It is taken out within 48 hours. A cervical cap must be  prescribed and fit to your body by a provider. It should be replaced every 2 years. Sponge This is a small sponge that is put into the vagina before sex. It must be left in for at least 6 hours after sex. It must be taken out within 30 hours and thrown away. Spermicides These are chemicals that kill or stop sperm from getting into the uterus. They may be a pill, cream, jelly, or foam that you put into your vagina. They should be used at least 10-15 minutes before sex. Intrauterine device An intrauterine device (IUD) is a device that's put in the uterus by a provider. There are two types: Hormone IUD. This kind can stay in for 3-5 years. Copper IUD. This kind can stay in for 10 years. Permanent birth control Female tubal ligation This is surgery to block the fallopian tubes. Female sterilization This is a surgery, called a vasectomy, to tie off the tubes that carry sperm in men. This method takes 3 months to work. Other forms of birth control must be used for 3 months. Natural planning methods This means not having sex on the days the female partner could get pregnant. Here are some types of natural planning birth control: Using a calendar: To keep track of the length of each menstrual cycle. To  find out what days pregnancy can happen. To plan to not have sex on days when pregnancy can happen. Watching for signs of ovulation and not having sex during this time. The female partner can check for ovulation by keeping track of their temperature each day. They can also look for changes in the mucus that comes from the cervix. Where to find more information Centers for Disease Control and Prevention: TonerPromos.no. Then: Enter "birth control" in the search box. This information is not intended to replace advice given to you by your health care provider. Make sure you discuss any questions you have with your health care provider. Document Revised: 05/30/2023 Document Reviewed: 10/23/2022 Elsevier Patient  Education  2024 Elsevier Inc.Endometrial Ablation: What to Expect An endometrial ablation is a procedure that removes the thin layer of the lining of the uterus. This lining is called the endometrium. This procedure may be done to: Stop heavy periods that can cause low red blood cell count (anemia). Try to stop irregular bleeding from your vagina. Treat bleeding that's caused by small tumors (fibroids) in the uterus. After this procedure, you may not be able to have children. Tell a health care provider about: Any allergies you have. All medicines you're taking. These include vitamins, herbs, eye drops, creams, and over-the-counter medicines. Any problems you or family members have had with anesthesia. Any bleeding problems you have. Any surgeries you have had. Any medical conditions you have. Whether you're pregnant or may be pregnant. What are the risks? Your health care provider will talk with you about risks. These may include: Bleeding. Infection. Allergic reactions to medicines. Injury to the uterus, bowel, or nearby structures. An air bubble in the lung (air embolus). Problems with pregnancy. Scar tissue. What happens before the procedure? Medicines Ask about changing or stopping: Any medicines you take. Any vitamins, herbs, or supplements you take. Do not take aspirin or ibuprofen unless you're told to. Tests You will have tests of your uterus to make sure that there's no cancer. You may have an ultrasound of the uterus. Surgery safety For your safety, you may: Need to wash your skin with a soap that kills germs. Get antibiotics. Have hair removed at the procedure site. General instructions Do not smoke, vape, or use nicotine or tobacco. You may be given medicines to thin the lining of the uterus. If you'll be going home right after the procedure, plan to have a responsible adult: Drive you home from the hospital or clinic. You won't be allowed to drive. Stay with  you for the time you're told. What happens during the procedure?  You will lie on an exam table with your feet and legs supported. An IV will be inserted into one of your veins. You will be given a sedative to help you relax. A surgical tool with a light and camera (resectoscope) will be inserted into your vagina and moved into your uterus. This allows your surgeon to see inside your uterus. The lining of your uterus will be taken out. Your surgeon will use one of these methods: Radiofrequency. This uses an electric current to destroy the lining of the uterus. Cryotherapy. This uses something very cold to freeze the lining of the uterus. Heated fluid. This uses very hot salt and water solution (saline) solution to destroy the lining of the uterus. Microwave. This uses high-energy waves to heat up the lining of the uterus and destroy it. Thermal balloon. A balloon filled with heated fluid is inserted into the uterus.  The heated fluid destroys the lining of the uterus. These steps may vary. Ask what you can expect. What happens after the procedure? You may be watched closely until you leave. This includes checking your pain level, blood pressure, heart rate, and breathing rate. If you were given a sedative, do not drive or use machines until you're told it's safe. A sedative can make you sleepy. This information is not intended to replace advice given to you by your health care provider. Make sure you discuss any questions you have with your health care provider. Document Revised: 02/12/2023 Document Reviewed: 02/12/2023 Elsevier Patient Education  2024 [employer].

## 2023-12-13 NOTE — Progress Notes (Signed)
 Subjective:     Samantha [name] is a 48 y.o. female P1 with LMP 12/06/23 and BMI 34 who is here for a comprehensive physical exam. The patient reports abnormal uterine bleeding. She describes having two 5-day period per month, typically 2 weeks apart associated with passage of clots on day 2 and dysmenorrhea. She has been bleeding like this for the past 20 years. She has used depo-provera 20 years ago and onset of AUB seems to have occurred following discontinuation of depo-provera. She is sexually active without contraception or complaints. She denies pelvic pain or abnormal discharge. She reports a recent diagnosis of fibroid uterus. Patient is without any other complaints.   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   Depression    Phreesia 08/25/2020   Diabetes mellitus without complication (HCC)    Phreesia 08/25/2020   Hydradenitis    Hyperlipidemia    TIA (transient ischemic attack) 04/2015   questionable, most likely complicated migraine   Past Surgical History:  Procedure Laterality Date   CERVICAL BIOPSY  W/ LOOP ELECTRODE EXCISION     ESOPHAGOGASTRODUODENOSCOPY (EGD) WITH PROPOFOL N/A 10/14/2018   Procedure: ESOPHAGOGASTRODUODENOSCOPY (EGD) WITH PROPOFOL;  Surgeon: Charlott Rakes, MD;  Location: [facility];  Service: Endoscopy;  Laterality: N/A;   LEEP     Family History  Problem Relation Age of Onset   Hypertension Mother    Lupus Mother    Diabetes Father    Thyroid disease Sister    Diabetes Sister    Cancer Sister        stomach   Diabetes Maternal Grandmother    Anesthesia problems Neg Hx    Other Neg Hx    Migraines Neg Hx    Social History   Socioeconomic History   Marital status: Significant Other    Spouse name: Not on file   Number of children: 1   Years of education: 9   Highest education level: 10th grade  Occupational History   Not on file  Tobacco Use    Smoking status: Former    Current packs/day: 1.00    Average packs/day: 1 pack/day for 17.0 years (17.0 ttl pk-yrs)    Types: Cigars, Cigarettes   Smokeless tobacco: Never   Tobacco comments:    quit 02/08/2020  Vaping Use   Vaping status: Every Day  Substance and Sexual Activity   Alcohol use: Yes    Alcohol/week: 1.0 standard drink of alcohol    Types: 1 Shots of liquor per week    Comment: drink on the weekends   Drug use: Not Currently    Frequency: 3.0 times per week    Types: Marijuana   Sexual activity: Yes    Birth control/protection: None  Other Topics Concern   Not on file  Social History Narrative   Patient drinks sodas all day long.   Patient is right handed.    Social Drivers of Corporate investment banker Strain: Low Risk  (08/22/2022)   Overall Financial Resource Strain (CARDIA)    Difficulty of Paying Living Expenses: Not very hard  Recent Concern: Financial Resource Strain - Medium Risk (07/30/2022)   Overall Financial Resource Strain (CARDIA)    Difficulty of Paying Living Expenses: Somewhat hard  Food Insecurity: No Food Insecurity (08/22/2022)   Hunger Vital Sign    Worried About Running Out of Food in the Last  Year: Never true    Ran Out of Food in the Last Year: Never true  Transportation Needs: No Transportation Needs (08/22/2022)   PRAPARE - Administrator, Civil Service (Medical): No    Lack of Transportation (Non-Medical): No  Physical Activity: Sufficiently Active (10/24/2017)   Exercise Vital Sign    Days of Exercise per Week: 7 days    Minutes of Exercise per Session: 30 min  Stress: Stress Concern Present (10/24/2017)   [employer] of Occupational Health - Occupational Stress Questionnaire    Feeling of Stress : Rather much  Social Connections: Moderately Integrated (07/30/2022)   Social Connection and Isolation Panel [NHANES]    Frequency of Communication with Friends and Family: Twice a week    Frequency of Social  Gatherings with Friends and Family: Once a week    Attends Religious Services: 1 to 4 times per year    Active Member of Clubs or Organizations: No    Attends Banker Meetings: 1 to 4 times per year    Marital Status: Never married  Intimate Partner Violence: Not At Risk (07/30/2022)   Humiliation, Afraid, Rape, and Kick questionnaire    Fear of Current or Ex-Partner: No    Emotionally Abused: No    Physically Abused: No    Sexually Abused: No   Health Maintenance  Topic Date Due   Hepatitis C Screening  Never done   HPV VACCINES (2 - 3-dose SCDM series) 10/16/2017   Colonoscopy  Never done   COVID-19 Vaccine (1 - 2024-25 season) Never done   INFLUENZA VACCINE  04/10/2024   Cervical Cancer Screening (HPV/Pap Cotest)  10/03/2025   DTaP/Tdap/Td (2 - Td or Tdap) 09/19/2027   HIV Screening  Completed   Pneumococcal Vaccine 37-21 Years old  Aged Out       Review of Systems Pertinent items noted in HPI and remainder of comprehensive ROS otherwise negative.   Objective:  Blood pressure 134/80, pulse 73, height 5\' 4"  (1.626 m), weight 200 lb (90.7 kg), last menstrual period 12/06/2023.   GENERAL: Well-developed, well-nourished female in no acute distress.  HEENT: Normocephalic, atraumatic. Sclerae anicteric.  NECK: Supple. Normal thyroid.  LUNGS: Clear to auscultation bilaterally.  HEART: Regular rate and rhythm. BREASTS: Symmetric in size. No palpable masses or lymphadenopathy, skin changes, or nipple drainage. ABDOMEN: Soft, nontender, nondistended. No organomegaly. PELVIC: Normal external female genitalia. Vagina is pink and rugated.  Normal discharge. Normal appearing cervix. Uterus is normal in size. No adnexal mass or tenderness. Chaperone present during the pelvic exam EXTREMITIES: No cyanosis, clubbing, or edema, 2+ distal pulses.    05/2023 ultrasound FINDINGS: Uterus   Measurements: 9.3 x 5.0 x 7.1 cm = volume: 173 mL. Retroverted. Left fundal fibroid  measures 2.8 x 1.9 x 2.5 cm.   Endometrium   Thickness: 6 mm.  No focal abnormality visualized.   Right ovary   Measurements: 3.9 x 2.2 x 2.8 cm = volume: 13 mL. Normal appearance/no adnexal mass.   Left ovary   Measurements: 4.3 x 2.4 x 1.9 cm = volume: 10 mL. Normal appearance/no adnexal mass.   Other findings   No abnormal free fluid.   IMPRESSION: 1. Left fundal fibroid measures 2.8 cm. 2. Otherwise unremarkable pelvic ultrasound.     Electronically Signed   By: Duanne Guess D.O.   On: 05/28/2023 13:16 Assessment:    Healthy female exam.      Plan:    Pap smear collected Screening mammogram  ordered Health maintenance labs ordered Colonoscopy referral placed Discussed medical management with hormonal contraception or endometrial ablation pending results Discussed benefits of endometrial biopsy before starting therapy ENDOMETRIAL BIOPSY     The indications for endometrial biopsy were reviewed.   Risks of the biopsy including cramping, bleeding, infection, uterine perforation, inadequate specimen and need for additional procedures  were discussed. The patient states she understands and agrees to undergo procedure today. Consent was signed. Time out was performed. Urine HCG was negative. A sterile speculum was placed in the patient's vagina and the cervix was prepped with Betadine. A single-toothed tenaculum was placed on the anterior lip of the cervix to stabilize it. The uterine cavity was sounded to a depth of 9 cm using the uterine sound. The 3 mm pipelle was introduced into the endometrial cavity without difficulty, 2 passes were made.  A  moderate amount of tissue was  sent to pathology. The instruments were removed from the patient's vagina. Minimal bleeding from the cervix was noted. The patient tolerated the procedure well.  Routine post-procedure instructions were given to the patient. The patient will follow up in two weeks to review the results and for  further management.   Patient will be contacted with abnormal results Rx Megace provided in the meantime  See After Visit Summary for Counseling Recommendations

## 2023-12-13 NOTE — Progress Notes (Signed)
 48 y.o. New GYN presents for AEX/PAP/STD screening. C/o getting a period twice per Month lasting 5 days, blood clots, painful cramps 10/10.

## 2023-12-14 LAB — TSH: TSH: 0.811 u[IU]/mL (ref 0.450–4.500)

## 2023-12-14 LAB — HEPATITIS B SURFACE ANTIGEN: Hepatitis B Surface Ag: NEGATIVE

## 2023-12-14 LAB — COMPREHENSIVE METABOLIC PANEL WITH GFR
ALT: 11 IU/L (ref 0–32)
AST: 21 IU/L (ref 0–40)
Albumin: 4.2 g/dL (ref 3.9–4.9)
Alkaline Phosphatase: 100 IU/L (ref 44–121)
BUN/Creatinine Ratio: 14 (ref 9–23)
BUN: 12 mg/dL (ref 6–24)
Bilirubin Total: 0.3 mg/dL (ref 0.0–1.2)
CO2: 22 mmol/L (ref 20–29)
Calcium: 9.7 mg/dL (ref 8.7–10.2)
Chloride: 97 mmol/L (ref 96–106)
Creatinine, Ser: 0.88 mg/dL (ref 0.57–1.00)
Globulin, Total: 3.5 g/dL (ref 1.5–4.5)
Glucose: 86 mg/dL (ref 70–99)
Potassium: 4.4 mmol/L (ref 3.5–5.2)
Sodium: 134 mmol/L (ref 134–144)
Total Protein: 7.7 g/dL (ref 6.0–8.5)
eGFR: 82 mL/min/{1.73_m2} (ref >59)

## 2023-12-14 LAB — CBC
Hematocrit: 40.1 % (ref 34.0–46.6)
Hemoglobin: 12.7 g/dL (ref 11.1–15.9)
MCH: 27.1 pg (ref 26.6–33.0)
MCHC: 31.7 g/dL (ref 31.5–35.7)
MCV: 86 fL (ref 79–97)
Platelets: 380 10*3/uL (ref 150–450)
RBC: 4.68 x10E6/uL (ref 3.77–5.28)
RDW: 12.6 % (ref 11.7–15.4)
WBC: 6.2 10*3/uL (ref 3.4–10.8)

## 2023-12-14 LAB — LIPID PANEL
Chol/HDL Ratio: 3.3 ratio (ref 0.0–4.4)
Cholesterol, Total: 270 mg/dL — ABNORMAL HIGH (ref 100–199)
HDL: 82 mg/dL (ref >39)
LDL Chol Calc (NIH): 175 mg/dL — ABNORMAL HIGH (ref 0–99)
Triglycerides: 82 mg/dL (ref 0–149)
VLDL Cholesterol Cal: 13 mg/dL (ref 5–40)

## 2023-12-14 LAB — HEMOGLOBIN A1C
Est. average glucose Bld gHb Est-mCnc: 126 mg/dL
Hgb A1c MFr Bld: 6 % — ABNORMAL HIGH (ref 4.8–5.6)

## 2023-12-14 LAB — HIV ANTIBODY (ROUTINE TESTING W REFLEX): HIV Screen 4th Generation wRfx: NONREACTIVE

## 2023-12-14 LAB — HEPATITIS C ANTIBODY: Hep C Virus Ab: NONREACTIVE

## 2023-12-14 LAB — RPR: RPR Ser Ql: NONREACTIVE

## 2023-12-16 ENCOUNTER — Encounter: Payer: Self-pay | Admitting: Obstetrics and Gynecology

## 2023-12-16 LAB — CYTOLOGY - PAP
Adequacy: ABSENT
Comment: NEGATIVE
Diagnosis: NEGATIVE
High risk HPV: NEGATIVE

## 2023-12-16 LAB — CERVICOVAGINAL ANCILLARY ONLY
Chlamydia: NEGATIVE
Comment: NEGATIVE
Comment: NORMAL
Neisseria Gonorrhea: NEGATIVE

## 2023-12-16 LAB — SURGICAL PATHOLOGY

## 2023-12-24 ENCOUNTER — Other Ambulatory Visit: Payer: Self-pay

## 2024-03-17 ENCOUNTER — Ambulatory Visit
Admission: RE | Admit: 2024-03-17 | Discharge: 2024-03-17 | Disposition: A | Discharge status: Home / Self Care | Source: Ambulatory Visit | Attending: Obstetrics and Gynecology | Admitting: Obstetrics and Gynecology

## 2024-03-17 DIAGNOSIS — Z01419 Encounter for gynecological examination (general) (routine) without abnormal findings: Secondary | ICD-10-CM

## 2024-05-05 ENCOUNTER — Ambulatory Visit: Payer: Self-pay

## 2024-05-05 NOTE — Telephone Encounter (Signed)
 Call to patient to offer appointment for tomorrow at 3:45 PM.  Patient states will take.  Boil has healed.  States comes every 2 weeks or so.

## 2024-05-05 NOTE — Telephone Encounter (Signed)
 FYI Only or Action Required?: FYI only for provider.  Patient was last seen in primary care on 07/22/2023 by Francella Rogue, MD.  Called Nurse Triage reporting Recurrent Skin Infections.  Symptoms began several days ago.  Interventions attempted: Rest, hydration, or home remedies.  Symptoms are: unchanged.  Triage Disposition: See PCP When Office is Open (Within 3 Days)  Patient/caregiver understands and will follow disposition?: Yes  Summary: boil, right arm pit, green discharge   boil, right arm pit, green discharge     Reason for Disposition  [1] Boil AND [2] not improved > 3 days following Care Advice  Answer Assessment - Initial Assessment Questions 1. APPEARANCE of BOIL: What does the boil look like?      Patient endorses the area was swollen and red prior to popping. Patient endorses boil popped between 15 and 19. Endorses drainage that was green from her right arm.  2. LOCATION: Where is the boil located?      Right arm pit 3. NUMBER: How many boils are there?      1 4. SIZE: How big is the boil? (e.g., inches, cm; compare to size of a coin or other object)     Area is healed now. 5. ONSET: When did the boil start?     Started around August 9- 6. PAIN: Is there any pain? If Yes, ask: How bad is the pain?   (Scale 1-10; or mild, moderate, severe)     no 7. FEVER: Do you have a fever? If Yes, ask: What is it, how was it measured, and when did it start?      no 8. SOURCE: Have you been around anyone with boils or other Staph infections? Have you ever had boils before?     no 9. OTHER SYMPTOMS: Do you have any other symptoms? (e.g., shaking chills, weakness, rash elsewhere on body)     no  Protocols used: Boil (Skin Abscess)-A-AH

## 2024-05-06 ENCOUNTER — Encounter: Payer: Self-pay | Admitting: Student

## 2024-05-06 ENCOUNTER — Ambulatory Visit: Admitting: Student

## 2024-05-06 ENCOUNTER — Other Ambulatory Visit: Payer: Self-pay

## 2024-05-06 VITALS — BP 111/81 | HR 79 | Temp 98.5°F | Ht 64.0 in | Wt 203.0 lb

## 2024-05-06 DIAGNOSIS — E785 Hyperlipidemia, unspecified: Secondary | ICD-10-CM | POA: Diagnosis not present

## 2024-05-06 DIAGNOSIS — E7849 Other hyperlipidemia: Secondary | ICD-10-CM

## 2024-05-06 DIAGNOSIS — L732 Hidradenitis suppurativa: Secondary | ICD-10-CM | POA: Diagnosis present

## 2024-05-06 MED ORDER — CLINDAMYCIN PHOSPHATE 1 % EX SWAB
1.0000 | Freq: Two times a day (BID) | CUTANEOUS | 2 refills | Status: AC
  Filled 2024-05-06: qty 60, 30d supply, fill #0

## 2024-05-06 MED ORDER — ATORVASTATIN CALCIUM 40 MG PO TABS
40.0000 mg | ORAL_TABLET | Freq: Every day | ORAL | 11 refills | Status: AC
  Filled 2024-05-06: qty 30, 30d supply, fill #0
  Filled 2024-06-05: qty 30, 30d supply, fill #1
  Filled 2024-07-24: qty 30, 30d supply, fill #2
  Filled 2024-09-18: qty 30, 30d supply, fill #3

## 2024-05-06 MED ORDER — DOXYCYCLINE HYCLATE 100 MG PO TABS
100.0000 mg | ORAL_TABLET | Freq: Two times a day (BID) | ORAL | 0 refills | Status: AC
  Filled 2024-05-06: qty 14, 7d supply, fill #0

## 2024-05-06 NOTE — Progress Notes (Signed)
 CC: Bump on underarms  HPI:  [name] is a 48 y.o. female with a past medical history of hidradenitis suppurativa as well as hyperlipidemia presenting for acute visit concerning bumps under her underarms.  Please see assessment and plan for full HPI.  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   Depression    Phreesia 08/25/2020   Diabetes mellitus without complication (HCC)    Phreesia 08/25/2020   Hydradenitis    Hyperlipidemia    TIA (transient ischemic attack) 04/2015   questionable, most likely complicated migraine   Vaginal Pap smear, abnormal      Current Outpatient Medications:    doxycycline  (VIBRA -TABS) 100 MG tablet, Take 1 tablet (100 mg total) by mouth 2 (two) times daily., Disp: 14 tablet, Rfl: 0   albuterol  (VENTOLIN  HFA) 108 (90 Base) MCG/ACT inhaler, Inhale 2 puffs into the lungs every 4 (four) hours as needed for wheezing or shortness of breath., Disp: 6.7 g, Rfl: 0   aspirin  EC 81 MG tablet, Take 1 tablet (81 mg total) by mouth daily. Swallow whole., Disp: 30 tablet, Rfl: 12   atorvastatin  (LIPITOR) 40 MG tablet, Take 1 tablet (40 mg total) by mouth daily., Disp: 30 tablet, Rfl: 11   clindamycin  (CLEOCIN  T) 1 % SWAB, Apply 1 Swab topically 2 (two) times daily., Disp: 60 each, Rfl: 2   diazepam  (VALIUM ) 5 MG tablet, Take 0.5-1 tablets (2.5-5 mg total) by mouth at bedtime as needed for anxiety. (Patient not taking: Reported on 12/13/2023), Disp: 10 tablet, Rfl: 0   megestrol  (MEGACE ) 40 MG tablet, Take 1 tablet (40 mg total) by mouth 2 (two) times daily. Can increase to two tablets twice a day in the event of heavy bleeding, Disp: 60 tablet, Rfl: 5   metFORMIN  (GLUCOPHAGE ) 500 MG tablet, Take 1 tablet (500 mg total) by mouth 2 (two) times daily with a meal. (Patient not taking: Reported on 12/13/2023), Disp: 180 tablet, Rfl: 3   sertraline  (ZOLOFT ) 50 MG tablet, Take 1  tablet (50 mg total) by mouth daily. (Patient not taking: Reported on 12/13/2023), Disp: 30 tablet, Rfl: 1  Review of Systems:    Skin: Patient endorses bumps under her underarms  Physical Exam:  Vitals:   05/06/24 1540  BP: 111/81  Pulse: 79  Temp: 98.5 F (36.9 C)  TempSrc: Oral  SpO2: 99%  Weight: 203 lb (92.1 kg)  Height: 5' 4 (1.626 m)   General: Patient is sitting comfortably in the room  Head: Normocephalic, atraumatic  Skin: Bilateral indurated boils noted to bilateral axillary area, no obvious drainage appreciated      Assessment & Plan:   Hidradenitis suppurativa Patient reports that she has many episodes where she has bumps that develop on her underarms.  Sometimes they are in the groin, but mainly in the underarms.  She has previously been diagnosed with hidradenitis suppurativa.  Previously has been referred to dermatology, but has not went.  Previously has been treated with clindamycin  topical ointment as well as doxycycline .  This has worked for her in the past.  Given disease burden, will refer back to dermatology.  Plan: - Start doxycycline  100 mg twice daily for neck 7 days - Continue clindamycin  cream - Refer to dermatology  Hyperlipidemia Most recent lipid panel in April 2025 showing total cholesterol greater than 200 as well as LDL at 170.  This is not at goal.  Per charting patient has had a stroke previously.  Goal would be less than 70.  She has not been taking a statin.  Will restart her atorvastatin  40 mg daily.  Plan: - Check lipid panel in 6 months - Continue atorvastatin  40 mg daily  Patient discussed with Dr. Trudy Libby Blanch, DO Internal Medicine Resident PGY-3

## 2024-05-06 NOTE — Assessment & Plan Note (Signed)
 Patient reports that she has many episodes where she has bumps that develop on her underarms.  Sometimes they are in the groin, but mainly in the underarms.  She has previously been diagnosed with hidradenitis suppurativa.  Previously has been referred to dermatology, but has not went.  Previously has been treated with clindamycin  topical ointment as well as doxycycline .  This has worked for her in the past.  Given disease burden, will refer back to dermatology.  Plan: - Start doxycycline  100 mg twice daily for neck 7 days - Continue clindamycin  cream - Refer to dermatology

## 2024-05-06 NOTE — Assessment & Plan Note (Addendum)
 Most recent lipid panel in April 2025 showing total cholesterol greater than 200 as well as LDL at 170.  This is not at goal.  Per charting patient has had a stroke previously.  Goal would be less than 70.  She has not been taking a statin.  Will restart her atorvastatin  40 mg daily.  Plan: - Check lipid panel in 6 months - Continue atorvastatin  40 mg daily

## 2024-05-06 NOTE — Patient Instructions (Signed)
 Samantha [name],Thank you for allowing me to take part in your care today.  Here are your instructions.  1.  I sent you some doxycycline .  Also clindamycin  cream.  Please follow instructions.  2.  I am referring you to dermatology.   PLEASE BRING YOUR MEDICATIONS TO EVERY APPOINTMENT  Thank you, Dr. Tobie  If you have any other questions please contact the internal medicine clinic at [phone] If it is after hours, please call the [facility] at [phone] and then ask the person who picks up for the resident on call.

## 2024-05-07 ENCOUNTER — Ambulatory Visit: Payer: Self-pay | Admitting: Student

## 2024-05-07 ENCOUNTER — Other Ambulatory Visit: Payer: Self-pay

## 2024-05-14 ENCOUNTER — Ambulatory Visit: Payer: Self-pay

## 2024-05-14 NOTE — Telephone Encounter (Signed)
 Pt stated the one breast is much larger than the other; very painful to touch. She has to wear a bra at all times. Last mammogram was 03/2024. Stated she can feel something in her breast. We have no available appts - CRM scheduled her an appt at [facility] tomorrow @ 1400PM.

## 2024-05-14 NOTE — Telephone Encounter (Signed)
 FYI Only or Action Required?: FYI only for provider.  Patient was last seen in primary care on 05/06/2024 by Tobie Gaines, DO.  Called Nurse Triage reporting Breast Problem.  Symptoms began several weeks ago.  Interventions attempted: Rest, hydration, or home remedies.  Symptoms are: unchanged.  Triage Disposition: See Physician Within 24 Hours  Patient/caregiver understands and will follow disposition?: Yes     Copied from CRM #[id number]. Topic: Clinical - Red Word Triage >> May 14, 2024  2:06 PM Carrielelia G wrote: Red Word that prompted transfer to Nurse Triage: lump right breast, painful Reason for Disposition  [1] Breast looks infected (spreading redness, feels hot or painful to touch) AND [2] no fever  Answer Assessment - Initial Assessment Questions 1. SYMPTOM: What's the main symptom you're concerned about?  (e.g., lump, nipple discharge, pain, rash)     R lump, tenderness 2. LOCATION: Where is the sx located?     R breast 3. ONSET: When did sx  start?     3 weeks, worsened over the last week 4. PRIOR HISTORY: Do you have any history of prior problems with your breasts? (e.g., breast cancer, breast implant, fibrocystic breast disease)     Endorses hx of 2 areas on scan 5. CAUSE: What do you think is causing this symptom?     unknown 6. OTHER SYMPTOMS: Do you have any other symptoms? (e.g., breast pain, fever, nipple discharge, redness or rash)     Tenderness/pain, hot to touch, larger than other breast, 7. PREGNANCY-BREASTFEEDING: Is there any chance you are pregnant? When was your last menstrual period? Are you breastfeeding?     N/a    [facility] attempted to schedule with PCP, but no access. Scheduled with [facility] instead.  Protocols used: Breast Symptoms-A-AH

## 2024-05-15 ENCOUNTER — Other Ambulatory Visit: Payer: Self-pay

## 2024-05-15 ENCOUNTER — Ambulatory Visit
Admission: RE | Admit: 2024-05-15 | Discharge: 2024-05-15 | Disposition: A | Discharge status: Home / Self Care | Payer: Self-pay | Source: Ambulatory Visit | Attending: Family Medicine | Admitting: Family Medicine

## 2024-05-15 VITALS — BP 94/73 | HR 73 | Temp 98.8°F | Resp 15

## 2024-05-15 DIAGNOSIS — N611 Abscess of the breast and nipple: Secondary | ICD-10-CM | POA: Diagnosis not present

## 2024-05-15 MED ORDER — CLINDAMYCIN HCL 300 MG PO CAPS
300.0000 mg | ORAL_CAPSULE | Freq: Three times a day (TID) | ORAL | 0 refills | Status: AC
  Filled 2024-05-15: qty 21, 7d supply, fill #0

## 2024-05-15 NOTE — ED Provider Notes (Signed)
 [facility]    CSN: [id number] Arrival date & time: 05/15/24  1342      History   Chief Complaint Chief Complaint  Patient presents with   Appointment    HPI Samantha [name] is a 48 y.o. female.   Patient is here for right breast tenderness/pain.  Has been going on x 4 weeks but worse the last 2.  The breast appears more swollen compared to the left;  Painful to move, brush against it.  She thinks it may be more red to the lateral aspect.  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   Adjustment disorder with mixed anxiety and depressed mood 06/26/2016   Hidradenitis suppurativa 11/07/2015   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   Tobacco use 05/04/2015   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HCC) 04/22/2015   Hyperlipidemia 04/22/2015   TOA (tubo-ovarian abscess) 11/23/2012   History of abnormal Pap smear 12/20/2011   History of PID 12/20/2011    Past Surgical History:  Procedure Laterality Date   CERVICAL BIOPSY  W/ LOOP ELECTRODE EXCISION     ESOPHAGOGASTRODUODENOSCOPY (EGD) WITH PROPOFOL  N/A 10/14/2018   Procedure: ESOPHAGOGASTRODUODENOSCOPY (EGD) WITH PROPOFOL ;  Surgeon: Dianna Specking, MD;  Location: [facility];  Service: Endoscopy;  Laterality: N/A;   LEEP      OB History     Gravida  2   Para  1   Term  1   Preterm      AB  1   Living  1       SAB  1   IAB      Ectopic      Multiple      Live Births               Home Medications    Prior to Admission medications   Medication Sig Start Date End Date Taking? Authorizing Provider  albuterol  (VENTOLIN  HFA) 108 (90 Base) MCG/ACT inhaler Inhale 2 puffs into the lungs every 4 (four) hours as needed for wheezing or shortness of breath. 06/12/22   Taniya Dasher, Rocky, MD  aspirin  EC 81 MG tablet Take 1 tablet (81 mg total) by mouth daily. Swallow whole. 07/13/22   Teresa Carrier, MD  atorvastatin  (LIPITOR) 40 MG tablet Take 1 tablet (40 mg total) by mouth daily. 05/06/24   Tobie Gaines, DO  clindamycin  (CLEOCIN  T) 1 % SWAB Apply 1 Swab topically 2 (two) times daily. 05/06/24   Tobie Gaines, DO  diazepam  (VALIUM ) 5 MG tablet Take 0.5-1 tablets (2.5-5 mg total) by mouth at bedtime as needed for anxiety. Patient not taking: Reported on 12/13/2023 08/29/22   Trine Raynell Moder, MD  doxycycline  (VIBRA -TABS) 100 MG tablet Take 1 tablet (100 mg total) by mouth 2 (two) times daily. 05/06/24   Tobie Gaines, DO  megestrol  (MEGACE ) 40 MG  tablet Take 1 tablet (40 mg total) by mouth 2 (two) times daily. Can increase to two tablets twice a day in the event of heavy bleeding 12/13/23   Constant, Peggy, MD  metFORMIN  (GLUCOPHAGE ) 500 MG tablet Take 1 tablet (500 mg total) by mouth 2 (two) times daily with a meal. Patient not taking: Reported on 12/13/2023 10/19/22   Gawaluck, Greylon, MD  sertraline  (ZOLOFT ) 50 MG tablet Take 1 tablet (50 mg total) by mouth daily. Patient not taking: Reported on 12/13/2023 04/22/23   Kandis Perkins, DO    Family History Family History  Problem Relation Age of Onset   Cancer Mother    Hypertension Mother    Lupus Mother    Diabetes Father    Thyroid  disease Sister    Diabetes Sister    Cancer Sister        stomach   Diabetes Maternal Grandmother    Anesthesia problems Neg Hx    Other Neg Hx    Migraines Neg Hx     Social History Social History   Tobacco Use    Smoking status: Former    Current packs/day: 1.00    Average packs/day: 1 pack/day for 17.0 years (17.0 ttl pk-yrs)    Types: Cigars, Cigarettes   Smokeless tobacco: Current   Tobacco comments:    quit 02/08/2020  Vaping Use   Vaping status: Every Day  Substance Use Topics   Alcohol use: Yes    Alcohol/week: 1.0 standard drink of alcohol    Types: 1 Shots of liquor per week    Comment: drink on the weekends   Drug use: Not Currently    Frequency: 3.0 times per week    Types: Marijuana     Allergies   Patient has no known allergies.   Review of Systems Review of Systems  Constitutional: Negative.   HENT: Negative.    Respiratory: Negative.    Cardiovascular: Negative.   Gastrointestinal: Negative.   Genitourinary: Negative.      Physical Exam Triage Vital Signs ED Triage Vitals  Encounter Vitals Group     BP 05/15/24 1403 94/73     Girls Systolic BP Percentile --      Girls Diastolic BP Percentile --      Boys Systolic BP Percentile --      Boys Diastolic BP Percentile --      Pulse Rate 05/15/24 1403 73     Resp 05/15/24 1403 15     Temp 05/15/24 1403 98.8 F (37.1 C)     Temp Source 05/15/24 1403 Oral     SpO2 05/15/24 1403 97 %     Weight --      Height --      Head Circumference --      Peak Flow --      Pain Score 05/15/24 1402 10     Pain Loc --      Pain Education --      Exclude from Growth Chart --    No data found.  Updated Vital Signs BP 94/73 (BP Location: Left Arm)   Pulse 73   Temp 98.8 F (37.1 C) (Oral)   Resp 15   LMP 05/08/2024 (Approximate)   SpO2 97%   Visual Acuity Right Eye Distance:   Left Eye Distance:   Bilateral Distance:    Right Eye Near:   Left Eye Near:    Bilateral Near:     Physical Exam Constitutional:      Appearance:  Normal appearance. She is normal weight.  Chest:     Comments: Chaperone is present;  Normal left breast;  The right lateral breast does appear swollen compared to the right;  there is  about a golf ball sized area just lateral to the right nipple that is firm, and tender;  no abscess formation to the skin noted Neurological:     General: No focal deficit present.     Mental Status: She is alert.  Psychiatric:        Mood and Affect: Mood normal.      UC Treatments / Results  Labs (all labs ordered are listed, but only abnormal results are displayed) Labs Reviewed - No data to display  EKG   Radiology No results found.  Procedures Procedures (including critical care time)  Medications Ordered in UC Medications - No data to display  Initial Impression / Assessment and Plan / UC Course  I have reviewed the triage vital signs and the nursing notes.  Pertinent labs & imaging results that were available during my care of the patient were reviewed by me and considered in my medical decision making (see chart for details).   Final Clinical Impressions(s) / UC Diagnoses   Final diagnoses:  Breast abscess     Discharge Instructions      You were seen today for possible abscess to the breast.  I have sent out an antibiotic to treat this today.  If you have worsening pain, or develop fevers or chills despite the medication, then please go to the ER for further evaluation.  Regardless, follow up with your primary care provider as well.     ED Prescriptions     Medication Sig Dispense Auth. Provider   clindamycin  (CLEOCIN ) 300 MG capsule Take 1 capsule (300 mg total) by mouth 3 (three) times daily for 7 days. 21 capsule Darral Longs, MD      PDMP not reviewed this encounter.   Darral Longs, MD 05/15/24 [phone]

## 2024-05-15 NOTE — Discharge Instructions (Signed)
 You were seen today for possible abscess to the breast.  I have sent out an antibiotic to treat this today.  If you have worsening pain, or develop fevers or chills despite the medication, then please go to the ER for further evaluation.  Regardless, follow up with your primary care provider as well.

## 2024-05-15 NOTE — ED Triage Notes (Signed)
 Pt reports for 4 weeks right breast gotten larger and more painful. Reports feels like there is a knot inside her breast and gotten worse over past two weeks. Denies redness and or drainage.

## 2024-05-15 NOTE — Progress Notes (Signed)
 Internal Medicine Clinic Attending  Case discussed with the resident at the time of the visit.  We reviewed the resident's history and exam and pertinent patient test results.  I agree with the assessment, diagnosis, and plan of care documented in the resident's note.

## 2024-06-12 ENCOUNTER — Other Ambulatory Visit: Payer: Self-pay

## 2024-07-29 ENCOUNTER — Other Ambulatory Visit: Payer: Self-pay

## 2024-12-09 ENCOUNTER — Ambulatory Visit: Admitting: Physician Assistant
